# Patient Record
Sex: Female | Born: 1935 | Race: White | Hispanic: No | Smoking: Former smoker
Health system: Southern US, Community
[De-identification: ages and names within clinical notes are randomized; demographics above are authoritative.]

## PROBLEM LIST (undated history)

## (undated) DIAGNOSIS — K922 Gastrointestinal hemorrhage, unspecified: Secondary | ICD-10-CM

## (undated) DIAGNOSIS — E8881 Metabolic syndrome: Secondary | ICD-10-CM

## (undated) DIAGNOSIS — J302 Other seasonal allergic rhinitis: Secondary | ICD-10-CM

## (undated) DIAGNOSIS — Z9289 Personal history of other medical treatment: Secondary | ICD-10-CM

## (undated) DIAGNOSIS — E785 Hyperlipidemia, unspecified: Secondary | ICD-10-CM

## (undated) DIAGNOSIS — R51 Headache: Secondary | ICD-10-CM

## (undated) DIAGNOSIS — I447 Left bundle-branch block, unspecified: Secondary | ICD-10-CM

## (undated) DIAGNOSIS — R911 Solitary pulmonary nodule: Secondary | ICD-10-CM

## (undated) DIAGNOSIS — K219 Gastro-esophageal reflux disease without esophagitis: Secondary | ICD-10-CM

## (undated) DIAGNOSIS — I519 Heart disease, unspecified: Secondary | ICD-10-CM

## (undated) DIAGNOSIS — Z950 Presence of cardiac pacemaker: Secondary | ICD-10-CM

## (undated) DIAGNOSIS — I428 Other cardiomyopathies: Secondary | ICD-10-CM

## (undated) DIAGNOSIS — E88819 Insulin resistance, unspecified: Secondary | ICD-10-CM

## (undated) DIAGNOSIS — Z8679 Personal history of other diseases of the circulatory system: Secondary | ICD-10-CM

## (undated) DIAGNOSIS — I517 Cardiomegaly: Secondary | ICD-10-CM

## (undated) DIAGNOSIS — M1990 Unspecified osteoarthritis, unspecified site: Secondary | ICD-10-CM

## (undated) DIAGNOSIS — I10 Essential (primary) hypertension: Secondary | ICD-10-CM

## (undated) DIAGNOSIS — I639 Cerebral infarction, unspecified: Secondary | ICD-10-CM

## (undated) HISTORY — DX: Hyperlipidemia, unspecified: E78.5

## (undated) HISTORY — DX: Solitary pulmonary nodule: R91.1

## (undated) HISTORY — DX: Metabolic syndrome: E88.81

## (undated) HISTORY — DX: Other seasonal allergic rhinitis: J30.2

## (undated) HISTORY — PX: INSERT / REPLACE / REMOVE PACEMAKER: SUR710

## (undated) HISTORY — DX: Left bundle-branch block, unspecified: I44.7

## (undated) HISTORY — PX: CHOLECYSTECTOMY: SHX55

## (undated) HISTORY — DX: Heart disease, unspecified: I51.9

## (undated) HISTORY — PX: JOINT REPLACEMENT: SHX530

## (undated) HISTORY — DX: Essential (primary) hypertension: I10

## (undated) HISTORY — DX: Gastro-esophageal reflux disease without esophagitis: K21.9

## (undated) HISTORY — DX: Cerebral infarction, unspecified: I63.9

## (undated) HISTORY — DX: Other cardiomyopathies: I42.8

## (undated) HISTORY — DX: Cardiomegaly: I51.7

## (undated) HISTORY — DX: Headache: R51

## (undated) HISTORY — PX: OTHER SURGICAL HISTORY: SHX169

## (undated) HISTORY — DX: Insulin resistance, unspecified: E88.819

## (undated) HISTORY — PX: CARDIOVASCULAR STRESS TEST: SHX262

## (undated) HISTORY — DX: Personal history of other diseases of the circulatory system: Z86.79

## (undated) HISTORY — DX: Personal history of other medical treatment: Z92.89

## (undated) HISTORY — DX: Unspecified osteoarthritis, unspecified site: M19.90

## (undated) HISTORY — DX: Gastrointestinal hemorrhage, unspecified: K92.2

---
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
## 1961-03-17 HISTORY — PX: ABDOMINAL HYSTERECTOMY: SHX81

## 1998-07-08 ENCOUNTER — Emergency Department (HOSPITAL_COMMUNITY): Admission: EM | Admit: 1998-07-08 | Discharge: 1998-07-08 | Payer: Self-pay | Admitting: Emergency Medicine

## 1998-07-16 ENCOUNTER — Emergency Department (HOSPITAL_COMMUNITY): Admission: EM | Admit: 1998-07-16 | Discharge: 1998-07-16 | Payer: Self-pay | Admitting: Emergency Medicine

## 2000-03-17 ENCOUNTER — Encounter: Payer: Self-pay | Admitting: Emergency Medicine

## 2000-03-17 ENCOUNTER — Emergency Department (HOSPITAL_COMMUNITY): Admission: EM | Admit: 2000-03-17 | Discharge: 2000-03-17 | Payer: Self-pay | Admitting: Emergency Medicine

## 2000-04-03 ENCOUNTER — Ambulatory Visit (HOSPITAL_COMMUNITY): Admission: RE | Admit: 2000-04-03 | Discharge: 2000-04-03 | Payer: Self-pay | Admitting: *Deleted

## 2000-04-03 ENCOUNTER — Encounter: Payer: Self-pay | Admitting: *Deleted

## 2005-08-05 ENCOUNTER — Emergency Department (HOSPITAL_COMMUNITY): Admission: EM | Admit: 2005-08-05 | Discharge: 2005-08-05 | Payer: Self-pay | Admitting: Emergency Medicine

## 2009-08-14 ENCOUNTER — Emergency Department (HOSPITAL_BASED_OUTPATIENT_CLINIC_OR_DEPARTMENT_OTHER)
Admission: EM | Admit: 2009-08-14 | Discharge: 2009-08-14 | Payer: Self-pay | Source: Home / Self Care | Admitting: Emergency Medicine

## 2009-08-14 ENCOUNTER — Ambulatory Visit: Payer: Self-pay | Admitting: Diagnostic Radiology

## 2010-07-09 ENCOUNTER — Ambulatory Visit: Payer: Self-pay | Admitting: Internal Medicine

## 2011-05-28 ENCOUNTER — Encounter: Payer: Self-pay | Admitting: Family Medicine

## 2011-05-28 ENCOUNTER — Ambulatory Visit (INDEPENDENT_AMBULATORY_CARE_PROVIDER_SITE_OTHER): Payer: PRIVATE HEALTH INSURANCE | Admitting: Family Medicine

## 2011-05-28 VITALS — BP 175/83 | HR 87 | Temp 98.3°F | Ht 60.0 in | Wt 148.1 lb

## 2011-05-28 DIAGNOSIS — Z23 Encounter for immunization: Secondary | ICD-10-CM

## 2011-05-28 DIAGNOSIS — I10 Essential (primary) hypertension: Secondary | ICD-10-CM

## 2011-05-28 DIAGNOSIS — E119 Type 2 diabetes mellitus without complications: Secondary | ICD-10-CM

## 2011-05-28 MED ORDER — TETANUS-DIPHTH-ACELL PERTUSSIS 5-2.5-18.5 LF-MCG/0.5 IM SUSP: 0.5000 mL | Freq: Once | INTRAMUSCULAR | Status: DC

## 2011-05-29 ENCOUNTER — Other Ambulatory Visit (INDEPENDENT_AMBULATORY_CARE_PROVIDER_SITE_OTHER): Payer: PRIVATE HEALTH INSURANCE

## 2011-05-29 DIAGNOSIS — E119 Type 2 diabetes mellitus without complications: Secondary | ICD-10-CM

## 2011-05-29 DIAGNOSIS — I10 Essential (primary) hypertension: Secondary | ICD-10-CM

## 2011-05-29 LAB — COMPREHENSIVE METABOLIC PANEL
ALT: 13 U/L (ref 0–35)
Albumin: 4.3 g/dL (ref 3.5–5.2)
CO2: 28 mEq/L (ref 19–32)
Calcium: 9.3 mg/dL (ref 8.4–10.5)
Chloride: 104 mEq/L (ref 96–112)
Creatinine, Ser: 0.9 mg/dL (ref 0.4–1.2)
GFR: 61.58 mL/min (ref >60.00)
Potassium: 4.3 mEq/L (ref 3.5–5.1)

## 2011-05-29 LAB — LIPID PANEL
Cholesterol: 208 mg/dL — ABNORMAL HIGH (ref 0–200)
Total CHOL/HDL Ratio: 3

## 2011-05-29 LAB — TSH: TSH: 2.18 u[IU]/mL (ref 0.35–5.50)

## 2011-05-29 LAB — CBC WITH DIFFERENTIAL/PLATELET
Basophils Absolute: 0 10*3/uL (ref 0.0–0.1)
Eosinophils Absolute: 0.3 10*3/uL (ref 0.0–0.7)
Lymphocytes Relative: 29.1 % (ref 12.0–46.0)
MCHC: 33.1 g/dL (ref 30.0–36.0)
Monocytes Absolute: 0.5 10*3/uL (ref 0.1–1.0)
Neutro Abs: 4.6 10*3/uL (ref 1.4–7.7)
Neutrophils Relative %: 61 % (ref 43.0–77.0)
RDW: 14 % (ref 11.5–14.6)

## 2011-05-30 ENCOUNTER — Encounter: Payer: Self-pay | Admitting: Family Medicine

## 2011-05-31 ENCOUNTER — Encounter: Payer: Self-pay | Admitting: Family Medicine

## 2011-05-31 DIAGNOSIS — I10 Essential (primary) hypertension: Secondary | ICD-10-CM | POA: Insufficient documentation

## 2011-05-31 NOTE — Progress Notes (Signed)
Office Note 05/31/2011  CC:  Chief Complaint  Patient presents with  . Establish Care    new patient    HPI:  Madison White is a 79 y.o. White female who is here to establish care. Patient's most recent primary MD: Dr. Dorothe Pea at [facility]. Old records were not reviewed prior to or during today's visit, except for a couple of typed pages that the patient brought--she wrote down some facts about her allergies, some mention of meds, some glucose levels but no mention of any dates..  Pt here today with one acute complaint: some focal pain on bottom of right foot, hx of callus in this area that has been shaved down before.  No other feet pain or sensation abnormality.  Actually, she goes on to say that it has been hurting lately but the last 2d has stopped hurting completely.  Patient spent large majority of today's visit talking about her distrust of her daughter in law, says she is a "sociopath" and Pt wants to be sure we never let her get medical info from [place] about her, says she has impersonated others in the past just to do this.  She perseverates about this, tells multiple stories that outline some of the things the daughter in law has done, says it has really caused her a lot of stress but she thinks that overall she is dealing with it pretty well.    Past Medical History  Diagnosis Date  . Diabetes mellitus     unclear per pt: awaiting old records as of 05/28/11  . HTN (hypertension)   . Hyperlipidemia   . GERD (gastroesophageal reflux disease)   . History of rheumatic fever   . History of blood transfusion   . Seasonal allergies   . Osteoarthritis     Past Surgical History  Procedure Date  . Cesarean section X 5    One of her neonates died soon after birth  . Strabismus surgery 1939  . Tonsillectomy 1938  . Abdominal hysterectomy 1963    At the time of her last C/S; says she had partial bladder resection at that time as well    Family History  Problem Relation Age of  Onset  . Hypertension Mother   . Heart disease Mother   . Hyperlipidemia Mother   . Diabetes Mother   . Heart disease Father     History   Social History  . Marital Status: Widowed    Spouse Name: N/A    Number of Children: N/A  . Years of Education: N/A   Occupational History  . Not on file.   Social History Main Topics  . Smoking status: Former Smoker    Quit date: 05/31/1958  . Smokeless tobacco: Never Used  . Alcohol Use: Not on file  . Drug Use: Not on file  . Sexually Active: Not on file   Other Topics Concern  . Not on file   Social History Narrative   Widower, husband died around 59 (wrongful death per pt's report).Originally from [place], has been in [place] since about 1997.Lives alone.  Former Airline pilot and lay pastor-retired.  Enjoys Engineer, materials.Fairly active and independent.Distant hx of tobacco abuse.  No alcohol or drugs.    MEDS: Centrum silver MVI qd, Vit B12 po qd, Vit D 400 IU qd, Ocuvite, omega 3 tabs. Her records she brought mention toprol XL, avapro, and simvastatin but she does not list these as current meds when discussing things with me or  the nurse.    Allergies  Allergen Reactions  . Lisinopril Swelling    Swelling around eyes; also says it caused increased sugar and BP  . Penicillins Hives  . Sulfa Antibiotics Rash    ROS Review of Systems  Constitutional: Negative for fever and fatigue.  HENT: Negative for congestion and sore throat.   Eyes: Negative for visual disturbance.  Respiratory: Negative for cough.   Cardiovascular: Negative for chest pain.  Gastrointestinal: Negative for nausea and abdominal pain.  Genitourinary: Negative for dysuria.  Musculoskeletal: Positive for arthralgias (about 74mo of left knee pain that spontaneously resolved recently per pt). Negative for back pain and joint swelling.  Skin: Negative for rash.  Neurological: Negative for weakness and headaches.  Hematological: Negative for adenopathy.     PE; Blood pressure 175/83, pulse 87, temperature 98.3 F (36.8 C), temperature source Temporal, height 5' (1.524 m), weight 148 lb 1.9 oz (67.187 kg), SpO2 95.00%. Gen: Alert, well appearing.  Patient is oriented to person, place, time, and situation. ENT: Ears: EACs clear, normal epithelium.  TMs with good light reflex and landmarks bilaterally.  Eyes: no injection, icteris, swelling, or exudate.  EOMI, PERRLA. Nose: no drainage or turbinate edema/swelling.  No injection or focal lesion.  Mouth: lips without lesion/swelling.  Oral mucosa pink and moist.  Dentition intact and without obvious caries or gingival swelling.  Oropharynx without erythema, exudate, or swelling.  Neck - No masses or thyromegaly or limitation in range of motion CV: RRR, no m/r/g.   LUNGS: CTA bilat, nonlabored resps, good aeration in all lung fields. ABD: soft, NT, ND, BS normal.  No hepatospenomegaly or mass.  No bruits. EXT: no clubbing, cyanosis, or edema.  Right foot: callus noted at area of 4th metatarsal head, no obvious sign of plantar wart beneath this.  Nontender to touch.  No erythema, blanching, or fluctuance.  Pertinent labs:  None today  ASSESSMENT AND PLAN:   HTN (hypertension), benign History and treatment unclear, patient not able to clarify today. Will obtain old records, have her check home bps and will obtain fasting labs (lipid panel, CMET, CBC, TSH, and HbA1c) ASAP. She declined flu vaccine today. She was agreeable to Tdap, pneumovax, and zostavax but we never got to this b/c of her perseveration regarding her daughter in law.    Spent 45 min with pt today and >50% of this time was spent in counseling and addressing her concerns about her stress dealing with her daughter in law. Hopefully, upon her return we can further clarify med treatment for HTN and hyperlipidemia that is confusing in her "home" records.  Return for lab visit for fasting labs ASAP; o/v with me in 1 wk.

## 2011-05-31 NOTE — Assessment & Plan Note (Signed)
History and treatment unclear, patient not able to clarify today. Will obtain old records, have her check home bps and will obtain fasting labs (lipid panel, CMET, CBC, TSH, and HbA1c) ASAP. She declined flu vaccine today. She was agreeable to Tdap, pneumovax, and zostavax but we never got to this b/c of her perseveration regarding her daughter in law.

## 2011-06-06 ENCOUNTER — Encounter: Payer: Self-pay | Admitting: Family Medicine

## 2011-06-06 ENCOUNTER — Ambulatory Visit (INDEPENDENT_AMBULATORY_CARE_PROVIDER_SITE_OTHER): Payer: PRIVATE HEALTH INSURANCE | Admitting: Family Medicine

## 2011-06-06 VITALS — BP 157/82 | HR 87 | Temp 98.4°F | Ht 60.0 in | Wt 149.0 lb

## 2011-06-06 DIAGNOSIS — I10 Essential (primary) hypertension: Secondary | ICD-10-CM

## 2011-06-06 NOTE — Patient Instructions (Signed)
Your goal blood pressure is <140 on top and <90 on bottom. Check your bp at home 3 times per week over the next 58mo. Bring these back for review in my office If persistently > 170 on top or >110 on bottom, return or call before your 58mo f/u.

## 2011-06-06 NOTE — Progress Notes (Signed)
OFFICE VISIT  06/10/2011   CC:  Chief Complaint  Patient presents with  . Follow-up    DM, HTN     HPI:    Patient is a 78 y.o. Caucasian female who presents for f/u HTN. Labs done last week were all normal, including HbA1c 5.8%. Tried to discuss past dx/treatment of htn with pt but it was hard to get any details out of her b/c she kept repeating the same 2-3 stories she told last visit about a "sociopath" daughter in law, a "wrongful death" case involving her husband, and a distressing accusation by a psychiatrist that she was making everything up.  She was able to reiterate that she does think that stress of dealing with her daughter in law has made her bp go up, but she didn't have any explanation how she got put on meds for bp at some point in the past. Her reactions to these meds are unclear, but seem to be ? Hyperglycemia? And/or rash.  Also, she is convinced that many medications that are not sulfa drugs have sulfa in them and she is therefore very apprehensive about the thought of having to take them.   Past Medical History  Diagnosis Date  . Diabetes mellitus     unclear per pt: awaiting old records as of 05/28/11  . HTN (hypertension)   . Hyperlipidemia   . GERD (gastroesophageal reflux disease)   . History of rheumatic fever   . History of blood transfusion   . Seasonal allergies   . Osteoarthritis     Past Surgical History  Procedure Date  . Cesarean section X 5    One of her neonates died soon after birth  . Strabismus surgery 1939  . Tonsillectomy 1938  . Abdominal hysterectomy 1963    At the time of her last C/S; says she had partial bladder resection at that time as well    Outpatient Prescriptions Prior to Visit  Medication Sig Dispense Refill  . cholecalciferol (VITAMIN D) 400 UNITS TABS Take by mouth.      . Cyanocobalamin (VITAMIN B-12 CR PO) Take by mouth.      . fish oil-omega-3 fatty acids 1000 MG capsule Take 2 g by mouth daily.      . Multiple  Vitamins-Minerals (CENTRUM PO) Take by mouth.      . multivitamin-lutein (OCUVITE-LUTEIN) CAPS Take 1 capsule by mouth daily.        Allergies  Allergen Reactions  . Avapro (Irbesartan) Other (See Comments)    Unknown rxn  . Lisinopril Swelling    Swelling around eyes; also says it caused increased sugar and BP  . Penicillins Hives  . Sulfa Antibiotics Rash    ROS As per HPI  PE: Blood pressure 157/82, pulse 87, temperature 98.4 F (36.9 C), temperature source Temporal, height 5' (1.524 m), weight 149 lb (67.586 kg). Gen: Alert, well appearing.  Patient is oriented to person, place, time, and situation. Affect: pleasant. No further exam today.  LABS:  Lab Results  Component Value Date   TSH 2.18 05/29/2011   Lab Results  Component Value Date   WBC 7.5 05/29/2011   HGB 14.7 05/29/2011   HCT 44.5 05/29/2011   MCV 89.3 05/29/2011   PLT 302.0 05/29/2011   Lab Results  Component Value Date   CREATININE 0.9 05/29/2011   BUN 13 05/29/2011   NA 142 05/29/2011   K 4.3 05/29/2011   CL 104 05/29/2011   CO2 28 05/29/2011  Lab Results  Component Value Date   ALT 13 05/29/2011   AST 24 05/29/2011   ALKPHOS 43 05/29/2011   BILITOT 0.3 05/29/2011   Lab Results  Component Value Date   CHOL 208* 05/29/2011   Lab Results  Component Value Date   HDL 70.40 05/29/2011   No results found for this basename: [facility]   Lab Results  Component Value Date   TRIG 93.0 05/29/2011   Lab Results  Component Value Date   CHOLHDL 3 05/29/2011   No results found for this basename: PSA       IMPRESSION AND PLAN:  HTN (hypertension), benign Discussed HTN thoroughly today and the importance of correctly diagnosing it and correctly treating it. Decided to do home bp monitoring with logging of these numbers with HR and bring them in to next f/u visit in a couple of weeks. No meds started at this time. Still awaiting records from prior PCP.     FOLLOW UP: Return in about 3 months (around  09/06/2011).

## 2011-06-10 NOTE — Assessment & Plan Note (Addendum)
Discussed HTN thoroughly today and the importance of correctly diagnosing it and correctly treating it. Decided to do home bp monitoring with logging of these numbers with HR and bring them in to next f/u visit. BP goal written down for her and parameters for early call back or return were clearly delineated for her. No meds started at this time. Still awaiting records from prior PCP.

## 2011-07-07 ENCOUNTER — Telehealth: Payer: Self-pay | Admitting: Family Medicine

## 2011-07-07 NOTE — Telephone Encounter (Signed)
Left a message for patient to return my call. 

## 2011-07-07 NOTE — Telephone Encounter (Signed)
Patient advised and appt made for 07-14-11

## 2011-07-07 NOTE — Telephone Encounter (Signed)
Patient is old enough that some time in the next couple of weeks at her convenience we should probably check her bp and get an EKG, as long as she is asymptomatic, (not feeling palpitations, sob, passing out etc), she can choose to wait for Dr Milinda Cave or come in and see me

## 2011-07-14 ENCOUNTER — Ambulatory Visit (INDEPENDENT_AMBULATORY_CARE_PROVIDER_SITE_OTHER): Payer: Medicare Other | Admitting: Family Medicine

## 2011-07-14 ENCOUNTER — Encounter: Payer: Self-pay | Admitting: Family Medicine

## 2011-07-14 VITALS — BP 142/72 | HR 90 | Ht 60.0 in | Wt 149.0 lb

## 2011-07-14 DIAGNOSIS — I10 Essential (primary) hypertension: Secondary | ICD-10-CM

## 2011-07-14 NOTE — Progress Notes (Signed)
OFFICE NOTE  07/14/2011  CC:  Chief Complaint  Patient presents with  . Irregular Heart Beat    seen on home BP monitor     HPI:   Patient is a 78 y.o. Caucasian female who is here for c/o "irregular heart rate". She has been monitoring bp and HR at home several times per week since I last saw her about 5 wks ago. I reviewed these today and most systolics are mildly elevated and most diastolics are 70s-80s (one was 95). Still under a lot of stress with family situations, just started vigorous exercise regimen, also paying more attention to heart healthy diet. Denies CP, palpitations, SOB, dizziness, HA, or LE swelling.    Pertinent PMH:  Past Medical History  Diagnosis Date  . Diabetes mellitus     unclear per pt: awaiting old records as of 05/28/11  . HTN (hypertension)   . Hyperlipidemia   . GERD (gastroesophageal reflux disease)   . History of rheumatic fever   . History of blood transfusion   . Seasonal allergies   . Osteoarthritis     MEDS;   Outpatient Prescriptions Prior to Visit  Medication Sig Dispense Refill  . cholecalciferol (VITAMIN D) 400 UNITS TABS Take by mouth.      . Cyanocobalamin (VITAMIN B-12 CR PO) Take by mouth.      . fish oil-omega-3 fatty acids 1000 MG capsule Take 2 g by mouth daily.      . Multiple Vitamins-Minerals (CENTRUM PO) Take by mouth.      . multivitamin-lutein (OCUVITE-LUTEIN) CAPS Take 1 capsule by mouth daily.        PE: Blood pressure 142/72, pulse 90, height 5' (1.524 m), weight 149 lb (67.586 kg), SpO2 97.00%. Gen: Alert, well appearing.  Patient is oriented to person, place, time, and situation. CV: Heart sounds distant but no murmur, rub, or gallop appreciated.  S1 and S2 are fine.  Rhythm regular.  Pulses symmetric.  LUNGS: CTA bilat, nonlabored resps, good aeration in all lung fields.   IMPRESSION AND PLAN:  HTN (hypertension), benign She insists on trying TLC/wt loss for 54mo before med trial, even though I warned  her that her bp was likely too high for TLC to bring it back into normal range.  She's apprehensive about possible drug rxn b/c of past experiences. Monitor blood pressure every other day.  Your goal BP is <140/90. If you have several bp's in a row that are greater than 165 on top or >110 on bottom then call the office or return prior to next scheduled appt.      FOLLOW UP:  Return in about 3 months (around 10/13/2011) for f/u HTN.

## 2011-07-14 NOTE — Patient Instructions (Signed)
Monitor blood pressure every other day.  Your goal BP is <140/90. If you have several bp's in a row that are greater than 165 on top or >110 on bottom then call the office or return prior to next scheduled appt.

## 2011-07-14 NOTE — Assessment & Plan Note (Addendum)
She insists on trying TLC/wt loss for 69mo before med trial, even though I warned her that her bp was likely too high for TLC to bring it back into normal range.  She's apprehensive about possible drug rxn b/c of past experiences. Monitor blood pressure every other day.  Your goal BP is <140/90. If you have several bp's in a row that are greater than 165 on top or >110 on bottom then call the office or return prior to next scheduled appt.

## 2011-07-20 ENCOUNTER — Encounter: Payer: Self-pay | Admitting: Family Medicine

## 2011-08-14 HISTORY — PX: CATARACT EXTRACTION: SUR2

## 2011-09-05 ENCOUNTER — Ambulatory Visit: Payer: PRIVATE HEALTH INSURANCE | Admitting: Family Medicine

## 2011-09-08 ENCOUNTER — Other Ambulatory Visit: Payer: Self-pay | Admitting: Family Medicine

## 2011-09-08 ENCOUNTER — Ambulatory Visit (INDEPENDENT_AMBULATORY_CARE_PROVIDER_SITE_OTHER): Payer: Medicare Other | Admitting: Family Medicine

## 2011-09-08 ENCOUNTER — Encounter: Payer: Self-pay | Admitting: Family Medicine

## 2011-09-08 VITALS — BP 173/83 | HR 81 | Ht 60.0 in | Wt 152.0 lb

## 2011-09-08 DIAGNOSIS — I10 Essential (primary) hypertension: Secondary | ICD-10-CM

## 2011-09-08 DIAGNOSIS — Z23 Encounter for immunization: Secondary | ICD-10-CM

## 2011-09-08 NOTE — Assessment & Plan Note (Signed)
Her home readings support isolated systolic HTN, rare high diastolic. She is once again averse to med trial at this time and wants to try diet and exercise for 3 mo first. I told her this was fine as long as she understands the risk of stroke and other CV disease that she may have from untreated HTN. She expressed understanding of this.  We went over the DASH diet and I gave handout of it today.

## 2011-09-08 NOTE — Progress Notes (Signed)
OFFICE VISIT  09/08/2011   CC:  Chief Complaint  Patient presents with  . Follow-up     HPI:    Patient is a 78 y.o. Caucasian female who presents for  3 mo f/u HTN.  She is currently on no meds and was supposed to do lifestyle mod x 36mo b/c this is what she wanted to try--has had bad experience with a couple of meds in the past. BP "erratic" at home.  Reviewed log of bp's she brought in today and it shows systolic typically 140s to 170s and diastolics 60s-70s, rarely over 90.  Heart rate 60s-80s.   Callus problem on bottom of right foot has prevented her from getting started with regular physical exercise.  She says her podiatrist, Dr. Forest Becker, shaved this twice since I saw her last.  She joined [employer] and won a poetry contest there.  She describes more stress in her life lately b/c her daughter has moved back in with her.  She has a good relationship with her daughter but it is still more stress to live with someone.  ROS: no CP, no palpitations, no dizziness, no neck or back pain.  Neck "popped" when she was put on the table for her cataract surgery recently, but she denies pain in the neck, denies UE paresthesias or weakness.  Past Medical History  Diagnosis Date  . Insulin resistance     A1c's excellent per old records (6.1 in 2008 and 2009)  . HTN (hypertension)     hx of refusing treatment  . Hyperlipidemia     hx of refusing treatment  . GERD (gastroesophageal reflux disease)   . History of rheumatic fever   . History of blood transfusion   . Seasonal allergies   . Osteoarthritis     Past Surgical History  Procedure Date  . Cesarean section X 5    One of her neonates died soon after birth  . Strabismus surgery 1939  . Tonsillectomy 1938  . Abdominal hysterectomy 1963    At the time of her last C/S; says she had partial bladder resection at that time as well  . Cardiovascular stress test 08/2005    Low risk scan  . Cataract extraction 08/14/11    both      Outpatient Prescriptions Prior to Visit  Medication Sig Dispense Refill  . Cyanocobalamin (VITAMIN B-12 CR PO) Take by mouth.      . fish oil-omega-3 fatty acids 1000 MG capsule Take 2 g by mouth daily.      . Multiple Vitamins-Minerals (CENTRUM PO) Take by mouth.      . cholecalciferol (VITAMIN D) 400 UNITS TABS Take by mouth.      . multivitamin-lutein (OCUVITE-LUTEIN) CAPS Take 1 capsule by mouth daily.        Allergies  Allergen Reactions  . Avapro (Irbesartan) Other (See Comments)    Unknown rxn  . Lisinopril Swelling    Swelling around eyes; also says it caused increased sugar and BP  . Penicillins Hives  . Sulfa Antibiotics Rash    ROS As per HPI  PE: Blood pressure 173/83, pulse 81, height 5' (1.524 m), weight 152 lb (68.947 kg). Gen: Alert, well appearing.  Patient is oriented to person, place, time, and situation. Neck: supple/nontender.  No LAD, mass, or TM.  Carotid pulses 2+ bilaterally, without bruits. CV: RRR, no m/r/g.   LUNGS: CTA bilat, nonlabored resps, good aeration in all lung fields. ABD: soft, NT/ND, no bruit. EXT:  no clubbing, cyanosis, or edema.   LABS:  Lab Results  Component Value Date   WBC 7.5 05/29/2011   HGB 14.7 05/29/2011   HCT 44.5 05/29/2011   MCV 89.3 05/29/2011   PLT 302.0 05/29/2011     Chemistry      Component Value Date/Time   NA 142 05/29/2011 0810   K 4.3 05/29/2011 0810   CL 104 05/29/2011 0810   CO2 28 05/29/2011 0810   BUN 13 05/29/2011 0810   CREATININE 0.9 05/29/2011 0810      Component Value Date/Time   CALCIUM 9.3 05/29/2011 0810   ALKPHOS 43 05/29/2011 0810   AST 24 05/29/2011 0810   ALT 13 05/29/2011 0810   BILITOT 0.3 05/29/2011 0810     Lab Results  Component Value Date   CHOL 208* 05/29/2011   HDL 70.40 05/29/2011   LDLDIRECT 117.9 05/29/2011   TRIG 93.0 05/29/2011   CHOLHDL 3 05/29/2011   Lab Results  Component Value Date   HGBA1C 5.8 05/29/2011     IMPRESSION AND PLAN:  HTN (hypertension), benign Her  home readings support isolated systolic HTN, rare high diastolic. She is once again averse to med trial at this time and wants to try diet and exercise for 3 mo first. I told her this was fine as long as she understands the risk of stroke and other CV disease that she may have from untreated HTN. She expressed understanding of this.  We went over the DASH diet and I gave handout of it today.    FOLLOW UP: Return in about 3 months (around 12/09/2011) for morning appt so she can get fasting labs done after seeing me.

## 2011-09-08 NOTE — Addendum Note (Signed)
Addended by: Luisa Dago on: 09/08/2011 02:07 PM   Modules accepted: Orders

## 2011-12-08 ENCOUNTER — Ambulatory Visit: Payer: Medicare Other | Admitting: Family Medicine

## 2011-12-16 DIAGNOSIS — I447 Left bundle-branch block, unspecified: Secondary | ICD-10-CM

## 2011-12-16 HISTORY — DX: Left bundle-branch block, unspecified: I44.7

## 2011-12-18 ENCOUNTER — Ambulatory Visit (INDEPENDENT_AMBULATORY_CARE_PROVIDER_SITE_OTHER): Payer: Medicare Other | Admitting: Family Medicine

## 2011-12-18 ENCOUNTER — Encounter: Payer: Self-pay | Admitting: Family Medicine

## 2011-12-18 VITALS — BP 151/75 | HR 78 | Ht 60.0 in | Wt 149.0 lb

## 2011-12-18 DIAGNOSIS — R002 Palpitations: Secondary | ICD-10-CM

## 2011-12-18 DIAGNOSIS — I10 Essential (primary) hypertension: Secondary | ICD-10-CM

## 2011-12-18 DIAGNOSIS — I447 Left bundle-branch block, unspecified: Secondary | ICD-10-CM | POA: Insufficient documentation

## 2011-12-18 NOTE — Assessment & Plan Note (Signed)
New dx, asymptomatic. I have spoken to [facility] today and they will call the pt ASAP for an appt. I will fax my note from today and her EKG to their office.

## 2011-12-18 NOTE — Progress Notes (Signed)
OFFICE NOTE  12/18/2011  CC:  Chief Complaint  Patient presents with  . Follow-up    hypertension     HPI: Patient is a 78 y.o. Caucasian female who is here for 4 mo f/u HTN.  She has been resistant to med use for this condition.  Has put it off twice in order to try to do TLC and see if bp would come down. She has noted normal bps when consistently exercising.  She notes an abnormal rhythm when she lies down to go to sleep during the evening.  Just the past couple of nights.  No CP or SOB. Under more stress lately due to her daughter living with her.   Pertinent PMH:  Past Medical History  Diagnosis Date  . Insulin resistance     A1c's excellent per old records (6.1 in 2008 and 2009)  . HTN (hypertension)     hx of refusing treatment  . Hyperlipidemia     hx of refusing treatment  . GERD (gastroesophageal reflux disease)   . History of rheumatic fever   . History of blood transfusion   . Seasonal allergies   . Osteoarthritis     MEDS:  Outpatient Prescriptions Prior to Visit  Medication Sig Dispense Refill  . Calcium Carbonate-Vitamin D (CALCIUM 600 + D PO) Take 1 tablet by mouth daily.      . Cyanocobalamin (VITAMIN B-12 CR PO) Take by mouth.      . fish oil-omega-3 fatty acids 1000 MG capsule Take 2 g by mouth daily.      . Multiple Vitamins-Minerals (CENTRUM PO) Take by mouth.      . BESIVANCE 0.6 % SUSP Place 1 drop into the left eye Twice daily.      . Bromfenac Sodium (PROLENSA) 0.07 % SOLN Place 1 drop into the right eye daily.      . DUREZOL 0.05 % EMUL Place 1 drop into the left eye Twice daily.        PE: Blood pressure 151/75, pulse 78, height 5' (1.524 m), weight 149 lb (67.586 kg). Gen: Alert, well appearing.  Patient is oriented to person, place, time, and situation. CV: Regular with frequent pauses/ectopy vs irreg rhythm, no murmur/rub/gallop LUNGS: CTA bilat, nonlabored resps. EXT: no clubbing, cyanosis, or edema.   12 LEAD EKG: sinus rhythm, 1st  degree A-V block, LBBB and left axis.  No old EKG for comparison.  IMPRESSION AND PLAN:  Left bundle branch block New dx, asymptomatic. I have spoken to [facility] today and they will call the pt ASAP for an appt. I will fax my note from today and her EKG to their office.  HTN (hypertension), benign A bit up here today as usual but pt says normal at home with regular exercise. She has been adamantly opposed to meds in the past. Continue with TLC.     FOLLOW UP: 1 mo

## 2011-12-18 NOTE — Assessment & Plan Note (Signed)
A bit up here today as usual but pt says normal at home with regular exercise. She has been adamantly opposed to meds in the past. Continue with TLC.

## 2012-01-13 HISTORY — PX: TRANSTHORACIC ECHOCARDIOGRAM: SHX275

## 2012-01-19 ENCOUNTER — Encounter: Payer: Self-pay | Admitting: Family Medicine

## 2012-01-21 ENCOUNTER — Encounter: Payer: Self-pay | Admitting: Family Medicine

## 2012-06-06 ENCOUNTER — Encounter: Payer: Self-pay | Admitting: *Deleted

## 2012-06-17 ENCOUNTER — Encounter: Payer: Self-pay | Admitting: Family Medicine

## 2012-06-17 ENCOUNTER — Ambulatory Visit (INDEPENDENT_AMBULATORY_CARE_PROVIDER_SITE_OTHER): Payer: Medicare Other | Admitting: Family Medicine

## 2012-06-17 VITALS — BP 140/78 | HR 88 | Ht 60.0 in | Wt 156.0 lb

## 2012-06-17 DIAGNOSIS — R5381 Other malaise: Secondary | ICD-10-CM

## 2012-06-17 DIAGNOSIS — R5383 Other fatigue: Secondary | ICD-10-CM

## 2012-06-17 DIAGNOSIS — I10 Essential (primary) hypertension: Secondary | ICD-10-CM

## 2012-06-17 LAB — CBC WITH DIFFERENTIAL/PLATELET
Basophils Relative: 0.7 % (ref 0.0–3.0)
Eosinophils Relative: 3 % (ref 0.0–5.0)
Hemoglobin: 14.6 g/dL (ref 12.0–15.0)
Lymphocytes Relative: 27.5 % (ref 12.0–46.0)
MCHC: 33.8 g/dL (ref 30.0–36.0)
MCV: 87.7 fl (ref 78.0–100.0)
Monocytes Absolute: 0.7 10*3/uL (ref 0.1–1.0)
Neutro Abs: 5.4 10*3/uL (ref 1.4–7.7)
Neutrophils Relative %: 61.1 % (ref 43.0–77.0)
RBC: 4.94 Mil/uL (ref 3.87–5.11)
WBC: 8.9 10*3/uL (ref 4.5–10.5)

## 2012-06-17 LAB — BASIC METABOLIC PANEL
BUN: 11 mg/dL (ref 6–23)
Chloride: 103 mEq/L (ref 96–112)
Creatinine, Ser: 0.8 mg/dL (ref 0.4–1.2)
Glucose, Bld: 95 mg/dL (ref 70–99)
Potassium: 4.1 mEq/L (ref 3.5–5.1)

## 2012-06-17 MED ORDER — ASPIRIN 81 MG PO TABS: 81.0000 mg | ORAL_TABLET | Freq: Every day | ORAL | Status: DC

## 2012-06-17 NOTE — Patient Instructions (Signed)
Start 81mg  Aspirin once every day with food. Continue your amlodipine for blood pressure control. For itching, buy OTC allegra 60mg  and take 1 twice a day as needed.

## 2012-06-18 ENCOUNTER — Encounter: Payer: Self-pay | Admitting: Family Medicine

## 2012-06-18 NOTE — Progress Notes (Signed)
OFFICE NOTE  06/18/2012  CC:  Chief Complaint  Patient presents with  . Follow-up    HTN     HPI: Patient is a 79 y.o. Caucasian female who is here for routine HTN f/u. Amlodipine started by cardiologist per pt.   She thinks it is making her itch all over, but her daughters are with her today and they remind her she complained of itching a lot before starting the med.  She says systolics are mildly elevated at home sometimes, sometimes normal. Has not been exercising any lately. She has had a hx of being quite emotionally/mentally resistant to the thought of treating her HTN with meds.  ROS: chronic fatigue.  No CP, SOB, palpitations, or dizziness.  No swelling of lips, tongue, or throat.  No rash.  Pertinent PMH:  Past Medical History  Diagnosis Date  . Insulin resistance     A1c's excellent per old records (6.1 in 2008 and 2009)  . HTN (hypertension)     hx of refusing treatment  . Hyperlipidemia     hx of refusing treatment  . GERD (gastroesophageal reflux disease)   . History of rheumatic fever   . History of blood transfusion   . Seasonal allergies   . Osteoarthritis   . Left bundle branch block 12/2011    Dr. Royann Shivers at [facility] H&V; ECHO and myocardial perfusion scan showed  septal and apical wall motion abnormality but she had no significant valvular disease and no ischemia.  She did have mildly decreased EF (39% on lexiscan and 50% on echo) and abnl LV relaxation.  Mild amount of PVCs.  Pt at higher risk for other conduction abnormalities.  [name] LVH (left ventricular hypertrophy)   . Diastolic dysfunction, left ventricle     MEDS:  Outpatient Prescriptions Prior to Visit  Medication Sig Dispense Refill  . Calcium Carbonate-Vitamin D (CALCIUM 600 + D PO) Take 1 tablet by mouth daily.      . Cyanocobalamin (VITAMIN B-12 CR PO) Take by mouth.      . fish oil-omega-3 fatty acids 1000 MG capsule Take 2 g by mouth daily.      . Multiple Vitamins-Minerals (CENTRUM PO) Take by  mouth.       No facility-administered medications prior to visit.    PE: Blood pressure 140/78, pulse 88, height 5' (1.524 m), weight 156 lb (70.761 kg). Gen: Alert, well appearing.  Patient is oriented to person, place, time, and situation. CV: RRR, soft systolic murmur, no diastolic murmur.  S1 and S2 clear. LUNGS: CTA bilat, nonlabored resps. EXT: Trace bilat LE edema in ankles SKIN: lots of benign age-associated lesions like actinic keratoses, cherry angiomata, lentigo macules. No nevi that appear to be atypical.  IMPRESSION AND PLAN:  HTN: working on it.  She is slowly accepting the thought of med management.  Keep going with amlodipine 5mg  qd and keep f/u with cardiology.  Continue home bp monitoring. I recommended she restart ASA 81mg  qd. Encouraged pt to restart exercise. Check BMET and CBC today. An After Visit Summary was printed and given to the patient.  Spent 30 min with pt today, with >50% of this time spent in counseling and care coordination regarding the above problems.  FOLLOW UP: 1 mo

## 2012-07-05 ENCOUNTER — Encounter: Payer: Self-pay | Admitting: Cardiovascular Disease

## 2012-07-19 ENCOUNTER — Ambulatory Visit (INDEPENDENT_AMBULATORY_CARE_PROVIDER_SITE_OTHER): Payer: Medicare Other | Admitting: Family Medicine

## 2012-07-19 ENCOUNTER — Encounter: Payer: Self-pay | Admitting: Family Medicine

## 2012-07-19 VITALS — BP 138/76 | HR 83 | Temp 98.2°F | Resp 14 | Wt 155.5 lb

## 2012-07-19 DIAGNOSIS — I10 Essential (primary) hypertension: Secondary | ICD-10-CM

## 2012-07-19 NOTE — Patient Instructions (Addendum)
Your blood pressure goal is age top number <140 and avg bottom number <90.  Call for persistent top number > 160, or bottom number >100.

## 2012-07-19 NOTE — Progress Notes (Signed)
OFFICE NOTE  07/19/2012  CC:  Chief Complaint  Patient presents with  . Follow-up    1-mth [HTN]     HPI: Patient is a 79 y.o. Caucasian female who is here for 1 mo f/u HTN.   Says bp was up at her cardiology f/u visit recently but was 147/74 same day at home bp check.  Home checks show avg syst  140s, diast 70s. Denies any HA, fatigue, CP, palpitations, or edema.  Pertinent PMH:  Past Medical History  Diagnosis Date  . Insulin resistance     A1c's excellent per old records (6.1 in 2008 and 2009)  . HTN (hypertension)     hx of refusing treatment  . Hyperlipidemia     hx of refusing treatment  . GERD (gastroesophageal reflux disease)   . History of rheumatic fever   . History of blood transfusion   . Seasonal allergies   . Osteoarthritis   . Left bundle branch block 12/2011    Dr. Royann Shivers at [facility] H&V; ECHO and myocardial perfusion scan showed  septal and apical wall motion abnormality but she had no significant valvular disease and no ischemia.  She did have mildly decreased EF (39% on lexiscan and 50% on echo) and abnl LV relaxation.  Mild amount of PVCs.  Pt at higher risk for other conduction abnormalities.  [name] LVH (left ventricular hypertrophy)   . Diastolic dysfunction, left ventricle     MEDS:  Outpatient Prescriptions Prior to Visit  Medication Sig Dispense Refill  . amLODipine (NORVASC) 5 MG tablet Take 1 tablet by mouth daily.      [name] aspirin 81 MG tablet Take 1 tablet (81 mg total) by mouth daily.  30 tablet  1  . Cyanocobalamin (VITAMIN B-12 CR PO) Take by mouth.      . fish oil-omega-3 fatty acids 1000 MG capsule Take 2 g by mouth daily.      . Multiple Vitamins-Minerals (CENTRUM PO) Take by mouth.      . Calcium Carbonate-Vitamin D (CALCIUM 600 + D PO) Take 1 tablet by mouth daily.       No facility-administered medications prior to visit.    PE: Blood pressure 138/76, pulse 83, temperature 98.2 F (36.8 C), temperature source Oral, resp. rate 14, weight  155 lb 8 oz (70.534 kg), SpO2 95.00%. Gen: Alert, well appearing.  Patient is oriented to person, place, time, and situation. CV: RRR, no m/r/g.  Occ ectopic beat.  Distant S1 and S2. LUNGS: CTA bilat, nonlabored resps, good aeration in all lung fields.   IMPRESSION AND PLAN:  1) HTN, stable.  Continue amlodipine 5mg  qd.  Continue ASA 81mg  qd.  FOLLOW UP: 40mo

## 2012-08-26 ENCOUNTER — Ambulatory Visit: Payer: Medicare Other | Admitting: Cardiovascular Disease

## 2012-09-02 ENCOUNTER — Ambulatory Visit (INDEPENDENT_AMBULATORY_CARE_PROVIDER_SITE_OTHER): Payer: Medicare Other | Admitting: Cardiovascular Disease

## 2012-09-02 ENCOUNTER — Encounter: Payer: Self-pay | Admitting: Cardiovascular Disease

## 2012-09-02 VITALS — BP 162/90 | HR 66 | Resp 20 | Ht 60.0 in | Wt 157.1 lb

## 2012-09-02 DIAGNOSIS — I10 Essential (primary) hypertension: Secondary | ICD-10-CM

## 2012-09-02 DIAGNOSIS — I447 Left bundle-branch block, unspecified: Secondary | ICD-10-CM

## 2012-09-02 DIAGNOSIS — E785 Hyperlipidemia, unspecified: Secondary | ICD-10-CM

## 2012-09-02 NOTE — Assessment & Plan Note (Signed)
She has borderline elevated total cholesterol, slightly elevated LDL cholesterol, but has excellent HDL cholesterol. I do not think pharmacological therapy is needed. Her promise to pursue rigorous physical exercise and lose weight will probably improve these numbers. In a similar fashion her borderline hyperglycemia should also get better. I have asked her to come back in 6 months so that we can analyze the results of her labor.

## 2012-09-02 NOTE — Patient Instructions (Signed)
Your physician recommends that you schedule a follow-up appointment in: 6 months Your physician discussed the importance of regular exercise and recommended that you start or continue a regular exercise program for good health. Your physician encouraged you to lose weight for better health. Sodium-Controlled Diet Sodium is a mineral. It is found in many foods. Sodium may be found naturally or added during the making of a food. The most common form of sodium is salt, which is made up of sodium and chloride. Reducing your sodium intake involves changing your eating habits. The following guidelines will help you reduce the sodium in your diet:  Stop using the salt shaker.  Use salt sparingly in cooking and baking.  Substitute with sodium-free seasonings and spices.  Do not use a salt substitute (potassium chloride) without your caregiver's permission.  Include a variety of fresh, unprocessed foods in your diet.  Limit the use of processed and convenience foods that are high in sodium. USE THE FOLLOWING FOODS SPARINGLY: Breads/Starches  Commercial bread stuffing, commercial pancake or waffle mixes, coating mixes. Waffles. Croutons. Prepared (boxed or frozen) potato, rice, or noodle mixes that contain salt or sodium. Salted [place] fries or hash browns. Salted popcorn, breads, crackers, chips, or snack foods. Vegetables  Vegetables canned with salt or prepared in cream, butter, or cheese sauces. Sauerkraut. Tomato or vegetable juices canned with salt.  Fresh vegetables are allowed if rinsed thoroughly. Fruit  Fruit is okay to eat. Meat and Meat Substitutes  Salted or smoked meats, such as bacon or Canadian bacon, chipped or corned beef, hot dogs, salt pork, luncheon meats, pastrami, ham, or sausage. Canned or smoked fish, poultry, or meat. Processed cheese or cheese spreads, blue or Roquefort cheese. Battered or frozen fish products. Prepared spaghetti sauce. Baked beans. Reuben sandwiches.  Salted nuts. Caviar. Milk  Limit buttermilk to 1 cup per week. Soups and Combination Foods  Bouillon cubes, canned or dried soups, broth, consomm. Convenience (frozen or packaged) dinners with more than 600 mg sodium. Pot pies, pizza, Asian food, fast food cheeseburgers, and specialty sandwiches. Desserts and Sweets  Regular (salted) desserts, pie, commercial fruit snack pies, commercial snack cakes, canned puddings.  Eat desserts and sweets in moderation. Fats and Oils  Gravy mixes or canned gravy. No more than 1 to 2 tbs of salad dressing. Chip dips.  Eat fats and oils in moderation. Beverages  See those listed under the vegetables and milk groups. Condiments  Ketchup, mustard, meat sauces, salsa, regular (salted) and lite soy sauce or mustard. Dill pickles, olives, meat tenderizer. Prepared horseradish or pickle relish. Dutch-processed cocoa. Baking powder or baking soda used medicinally. Worcestershire sauce. "Light" salt. Salt substitute, unless approved by your caregiver. Document Released: 08/23/2001 Document Revised: 05/26/2011 Document Reviewed: 03/26/2009 [facility] Patient Information 2014 [place], [place].

## 2012-09-02 NOTE — Assessment & Plan Note (Signed)
Mrs. Madison White makes a strong argument for "white coat hypertension" is the major reason for her elevated blood pressure. Her blood pressure recordings at home (with a monitor that we have tested in the office) do show frequent systolic blood pressure in the 140-150 mm Hg range, but have never recorded severely elevated blood pressure as we do in the clinic. Mrs. Madison White agrees that when she was walking on a daily basis weight less than today her blood pressure was completely normal. She promises to return to a program of daily exercise, reduce her sodium intake and try to lose some weight. I think this will go a long way towards "curing" her elevated blood pressure. I do not see a compelling reason to keep her on antihypertensive medications especially since she believes these are causing a variety of side effects. I have asked her to continue recording her blood pressure with her home monitor and to send me periodic recordings.

## 2012-09-02 NOTE — Assessment & Plan Note (Signed)
No significant structural heart disease by previous workup with echo and nuclear stress testing.

## 2012-09-02 NOTE — Progress Notes (Signed)
Patient ID: Madison White, female   DOB: 1928/12/07, 79 y.o.   MRN: [id number]      Reason for office visit HTN, hyperlipidemia  Madison White returns today to discuss the need for treatment of high blood pressure and hyperlipidemia medications. She brought her home blood pressure monitor and be tested it against our office sphygmomanometer. It is an accurate device. She has several recordings of her blood pressure at home in the device memory and a typical systolic blood pressures between 135 and 147 mm Hg. The diastolic blood pressures consistently normal. This would indeed confirm her contention that she has "white coat hypertension" for the most part.  She is convinced that many of the medications that we have given her have caused allergic reactions and she is terrified by the complications that she had the 2 allergic reactions to sulfa and penicillin in her youth. While I am not convinced that she is truly allergic to either ACE inhibitors/angiotensin receptor blockers or amlodipine, and she clearly thinks otherwise.    Allergies  Allergen Reactions  . Avapro (Irbesartan) Other (See Comments)    Unknown rxn  . Lisinopril Swelling    Swelling around eyes; also says it caused increased sugar and BP  . Penicillins Hives  . Simvastatin Other (See Comments)    unknown  . Sulfa Antibiotics Rash    Current Outpatient Prescriptions  Medication Sig Dispense Refill  . amLODipine (NORVASC) 5 MG tablet Take 1 tablet by mouth daily.      [name] aspirin 81 MG tablet Take 1 tablet (81 mg total) by mouth daily.  30 tablet  1  . Calcium Carbonate-Vitamin D (CALCIUM 600 + D PO) Take 1 tablet by mouth daily.      . Cyanocobalamin (VITAMIN B-12 CR PO) Take by mouth.      . fexofenadine (ALLEGRA) 180 MG tablet Take 180 mg by mouth daily.      . fish oil-omega-3 fatty acids 1000 MG capsule Take 2 g by mouth daily.      [name] ibuprofen (ADVIL,MOTRIN) 200 MG tablet Take 200 mg by mouth 2 (two) times daily.        . Multiple Vitamins-Minerals (CENTRUM PO) Take by mouth.       No current facility-administered medications for this visit.    Past Medical History  Diagnosis Date  . Insulin resistance     A1c's excellent per old records (6.1 in 2008 and 2009)  . HTN (hypertension)     hx of refusing treatment  . Hyperlipidemia     hx of refusing treatment  . GERD (gastroesophageal reflux disease)   . History of rheumatic fever   . History of blood transfusion   . Seasonal allergies   . Osteoarthritis   . Left bundle branch block 12/2011    Dr. Royann Shivers at [facility] H&V; ECHO and myocardial perfusion scan showed  septal and apical wall motion abnormality but she had no significant valvular disease and no ischemia.  She did have mildly decreased EF (39% on lexiscan and 50% on echo) and abnl LV relaxation.  Mild amount of PVCs.  Pt at higher risk for other conduction abnormalities.  [name] LVH (left ventricular hypertrophy)   . Diastolic dysfunction, left ventricle     Past Surgical History  Procedure Laterality Date  . Cesarean section  X 5    One of her neonates died soon after birth  . Strabismus surgery  1939  . Tonsillectomy  1938  . Abdominal hysterectomy  1963    At the time of her last C/S; says she had partial bladder resection at that time as well  . Cardiovascular stress test  08/2005 & 12/2011    Low risk scans (on the 12/2011 scan she did have EF 39% with moderately severe LV dysfunction with septal dyssynergy probably contributed by LBBB  . Cataract extraction  08/14/11    both  . Transthoracic echocardiogram  01/13/12    Septal dyssynergy, EF 50%, LV relaxation impaired.  No significant valvular abnormalities.    Family History  Problem Relation Age of Onset  . Hypertension Mother   . Heart disease Mother   . Hyperlipidemia Mother   . Diabetes Mother   . Heart disease Father   . Cancer Father     History   Social History  . Marital Status: Widowed    Spouse Name: N/A    Number  of Children: N/A  . Years of Education: N/A   Occupational History  . Not on file.   Social History Main Topics  . Smoking status: Former Smoker    Quit date: 05/31/1958  . Smokeless tobacco: Never Used  . Alcohol Use: Not on file  . Drug Use: Not on file  . Sexually Active: Not on file   Other Topics Concern  . Not on file   Social History Narrative   Widower, husband died around 31 (wrongful death per pt's report).   Originally from [place], has been in [place] since about 1997.   Lives alone.  Former Airline pilot and lay pastor-retired.  Enjoys Engineer, materials.   Fairly active and independent.   Distant hx of tobacco abuse.  No alcohol or drugs.    Review of systems: The patient specifically denies any chest pain at rest or with exertion, dyspnea at rest or with exertion, orthopnea, paroxysmal nocturnal dyspnea, syncope, palpitations, focal neurological deficits, intermittent claudication, lower extremity edema, unexplained weight gain, cough, hemoptysis or wheezing.  The patient also denies abdominal pain, nausea, vomiting, dysphagia, diarrhea, constipation, polyuria, polydipsia, dysuria, hematuria, frequency, urgency, abnormal bleeding or bruising, fever, chills, unexpected weight changes, mood swings, change in skin or hair texture, change in voice quality, auditory or visual problems, allergic reactions or rashes, new musculoskeletal complaints other than usual "aches and pains".   PHYSICAL EXAM BP 162/90  Pulse 66  Resp 20  Ht 5' (1.524 m)  Wt 157 lb 1.6 oz (71.26 kg)  BMI 30.68 kg/m2  General: Alert, oriented x3, no distress Head: no evidence of trauma, PERRL, EOMI, no exophtalmos or lid lag, no myxedema, no xanthelasma; normal ears, nose and oropharynx Neck: normal jugular venous pulsations and no hepatojugular reflux; brisk carotid pulses without delay and no carotid bruits Chest: clear to auscultation, no signs of consolidation by percussion or palpation,  normal fremitus, symmetrical and full respiratory excursions Cardiovascular: normal position and quality of the apical impulse, regular rhythm, normal first and paradoxical split second heart sounds, no murmurs, rubs or gallops Abdomen: no tenderness or distention, no masses by palpation, no abnormal pulsatility or arterial bruits, normal bowel sounds, no hepatosplenomegaly Extremities: no clubbing, cyanosis or edema; 2+ radial, ulnar and brachial pulses bilaterally; 2+ right femoral, posterior tibial and dorsalis pedis pulses; 2+ left femoral, posterior tibial and dorsalis pedis pulses; no subclavian or femoral bruits Neurological: grossly nonfocal   EKG: Normal sinus rhythm, left bundle branch block  Lipid Panel     Component Value Date/Time   CHOL 208* 05/29/2011 0810   TRIG  93.0 05/29/2011 0810   HDL 70.40 05/29/2011 0810   CHOLHDL 3 05/29/2011 0810   VLDL 18.6 05/29/2011 0810    BMET    Component Value Date/Time   NA 138 06/17/2012 0838   K 4.1 06/17/2012 0838   CL 103 06/17/2012 0838   CO2 29 06/17/2012 0838   GLUCOSE 95 06/17/2012 0838   BUN 11 06/17/2012 0838   CREATININE 0.8 06/17/2012 0838   CALCIUM 9.3 06/17/2012 0838     ASSESSMENT AND PLAN HTN (hypertension), benign Madison White makes a strong argument for "white coat hypertension" is the major reason for her elevated blood pressure. Her blood pressure recordings at home (with a monitor that we have tested in the office) do show frequent systolic blood pressure in the 140-150 mm Hg range, but have never recorded severely elevated blood pressure as we do in the clinic. Madison White agrees that when she was walking on a daily basis weight less than today her blood pressure was completely normal. She promises to return to a program of daily exercise, reduce her sodium intake and try to lose some weight. I think this will go a long way towards "curing" her elevated blood pressure. I do not see a compelling reason to keep her on  antihypertensive medications especially since she believes these are causing a variety of side effects. I have asked her to continue recording her blood pressure with her home monitor and to send me periodic recordings.  Left bundle branch block No significant structural heart disease by previous workup with echo and nuclear stress testing.  Dyslipidemia She has borderline elevated total cholesterol, slightly elevated LDL cholesterol, but has excellent HDL cholesterol. I do not think pharmacological therapy is needed. Her promise to pursue rigorous physical exercise and lose weight will probably improve these numbers. In a similar fashion her borderline hyperglycemia should also get better. I have asked her to come back in 6 months so that we can analyze the results of her labor.  No orders of the defined types were placed in this encounter.   Meds ordered this encounter  Medications  . fexofenadine (ALLEGRA) 180 MG tablet    Sig: Take 180 mg by mouth daily.    Junious Silk, MD, [facility] [phone] office [phone] pager

## 2012-09-13 ENCOUNTER — Encounter: Payer: Self-pay | Admitting: Family Medicine

## 2012-11-19 ENCOUNTER — Encounter: Payer: Self-pay | Admitting: Family Medicine

## 2012-11-19 ENCOUNTER — Ambulatory Visit (INDEPENDENT_AMBULATORY_CARE_PROVIDER_SITE_OTHER): Payer: Medicare Other | Admitting: Family Medicine

## 2012-11-19 VITALS — BP 130/66 | HR 76 | Temp 97.6°F | Ht 60.0 in | Wt 155.8 lb

## 2012-11-19 DIAGNOSIS — Z23 Encounter for immunization: Secondary | ICD-10-CM

## 2012-11-19 DIAGNOSIS — K219 Gastro-esophageal reflux disease without esophagitis: Secondary | ICD-10-CM

## 2012-11-19 MED ORDER — ZOSTER VACCINE LIVE 19400 UNT/0.65ML ~~LOC~~ SOLR: 0.6500 mL | Freq: Once | SUBCUTANEOUS | Status: DC

## 2012-11-19 NOTE — Addendum Note (Signed)
Addended by: Eulah Pont on: 11/19/2012 08:56 AM   Modules accepted: Orders

## 2012-11-19 NOTE — Progress Notes (Signed)
OFFICE NOTE  11/19/2012  CC:  Chief Complaint  Patient presents with  . Follow-up     HPI: Patient is a 79 y.o. Caucasian female who is here for 4 mo f/u HTN. She stopped her bp med.  Feels better off of it and says bp has been normal. Having some GER lately, even having some hoarse voice recently from it.  No exertional CP, no SOB or nausea or diaphoresis.  Some URI sx's lately are lingering some but no fevers, no face pain, no HA or ST, no SOB.  Only occ cough that she thinks is more likely reflux induced than related to her cold.   Pertinent PMH:  Past Medical History  Diagnosis Date  . Insulin resistance     A1c's excellent per old records (6.1 in 2008 and 2009)  . HTN (hypertension)     hx of refusing treatment--White coat HTN (?)   . Hyperlipidemia     hx of refusing treatment  . GERD (gastroesophageal reflux disease)   . History of rheumatic fever   . History of blood transfusion   . Seasonal allergies   . Osteoarthritis   . Left bundle branch block 12/2011    Dr. Royann Shivers at [facility] H&V; ECHO and myocardial perfusion scan showed  septal and apical wall motion abnormality but she had no significant valvular disease and no ischemia.  She did have mildly decreased EF (39% on lexiscan and 50% on echo) and abnl LV relaxation.  Mild amount of PVCs.  Pt at higher risk for other conduction abnormalities.  [name] LVH (left ventricular hypertrophy)   . Diastolic dysfunction, left ventricle    Past Surgical History  Procedure Laterality Date  . Cesarean section  X 5    One of her neonates died soon after birth  . Strabismus surgery  1939  . Tonsillectomy  1938  . Abdominal hysterectomy  1963    At the time of her last C/S; says she had partial bladder resection at that time as well  . Cardiovascular stress test  08/2005 & 12/2011    Low risk scans (on the 12/2011 scan she did have EF 39% with moderately severe LV dysfunction with septal dyssynergy probably contributed by LBBB  .  Cataract extraction  08/14/11    both  . Transthoracic echocardiogram  01/13/12    Septal dyssynergy, EF 50%, LV relaxation impaired.  No significant valvular abnormalities.   Past family and social history reviewed and there are no changes since the patient's last office visit with me.  MEDS:  Outpatient Prescriptions Prior to Visit  Medication Sig Dispense Refill  . Calcium Carbonate-Vitamin D (CALCIUM 600 + D PO) Take 1 tablet by mouth daily.      . Cyanocobalamin (VITAMIN B-12 CR PO) Take by mouth.      . fexofenadine (ALLEGRA) 180 MG tablet Take 180 mg by mouth daily.      . fish oil-omega-3 fatty acids 1000 MG capsule Take 2 g by mouth daily.      [name] ibuprofen (ADVIL,MOTRIN) 200 MG tablet Take 200 mg by mouth 2 (two) times daily.      . Multiple Vitamins-Minerals (CENTRUM PO) Take by mouth.      [name] amLODipine (NORVASC) 5 MG tablet Take 1 tablet by mouth daily.      [name] aspirin 81 MG tablet Take 1 tablet (81 mg total) by mouth daily.  30 tablet  1   No facility-administered medications prior to visit.  PE: Blood pressure 130/66, pulse 76, temperature 97.6 F (36.4 C), temperature source Oral, height 5' (1.524 m), weight 155 lb 12 oz (70.648 kg), SpO2 94.00%. Gen: Alert, well appearing.  Patient is oriented to person, place, time, and situation. ENT: Ears: EACs clear, normal epithelium.  TMs with good light reflex and landmarks bilaterally.  Eyes: no injection, icteris, swelling, or exudate.  EOMI, PERRLA. Nose: no drainage or turbinate edema/swelling.  No injection or focal lesion.  Mouth: lips without lesion/swelling.  Oral mucosa pink and moist.  Dentition intact and without obvious caries or gingival swelling.  Oropharynx without erythema, exudate, or swelling.  CV: RRR Chest is clear, no wheezing or rales. Normal symmetric air entry throughout both lung fields. No chest wall deformities or tenderness. EXT: right LE with trace pitting edema, Left LE with 1+ pitting  edema  IMPRESSION AND PLAN:  1) HTN, seems to be well controlled with diet at this time.  She is going to try to increase her exercise and will continue to monitor bp, call if bp consistently up.  2) GERD.  Diet handout reviewed and given to pt.  She is resistant to the idea of medications for this (or anything else).  Flu vaccine IM today.  Zostavax rx given to pt.  FOLLOW UP: 6 mo

## 2012-12-03 ENCOUNTER — Other Ambulatory Visit: Payer: Self-pay | Admitting: Family Medicine

## 2013-03-29 ENCOUNTER — Encounter: Payer: Self-pay | Admitting: Family Medicine

## 2013-03-29 ENCOUNTER — Ambulatory Visit (INDEPENDENT_AMBULATORY_CARE_PROVIDER_SITE_OTHER): Payer: Medicare Other | Admitting: Family Medicine

## 2013-03-29 VITALS — BP 143/85 | HR 87 | Temp 98.2°F | Resp 18 | Ht 60.0 in | Wt 153.0 lb

## 2013-03-29 DIAGNOSIS — J180 Bronchopneumonia, unspecified organism: Secondary | ICD-10-CM

## 2013-03-29 DIAGNOSIS — J209 Acute bronchitis, unspecified: Secondary | ICD-10-CM

## 2013-03-29 DIAGNOSIS — J069 Acute upper respiratory infection, unspecified: Secondary | ICD-10-CM

## 2013-03-29 MED ORDER — AZITHROMYCIN 250 MG PO TABS: ORAL_TABLET | ORAL | Status: DC

## 2013-03-29 MED ORDER — PREDNISONE 20 MG PO TABS: ORAL_TABLET | ORAL | Status: DC

## 2013-03-29 NOTE — Progress Notes (Signed)
OFFICE NOTE  03/29/2013  CC:  Chief Complaint  Patient presents with  . Cough  . Nasal Congestion    chest congestion also     HPI: Patient is a 80 y.o. Caucasian female who is here for cough. Describes double-sickening:About 2 and 1/2 wks of illness.  Initially had cough, nasal congestion/mucous, ST, achy, no known fevers.  No face pain and no HA.  Felt almost back to normal for 1-2 days then sx's returned the same and have been present again for over a week. No wheezing, chest tightness, or SOB.  Has mild pain in left side of mid back.  No n/v/d or rash. Has tried nyquil and mucinex DM and advil. Daughter sick with same sx's in similar time period.  Pertinent PMH:  Past Medical History  Diagnosis Date  . Insulin resistance     A1c's excellent per old records (6.1 in 2008 and 2009)  . HTN (hypertension)     hx of refusing treatment--White coat HTN (?)   . Hyperlipidemia     hx of refusing treatment  . GERD (gastroesophageal reflux disease)   . History of rheumatic fever   . History of blood transfusion   . Seasonal allergies   . Osteoarthritis   . Left bundle branch block 12/2011    Dr. Sallyanne Kuster at [facility]; ECHO and myocardial perfusion scan showed  septal and apical wall motion abnormality but she had no significant valvular disease and no ischemia.  She did have mildly decreased EF (39% on lexiscan and 50% on echo) and abnl LV relaxation.  Mild amount of PVCs.  Pt at higher risk for other conduction abnormalities.  [name] LVH (left ventricular hypertrophy)   . Diastolic dysfunction, left ventricle     MEDS:  As per HPI   PE: Blood pressure 143/85, pulse 87, temperature 98.2 F (36.8 C), temperature source Temporal, resp. rate 18, height 5' (1.524 m), weight 153 lb (69.4 kg), SpO2 96.00%. VS: noted--normal. Gen: alert, NAD, NONTOXIC APPEARING. HEENT: eyes without injection, drainage, or swelling.  Ears: EACs clear, TMs with normal light reflex and landmarks.  Nose: Clear  rhinorrhea, with some dried, crusty exudate adherent to mildly injected mucosa.  No purulent d/c.  No paranasal sinus TTP.  No facial swelling.  Throat and mouth without focal lesion.  No pharyngial swelling, erythema, or exudate.   Neck: supple, no LAD.   LUNGS: CTA bilat, nonlabored resps.  Left mid lung field with diminshed BS and bronchial BS but no crackles.  No wheezing or prolongation of exp phase.  Nonlabored resps. CV: RRR, no m/r/g. EXT: no c/c/e SKIN: no rash  LAB: none today  IMPRESSION AND PLAN:  Prolonged URI, bronchitis. Suspect early bronchopneumonia. Plan: prednisone 40mg  qd x 5d. Azithromycin x 5d. Continue mucinex DM prn, add saline nasal spray. Signs/symptoms to call or return for were reviewed and pt expressed understanding.  An After Visit Summary was printed and given to the patient.  FOLLOW UP: prn

## 2013-03-29 NOTE — Progress Notes (Signed)
Pre visit review using our clinic review tool, if applicable. No additional management support is needed unless otherwise documented below in the visit note. 

## 2013-03-29 NOTE — Patient Instructions (Signed)
Continue mucinex DM OTC cough med.  Take OTC, generic saline nasal spray: 2-3 sprays each nostril several times per day to irrigate and moisturize your nasal passages.

## 2013-05-19 ENCOUNTER — Encounter: Payer: Self-pay | Admitting: Family Medicine

## 2013-05-19 ENCOUNTER — Ambulatory Visit (INDEPENDENT_AMBULATORY_CARE_PROVIDER_SITE_OTHER): Payer: Medicare Other | Admitting: Family Medicine

## 2013-05-19 VITALS — BP 185/95 | HR 87 | Temp 98.4°F | Resp 18 | Ht 60.0 in | Wt 157.0 lb

## 2013-05-19 DIAGNOSIS — L989 Disorder of the skin and subcutaneous tissue, unspecified: Secondary | ICD-10-CM

## 2013-05-19 DIAGNOSIS — IMO0001 Reserved for inherently not codable concepts without codable children: Secondary | ICD-10-CM

## 2013-05-19 DIAGNOSIS — I10 Essential (primary) hypertension: Secondary | ICD-10-CM

## 2013-05-19 DIAGNOSIS — J180 Bronchopneumonia, unspecified organism: Secondary | ICD-10-CM

## 2013-05-19 DIAGNOSIS — K219 Gastro-esophageal reflux disease without esophagitis: Secondary | ICD-10-CM

## 2013-05-19 MED ORDER — OMEPRAZOLE 20 MG PO CPDR: 20.0000 mg | DELAYED_RELEASE_CAPSULE | Freq: Every day | ORAL | Status: DC

## 2013-05-19 NOTE — Progress Notes (Signed)
OFFICE NOTE  05/19/2013  CC:  Chief Complaint  Patient presents with  . Follow-up  . Rash    facial     HPI: Patient is a 80 y.o. Caucasian female who is here for 6 mo f/u HTN and GERD. Hx of white coat HTN.  Home bps still normal the large majority of the time. She is finally feeling quite a bit better from her bout of bronchopneumonia a couple of months ago.  Heartburn still frequent, feels tickle in throat a lot. Has not taken any med for GERD despite [place] discussing this in the past.  Has crusty papule on face to the right of her nasal area for the last several weeks, bothers her when wearing glasses.  Has had lesion removed in remote past from her forehead.  Pertinent PMH:  Past medical, surgical, social, and family history reviewed and no changes are noted since last office visit.  MEDS:  Outpatient Prescriptions Prior to Visit  Medication Sig Dispense Refill  . Calcium Carbonate-Vitamin D (CALCIUM 600 + D PO) Take 1 tablet by mouth daily.      . Cyanocobalamin (VITAMIN B-12 CR PO) Take by mouth.      . fish oil-omega-3 fatty acids 1000 MG capsule Take 2 g by mouth daily.      [name] ibuprofen (ADVIL,MOTRIN) 200 MG tablet Take 200 mg by mouth 2 (two) times daily.      . Multiple Vitamins-Minerals (CENTRUM PO) Take by mouth.      [name] aspirin 81 MG tablet Take 1 tablet (81 mg total) by mouth daily.  30 tablet  1  . zoster vaccine live, PF, (ZOSTAVAX) 67893 UNT/0.65ML injection Inject 19,400 Units into the skin once.  1 vial  0  . azithromycin (ZITHROMAX) 250 MG tablet 2 tabs po qd x 1d, then 1 tab po qd x 4d  6 each  0  . predniSONE (DELTASONE) 20 MG tablet 2 tabs po qd x 5d  10 tablet  0   No facility-administered medications prior to visit.  *Not taking prednisone or azithromycin as listed above.  PE: Blood pressure 185/95, pulse 87, temperature 98.4 F (36.9 C), temperature source Temporal, resp. rate 18, height 5' (1.524 m), weight 157 lb (71.215 kg), SpO2 98.00%. Gen:  Alert, well appearing.  Patient is oriented to person, place, time, and situation. FACE: right nasolabial fold with pinkish papule with crusty tip.  No erythema or tenderness. Remainder of face is w/out lesion or erythema. CV: RRR, no m/r/g.   LUNGS: CTA bilat, nonlabored resps, good aeration in all lung fields.   IMPRESSION AND PLAN:  1) White coat HTN.  Continue to monitor home bp, call if persistently up >150/95.  2) Skin lesion of face: c/w actinic keratosis lesion.  Referral to derm ordered today.  3) GERD/Laryngopharygeal reflux (tickle + cough). Start daily omeprazole 20mg .  Therapeutic expectations and side effect profile of medication discussed today.  Patient's questions answered.  4) Hx of hyperlipidemia and insulin resistance: pt declined any blood recheck today. Continue with prudent diet and encouraged pt to get more active now that weather is warming up some.  5) Bronchopneumonia: resolving appropriately.  An After Visit Summary was printed and given to the patient.  FOLLOW UP: 3 mo f/u GERD.

## 2013-05-19 NOTE — Progress Notes (Signed)
Pre visit review using our clinic review tool, if applicable. No additional management support is needed unless otherwise documented below in the visit note. 

## 2013-05-20 ENCOUNTER — Telehealth: Payer: Self-pay | Admitting: Family Medicine

## 2013-05-20 NOTE — Telephone Encounter (Signed)
emmi report mailed to patient ° °

## 2013-08-18 ENCOUNTER — Encounter: Payer: Self-pay | Admitting: Family Medicine

## 2013-08-18 ENCOUNTER — Ambulatory Visit (INDEPENDENT_AMBULATORY_CARE_PROVIDER_SITE_OTHER): Payer: Medicare Other | Admitting: Family Medicine

## 2013-08-18 VITALS — BP 154/83 | HR 81 | Temp 98.3°F | Resp 16 | Ht 60.0 in | Wt 158.0 lb

## 2013-08-18 DIAGNOSIS — R059 Cough, unspecified: Secondary | ICD-10-CM

## 2013-08-18 DIAGNOSIS — R058 Other specified cough: Secondary | ICD-10-CM

## 2013-08-18 DIAGNOSIS — R05 Cough: Secondary | ICD-10-CM

## 2013-08-18 MED ORDER — OMEPRAZOLE 20 MG PO CPDR: 20.0000 mg | DELAYED_RELEASE_CAPSULE | Freq: Every day | ORAL | Status: DC

## 2013-08-18 MED ORDER — CLINDAMYCIN HCL 300 MG PO CAPS: 300.0000 mg | ORAL_CAPSULE | Freq: Three times a day (TID) | ORAL | Status: DC

## 2013-08-18 MED ORDER — PREDNISONE 20 MG PO TABS: ORAL_TABLET | ORAL | Status: DC

## 2013-08-18 NOTE — Progress Notes (Signed)
Pre visit review using our clinic review tool, if applicable. No additional management support is needed unless otherwise documented below in the visit note. 

## 2013-08-18 NOTE — Progress Notes (Signed)
OFFICE NOTE  08/18/2013  CC:  Chief Complaint  Patient presents with  . Follow-up     HPI: Patient is a 80 y.o. Caucasian female who is here for 3 mo f/u GERD/laryngopharygeal reflux sx's.   Pt took prilosec for a few weeks, then felt like everything was fine so she stopped it. It apparently had no effect on her tickle in throat/cough/gravely voice.  Denies wheezing, SOB, productive cough. Denies heartburn.  Rarely uses tums.  No use of cough drops. Voices concern about feeling nasal and sinus congestion, feels PND, has no face pain or HA except for some focal forehead pain above right eye.  No purulent mucous from nose.  No fevers or nausea.  ROS: no melena, no abd pain, no ST, no fever.  No focal or generalized weakness.  +mild fatigue/malaise last couple of weeks, no SOB, no chest pain.  No dizziness or generalized HA's.  Pertinent PMH:  Past medical, surgical, social, and family history reviewed and no changes are noted since last office visit.   MEDS: Not taking aspirin, omeprazole, or allegra listed below. Outpatient Prescriptions Prior to Visit  Medication Sig Dispense Refill  . Calcium Carbonate-Vitamin D (CALCIUM 600 + D PO) Take 1 tablet by mouth daily.      . Cyanocobalamin (VITAMIN B-12 CR PO) Take by mouth.      . fish oil-omega-3 fatty acids 1000 MG capsule Take 2 g by mouth daily.      [name] ibuprofen (ADVIL,MOTRIN) 200 MG tablet Take 200 mg by mouth 2 (two) times daily.      . Multiple Vitamins-Minerals (CENTRUM PO) Take by mouth.      [name] aspirin 81 MG tablet Take 1 tablet (81 mg total) by mouth daily.  30 tablet  1  . fexofenadine (ALLEGRA) 180 MG tablet Take 180 mg by mouth daily.      [name] omeprazole (PRILOSEC) 20 MG capsule Take 1 capsule (20 mg total) by mouth daily.  30 capsule  3  . zoster vaccine live, PF, (ZOSTAVAX) 40981 UNT/0.65ML injection Inject 19,400 Units into the skin once.  1 vial  0   No facility-administered medications prior to visit.    PE: Blood  pressure 154/83, pulse 81, temperature 98.3 F (36.8 C), temperature source Temporal, resp. rate 16, height 5' (1.524 m), weight 158 lb (71.668 kg), SpO2 97.00%. Gen: Alert, well appearing.  Patient is oriented to person, place, time, and situation. XBJ:YNWG: no injection, icteris, swelling, or exudate.  EOMI, PERRLA.  Nose w/out signif erythema or congestion.  No purulent discharge.  Face without tenderness to palpation except small area in right paranasal region at the site of relatively recent skin lesion removal. Mouth: lips without lesion/swelling.  Oral mucosa pink and moist. Oropharynx without erythema, exudate, or swelling.  Neck - No masses or thyromegaly or limitation in range of motion CV: RRR, no m/r/g.   LUNGS: CTA bilat, nonlabored resps, good aeration in all lung fields.   IMPRESSION AND PLAN:  Upper airway cough syndrome: discussed dx, potential treatments. Recommended prilosec 20mg  qd, clindamycin 300mg  tid x 14d, prednisone 40mg  x 5d, then 20 mg qd x 5d. Will see back in 4 wks, discuss prevnar 13 at that time.  An After Visit Summary was printed and given to the patient.

## 2013-11-09 ENCOUNTER — Encounter: Payer: Self-pay | Admitting: Cardiovascular Disease

## 2013-11-09 ENCOUNTER — Ambulatory Visit (INDEPENDENT_AMBULATORY_CARE_PROVIDER_SITE_OTHER): Payer: Medicare Other | Admitting: Cardiovascular Disease

## 2013-11-09 VITALS — BP 162/88 | HR 88 | Resp 16 | Ht 60.0 in | Wt 157.5 lb

## 2013-11-09 DIAGNOSIS — I447 Left bundle-branch block, unspecified: Secondary | ICD-10-CM

## 2013-11-09 DIAGNOSIS — E785 Hyperlipidemia, unspecified: Secondary | ICD-10-CM

## 2013-11-09 DIAGNOSIS — I10 Essential (primary) hypertension: Secondary | ICD-10-CM

## 2013-11-09 NOTE — Progress Notes (Signed)
Patient ID: Madison White, female   DOB: 25-Aug-1928, 80 y.o.   MRN: [id number]     Reason for office visit HTN, hyperlipidemia  Mrs. Wiginton has borderline HTN, with well documented situational ("white-coat") HTN, chronic left bundle branch block and mild hyperlipidemia. Echocardiography in 2013 showed left ventricular ejection fraction of 50%. Her nuclear stress test showed normal perfusion and reported an EF of 39% with LBBB related septal dyssynergy. She has never had manifestations of congestive heart failure.  She presents today for routine followup and has no complaints. Her blood pressures again a little elevated today but her typical systolic blood pressure at home is in the 120-150 range and her typical diastolic blood pressure is around 60-70 mm Hg. She had not had complaints of syncope dizziness lightheadedness or palpitations. She denies lower extremity edema, dyspnea or angina.  Allergies  Allergen Reactions  . Avapro [Irbesartan] Other (See Comments)    Unknown rxn  . Lisinopril Swelling    Swelling around eyes; also says it caused increased sugar and BP  . Penicillins Hives  . Simvastatin Other (See Comments)    unknown  . Sulfa Antibiotics Rash    Current Outpatient Prescriptions  Medication Sig Dispense Refill  . aspirin 81 MG tablet Take 1 tablet (81 mg total) by mouth daily.  30 tablet  1  . Calcium Carbonate-Vitamin D (CALCIUM 600 + D PO) Take 1 tablet by mouth daily.      . Cyanocobalamin (VITAMIN B-12 CR PO) Take by mouth.      . fexofenadine (ALLEGRA) 180 MG tablet Take 180 mg by mouth daily.      . fish oil-omega-3 fatty acids 1000 MG capsule Take 2 g by mouth daily.      [name] ibuprofen (ADVIL,MOTRIN) 200 MG tablet Take 200 mg by mouth 2 (two) times daily.      . Multiple Vitamins-Minerals (CENTRUM PO) Take by mouth.      [name] omeprazole (PRILOSEC) 20 MG capsule Take 20 mg by mouth as needed.       No current facility-administered medications for this visit.     Past Medical History  Diagnosis Date  . Insulin resistance     A1c's excellent per old records (6.1 in 2008 and 2009)  . HTN (hypertension)     hx of refusing treatment--White coat HTN (?)   . Hyperlipidemia     hx of refusing treatment  . GERD (gastroesophageal reflux disease)   . History of rheumatic fever   . History of blood transfusion   . Seasonal allergies   . Osteoarthritis   . Left bundle branch block 12/2011    Dr. Sallyanne Kuster at [facility]; ECHO and myocardial perfusion scan showed  septal and apical wall motion abnormality but she had no significant valvular disease and no ischemia.  She did have mildly decreased EF (39% on lexiscan and 50% on echo) and abnl LV relaxation.  Mild amount of PVCs.  Pt at higher risk for other conduction abnormalities.  [name] LVH (left ventricular hypertrophy)   . Diastolic dysfunction, left ventricle     Past Surgical History  Procedure Laterality Date  . Cesarean section  X 5    One of her neonates died soon after birth  . Strabismus surgery  1939  . Tonsillectomy  1938  . Abdominal hysterectomy  1963    At the time of her last C/S; says she had partial bladder resection at that time as well  . Cardiovascular stress test  08/2005 & 12/2011    Low risk scans (on the 12/2011 scan she did have EF 39% with moderately severe LV dysfunction with septal dyssynergy probably contributed by LBBB  . Cataract extraction  08/14/11    both  . Transthoracic echocardiogram  01/13/12    Septal dyssynergy, EF 50%, LV relaxation impaired.  No significant valvular abnormalities.    Family History  Problem Relation Age of Onset  . Hypertension Mother   . Heart disease Mother   . Hyperlipidemia Mother   . Diabetes Mother   . Heart disease Father   . Cancer Father     History   Social History  . Marital Status: Widowed    Spouse Name: N/A    Number of Children: N/A  . Years of Education: N/A   Occupational History  . Not on file.   Social History  Main Topics  . Smoking status: Former Smoker    Quit date: 05/31/1958  . Smokeless tobacco: Never Used  . Alcohol Use: Not on file  . Drug Use: Not on file  . Sexual Activity: Not on file   Other Topics Concern  . Not on file   Social History Narrative   Widower, husband died around 49 (wrongful death per pt's report).   Originally from [place], has been in [place] since about 1997.   Lives alone.  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   Fairly active and independent.   Distant hx of tobacco abuse.  No alcohol or drugs.    Review of systems: The patient specifically denies any chest pain at rest or with exertion, dyspnea at rest or with exertion, orthopnea, paroxysmal nocturnal dyspnea, syncope, palpitations, focal neurological deficits, intermittent claudication, lower extremity edema, unexplained weight gain, cough, hemoptysis or wheezing.  The patient also denies abdominal pain, nausea, vomiting, dysphagia, diarrhea, constipation, polyuria, polydipsia, dysuria, hematuria, frequency, urgency, abnormal bleeding or bruising, fever, chills, unexpected weight changes, mood swings, change in skin or hair texture, change in voice quality, auditory or visual problems, allergic reactions or rashes, new musculoskeletal complaints other than usual "aches and pains".   PHYSICAL EXAM BP 162/88  Pulse 88  Resp 16  Ht 5' (1.524 m)  Wt 157 lb 8 oz (71.442 kg)  BMI 30.76 kg/m2  General: Alert, oriented x3, no distress Head: no evidence of trauma, PERRL, EOMI, no exophtalmos or lid lag, no myxedema, no xanthelasma; normal ears, nose and oropharynx Neck: normal jugular venous pulsations and no hepatojugular reflux; brisk carotid pulses without delay and no carotid bruits Chest: clear to auscultation, no signs of consolidation by percussion or palpation, normal fremitus, symmetrical and full respiratory excursions Cardiovascular: normal position and quality of  the apical impulse, regular rhythm, normal first and paradoxically split second heart sounds, no murmurs, rubs or gallops Abdomen: no tenderness or distention, no masses by palpation, no abnormal pulsatility or arterial bruits, normal bowel sounds, no hepatosplenomegaly Extremities: no clubbing, cyanosis or edema; 2+ radial, ulnar and brachial pulses bilaterally; 2+ right femoral, posterior tibial and dorsalis pedis pulses; 2+ left femoral, posterior tibial and dorsalis pedis pulses; no subclavian or femoral bruits Neurological: grossly nonfocal   EKG: Normal sinus rhythm, left axis deviation, left bundle branch block, prolonged PR interval 230 ms  Lipid Panel     Component Value Date/Time   CHOL 208* 05/29/2011 0810   TRIG 93.0 05/29/2011 0810   HDL 70.40 05/29/2011 0810   CHOLHDL 3 05/29/2011 0810   VLDL 18.6 05/29/2011  0810    BMET    Component Value Date/Time   NA 138 06/17/2012 0838   K 4.1 06/17/2012 0838   CL 103 06/17/2012 0838   CO2 29 06/17/2012 0838   GLUCOSE 95 06/17/2012 0838   BUN 11 06/17/2012 0838   CREATININE 0.8 06/17/2012 0838   CALCIUM 9.3 06/17/2012 0838     ASSESSMENT AND PLAN  Mrs. Echeverria has borderline systemic hypertension that does not require treatment. The current guidelines for the generic ends. She has borderline left ventricular systolic function, related to a chronic left bundle branch block, but has never had signs or symptoms of congestive heart failure. She prefers not to take medications for blood pressure and indeed has had side effects whenever we have tried to institute such treatment. She has borderline elevated total cholesterol but excellent HDL cholesterol and statin therapy is not indicated.  She has substantial evidence of A-V node and intraventricular conduction disease and may need pacemaker in the future, should she develop symptoms of bradycardia/AV block. At this point she does not have such complaints.  Orders Placed This Encounter  Procedures  .  EKG 12-Lead   Meds ordered this encounter  Medications  . omeprazole (PRILOSEC) 20 MG capsule    Sig: Take 20 mg by mouth as needed.    Holli Humbles, MD, [place] [phone] office [phone] pager

## 2013-11-09 NOTE — Patient Instructions (Signed)
Dr. Croitoru recommends that you schedule a follow-up appointment in: One year.   

## 2014-02-20 ENCOUNTER — Encounter: Payer: Self-pay | Admitting: Family Medicine

## 2014-02-20 ENCOUNTER — Ambulatory Visit (INDEPENDENT_AMBULATORY_CARE_PROVIDER_SITE_OTHER): Payer: Medicare Other | Admitting: Family Medicine

## 2014-02-20 VITALS — BP 172/76 | HR 83 | Temp 98.1°F | Resp 18 | Ht 60.0 in | Wt 155.0 lb

## 2014-02-20 DIAGNOSIS — R058 Other specified cough: Secondary | ICD-10-CM

## 2014-02-20 DIAGNOSIS — R05 Cough: Secondary | ICD-10-CM

## 2014-02-20 DIAGNOSIS — Z0000 Encounter for general adult medical examination without abnormal findings: Secondary | ICD-10-CM

## 2014-02-20 DIAGNOSIS — Z23 Encounter for immunization: Secondary | ICD-10-CM

## 2014-02-20 DIAGNOSIS — K5909 Other constipation: Secondary | ICD-10-CM

## 2014-02-20 DIAGNOSIS — K5900 Constipation, unspecified: Secondary | ICD-10-CM

## 2014-02-20 MED ORDER — PREDNISONE 20 MG PO TABS: ORAL_TABLET | ORAL | Status: DC

## 2014-02-20 MED ORDER — PANTOPRAZOLE SODIUM 40 MG PO TBEC: 40.0000 mg | DELAYED_RELEASE_TABLET | Freq: Every day | ORAL | Status: DC

## 2014-02-20 NOTE — Progress Notes (Signed)
OFFICE VISIT  02/26/2014   CC:  Chief Complaint  Patient presents with  . Annual Exam   HPI:    Patient is a 80 y.o. Caucasian female who presents for CPE and also has cough and chronic constipation.  Has had some cough lately--same as cough she has had all along--hx of upper airway cough syndrome, maybe a bit worse since being around some burning brush from yard waste lately.   She got off the prilosec I had her on for her chronic upper airway cough syndrome, no clear reason except she just doesn't like to take meds and has long hx of noncompliance.  No wheezing or SOB.  + Some hoarse voice.  No fevers.  Constipation last couple months, now having "little pebbles" come out frequently when she tries to just urinate.  No blood in BMs.  No rectal pain.  No OTC meds have been tried.  No dietary changes have been attempted.  Occ bp check at home: normal per pt report today.     Past Medical History  Diagnosis Date  . Insulin resistance     A1c's excellent per old records (6.1 in 2008 and 2009)  . HTN (hypertension)     hx of refusing treatment--White coat HTN (?)   . Hyperlipidemia     hx of refusing treatment  . GERD (gastroesophageal reflux disease)   . History of rheumatic fever   . History of blood transfusion   . Seasonal allergies   . Osteoarthritis   . Left bundle branch block 12/2011    Dr. Sallyanne Kuster at [facility]; ECHO and myocardial perfusion scan showed  septal and apical wall motion abnormality but she had no significant valvular disease and no ischemia.  She did have mildly decreased EF (39% on lexiscan and 50% on echo) and abnl LV relaxation.  Mild amount of PVCs.  Pt at higher risk for other conduction abnormalities, good chance of eventually requiring a pacemaker.  [name] LVH (left ventricular hypertrophy)   . Diastolic dysfunction, left ventricle     Past Surgical History  Procedure Laterality Date  . Cesarean section  X 5    One of her neonates died soon after birth  .  Strabismus surgery  1939  . Tonsillectomy  1938  . Abdominal hysterectomy  1963    At the time of her last C/S; says she had partial bladder resection at that time as well  . Cardiovascular stress test  08/2005 & 12/2011    Low risk scans (on the 12/2011 scan she did have EF 39% with moderately severe LV dysfunction with septal dyssynergy probably contributed by LBBB  . Cataract extraction  08/14/11    both  . Transthoracic echocardiogram  01/13/12    Septal dyssynergy, EF 50%, LV relaxation impaired.  No significant valvular abnormalities.    Outpatient Prescriptions Prior to Visit  Medication Sig Dispense Refill  . aspirin 81 MG tablet Take 1 tablet (81 mg total) by mouth daily. 30 tablet 1  . Calcium Carbonate-Vitamin D (CALCIUM 600 + D PO) Take 1 tablet by mouth daily.    . Cyanocobalamin (VITAMIN B-12 CR PO) Take by mouth.    . fexofenadine (ALLEGRA) 180 MG tablet Take 180 mg by mouth daily.    [name] ibuprofen (ADVIL,MOTRIN) 200 MG tablet Take 200 mg by mouth 2 (two) times daily.    . Multiple Vitamins-Minerals (CENTRUM PO) Take by mouth.    [name] omeprazole (PRILOSEC) 20 MG capsule Take 20 mg by  mouth as needed.    . fish oil-omega-3 fatty acids 1000 MG capsule Take 2 g by mouth daily.     No facility-administered medications prior to visit.    Allergies  Allergen Reactions  . Avapro [Irbesartan] Other (See Comments)    Unknown rxn  . Lisinopril Swelling    Swelling around eyes; also says it caused increased sugar and BP  . Penicillins Hives  . Simvastatin Other (See Comments)    unknown  . Sulfa Antibiotics Rash    ROS As per HPI  PE: Blood pressure 172/76, pulse 83, temperature 98.1 F (36.7 C), temperature source Oral, resp. rate 18, height 5' (1.524 m), weight 155 lb (70.308 kg), SpO2 96 %. Gen: Alert, well appearing.  Patient is oriented to person, place, time, and situation. AFFECT: pleasant, lucid thought and speech. ENT: Ears: EACs clear, normal epithelium.  TMs with  good light reflex and landmarks bilaterally.  Eyes: no injection, icteris, swelling, or exudate.  EOMI, PERRLA. Nose: no drainage or turbinate edema/swelling.  No injection or focal lesion.  Mouth: lips without lesion/swelling.  Oral mucosa pink and moist.  Dentition intact and without obvious caries or gingival swelling.  Oropharynx without erythema, exudate, or swelling.  Neck: supple/nontender.  No LAD, mass, or TM.  Carotid pulses 2+ bilaterally, without bruits. CV: RRR, S1 and S2 are distant, no m/r/g.   LUNGS: CTA bilat, nonlabored resps, good aeration in all lung fields. ABD: soft, NT, ND, BS normal.  No hepatospenomegaly or mass.  No bruits. EXT: no clubbing, cyanosis, or edema.  Musculoskeletal: no joint swelling, erythema, warmth, or tenderness.  ROM of all joints intact. Skin - no sores or suspicious lesions or rashes or color changes   LABS:  None today Recent: Lab Results  Component Value Date   HGBA1C 5.8 05/29/2011   Lab Results  Component Value Date   WBC 8.9 06/17/2012   HGB 14.6 06/17/2012   HCT 43.3 06/17/2012   MCV 87.7 06/17/2012   PLT 329.0 06/17/2012     Chemistry      Component Value Date/Time   NA 138 06/17/2012 0838   K 4.1 06/17/2012 0838   CL 103 06/17/2012 0838   CO2 29 06/17/2012 0838   BUN 11 06/17/2012 0838   CREATININE 0.8 06/17/2012 0838      Component Value Date/Time   CALCIUM 9.3 06/17/2012 0838   ALKPHOS 43 05/29/2011 0810   AST 24 05/29/2011 0810   ALT 13 05/29/2011 0810   BILITOT 0.3 05/29/2011 0810     Lab Results  Component Value Date   CHOL 208* 05/29/2011   HDL 70.40 05/29/2011   LDLDIRECT 117.9 05/29/2011   TRIG 93.0 05/29/2011   CHOLHDL 3 05/29/2011   Lab Results  Component Value Date   TSH 2.18 05/29/2011     IMPRESSION AND PLAN:  Health maintenance examination Reviewed age and gender appropriate health maintenance issues (prudent diet, regular exercise, health risks of tobacco and excessive alcohol, use of  seatbelts, fire alarms in home, use of sunscreen).  Also reviewed age and gender appropriate health screening as well as vaccine recommendations. Flu vaccine IM today. She declined any lab work today.   Upper airway cough syndrome Exacerbated by recent exposure to burning yard waste. Pantoprazole 40mg  qd.  Prednisone 20mg  qd x 3d, then 10mg  qd x 4d.  Constipation, chronic Start senakot-S generic, 2 tabs po qhs. Start miralax 1 capful qd-bid prn.   An After Visit Summary was printed and given  to the patient.  FOLLOW UP: Return if symptoms worsen or fail to improve.

## 2014-02-20 NOTE — Patient Instructions (Signed)
Buy OTC generic senakot -S and take 2-3 tabs every night.  Buy miralax powder and take 1 capful daily as needed to keep bowel movements regular.

## 2014-02-20 NOTE — Progress Notes (Signed)
Pre visit review using our clinic review tool, if applicable. No additional management support is needed unless otherwise documented below in the visit note. 

## 2014-02-26 DIAGNOSIS — R058 Other specified cough: Secondary | ICD-10-CM | POA: Insufficient documentation

## 2014-02-26 DIAGNOSIS — K5909 Other constipation: Secondary | ICD-10-CM | POA: Insufficient documentation

## 2014-02-26 DIAGNOSIS — Z0000 Encounter for general adult medical examination without abnormal findings: Secondary | ICD-10-CM | POA: Insufficient documentation

## 2014-02-26 DIAGNOSIS — R05 Cough: Secondary | ICD-10-CM | POA: Insufficient documentation

## 2014-02-26 NOTE — Assessment & Plan Note (Signed)
Exacerbated by recent exposure to burning yard waste. Pantoprazole 40mg  qd.  Prednisone 20mg  qd x 3d, then 10mg  qd x 4d.

## 2014-02-26 NOTE — Assessment & Plan Note (Signed)
Reviewed age and gender appropriate health maintenance issues (prudent diet, regular exercise, health risks of tobacco and excessive alcohol, use of seatbelts, fire alarms in home, use of sunscreen).  Also reviewed age and gender appropriate health screening as well as vaccine recommendations. Flu vaccine IM today. She declined any lab work today.

## 2014-02-26 NOTE — Assessment & Plan Note (Signed)
Start senakot-S generic, 2 tabs po qhs. Start miralax 1 capful qd-bid prn.

## 2014-03-17 DIAGNOSIS — I428 Other cardiomyopathies: Secondary | ICD-10-CM

## 2014-03-17 DIAGNOSIS — R519 Headache, unspecified: Secondary | ICD-10-CM

## 2014-03-17 HISTORY — DX: Other cardiomyopathies: I42.8

## 2014-03-17 HISTORY — DX: Headache, unspecified: R51.9

## 2014-08-03 ENCOUNTER — Telehealth: Payer: Self-pay | Admitting: Cardiovascular Disease

## 2014-08-04 NOTE — Telephone Encounter (Signed)
Closed encounter °

## 2014-11-13 ENCOUNTER — Ambulatory Visit (INDEPENDENT_AMBULATORY_CARE_PROVIDER_SITE_OTHER): Payer: Medicare Other | Admitting: Family Medicine

## 2014-11-13 ENCOUNTER — Encounter: Payer: Self-pay | Admitting: Family Medicine

## 2014-11-13 VITALS — BP 149/79 | HR 79 | Temp 98.1°F | Resp 16 | Ht 60.0 in | Wt 151.0 lb

## 2014-11-13 DIAGNOSIS — R42 Dizziness and giddiness: Secondary | ICD-10-CM

## 2014-11-13 DIAGNOSIS — G44039 Episodic paroxysmal hemicrania, not intractable: Secondary | ICD-10-CM

## 2014-11-13 DIAGNOSIS — E878 Other disorders of electrolyte and fluid balance, not elsewhere classified: Secondary | ICD-10-CM

## 2014-11-13 NOTE — Progress Notes (Signed)
OFFICE VISIT  11/13/2014   CC:  Chief Complaint  Patient presents with  . Headache    x 1 month   HPI:    Patient is a 81 y.o. Caucasian female who presents for dizzy spells.  Has them only when she looks up or stands up fasts. Stabbing pain in R side of head.  Dizzy spells x 2 mo: these occur multiple times per day, last < 10 seconds, has never passed out.   These make her a little anxious, but no other physical sx's occurring such as diaphoresis, CP, SOB, or nausea. She is not very phys active but walks dog regularly and feels no palpitations, dizziness, SOB, or CP.   Chronic knee arthritic pain limits her.  Takes a total of 5 OTC ibup pills per day for knee pain.  Pain in R side of head x 1 week:  Comes and goes, present x minutes only, no clear trigger, the pain does not radiate anywhere.   Past Medical History  Diagnosis Date  . Insulin resistance     A1c's excellent per old records (6.1 in 2008 and 2009)  . HTN (hypertension)     hx of refusing treatment--White coat HTN (?)   . Hyperlipidemia     hx of refusing treatment  . GERD (gastroesophageal reflux disease)   . History of rheumatic fever   . History of blood transfusion   . Seasonal allergies   . Osteoarthritis   . Left bundle branch block 12/2011    Dr. Sallyanne Kuster at [facility] H&[facility]; ECHO and myocardial perfusion scan showed  septal and apical wall motion abnormality but she had no significant valvular disease and no ischemia.  She did have mildly decreased EF (39% on lexiscan and 50% on echo) and abnl LV relaxation.  Mild amount of PVCs.  Pt at higher risk for other conduction abnormalities, good chance of eventually requiring a pacemaker.  [name] LVH (left ventricular hypertrophy)   . Diastolic dysfunction, left ventricle     Past Surgical History  Procedure Laterality Date  . Cesarean section  X 5    One of her neonates died soon after birth  . Strabismus surgery  1939  . Tonsillectomy  1938  . Abdominal hysterectomy   1963    At the time of her last C/S; says she had partial bladder resection at that time as well  . Cardiovascular stress test  08/2005 & 12/2011    Low risk scans (on the 12/2011 scan she did have EF 39% with moderately severe LV dysfunction with septal dyssynergy probably contributed by LBBB  . Cataract extraction  08/14/11    both  . Transthoracic echocardiogram  01/13/12    Septal dyssynergy, EF 50%, LV relaxation impaired.  No significant valvular abnormalities.    Outpatient Prescriptions Prior to Visit  Medication Sig Dispense Refill  . aspirin 81 MG tablet Take 1 tablet (81 mg total) by mouth daily. 30 tablet 1  . Calcium Carbonate-Vitamin D (CALCIUM 600 + D PO) Take 1 tablet by mouth daily.    . Cyanocobalamin (VITAMIN B-12 CR PO) Take by mouth.    . fexofenadine (ALLEGRA) 180 MG tablet Take 180 mg by mouth daily.    . fish oil-omega-3 fatty acids 1000 MG capsule Take 2 g by mouth daily.    [name] ibuprofen (ADVIL,MOTRIN) 200 MG tablet Take 200 mg by mouth 2 (two) times daily.    . Multiple Vitamins-Minerals (CENTRUM PO) Take by mouth.    [name]  pantoprazole (PROTONIX) 40 MG tablet Take 1 tablet (40 mg total) by mouth daily. 30 tablet 11  . predniSONE (DELTASONE) 20 MG tablet 1 tab po qd x 3d, then 1/2 tab po qd x 4d (Patient not taking: Reported on 11/13/2014) 5 tablet 0   No facility-administered medications prior to visit.    Allergies  Allergen Reactions  . Avapro [Irbesartan] Other (See Comments)    Unknown rxn  . Lisinopril Swelling    Swelling around eyes; also says it caused increased sugar and BP  . Penicillins Hives  . Simvastatin Other (See Comments)    unknown  . Sulfa Antibiotics Rash    ROS As per HPI  PE: Blood pressure 149/79, pulse 79, temperature 98.1 F (36.7 C), temperature source Oral, resp. rate 16, height 5' (1.524 m), weight 151 lb (68.493 kg), SpO2 93 %. Gen: Alert, well appearing.  Patient is oriented to person, place, time, and situation. Scalp: a  few lightly pigmented, coarse-feeling seb keratoses present on R side of parieto-frontal scalp, without tenderness or erythema or fluctuance or ulceration.   YEM:VVKP: no injection, icteris, swelling, or exudate.  EOMI, PERRLA. Mouth: lips without lesion/swelling.  Oral mucosa pink and moist. Oropharynx without erythema, exudate, or swelling.  Neck - No masses or thyromegaly or limitation in range of motion.  Carotids 2+ bilat, no bruits. No supraclavicular or subclavicular bruit. CV: RRR, no m/r/g.   LUNGS: CTA bilat, nonlabored resps, good aeration in all lung fields. Neuro: CN 2-12 intact bilaterally, strength 5/5 in proximal and distal upper extremities and lower extremities bilaterally.    No tremor.  No disdiadochokinesis.  No ataxia.  Upper extremity and lower extremity DTRs symmetric.  No pronator drift.  LABS:  none  IMPRESSION AND PLAN:  1) Disequilibrium syndrome.  Some of this sounds like orthostatic dizziness but some does not. Will check u/s of carotids/vertebrals. She has f/u with her cardiologist, Dr. Orene Desanctis, on 11/16/14.  2) Right scalp pain/paroxysmal hemicrania: unknown etiology.  Doubt this is related to the skin lesions in the area. Reassured, watchful waiting approach recommended.  An After Visit Summary was printed and given to the patient.  Spent 25 min with pt today, with >50% of this time spent in counseling and care coordination regarding the above problems.  FOLLOW UP: Return if symptoms worsen or fail to improve.

## 2014-11-13 NOTE — Addendum Note (Signed)
Addended by: Lanae Crumbly on: 11/13/2014 02:49 PM   Modules accepted: Orders

## 2014-11-13 NOTE — Progress Notes (Signed)
Pre visit review using our clinic review tool, if applicable. No additional management support is needed unless otherwise documented below in the visit note. 

## 2014-11-14 ENCOUNTER — Other Ambulatory Visit: Payer: Self-pay | Admitting: *Deleted

## 2014-11-14 MED ORDER — OMEPRAZOLE 20 MG PO CPDR: 20.0000 mg | DELAYED_RELEASE_CAPSULE | Freq: Every day | ORAL | Status: DC | PRN

## 2014-11-15 ENCOUNTER — Other Ambulatory Visit: Payer: Self-pay | Admitting: Family Medicine

## 2014-11-15 DIAGNOSIS — E878 Other disorders of electrolyte and fluid balance, not elsewhere classified: Secondary | ICD-10-CM

## 2014-11-15 DIAGNOSIS — R42 Dizziness and giddiness: Secondary | ICD-10-CM

## 2014-11-16 ENCOUNTER — Ambulatory Visit (INDEPENDENT_AMBULATORY_CARE_PROVIDER_SITE_OTHER): Payer: Medicare Other | Admitting: Cardiovascular Disease

## 2014-11-16 ENCOUNTER — Encounter: Payer: Self-pay | Admitting: Cardiovascular Disease

## 2014-11-16 VITALS — BP 140/80 | HR 73 | Resp 16 | Ht 60.0 in | Wt 150.0 lb

## 2014-11-16 DIAGNOSIS — I428 Other cardiomyopathies: Secondary | ICD-10-CM

## 2014-11-16 DIAGNOSIS — I447 Left bundle-branch block, unspecified: Secondary | ICD-10-CM | POA: Diagnosis not present

## 2014-11-16 DIAGNOSIS — I10 Essential (primary) hypertension: Secondary | ICD-10-CM

## 2014-11-16 DIAGNOSIS — I429 Cardiomyopathy, unspecified: Secondary | ICD-10-CM | POA: Diagnosis not present

## 2014-11-16 DIAGNOSIS — E785 Hyperlipidemia, unspecified: Secondary | ICD-10-CM | POA: Diagnosis not present

## 2014-11-16 NOTE — Patient Instructions (Signed)
Dr. Sallyanne Kuster recommends that you schedule a follow-up appointment in: ONE YEAR  Dr. Sallyanne Kuster suggest you see an ENT or Neurologist

## 2014-11-16 NOTE — Progress Notes (Signed)
Patient ID: Madison White, female   DOB: [birth year], 81 y.o.   MRN: [id number]     Cardiology Office Note   Date:  11/17/2014   ID:  Madison White, DOB 1928/09/28, MRN [id number]  PCP:  Tammi Sou, MD  Cardiologist:   Sanda Klein, MD   Chief Complaint  Patient presents with  . Annual Exam    Patient has felt light headed, dizzy, has had pain in her head, and has numbness in her left arm.      History of Present Illness: Madison White is a 81 y.o. female who presents for follow-up for mild nonischemic cardiomyopathy, chronic left bundle branch block, hyperlipidemia who presents for routine follow-up. She does not have any signs or symptoms of congestive heart failure even though she is not particularly attentive to sodium restriction. Left ventricular ejection fraction has been estimated to be around 50% by echocardiography (nuclear scintigraphy showed an EF of 39%). Both studies showed left bundle branch block related septal dyssynchrony. She did not have any perfusion abnormalities on the nuclear stress test and has never described angina pectoris.  Her blood pressures borderline high today but at home it is consistently in the 130s/70s. She has no cardiac complaints.  She does describe recurrent episodes of sharp shooting pain in her right scalp in the parietal area as well as problems with equilibrium. She immediately becomes dizzy and unsteady if she looks up or if she turns over in bed. It seems that she is describing true vertigo. She does not have nausea or vomiting. She is not taking any antihypertensives medications or really any drugs that would be expected to cause blood pressures shifts.    Past Medical History  Diagnosis Date  . Insulin resistance     A1c's excellent per old records (6.1 in 2008 and 2009)  . HTN (hypertension)     hx of refusing treatment--White coat HTN (?)   . Hyperlipidemia     hx of refusing treatment  . GERD (gastroesophageal reflux  disease)   . History of rheumatic fever   . History of blood transfusion   . Seasonal allergies   . Osteoarthritis   . Left bundle branch block 12/2011    Dr. Sallyanne Kuster at [facility]; ECHO and myocardial perfusion scan showed  septal and apical wall motion abnormality but she had no significant valvular disease and no ischemia.  She did have mildly decreased EF (39% on lexiscan and 50% on echo) and abnl LV relaxation.  Mild amount of PVCs.  Pt at higher risk for other conduction abnormalities, good chance of eventually requiring a pacemaker.  [name] LVH (left ventricular hypertrophy)   . Diastolic dysfunction, left ventricle     Past Surgical History  Procedure Laterality Date  . Cesarean section  X 5    One of her neonates died soon after birth  . Strabismus surgery  1939  . Tonsillectomy  1938  . Abdominal hysterectomy  1963    At the time of her last C/S; says she had partial bladder resection at that time as well  . Cardiovascular stress test  08/2005 & 12/2011    Low risk scans (on the 12/2011 scan she did have EF 39% with moderately severe LV dysfunction with septal dyssynergy probably contributed by LBBB  . Cataract extraction  08/14/11    both  . Transthoracic echocardiogram  01/13/12    Septal dyssynergy, EF 50%, LV relaxation impaired.  No significant valvular abnormalities.  Current Outpatient Prescriptions  Medication Sig Dispense Refill  . aspirin 81 MG tablet Take 1 tablet (81 mg total) by mouth daily. 30 tablet 1  . Calcium Carbonate-Vitamin D (CALCIUM 600 + D PO) Take 1 tablet by mouth daily.    . Cyanocobalamin (VITAMIN B-12 CR PO) Take by mouth.    . fexofenadine (ALLEGRA) 180 MG tablet Take 180 mg by mouth daily.    . fish oil-omega-3 fatty acids 1000 MG capsule Take 2 g by mouth daily.    [name] ibuprofen (ADVIL,MOTRIN) 200 MG tablet Take 200 mg by mouth 2 (two) times daily.    . Multiple Vitamins-Minerals (CENTRUM PO) Take by mouth.    [name] omeprazole (PRILOSEC) 20 MG capsule  Take 1 capsule (20 mg total) by mouth daily as needed. 30 capsule 3  . vitamin E 1000 UNIT capsule Take 1,000 Units by mouth daily.     No current facility-administered medications for this visit.    Allergies:   Avapro; Lisinopril; Penicillins; Simvastatin; and Sulfa antibiotics    Social History:  The patient  reports that she quit smoking about 56 years ago. She has never used smokeless tobacco.   Family History:  The patient's family history includes Cancer in her father; Diabetes in her mother; Heart disease in her father and mother; Hyperlipidemia in her mother; Hypertension in her mother.    ROS:  Please see the history of present illness.    Otherwise, review of systems positive for none.   All other systems are reviewed and negative.    PHYSICAL EXAM: VS:  BP 140/80 mmHg  Pulse 73  Resp 16  Ht 5' (1.524 m)  Wt 150 lb (68.04 kg)  BMI 29.30 kg/m2 , BMI Body mass index is 29.3 kg/(m^2).  General: Alert, oriented x3, no distress Head: no evidence of trauma, PERRL, EOMI, no exophtalmos or lid lag, no myxedema, no xanthelasma; normal ears, nose and oropharynx Neck: normal jugular venous pulsations and no hepatojugular reflux; brisk carotid pulses without delay and no carotid bruits Chest: clear to auscultation, no signs of consolidation by percussion or palpation, normal fremitus, symmetrical and full respiratory excursions Cardiovascular: normal position and quality of the apical impulse, regular rhythm, normal first and paradoxically split second heart sounds, no murmurs, rubs or gallops Abdomen: no tenderness or distention, no masses by palpation, no abnormal pulsatility or arterial bruits, normal bowel sounds, no hepatosplenomegaly Extremities: no clubbing, cyanosis or edema; 2+ radial, ulnar and brachial pulses bilaterally; 2+ right femoral, posterior tibial and dorsalis pedis pulses; 2+ left femoral, posterior tibial and dorsalis pedis pulses; no subclavian or femoral  bruits Neurological: grossly nonfocal Psych: euthymic mood, full affect   EKG:  EKG is ordered today. The ekg ordered today demonstrates sinus rhythm, first-degree AV block (PR 258 ms), left axis deviation, left bundle branch block, QTC 480 ms   Recent Labs: No results found for requested labs within last 365 days.    Lipid Panel    Component Value Date/Time   CHOL 208* 05/29/2011 0810   TRIG 93.0 05/29/2011 0810   HDL 70.40 05/29/2011 0810   CHOLHDL 3 05/29/2011 0810   VLDL 18.6 05/29/2011 0810   LDLDIRECT 117.9 05/29/2011 0810      Wt Readings from Last 3 Encounters:  11/16/14 150 lb (68.04 kg)  11/13/14 151 lb (68.493 kg)  02/20/14 155 lb (70.308 kg)      ASSESSMENT AND PLAN:  1. Mild nonischemic cardiomyopathy - the reduction in left ventricular systolic function is  probably mostly if not entirely due to left bundle branch block related dyssynchrony. She has no signs or symptoms of congestive heart failure. She does not have syncope or documented bradycardia or high-grade AV block. Pacemaker therapy and treatment for congestive heart failure do not appear to be necessary at this time. Her blood pressure is borderline today but consistently low or at home. She has repeatedly expressed a desire not to take medications for blood pressure or dyslipidemia.  2. 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 There are no abnormalities on bedside examination of the cranial nerves and she denies auditory difficulties. I'm not sure if imaging studies are necessary and have recommended that she see an otorhinolaryngologist or neurologist.   Current medicines are reviewed at length with the patient today.  The patient does not have concerns regarding medicines.  The following changes have been made:  no change  Labs/ tests ordered today include:  Orders Placed This Encounter  Procedures   . EKG 12-Lead     Patient Instructions  Dr. Sallyanne Kuster recommends that you schedule a follow-up appointment in: ONE YEAR  Dr. Sallyanne Kuster suggest you see an ENT or Neurologist       Signed, Sanda Klein, MD  11/17/2014 11:33 AM    Sanda Klein, MD, [facility] [phone] office [phone] pager

## 2014-11-17 ENCOUNTER — Telehealth: Payer: Self-pay | Admitting: Family Medicine

## 2014-11-17 ENCOUNTER — Encounter: Payer: Self-pay | Admitting: Cardiovascular Disease

## 2014-11-17 DIAGNOSIS — G44031 Episodic paroxysmal hemicrania, intractable: Secondary | ICD-10-CM

## 2014-11-17 DIAGNOSIS — R030 Elevated blood-pressure reading, without diagnosis of hypertension: Secondary | ICD-10-CM

## 2014-11-17 DIAGNOSIS — I428 Other cardiomyopathies: Secondary | ICD-10-CM | POA: Insufficient documentation

## 2014-11-17 NOTE — Telephone Encounter (Signed)
Pt.daughter is requesting an MRI for Resa as well as the Sonogram she is scheduled for on Tuesday Sept 6. Her daughter states that the pain in Teonna's head is happening every hour. Please call daughter Juliann Pulse at [phone]

## 2014-11-17 NOTE — Telephone Encounter (Signed)
Spoke with daughter, Juliann Pulse. Given suspected possible dx of paroxysmal hemicrania, I think MRI brain is a reasonable test to do, but pt will need renal function check before she can get gadolinium. She'll make lab appt to get BMET and if/when Cr is found to be ok then will order MRI brain with and without contrast. If MRI normal, need to consider indomethacin trial for possible paroxysmal hemicrania VS tegretol trial for possible trigeminal neuralgia (ophthalmic branch).  Ultimately, may have to have neurology see her to help with this problem.

## 2014-11-17 NOTE — Telephone Encounter (Signed)
Please advise. Thanks.  

## 2014-11-21 ENCOUNTER — Encounter: Payer: Self-pay | Admitting: Family Medicine

## 2014-11-21 ENCOUNTER — Ambulatory Visit (HOSPITAL_COMMUNITY)
Admission: RE | Admit: 2014-11-21 | Discharge: 2014-11-21 | Disposition: A | Discharge status: Home / Self Care | Payer: Medicare Other | Source: Ambulatory Visit | Attending: Family Medicine | Admitting: Family Medicine

## 2014-11-21 ENCOUNTER — Other Ambulatory Visit: Payer: Self-pay | Admitting: Family Medicine

## 2014-11-21 ENCOUNTER — Other Ambulatory Visit (INDEPENDENT_AMBULATORY_CARE_PROVIDER_SITE_OTHER): Payer: Medicare Other

## 2014-11-21 DIAGNOSIS — F17200 Nicotine dependence, unspecified, uncomplicated: Secondary | ICD-10-CM | POA: Diagnosis not present

## 2014-11-21 DIAGNOSIS — R030 Elevated blood-pressure reading, without diagnosis of hypertension: Secondary | ICD-10-CM | POA: Diagnosis not present

## 2014-11-21 DIAGNOSIS — I10 Essential (primary) hypertension: Secondary | ICD-10-CM | POA: Insufficient documentation

## 2014-11-21 DIAGNOSIS — I6523 Occlusion and stenosis of bilateral carotid arteries: Secondary | ICD-10-CM | POA: Insufficient documentation

## 2014-11-21 DIAGNOSIS — R42 Dizziness and giddiness: Secondary | ICD-10-CM | POA: Diagnosis not present

## 2014-11-21 DIAGNOSIS — E785 Hyperlipidemia, unspecified: Secondary | ICD-10-CM | POA: Insufficient documentation

## 2014-11-21 DIAGNOSIS — G44031 Episodic paroxysmal hemicrania, intractable: Secondary | ICD-10-CM

## 2014-11-21 DIAGNOSIS — E878 Other disorders of electrolyte and fluid balance, not elsewhere classified: Secondary | ICD-10-CM

## 2014-11-21 LAB — BASIC METABOLIC PANEL
BUN: 13 mg/dL (ref 6–23)
CALCIUM: 9.7 mg/dL (ref 8.4–10.5)
CO2: 30 mEq/L (ref 19–32)
CREATININE: 0.86 mg/dL (ref 0.40–1.20)
Chloride: 104 mEq/L (ref 96–112)
GFR: 66.82 mL/min (ref >60.00)
Glucose, Bld: 108 mg/dL — ABNORMAL HIGH (ref 70–99)
Potassium: 4.9 mEq/L (ref 3.5–5.1)
SODIUM: 143 meq/L (ref 135–145)

## 2014-11-22 ENCOUNTER — Encounter (HOSPITAL_COMMUNITY): Payer: Self-pay

## 2014-11-27 ENCOUNTER — Encounter (HOSPITAL_COMMUNITY): Payer: Self-pay

## 2014-12-02 ENCOUNTER — Ambulatory Visit (HOSPITAL_BASED_OUTPATIENT_CLINIC_OR_DEPARTMENT_OTHER)
Admission: RE | Admit: 2014-12-02 | Discharge: 2014-12-02 | Disposition: A | Discharge status: Home / Self Care | Payer: Medicare Other | Source: Ambulatory Visit | Attending: Family Medicine | Admitting: Family Medicine

## 2014-12-02 DIAGNOSIS — R200 Anesthesia of skin: Secondary | ICD-10-CM | POA: Diagnosis not present

## 2014-12-02 DIAGNOSIS — H538 Other visual disturbances: Secondary | ICD-10-CM | POA: Insufficient documentation

## 2014-12-02 DIAGNOSIS — G319 Degenerative disease of nervous system, unspecified: Secondary | ICD-10-CM | POA: Insufficient documentation

## 2014-12-02 DIAGNOSIS — R42 Dizziness and giddiness: Secondary | ICD-10-CM | POA: Insufficient documentation

## 2014-12-02 DIAGNOSIS — G44031 Episodic paroxysmal hemicrania, intractable: Secondary | ICD-10-CM

## 2014-12-02 DIAGNOSIS — R51 Headache: Secondary | ICD-10-CM | POA: Insufficient documentation

## 2014-12-02 DIAGNOSIS — R413 Other amnesia: Secondary | ICD-10-CM | POA: Diagnosis not present

## 2014-12-03 ENCOUNTER — Encounter: Payer: Self-pay | Admitting: Family Medicine

## 2014-12-04 ENCOUNTER — Telehealth: Payer: Self-pay | Admitting: Family Medicine

## 2014-12-04 DIAGNOSIS — G44039 Episodic paroxysmal hemicrania, not intractable: Secondary | ICD-10-CM

## 2014-12-04 DIAGNOSIS — R519 Headache, unspecified: Secondary | ICD-10-CM

## 2014-12-04 DIAGNOSIS — R51 Headache: Principal | ICD-10-CM

## 2014-12-04 NOTE — Telephone Encounter (Signed)
Patient's daughter is checking on referral to neurologist.

## 2014-12-04 NOTE — Telephone Encounter (Signed)
Pts daughter would like for pt to go ahead with the referral to neurologist. Please place order and let Beverlee Nims know that pts daughter would like pt to be seen as soon as possible. Thanks.

## 2014-12-04 NOTE — Telephone Encounter (Signed)
Left message for Madison White to call back

## 2014-12-04 NOTE — Telephone Encounter (Signed)
OK, neurology referral ordered as per pt request.

## 2014-12-04 NOTE — Telephone Encounter (Signed)
Please call daughter when this has been done. Thanks.  

## 2014-12-13 ENCOUNTER — Encounter: Payer: Self-pay | Admitting: Family Medicine

## 2014-12-19 ENCOUNTER — Ambulatory Visit (INDEPENDENT_AMBULATORY_CARE_PROVIDER_SITE_OTHER): Payer: Medicare Other | Admitting: Neurology

## 2014-12-19 ENCOUNTER — Encounter: Payer: Self-pay | Admitting: Neurology

## 2014-12-19 ENCOUNTER — Other Ambulatory Visit: Payer: Self-pay | Admitting: Neurology

## 2014-12-19 VITALS — BP 148/90 | HR 103 | Ht 60.0 in | Wt 152.5 lb

## 2014-12-19 DIAGNOSIS — I679 Cerebrovascular disease, unspecified: Secondary | ICD-10-CM | POA: Diagnosis not present

## 2014-12-19 DIAGNOSIS — H8110 Benign paroxysmal vertigo, unspecified ear: Secondary | ICD-10-CM | POA: Diagnosis not present

## 2014-12-19 DIAGNOSIS — I10 Essential (primary) hypertension: Secondary | ICD-10-CM | POA: Diagnosis not present

## 2014-12-19 DIAGNOSIS — G4485 Primary stabbing headache: Secondary | ICD-10-CM

## 2014-12-19 MED ORDER — GABAPENTIN 100 MG PO CAPS: ORAL_CAPSULE | ORAL | Status: DC

## 2014-12-19 NOTE — Patient Instructions (Addendum)
1.  To treat the headache, we will start gabapentin 100mg  capsules.  Take 1 capsule twice daily for 7 days,   Then 2 capsules twice daily for 7 days,  Then 3 capsules twice daily for 7 days,  Then 4 capsules twice daily 2.  We will check a sed rate 3.  Follow up in 4 weeks 4.  See your ophthalmologist. 5.  Have blood pressure rechecked with PCP (it is elevated today)

## 2014-12-19 NOTE — Progress Notes (Signed)
NEUROLOGY CONSULTATION NOTE  Madison White MRN: [id number] DOB: 08-Jan-1929  Referring provider: Dr. Anitra Lauth Primary care provider: Dr. Anitra Lauth  Reason for consult:  headache  HISTORY OF PRESENT ILLNESS: Madison White is an 81 year old right-handed female with hypertension, hyperlipidemia, left bundle branch block, nonischemic cardiomyopathy, and history of rheumatic fever who presents for headache.  History obtained by patient and her granddaughter.  Labs and images of brain MRI personally reviewed.  She began experiencing headaches in August.  She describes a severe stabbing pain in the right frontal and parietal region.  It is paroxysmal and occurs throughout the day.  Initially, it was once every two hours.  Now, it is every couple of minutes.  She also has a dull aching pain in the temple and back of the head on the right.  She also reports blurred vision only in the right eye, which comes and goes.  She also notices it if she had been reading.  There is no associated autonomic symptoms such as ptosis, pupil asymmetry or lacrimation.  There is no associated nausea or numbness and tingling.  She had not had any rash on the head.  She denies neck pain.  She has no prior history of headache.  She also has been experiencing dizzy spells.  She describes a spinning sensation lasting seconds and triggered with movement or change in position.  MRI of the brain with and without contrast was performed on 12/02/14, which showed generalized atrophy and old lacunes in the cerebral white matter.  Labs from 11/21/14 show Na 143, K 4.9, Cl 104, CO2 30, glucose 108, BUN 13, Cr 0.86 and Ca 9.7  PAST MEDICAL HISTORY: Past Medical History  Diagnosis Date  . Insulin resistance     A1c's excellent per old records (6.1 in 2008 and 2009)  . HTN (hypertension)     hx of refusing treatment--White coat HTN (?)   . Hyperlipidemia     hx of refusing treatment  . GERD (gastroesophageal reflux disease)   .  History of rheumatic fever   . History of blood transfusion   . Seasonal allergies   . Osteoarthritis   . Left bundle branch block 12/2011    Dr. Sallyanne Kuster at [facility]; ECHO and myocardial perfusion scan showed  septal and apical wall motion abnormality but she had no significant valvular disease and no ischemia.  She did have mildly decreased EF (39% on lexiscan and 50% on echo) and abnl LV relaxation.  Mild amount of PVCs.  Pt at higher risk for other conduction abnormalities, good chance of eventually requiring a pacemaker.   [name] LVH (left ventricular hypertrophy)   . Diastolic dysfunction, left ventricle   . Nonischemic cardiomyopathy ([place]) 2016    LV dysfunction due to LBBB/septal dyssynchrony  . Headache disorder 2016    R frontoparietal pain, episodic (paroxysmal hemicrania vs trig neuralgia of ophth br of CN V)--MRI brain 11/2014 showed age related changes but no explanation for her HA's.    PAST SURGICAL HISTORY: Past Surgical History  Procedure Laterality Date  . Cesarean section  X 5    One of her neonates died soon after birth  . Strabismus surgery  1939  . Tonsillectomy  1938  . Abdominal hysterectomy  1963    At the time of her last C/S; says she had partial bladder resection at that time as well  . Cardiovascular stress test  08/2005 & 12/2011    Low risk scans (on the 12/2011  scan she did have EF 39% with moderately severe LV dysfunction with septal dyssynergy probably contributed by LBBB  . Cataract extraction  08/14/11    both  . Transthoracic echocardiogram  01/13/12    Septal dyssynergy, EF 50%, LV relaxation impaired.  No significant valvular abnormalities.  . Carotid dopplers  11/22/14    NORMAL    MEDICATIONS: Current Outpatient Prescriptions on File Prior to Visit  Medication Sig Dispense Refill  . aspirin 81 MG tablet Take 1 tablet (81 mg total) by mouth daily. 30 tablet 1  . Calcium Carbonate-Vitamin D (CALCIUM 600 + D PO) Take 1 tablet by mouth daily.    .  Cyanocobalamin (VITAMIN B-12 CR PO) Take by mouth.    . fexofenadine (ALLEGRA) 180 MG tablet Take 180 mg by mouth daily.    . fish oil-omega-3 fatty acids 1000 MG capsule Take 2 g by mouth daily.    [name] ibuprofen (ADVIL,MOTRIN) 200 MG tablet Take 200 mg by mouth 2 (two) times daily.    . Multiple Vitamins-Minerals (CENTRUM PO) Take by mouth.    [name] omeprazole (PRILOSEC) 20 MG capsule Take 1 capsule (20 mg total) by mouth daily as needed. 30 capsule 3  . vitamin E 1000 UNIT capsule Take 1,000 Units by mouth daily.     No current facility-administered medications on file prior to visit.    ALLERGIES: Allergies  Allergen Reactions  . Avapro [Irbesartan] Other (See Comments)    Unknown rxn  . Lisinopril Swelling    Swelling around eyes; also says it caused increased sugar and BP  . Penicillins Hives  . Simvastatin Other (See Comments)    unknown  . Sulfa Antibiotics Rash    FAMILY HISTORY: Family History  Problem Relation Age of Onset  . Hypertension Mother   . Heart disease Mother   . Hyperlipidemia Mother   . Diabetes Mother   . Heart disease Father   . Cancer Father     SOCIAL HISTORY: Social History   Social History  . Marital Status: Widowed    Spouse Name: N/A  . Number of Children: N/A  . Years of Education: N/A   Occupational History  . Not on file.   Social History Main Topics  . Smoking status: Former Smoker    Quit date: 05/31/1958  . Smokeless tobacco: Never Used  . Alcohol Use: 0.0 oz/week    0 Standard drinks or equivalent per week  . Drug Use: No  . Sexual Activity: Not on file   Other Topics Concern  . Not on file   Social History Narrative   Widower, husband died around 51 (wrongful death per pt's report).   Originally from [place], has been in [place] since about 1997.   Lives alone.  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   Fairly active and independent.   Distant hx of tobacco abuse.  No alcohol or drugs.     REVIEW OF SYSTEMS: Constitutional: No fevers, chills, or sweats, no generalized fatigue, change in appetite Eyes: as above Ear, nose and throat: No hearing loss, ear pain, nasal congestion, sore throat Cardiovascular: No chest pain, palpitations Respiratory:  No shortness of breath at rest or with exertion, wheezes GastrointestinaI: No nausea, vomiting, diarrhea, abdominal pain, fecal incontinence Genitourinary:  No dysuria, urinary retention or frequency Musculoskeletal:  No neck pain, back pain Integumentary: No rash, pruritus, skin lesions Neurological: as above Psychiatric: No depression, insomnia, anxiety Endocrine: No palpitations, fatigue, diaphoresis, mood swings,  change in appetite, change in weight, increased thirst Hematologic/Lymphatic:  No anemia, purpura, petechiae. Allergic/Immunologic: no itchy/runny eyes, nasal congestion, recent allergic reactions, rashes  PHYSICAL EXAM: Filed Vitals:   12/19/14 1220  BP: 148/90  Pulse: 103   General: No acute distress.  Patient appears well-groomed. Head:  Normocephalic/atraumatic Eyes:  fundi unremarkable, without vessel changes, exudates, hemorrhages or papilledema. Neck: supple, no paraspinal tenderness, full range of motion Back: No paraspinal tenderness Heart: regular rate and rhythm Lungs: Clear to auscultation bilaterally. Vascular: No carotid bruits. Neurological Exam: Mental status: alert and oriented to person, place, and time, recent and remote memory intact, fund of knowledge intact, attention and concentration intact, speech fluent and not dysarthric, language intact. Cranial nerves: CN I: not tested CN II: pupils equal, round and reactive to light, visual fields intact, fundi unremarkable, without vessel changes, exudates, hemorrhages or papilledema. CN III, IV, VI:  full range of motion, no nystagmus, no ptosis CN V: facial sensation intact CN VII: upper and lower face symmetric CN VIII: hearing intact CN  IX, X: gag intact, uvula midline CN XI: sternocleidomastoid and trapezius muscles intact CN XII: tongue midline Bulk & Tone: normal, no fasciculations. Motor:  5/5 throughout Sensation:  Pinprick and vibration sensation intact. Deep Tendon Reflexes:  2+ throughout, toes downgoing.  Finger to nose testing:  Without dysmetria.  Heel to shin:  Without dysmetria.  Gait:  Wide-based gait.  Able to turn.  Unable to tandem walk. Romberg positive.  IMPRESSION: New-onset unilateral headache.  Differential includes primary stabbing headache versus paroxysmal hemicrania (although she lacks autonomic symptoms).  With altered vision, temporal arteritis must be ruled out. Cerebrovascular disease Benign positional paroxysmal vertigo Hypertension  PLAN: 1.  Will start gabapentin, titrating from 100mg  twice daily up to 400mg  twice daily 2.  Will check sed rate today 3.  Advised to start ASA 81mg  daily 4.  Blood pressure elevated today.  Recommend recheck with PCP 5.  If vertigo persists, consider vestibular rehab 6.  Advised that she have another formal eye exam. 7.  Will see her back in 4 weeks.  Thank you for allowing me to take part in the care of this patient.  Metta Clines, DO  CC:  Shawnie Dapper, MD

## 2014-12-20 LAB — SEDIMENTATION RATE: Sed Rate: 4 mm/hr (ref 0–20)

## 2014-12-25 ENCOUNTER — Telehealth: Payer: Self-pay | Admitting: Neurology

## 2014-12-25 NOTE — Telephone Encounter (Signed)
Pt/ grandaughter/[name]/ called for last lab results//call back @ [phone]

## 2014-12-25 NOTE — Telephone Encounter (Signed)
Madison White, I tried to call this patient and her person [name] that is on her Release of Information form. No Answer.   I do not see anywhere in the chart that I can speak with a [name] , Maybe you can help me , maybe Im overlooking something  Thanks

## 2014-12-25 NOTE — Telephone Encounter (Signed)
Madison White, Patient was suppose to have had a SED RATE drawn with you was with Dr Tomi Likens on 12/19/2014.. The patient said you gave her a order to take across the street to the [place] lab. I do not see any results in the computer for her Sed Rate Can you help ? If you find results will you contact Juliann Pulse the daughter

## 2014-12-26 ENCOUNTER — Telehealth: Payer: Self-pay | Admitting: Family Medicine

## 2014-12-26 DIAGNOSIS — Z961 Presence of intraocular lens: Secondary | ICD-10-CM | POA: Diagnosis not present

## 2014-12-26 DIAGNOSIS — H16223 Keratoconjunctivitis sicca, not specified as Sjogren's, bilateral: Secondary | ICD-10-CM | POA: Diagnosis not present

## 2014-12-26 NOTE — Telephone Encounter (Signed)
Called patient and left message giving her the lab result.

## 2014-12-26 NOTE — Telephone Encounter (Signed)
Please advise. Thanks.  

## 2014-12-26 NOTE — Telephone Encounter (Signed)
I attempted to call daughter.  No answer and no vm.

## 2014-12-26 NOTE — Telephone Encounter (Signed)
Patient's granddaughter Erasmo Downer called & made an appointment for the patient. Patient's chiropractor Dr. Purcell Nails [facility] [phone] would like to review copies of an xray of her neck per patient. She has been seeing him for her headaches.

## 2014-12-28 ENCOUNTER — Ambulatory Visit (INDEPENDENT_AMBULATORY_CARE_PROVIDER_SITE_OTHER): Payer: Medicare Other | Admitting: Family Medicine

## 2014-12-28 ENCOUNTER — Encounter: Payer: Self-pay | Admitting: Family Medicine

## 2014-12-28 ENCOUNTER — Telehealth: Payer: Self-pay | Admitting: Neurology

## 2014-12-28 VITALS — BP 160/90 | HR 74 | Temp 98.3°F | Resp 16 | Ht 60.0 in | Wt 153.0 lb

## 2014-12-28 DIAGNOSIS — Z23 Encounter for immunization: Secondary | ICD-10-CM | POA: Diagnosis not present

## 2014-12-28 DIAGNOSIS — R51 Headache: Secondary | ICD-10-CM | POA: Diagnosis not present

## 2014-12-28 DIAGNOSIS — R519 Headache, unspecified: Secondary | ICD-10-CM

## 2014-12-28 NOTE — Progress Notes (Signed)
Pre visit review using our clinic review tool, if applicable. No additional management support is needed unless otherwise documented below in the visit note. 

## 2014-12-28 NOTE — Telephone Encounter (Signed)
Please advise 

## 2014-12-28 NOTE — Progress Notes (Signed)
OFFICE VISIT  12/28/2014   CC:  Chief Complaint  Patient presents with  . Follow-up    Headaches, referral to Dr. Purcell Nails in [place], cervical xrays needed   HPI:    Patient is a 81 y.o. Caucasian female who presents with a female family member for f/u of headaches.   She did see neurologist, Dr. Tomi Likens, for these HA's on 12/19/14 and I reviewed his note.   She is working on titrating up on the neurontin that he rx'd her.  She requests (per granddaughters directions) a referral to Dr. Owens Shark with [facility] to get an opinion on these HAs, thinks maybe a possible neck problem may be the etiology.  No neck pain.  Occasional occipital component to the HA on R side.  Often says she rolls over in bed at night and wakes up and it hurts on the R forehead "like a brain freeze" and extends back to R parietal region in a sharp/jabbing pain that is brief.  No sensation of temples or entire head throbbing.  No nausea or photo/phonophobia.   Home bp's: she assures me it is normal when she checks it at home.  Past Medical History  Diagnosis Date  . Insulin resistance     A1c's excellent per old records (6.1 in 2008 and 2009)  . HTN (hypertension)     hx of refusing treatment--White coat HTN (?)   . Hyperlipidemia     hx of refusing treatment  . GERD (gastroesophageal reflux disease)   . History of rheumatic fever   . History of blood transfusion   . Seasonal allergies   . Osteoarthritis   . Left bundle branch block 12/2011    Dr. Sallyanne Kuster at [facility]; ECHO and myocardial perfusion scan showed  septal and apical wall motion abnormality but she had no significant valvular disease and no ischemia.  She did have mildly decreased EF (39% on lexiscan and 50% on echo) and abnl LV relaxation.  Mild amount of PVCs.  Pt at higher risk for other conduction abnormalities, good chance of eventually requiring a pacemaker.   [name] LVH (left ventricular hypertrophy)   . Diastolic dysfunction,  left ventricle   . Nonischemic cardiomyopathy ([place]) 2016    LV dysfunction due to LBBB/septal dyssynchrony  . Headache disorder 2016    R frontoparietal pain, episodic (paroxysmal hemicrania vs trig neuralgia of ophth br of CN V)--MRI brain 11/2014 showed age related changes but no explanation for her HA's.    Past Surgical History  Procedure Laterality Date  . Cesarean section  X 5    One of her neonates died soon after birth  . Strabismus surgery  1939  . Tonsillectomy  1938  . Abdominal hysterectomy  1963    At the time of her last C/S; says she had partial bladder resection at that time as well  . Cardiovascular stress test  08/2005 & 12/2011    Low risk scans (on the 12/2011 scan she did have EF 39% with moderately severe LV dysfunction with septal dyssynergy probably contributed by LBBB  . Cataract extraction  08/14/11    both  . Transthoracic echocardiogram  01/13/12    Septal dyssynergy, EF 50%, LV relaxation impaired.  No significant valvular abnormalities.  . Carotid dopplers  11/22/14    NORMAL    Outpatient Prescriptions Prior to Visit  Medication Sig Dispense Refill  . aspirin 81 MG tablet Take 1 tablet (81 mg total) by mouth daily. 30 tablet 1  .  Calcium Carbonate-Vitamin D (CALCIUM 600 + D PO) Take 1 tablet by mouth daily.    . Cyanocobalamin (VITAMIN B-12 CR PO) Take by mouth.    . fexofenadine (ALLEGRA) 180 MG tablet Take 180 mg by mouth daily.    . fish oil-omega-3 fatty acids 1000 MG capsule Take 2 g by mouth daily.    [name] gabapentin (NEURONTIN) 100 MG capsule Take 1cap BID x7d, then 2caps BID x7d, then 3caps BID x7d, then 4caps BID 240 capsule 0  . ibuprofen (ADVIL,MOTRIN) 200 MG tablet Take 200 mg by mouth 2 (two) times daily.    . Multiple Vitamins-Minerals (CENTRUM PO) Take by mouth.    [name] omeprazole (PRILOSEC) 20 MG capsule Take 1 capsule (20 mg total) by mouth daily as needed. 30 capsule 3  . vitamin E 1000 UNIT capsule Take 1,000 Units by mouth daily.     No  facility-administered medications prior to visit.    Allergies  Allergen Reactions  . Avapro [Irbesartan] Other (See Comments)    Unknown rxn  . Lisinopril Swelling    Swelling around eyes; also says it caused increased sugar and BP  . Penicillins Hives  . Simvastatin Other (See Comments)    unknown  . Sulfa Antibiotics Rash    ROS As per HPI  PE: Blood pressure 160/90, pulse 74, temperature 98.3 F (36.8 C), temperature source Oral, resp. rate 16, height 5' (1.524 m), weight 153 lb (69.4 kg), SpO2 92 %. Gen: Alert, well appearing.  Patient is oriented to person, place, time, and situation. AFFECT: pleasant, lucid thought and speech. No tenderness to palpation anywhere on head/forehead/temples.   Neck with full ROM with no pain except mild pulling sensation in R neck region when bending laterally to the left, without tenderness anywhere in neck or occiput.      LABS:    Chemistry      Component Value Date/Time   NA 143 11/21/2014 0853   K 4.9 11/21/2014 0853   CL 104 11/21/2014 0853   CO2 30 11/21/2014 0853   BUN 13 11/21/2014 0853   CREATININE 0.86 11/21/2014 0853      Component Value Date/Time   CALCIUM 9.7 11/21/2014 0853   ALKPHOS 43 05/29/2011 0810   AST 24 05/29/2011 0810   ALT 13 05/29/2011 0810   BILITOT 0.3 05/29/2011 0810       IMPRESSION AND PLAN:  1) Atypical HAs: stabbing type HA vs paroxysmal hemicrania. Neuro has started her on neurontin and she is titrating this up as directed and has f/u set with Dr. Tomi Likens. In the meantime, I see no harm in her seeing the chiropracter to search for other possible clues to her HA's (although no neck pain and rare occipital pain present).  Referral made to Dr. Owens Shark as per pt's request, as well as c-spine plain films ordered as per pt's request. No new meds rx'd today.  2) White coat hypertension: pt has normal bp at home--she insists she has white coat syndrome and has long been declining any trial of  antihypertensive medication from both me and her cardiologist.  3) Prev health care: prevnar 13 and flu vaccine given today.  An After Visit Summary was printed and given to the patient.  FOLLOW UP: Return if symptoms worsen or fail to improve.

## 2014-12-28 NOTE — Telephone Encounter (Signed)
Called patient back and left message for her to call me.   

## 2014-12-28 NOTE — Telephone Encounter (Signed)
Pt/called for lab results/ call back @ [phone]

## 2014-12-28 NOTE — Telephone Encounter (Signed)
Sed Rate is normal.

## 2014-12-28 NOTE — Telephone Encounter (Signed)
Pt daughter/Kathleen/called for MRI Results/ call back @ [phone]

## 2014-12-29 NOTE — Telephone Encounter (Signed)
[name] notified that sed rate was normal but no MRI ordered by Dr. Tomi Likens.

## 2015-01-01 ENCOUNTER — Ambulatory Visit (HOSPITAL_BASED_OUTPATIENT_CLINIC_OR_DEPARTMENT_OTHER)
Admission: RE | Admit: 2015-01-01 | Discharge: 2015-01-01 | Disposition: A | Discharge status: Home / Self Care | Payer: Medicare Other | Source: Ambulatory Visit | Attending: Family Medicine | Admitting: Family Medicine

## 2015-01-01 DIAGNOSIS — R51 Headache: Secondary | ICD-10-CM

## 2015-01-01 DIAGNOSIS — M47812 Spondylosis without myelopathy or radiculopathy, cervical region: Secondary | ICD-10-CM | POA: Diagnosis not present

## 2015-01-01 DIAGNOSIS — M5011 Cervical disc disorder with radiculopathy,  high cervical region: Secondary | ICD-10-CM | POA: Diagnosis not present

## 2015-01-01 DIAGNOSIS — M47892 Other spondylosis, cervical region: Secondary | ICD-10-CM | POA: Insufficient documentation

## 2015-01-01 DIAGNOSIS — I6529 Occlusion and stenosis of unspecified carotid artery: Secondary | ICD-10-CM | POA: Diagnosis not present

## 2015-01-01 DIAGNOSIS — R519 Headache, unspecified: Secondary | ICD-10-CM

## 2015-01-01 DIAGNOSIS — M542 Cervicalgia: Secondary | ICD-10-CM | POA: Diagnosis not present

## 2015-01-01 DIAGNOSIS — M9901 Segmental and somatic dysfunction of cervical region: Secondary | ICD-10-CM | POA: Diagnosis not present

## 2015-01-02 DIAGNOSIS — M5011 Cervical disc disorder with radiculopathy,  high cervical region: Secondary | ICD-10-CM | POA: Diagnosis not present

## 2015-01-02 DIAGNOSIS — M9901 Segmental and somatic dysfunction of cervical region: Secondary | ICD-10-CM | POA: Diagnosis not present

## 2015-01-03 DIAGNOSIS — M9901 Segmental and somatic dysfunction of cervical region: Secondary | ICD-10-CM | POA: Diagnosis not present

## 2015-01-03 DIAGNOSIS — M5011 Cervical disc disorder with radiculopathy,  high cervical region: Secondary | ICD-10-CM | POA: Diagnosis not present

## 2015-01-16 ENCOUNTER — Encounter: Payer: Self-pay | Admitting: Neurology

## 2015-01-16 ENCOUNTER — Ambulatory Visit (INDEPENDENT_AMBULATORY_CARE_PROVIDER_SITE_OTHER): Payer: Medicare Other | Admitting: Neurology

## 2015-01-16 ENCOUNTER — Ambulatory Visit: Payer: Self-pay | Admitting: Neurology

## 2015-01-16 VITALS — BP 124/68 | HR 48 | Ht 61.0 in | Wt 157.0 lb

## 2015-01-16 DIAGNOSIS — G4485 Primary stabbing headache: Secondary | ICD-10-CM | POA: Diagnosis not present

## 2015-01-16 NOTE — Progress Notes (Signed)
NEUROLOGY FOLLOW UP OFFICE NOTE  Madison White [id number]  HISTORY OF PRESENT ILLNESS: Madison White is an 81 year old right-handed female with hypertension, hyperlipidemia, cerebrovascular disease, left bundle branch block, nonischemic cardiomyopathy, and history of rheumatic fever who follows up for unilateral headache.  Labs reviewed.  She is accompanied by her daughter who provides some history.  UPDATE: Sed Rate was 4.  She was titrated on gabapentin to 400mg  twice daily.  Headaches have remarkably improved.  They only occur once in a while, not daily.  However, she also got new glasses and had an adjustment by her chiropractor.  Therefore, it is really uncertain which treatment was most effective.  HISTORY: She began experiencing headaches in August.  She describes a severe stabbing pain in the right frontal and parietal region.  It is paroxysmal and occurs throughout the day.  Initially, it was once every two hours.  Now, it is every couple of minutes.  She also has a dull aching pain in the temple and back of the head on the right.  She also reports blurred vision only in the right eye, which comes and goes.  She also notices it if she had been reading.  There is no associated autonomic symptoms such as ptosis, pupil asymmetry or lacrimation.  There is no associated nausea or numbness and tingling.  She had not had any rash on the head.  She denies neck pain.  She has no prior history of headache.  She also has been experiencing dizzy spells.  She describes a spinning sensation lasting seconds and triggered with movement or change in position.  MRI of the brain with and without contrast was performed on 12/02/14, which showed generalized atrophy and old lacunes in the cerebral white matter.  Carotid doppler showed no hemodynamically significant ICA stenosis.  PAST MEDICAL HISTORY: Past Medical History  Diagnosis Date  . Insulin resistance     A1c's excellent per old records (6.1  in 2008 and 2009)  . HTN (hypertension)     hx of refusing treatment--White coat HTN (?)   . Hyperlipidemia     hx of refusing treatment  . GERD (gastroesophageal reflux disease)   . History of rheumatic fever   . History of blood transfusion   . Seasonal allergies   . Osteoarthritis   . Left bundle branch block 12/2011    Dr. Sallyanne Kuster at [facility]; ECHO and myocardial perfusion scan showed  septal and apical wall motion abnormality but she had no significant valvular disease and no ischemia.  She did have mildly decreased EF (39% on lexiscan and 50% on echo) and abnl LV relaxation.  Mild amount of PVCs.  Pt at higher risk for other conduction abnormalities, good chance of eventually requiring a pacemaker.   [name] LVH (left ventricular hypertrophy)   . Diastolic dysfunction, left ventricle   . Nonischemic cardiomyopathy ([place]) 2016    LV dysfunction due to LBBB/septal dyssynchrony  . Headache disorder 2016    R frontoparietal pain, episodic (paroxysmal hemicrania vs trig neuralgia of ophth br of CN V)--MRI brain 11/2014 showed age related changes but no explanation for her HA's.    MEDICATIONS: Current Outpatient Prescriptions on File Prior to Visit  Medication Sig Dispense Refill  . aspirin 81 MG tablet Take 1 tablet (81 mg total) by mouth daily. 30 tablet 1  . Calcium Carbonate-Vitamin D (CALCIUM 600 + D PO) Take 1 tablet by mouth daily.    . Cyanocobalamin (VITAMIN B-12 CR  PO) Take by mouth.    . fexofenadine (ALLEGRA) 180 MG tablet Take 180 mg by mouth daily.    . fish oil-omega-3 fatty acids 1000 MG capsule Take 2 g by mouth daily.    [name] gabapentin (NEURONTIN) 100 MG capsule Take 1cap BID x7d, then 2caps BID x7d, then 3caps BID x7d, then 4caps BID 240 capsule 0  . ibuprofen (ADVIL,MOTRIN) 200 MG tablet Take 200 mg by mouth 2 (two) times daily.    . Multiple Vitamins-Minerals (CENTRUM PO) Take by mouth.    [name] omeprazole (PRILOSEC) 20 MG capsule Take 1 capsule (20 mg total) by mouth daily as  needed. 30 capsule 3  . vitamin E 1000 UNIT capsule Take 1,000 Units by mouth daily.     No current facility-administered medications on file prior to visit.    ALLERGIES: Allergies  Allergen Reactions  . Avapro [Irbesartan] Other (See Comments)    Unknown rxn  . Lisinopril Swelling    Swelling around eyes; also says it caused increased sugar and BP  . Penicillins Hives  . Simvastatin Other (See Comments)    unknown  . Sulfa Antibiotics Rash    FAMILY HISTORY: Family History  Problem Relation Age of Onset  . Hypertension Mother   . Heart disease Mother   . Hyperlipidemia Mother   . Diabetes Mother   . Heart disease Father   . Cancer Father     SOCIAL HISTORY: Social History   Social History  . Marital Status: Widowed    Spouse Name: N/A  . Number of Children: N/A  . Years of Education: N/A   Occupational History  . Not on file.   Social History Main Topics  . Smoking status: Former Smoker    Quit date: 05/31/1958  . Smokeless tobacco: Never Used  . Alcohol Use: 0.0 oz/week    0 Standard drinks or equivalent per week  . Drug Use: No  . Sexual Activity: Not on file   Other Topics Concern  . Not on file   Social History Narrative   Widower, husband died around 29 (wrongful death per pt's report).   Originally from [place], has been in [place] since about 1997.   Lives alone.  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   Fairly active and independent.   Distant hx of tobacco abuse.  No alcohol or drugs.    REVIEW OF SYSTEMS: Constitutional: No fevers, chills, or sweats, no generalized fatigue, change in appetite Eyes: No visual changes, double vision, eye pain Ear, nose and throat: No hearing loss, ear pain, nasal congestion, sore throat Cardiovascular: No chest pain, palpitations Respiratory:  No shortness of breath at rest or with exertion, wheezes GastrointestinaI: No nausea, vomiting, diarrhea, abdominal pain, fecal  incontinence Genitourinary:  No dysuria, urinary retention or frequency Musculoskeletal:  No neck pain, back pain Integumentary: No rash, pruritus, skin lesions Neurological: as above Psychiatric: No depression, insomnia, anxiety Endocrine: No palpitations, fatigue, diaphoresis, mood swings, change in appetite, change in weight, increased thirst Hematologic/Lymphatic:  No anemia, purpura, petechiae. Allergic/Immunologic: no itchy/runny eyes, nasal congestion, recent allergic reactions, rashes  PHYSICAL EXAM: Filed Vitals:   01/16/15 0955  BP: 124/68  Pulse: 48   General: No acute distress.  Patient appears well-groomed.  normal body habitus. Head:  Normocephalic/atraumatic Eyes:  Fundoscopic exam unremarkable without vessel changes, exudates, hemorrhages or papilledema. Neck: supple, no paraspinal tenderness, full range of motion Heart:  Regular rate and rhythm Lungs:  Clear to auscultation  bilaterally Back: No paraspinal tenderness Neurological Exam: alert and oriented to person, place, and time. Attention span and concentration intact, recent and remote memory intact, fund of knowledge intact.  Speech fluent and not dysarthric, language intact.  CN II-XII intact. Fundoscopic exam unremarkable without vessel changes, exudates, hemorrhages or papilledema.  Bulk and tone normal, muscle strength 5/5 throughout.  Sensation to light touch intact.  Deep tendon reflexes 2+ throughout.  Finger to nose and heel to shin testing intact.  Gait normal  IMPRESSION: Primary stabbing headache  PLAN: 1.  She may no longer need gabapentin, so we will taper off of it.  If headaches should recur, she will call us and we can restart it. 2.  Follow up in 3 months.  Metta Clines, DO  CC:  Shawnie Dapper, MD

## 2015-01-16 NOTE — Patient Instructions (Signed)
1.  We will taper off of the gabapentin to see if it is not needed.  Take 3 pills twice daily for 7 days,  Then 2 pills twice daily for 7 days,  Then 1 pill twice daily for 7 days,  Then STOP  2.  Call if headaches recur 3.  Follow up in 3 months.

## 2015-01-20 ENCOUNTER — Encounter: Payer: Self-pay | Admitting: Family Medicine

## 2015-04-19 ENCOUNTER — Ambulatory Visit: Payer: Self-pay | Admitting: Neurology

## 2015-05-17 DIAGNOSIS — M9901 Segmental and somatic dysfunction of cervical region: Secondary | ICD-10-CM | POA: Diagnosis not present

## 2015-05-17 DIAGNOSIS — M5011 Cervical disc disorder with radiculopathy,  high cervical region: Secondary | ICD-10-CM | POA: Diagnosis not present

## 2015-06-21 DIAGNOSIS — M9901 Segmental and somatic dysfunction of cervical region: Secondary | ICD-10-CM | POA: Diagnosis not present

## 2015-06-21 DIAGNOSIS — M5011 Cervical disc disorder with radiculopathy,  high cervical region: Secondary | ICD-10-CM | POA: Diagnosis not present

## 2015-07-18 DIAGNOSIS — M5011 Cervical disc disorder with radiculopathy,  high cervical region: Secondary | ICD-10-CM | POA: Diagnosis not present

## 2015-07-18 DIAGNOSIS — M9901 Segmental and somatic dysfunction of cervical region: Secondary | ICD-10-CM | POA: Diagnosis not present

## 2015-08-27 DIAGNOSIS — M9901 Segmental and somatic dysfunction of cervical region: Secondary | ICD-10-CM | POA: Diagnosis not present

## 2015-08-27 DIAGNOSIS — M5011 Cervical disc disorder with radiculopathy,  high cervical region: Secondary | ICD-10-CM | POA: Diagnosis not present

## 2015-09-25 DIAGNOSIS — M9901 Segmental and somatic dysfunction of cervical region: Secondary | ICD-10-CM | POA: Diagnosis not present

## 2015-09-25 DIAGNOSIS — M5011 Cervical disc disorder with radiculopathy,  high cervical region: Secondary | ICD-10-CM | POA: Diagnosis not present

## 2015-10-04 DIAGNOSIS — M9903 Segmental and somatic dysfunction of lumbar region: Secondary | ICD-10-CM | POA: Diagnosis not present

## 2015-10-04 DIAGNOSIS — M170 Bilateral primary osteoarthritis of knee: Secondary | ICD-10-CM | POA: Diagnosis not present

## 2015-10-04 DIAGNOSIS — M5136 Other intervertebral disc degeneration, lumbar region: Secondary | ICD-10-CM | POA: Diagnosis not present

## 2015-10-04 DIAGNOSIS — M25562 Pain in left knee: Secondary | ICD-10-CM | POA: Diagnosis not present

## 2015-10-04 DIAGNOSIS — M9906 Segmental and somatic dysfunction of lower extremity: Secondary | ICD-10-CM | POA: Diagnosis not present

## 2015-10-09 DIAGNOSIS — M9906 Segmental and somatic dysfunction of lower extremity: Secondary | ICD-10-CM | POA: Diagnosis not present

## 2015-10-09 DIAGNOSIS — M170 Bilateral primary osteoarthritis of knee: Secondary | ICD-10-CM | POA: Diagnosis not present

## 2015-10-09 DIAGNOSIS — M9903 Segmental and somatic dysfunction of lumbar region: Secondary | ICD-10-CM | POA: Diagnosis not present

## 2015-10-09 DIAGNOSIS — M25562 Pain in left knee: Secondary | ICD-10-CM | POA: Diagnosis not present

## 2015-10-09 DIAGNOSIS — M5136 Other intervertebral disc degeneration, lumbar region: Secondary | ICD-10-CM | POA: Diagnosis not present

## 2015-10-11 DIAGNOSIS — M9903 Segmental and somatic dysfunction of lumbar region: Secondary | ICD-10-CM | POA: Diagnosis not present

## 2015-10-11 DIAGNOSIS — M170 Bilateral primary osteoarthritis of knee: Secondary | ICD-10-CM | POA: Diagnosis not present

## 2015-10-11 DIAGNOSIS — M25562 Pain in left knee: Secondary | ICD-10-CM | POA: Diagnosis not present

## 2015-10-11 DIAGNOSIS — M9906 Segmental and somatic dysfunction of lower extremity: Secondary | ICD-10-CM | POA: Diagnosis not present

## 2015-10-11 DIAGNOSIS — M5136 Other intervertebral disc degeneration, lumbar region: Secondary | ICD-10-CM | POA: Diagnosis not present

## 2015-10-16 DIAGNOSIS — M170 Bilateral primary osteoarthritis of knee: Secondary | ICD-10-CM | POA: Diagnosis not present

## 2015-10-16 DIAGNOSIS — M5136 Other intervertebral disc degeneration, lumbar region: Secondary | ICD-10-CM | POA: Diagnosis not present

## 2015-10-16 DIAGNOSIS — M25562 Pain in left knee: Secondary | ICD-10-CM | POA: Diagnosis not present

## 2015-10-16 DIAGNOSIS — M9906 Segmental and somatic dysfunction of lower extremity: Secondary | ICD-10-CM | POA: Diagnosis not present

## 2015-10-16 DIAGNOSIS — M9903 Segmental and somatic dysfunction of lumbar region: Secondary | ICD-10-CM | POA: Diagnosis not present

## 2015-10-18 DIAGNOSIS — M170 Bilateral primary osteoarthritis of knee: Secondary | ICD-10-CM | POA: Diagnosis not present

## 2015-10-18 DIAGNOSIS — M9906 Segmental and somatic dysfunction of lower extremity: Secondary | ICD-10-CM | POA: Diagnosis not present

## 2015-10-18 DIAGNOSIS — M9903 Segmental and somatic dysfunction of lumbar region: Secondary | ICD-10-CM | POA: Diagnosis not present

## 2015-10-18 DIAGNOSIS — M5136 Other intervertebral disc degeneration, lumbar region: Secondary | ICD-10-CM | POA: Diagnosis not present

## 2015-10-18 DIAGNOSIS — M25562 Pain in left knee: Secondary | ICD-10-CM | POA: Diagnosis not present

## 2015-10-19 DIAGNOSIS — M9906 Segmental and somatic dysfunction of lower extremity: Secondary | ICD-10-CM | POA: Diagnosis not present

## 2015-10-19 DIAGNOSIS — M5136 Other intervertebral disc degeneration, lumbar region: Secondary | ICD-10-CM | POA: Diagnosis not present

## 2015-10-19 DIAGNOSIS — M9903 Segmental and somatic dysfunction of lumbar region: Secondary | ICD-10-CM | POA: Diagnosis not present

## 2015-10-19 DIAGNOSIS — M170 Bilateral primary osteoarthritis of knee: Secondary | ICD-10-CM | POA: Diagnosis not present

## 2015-10-19 DIAGNOSIS — M25562 Pain in left knee: Secondary | ICD-10-CM | POA: Diagnosis not present

## 2015-10-22 DIAGNOSIS — M5136 Other intervertebral disc degeneration, lumbar region: Secondary | ICD-10-CM | POA: Diagnosis not present

## 2015-10-22 DIAGNOSIS — M9906 Segmental and somatic dysfunction of lower extremity: Secondary | ICD-10-CM | POA: Diagnosis not present

## 2015-10-22 DIAGNOSIS — M9903 Segmental and somatic dysfunction of lumbar region: Secondary | ICD-10-CM | POA: Diagnosis not present

## 2015-10-22 DIAGNOSIS — M25562 Pain in left knee: Secondary | ICD-10-CM | POA: Diagnosis not present

## 2015-10-22 DIAGNOSIS — M170 Bilateral primary osteoarthritis of knee: Secondary | ICD-10-CM | POA: Diagnosis not present

## 2015-10-23 DIAGNOSIS — M25561 Pain in right knee: Secondary | ICD-10-CM | POA: Diagnosis not present

## 2015-10-23 DIAGNOSIS — M25562 Pain in left knee: Secondary | ICD-10-CM | POA: Diagnosis not present

## 2015-10-24 DIAGNOSIS — M5011 Cervical disc disorder with radiculopathy,  high cervical region: Secondary | ICD-10-CM | POA: Diagnosis not present

## 2015-10-24 DIAGNOSIS — M9901 Segmental and somatic dysfunction of cervical region: Secondary | ICD-10-CM | POA: Diagnosis not present

## 2015-10-25 ENCOUNTER — Encounter: Payer: Self-pay | Admitting: Family Medicine

## 2015-11-12 ENCOUNTER — Encounter: Payer: Self-pay | Admitting: Cardiovascular Disease

## 2015-11-12 ENCOUNTER — Ambulatory Visit (INDEPENDENT_AMBULATORY_CARE_PROVIDER_SITE_OTHER): Payer: Medicare Other | Admitting: Cardiovascular Disease

## 2015-11-12 VITALS — BP 140/70 | HR 79 | Resp 93 | Ht 61.0 in | Wt 153.0 lb

## 2015-11-12 DIAGNOSIS — I429 Cardiomyopathy, unspecified: Secondary | ICD-10-CM | POA: Diagnosis not present

## 2015-11-12 DIAGNOSIS — E785 Hyperlipidemia, unspecified: Secondary | ICD-10-CM

## 2015-11-12 DIAGNOSIS — I10 Essential (primary) hypertension: Secondary | ICD-10-CM | POA: Diagnosis not present

## 2015-11-12 DIAGNOSIS — I447 Left bundle-branch block, unspecified: Secondary | ICD-10-CM | POA: Diagnosis not present

## 2015-11-12 DIAGNOSIS — I428 Other cardiomyopathies: Secondary | ICD-10-CM

## 2015-11-12 NOTE — Progress Notes (Signed)
Cardiology Office Note    Date:  11/12/2015   ID:  Madison White, DOB 08-14-1928, MRN [id number]  PCP:  Madison Sou, MD  Cardiologist:   Sanda Klein, MD   Chief Complaint  Patient presents with  . Follow-up    pt c/o cramping in legs    History of Present Illness:  Madison White is a 82 y.o. female with mild nonischemic cardiomyopathy, left bundle branch block, hypertension (possibly "white-coat") and mild hyperlipidemia returning for routine follow-up. Previously evaluated left ventricular ejection fraction is approximately 50% (by echo 2013, nuclear scintigraphy showed EF of 39%). She did not have evidence of perfusion abnormalities on nuclear imaging and does not have angina pectoris. Carotid duplex performed September 2016 did not show significant plaque.   Had problems with severe headaches (paroxysmal hemicrania versus trigeminal neuralgia) improved on gabapentin, now virtually resolved. Denies exertional angina or dyspnea, palpitations, syncope, leg edema, claudication, new focal neurological complaints, expected weight changes, fever/chills, cough/hemoptysis, change in bowel pattern, bleeding problems.  Most prominent complaint is bilateral knee arthralgia, improved on the right side after her shot, still an issue on the left side. She is considering total knee replacement surgery and plans to see Dr. Wynelle Link if this is necessary.  Past Medical History:  Diagnosis Date  . Diastolic dysfunction, left ventricle   . GERD (gastroesophageal reflux disease)   . 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  [name] History of blood transfusion   . History of rheumatic fever   . HTN (hypertension)    hx of refusing treatment--White coat HTN (?)   .  Hyperlipidemia    hx of refusing treatment  . Insulin resistance    A1c's excellent per old records (6.1 in 2008 and 2009)  . Left bundle branch block 12/2011   Dr. Sallyanne Kuster at [facility]; ECHO and myocardial perfusion scan showed  septal and apical wall motion abnormality but she had no significant valvular disease and no ischemia.  She did have mildly decreased EF (39% on lexiscan and 50% on echo) and abnl LV relaxation.  Mild amount of PVCs.  Pt at higher risk for other conduction abnormalities, good chance of eventually requiring a pacemaker.   [name] LVH (left ventricular hypertrophy)   . Nonischemic cardiomyopathy ([place]) 2016   LV dysfunction due to LBBB/septal dyssynchrony  . Osteoarthritis    bilat knees--needs bilat TKA.  Ortho is trying steroid injections as of 10/23/15.  . Seasonal allergies     Past Surgical History:  Procedure Laterality Date  . ABDOMINAL HYSTERECTOMY  1963   At the time of her last C/S; says she had partial bladder resection at that time as well  . CARDIOVASCULAR STRESS TEST  08/2005 & 12/2011   Low risk scans (on the 12/2011 scan she did have EF 39% with moderately severe LV dysfunction with septal dyssynergy probably contributed by LBBB  . Carotid dopplers  11/22/14   NORMAL  . CATARACT EXTRACTION  08/14/11   both  . CESAREAN SECTION  X 5   One of her neonates died soon after birth  . STRABISMUS SURGERY  1939  . TONSILLECTOMY  1938  . TRANSTHORACIC ECHOCARDIOGRAM  01/13/12   Septal dyssynergy, EF 50%, LV relaxation impaired.  No significant valvular abnormalities.  Current Medications: Outpatient Medications Prior to Visit  Medication Sig Dispense Refill  . aspirin 81 MG tablet Take 1 tablet (81 mg total) by mouth daily. 30 tablet 1  . Calcium Carbonate-Vitamin D (CALCIUM 600 + D PO) Take 1 tablet by mouth daily.    . Cyanocobalamin (VITAMIN B-12 CR PO) Take by mouth.    . fexofenadine (ALLEGRA) 180 MG tablet Take 180 mg by mouth daily.    . fish oil-omega-3  fatty acids 1000 MG capsule Take 2 g by mouth daily.    [name] ibuprofen (ADVIL,MOTRIN) 200 MG tablet Take 200 mg by mouth 2 (two) times daily.    . Multiple Vitamins-Minerals (CENTRUM PO) Take by mouth.    [name] omeprazole (PRILOSEC) 20 MG capsule Take 1 capsule (20 mg total) by mouth daily as needed. 30 capsule 3  . vitamin E 1000 UNIT capsule Take 1,000 Units by mouth daily.    [name] gabapentin (NEURONTIN) 100 MG capsule Take 1cap BID x7d, then 2caps BID x7d, then 3caps BID x7d, then 4caps BID (Patient not taking: Reported on 11/12/2015) 240 capsule 0   No facility-administered medications prior to visit.      Allergies:   Avapro [irbesartan]; Lisinopril; Penicillins; Simvastatin; and Sulfa antibiotics   Social History   Social History  . Marital status: Widowed    Spouse name: N/A  . Number of children: N/A  . Years of education: N/A   Social History Main Topics  . Smoking status: Former Smoker    Quit date: 05/31/1958  . Smokeless tobacco: Never Used  . Alcohol use 0.0 oz/week  . Drug use: No  . Sexual activity: Not on file   Other Topics Concern  . Not on file   Social History Narrative   Widower, husband died around 67 (wrongful death per pt's report).   Originally from [place], has been in [place] since about 1997.   Lives alone.  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   Fairly active and independent.   Distant hx of tobacco abuse.  No alcohol or drugs.     Family History:  The patient's family history includes Cancer in her father; Diabetes in her mother; Heart disease in her father and mother; Hyperlipidemia in her mother; Hypertension in her mother.   ROS:   Please see the history of present illness.    ROS All other systems reviewed and are negative.   PHYSICAL EXAM:   VS:  BP 140/70 (BP Location: Right Arm, Patient Position: Sitting, Cuff Size: Normal)   Pulse 79   Resp (!) 93   Ht 5\' 1"  (1.549 m)   Wt 153 lb (69.4 kg)   BMI 28.91  kg/m    GEN: Well nourished, well developed, in no acute distress  HEENT: normal  Neck: no JVD, carotid bruits, or masses Cardiac: Paradoxically split second heart sound, RRR; no murmurs, rubs, or gallops,no edema  Respiratory:  clear to auscultation bilaterally, normal work of breathing GI: soft, nontender, nondistended, + BS MS: no deformity or atrophy  Skin: warm and dry, no rash Neuro:  Alert and Oriented x 3, Strength and sensation are intact Psych: euthymic mood, full affect  Wt Readings from Last 3 Encounters:  11/12/15 153 lb (69.4 kg)  01/16/15 157 lb (71.2 kg)  12/28/14 153 lb (69.4 kg)      Studies/Labs Reviewed:   EKG:  EKG is ordered today.  The ekg ordered today demonstrates Normal sinus rhythm with first-degree AV  block (PR 290 ms), left bundle branch block (QRS 150 ms), left axis deviation, QTC 509 ms  Recent Labs: 11/21/2014: BUN 13; Creatinine, Ser 0.86; Potassium 4.9; Sodium 143   Lipid Panel    Component Value Date/Time   CHOL 208 (H) 05/29/2011 0810   TRIG 93.0 05/29/2011 0810   HDL 70.40 05/29/2011 0810   CHOLHDL 3 05/29/2011 0810   VLDL 18.6 05/29/2011 0810   LDLDIRECT 117.9 05/29/2011 0810    Additional studies/ records that were reviewed today include:  Notes from family medicine and neurology    ASSESSMENT:    1. Nonischemic cardiomyopathy ([place])   2. Left bundle branch block   3. HTN (hypertension), benign   4. Dyslipidemia      PLAN:  In order of problems listed above:  1. CMP: No clinical evidence of active heart failure. Not requiring diuretics to maintain euvolemic. Reduced left ventricular ejection fraction is at least in part related to left bundle branch block-induced dyssynchrony. 2. LBBB: No symptoms of high-grade AV block 3. HTN: Questionable diagnosis, probably has situational hypertension. No need for additional therapy. 4. Has not had a recent lipid profile, but also does not have known vascular disease. Concentrate on  diet and exercise, no plan for additional pharmacological therapy.  If she is to require knee replacement surgery, I think she is at low risk for major cardiovascular complications and I don't think that further evaluation from a cardiac point of view would be necessary.  Medication Adjustments/Labs and Tests Ordered: Current medicines are reviewed at length with the patient today.  Concerns regarding medicines are outlined above.  Medication changes, Labs and Tests ordered today are listed in the Patient Instructions below. Patient Instructions  Dr Sallyanne Kuster recommends that you schedule a follow-up appointment in 1 year. You will receive a reminder letter in the mail two months in advance. If you don't receive a letter, please call our office to schedule the follow-up appointment.  If you need a refill on your cardiac medications before your next appointment, please call your pharmacy.    Signed, Sanda Klein, MD  11/12/2015 2:09 PM    [facility] [address], [place], [place]  [address] Phone: [phone]; Fax: [phone]

## 2015-11-12 NOTE — Patient Instructions (Signed)
Dr Croitoru recommends that you schedule a follow-up appointment in 1 year. You will receive a reminder letter in the mail two months in advance. If you don't receive a letter, please call our office to schedule the follow-up appointment.  If you need a refill on your cardiac medications before your next appointment, please call your pharmacy. 

## 2015-11-13 ENCOUNTER — Encounter: Payer: Self-pay | Admitting: Family Medicine

## 2015-11-16 ENCOUNTER — Ambulatory Visit: Payer: Self-pay | Admitting: Cardiovascular Disease

## 2015-11-26 DIAGNOSIS — M9901 Segmental and somatic dysfunction of cervical region: Secondary | ICD-10-CM | POA: Diagnosis not present

## 2015-11-26 DIAGNOSIS — M5011 Cervical disc disorder with radiculopathy,  high cervical region: Secondary | ICD-10-CM | POA: Diagnosis not present

## 2015-11-29 ENCOUNTER — Telehealth: Payer: Self-pay

## 2015-11-29 NOTE — Telephone Encounter (Signed)
LM requesting return call to schedule AWV.

## 2015-12-26 DIAGNOSIS — L918 Other hypertrophic disorders of the skin: Secondary | ICD-10-CM | POA: Diagnosis not present

## 2015-12-26 DIAGNOSIS — L821 Other seborrheic keratosis: Secondary | ICD-10-CM | POA: Diagnosis not present

## 2015-12-26 DIAGNOSIS — M5011 Cervical disc disorder with radiculopathy,  high cervical region: Secondary | ICD-10-CM | POA: Diagnosis not present

## 2015-12-26 DIAGNOSIS — B079 Viral wart, unspecified: Secondary | ICD-10-CM | POA: Diagnosis not present

## 2015-12-26 DIAGNOSIS — M9901 Segmental and somatic dysfunction of cervical region: Secondary | ICD-10-CM | POA: Diagnosis not present

## 2015-12-26 DIAGNOSIS — Z23 Encounter for immunization: Secondary | ICD-10-CM | POA: Diagnosis not present

## 2015-12-26 DIAGNOSIS — I781 Nevus, non-neoplastic: Secondary | ICD-10-CM | POA: Diagnosis not present

## 2016-01-04 NOTE — Progress Notes (Signed)
Subjective:   Madison White is a 82 y.o. female who presents for an Initial Medicare Annual Wellness Visit accompanied by her grand-daughter, Cyril Mourning.   The Patient was informed that the wellness visit is to identify future health risk and educate and initiate measures that can reduce risk for increased disease through the lifespan.   Describes health as fair, good or great? "good"  Review of Systems    No ROS.  Medicare Wellness Visit.   Cardiac Risk Factors include: advanced age (>76men, >47 women);dyslipidemia;family history of premature cardiovascular disease   Sleep patterns: Patient has no difficulty with sleeping.   Home Safety/Smoke Alarms: Smoke detectors in place.  Living environment; residence and Firearm Safety: Daughter lives with patient, moving in January to [place] where pt will be on ground floor only. Currently has 8 steps to get in house. Has 1 dog, walks to mailbox. No firearms.  Seat Belt Safety/Bike Helmet: Wears seatbelt.   Counseling:   Eye Exam-Last exam last year. Will make eye appt.-Dr. Jacinta Shoe exam over 2 years, will make appt.-Dr. Nolon Lennert  Female:   Pap-N/A       Mammo-Never. Order placed.  Dexa scan-Last 2016. Recall as needed per daughter. Will obtain records.  CCS-would like cologuard test, will order.       Objective:    Today's Vitals   01/07/16 1007  BP: 138/78  Pulse: 72  SpO2: 98%  Weight: 151 lb 12.8 oz (68.9 kg)  Height: 5\' 1"  (1.549 m)   Body mass index is 28.68 kg/m.   Current Medications (verified) Outpatient Encounter Prescriptions as of 01/07/2016  Medication Sig  . aspirin 81 MG tablet Take 1 tablet (81 mg total) by mouth daily.  . Calcium Carbonate-Vitamin D (CALCIUM 600 + D PO) Take 1 tablet by mouth daily.  . Cyanocobalamin (VITAMIN B-12 CR PO) Take by mouth.  . fexofenadine (ALLEGRA) 180 MG tablet Take 180 mg by mouth daily.  . Multiple Vitamins-Minerals (CENTRUM PO) Take by mouth.  [name] omeprazole  (PRILOSEC) 20 MG capsule Take 1 capsule (20 mg total) by mouth daily as needed.  . vitamin E 1000 UNIT capsule Take 1,000 Units by mouth daily.  . fish oil-omega-3 fatty acids 1000 MG capsule Take 2 g by mouth daily.  [name] ibuprofen (ADVIL,MOTRIN) 200 MG tablet Take 200 mg by mouth 2 (two) times daily.   No facility-administered encounter medications on file as of 01/07/2016.     Allergies (verified) Avapro [irbesartan]; Lisinopril; Penicillins; Simvastatin; and Sulfa antibiotics   History: Past Medical History:  Diagnosis Date  . Diastolic dysfunction, left ventricle   . GERD (gastroesophageal reflux disease)   . 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  [name] History of blood transfusion   . History of rheumatic fever   . HTN (hypertension)    hx of refusing treatment--White coat HTN and/or situational HTN (?)   . Hyperlipidemia    hx of refusing treatment  . Insulin resistance    A1c's excellent per old records (6.1 in 2008 and 2009)  . Left bundle branch block 12/2011   Dr. Sallyanne Kuster at [facility]; ECHO and myocardial perfusion scan showed  septal and apical wall motion abnormality but she had no significant valvular disease and no ischemia.  She did  have mildly decreased EF (39% on lexiscan and 50% on echo) and abnl LV relaxation.  Mild amount of PVCs.  Pt at higher risk for other conduction abnormalities, good chance of eventually requiring a pacemaker.   [name] LVH (left ventricular hypertrophy)   . Nonischemic cardiomyopathy ([place]) 2016   LV dysfunction due to LBBB/septal dyssynchrony  . Osteoarthritis    bilat knees--needs bilat TKA.  Ortho is trying steroid injections as of 10/23/15.  . Seasonal allergies    Past Surgical History:  Procedure Laterality Date  .  ABDOMINAL HYSTERECTOMY  1963   At the time of her last C/S; says she had partial bladder resection at that time as well  . CARDIOVASCULAR STRESS TEST  08/2005 & 12/2011   Low risk scans (on the 12/2011 scan she did have EF 39% with moderately severe LV dysfunction with septal dyssynergy probably contributed by LBBB  . Carotid dopplers  11/22/14   NORMAL  . CATARACT EXTRACTION  08/14/11   both  . CESAREAN SECTION  X 5   One of her neonates died soon after birth  . CHOLECYSTECTOMY     1980's  . STRABISMUS SURGERY  1939  . TONSILLECTOMY  1938  . TRANSTHORACIC ECHOCARDIOGRAM  01/13/12   Septal dyssynergy, EF 50%, LV relaxation impaired.  No significant valvular abnormalities.   Family History  Problem Relation Age of Onset  . Hypertension Mother   . Heart disease Mother   . Hyperlipidemia Mother   . Diabetes Mother   . Heart disease Father   . Cancer Father    Social History   Occupational History  . Not on file.   Social History Main Topics  . Smoking status: Former Smoker    Quit date: 05/31/1958  . Smokeless tobacco: Never Used  . Alcohol use 0.6 oz/week    1 Glasses of wine per week  . Drug use: No  . Sexual activity: Not on file    Tobacco Counseling Counseling given: Not Answered   Activities of Daily Living In your present state of health, do you have any difficulty performing the following activities: 01/07/2016  Hearing? N  Vision? N  Difficulty concentrating or making decisions? Y  Walking or climbing stairs? Y  Dressing or bathing? N  Doing errands, shopping? N  Preparing Food and eating ? Y  Using the Toilet? N  In the past six months, have you accidently leaked urine? N  Do you have problems with loss of bowel control? N  Managing your Medications? N  Managing your Finances? Y  Housekeeping or managing your Housekeeping? N  Some recent data might be hidden    Immunizations and Health Maintenance Immunization History  Administered Date(s)  Administered  . Influenza, High Dose Seasonal PF 12/28/2014  . Influenza,inj,Quad PF,36+ Mos 02/20/2014  . Influenza,inj,Quad PF,6-35 Mos 11/19/2012  . Influenza-Unspecified 12/26/2015  . Pneumococcal Conjugate-13 12/28/2014  . Tdap 09/08/2011   Health Maintenance Due  Topic Date Due  . ZOSTAVAX  12/11/1990  . DEXA SCAN  12/11/1995  . INFLUENZA VACCINE  10/16/2015  . PNA vac Low Risk Adult (2 of 2 - PPSV23) 12/28/2015   Patient states she received Flu Vaccine on 12/26/15 at dermatology appointment.  Patient declined PPSV23 today.  Patient will consider Zoster Vaccine.    Patient Care Team: Tammi Sou, MD as PCP - General (Family Medicine) Sanda Klein, MD as Consulting Physician (Cardiology) Pieter Partridge, DO as Consulting Physician (Neurology) Ninetta Lights, MD as Consulting Physician (  Orthopedic Surgery) Gaynelle Arabian, MD as Consulting Physician (Orthopedic Surgery) Crista Luria, MD as Consulting Physician (Dermatology) [facility]) Nolon Lennert (Dentistry)  Indicate any recent Medical Services you may have received from other than [facility] providers in the past year (date may be approximate).     Assessment:     This is a routine wellness examination for Kwana.  Physical assessment deferred to PCP.   Hearing/Vision screen Hearing Screening Comments: No difficulty with conversational tones.  Vision Screening Comments: Last exam last year, making appointment for follow up. Wears reading glasses.   Dietary issues and exercise activities discussed: Current Exercise Habits: The patient does not participate in regular exercise at present (occasional stretching. Walks to [employer] daily. ), Exercise limited by: cardiac condition(s);orthopedic condition(s)   Diet (meal preparation, eat out, water intake, caffeinated beverages, dairy products, fruits and vegetables): Daughter prepares meals. Drinks juices, 3 cups of coffee (caffeinated) daily. Drinks water with pills.  Encouraged to increase water intake.   Breakfast: Cereal, coffee, juice Lunch: fruit, crackers Dinner: protein, starch and vegetable.   Goals      Patient Stated   . <enter goal here> (pt-stated)          "get back in to motion after knee surgery" (in January). Anticipates getting back [employer] for water exercise.       Depression Screen PHQ 2/9 Scores 01/07/2016  PHQ - 2 Score 0    Fall Risk Fall Risk  01/07/2016 01/16/2015  Falls in the past year? No No    Cognitive Function: MMSE - Mini Mental State Exam 01/07/2016  Orientation to time 4  Orientation to Place 5  Registration 3  Attention/ Calculation 5  Recall 3  Language- name 2 objects 2  Language- repeat 1  Language- follow 3 step command 3  Language- read & follow direction 1  Write a sentence 1  Copy design 1  Total score 29        Screening Tests Health Maintenance  Topic Date Due  . ZOSTAVAX  12/11/1990  . DEXA SCAN  12/11/1995  . INFLUENZA VACCINE  10/16/2015  . PNA vac Low Risk Adult (2 of 2 - PPSV23) 12/28/2015  . TETANUS/TDAP  09/07/2021      Plan:     Eat heart healthy diet (full of fruits, vegetables, whole grains, lean protein, water--limit salt, fat, and sugar intake) and increase physical activity as tolerated.  Continue doing brain stimulating activities (puzzles, reading, adult coloring books, staying active) to keep memory sharp.   Bring a copy of your advanced directives to your next office visit.   Concerns: -protein in urine at home nurse visit 2 weeks ago -decreased appetite -diarrhea x 1 month Patient would like result of cologuard test prior to any other treatment.   Family member states pt has been diagnosed with Paranoid disorder, which pt will not acknowledge. This was discussed in the absence of the patient.  Labs drawn: Fasting lipid, microalbumin urine.    During the course of the visit, Christyana was educated and counseled about the following appropriate screening and  preventive services:   Vaccines to include Pneumoccal, Influenza, Hepatitis B, Td, Zostavax, HCV  Cardiovascular disease screening  Colorectal cancer screening  Bone density screening  Diabetes screening  Glaucoma screening  Mammography/PAP  Nutrition counseling   Patient Instructions (the written plan) were given to the patient.    Gerilyn Nestle, RN   01/07/2016

## 2016-01-04 NOTE — Progress Notes (Signed)
Pre visit review using our clinic review tool, if applicable. No additional management support is needed unless otherwise documented below in the visit note. 

## 2016-01-07 ENCOUNTER — Other Ambulatory Visit: Payer: Self-pay

## 2016-01-07 ENCOUNTER — Ambulatory Visit (INDEPENDENT_AMBULATORY_CARE_PROVIDER_SITE_OTHER): Payer: Medicare Other

## 2016-01-07 VITALS — BP 138/78 | HR 72 | Ht 61.0 in | Wt 151.8 lb

## 2016-01-07 DIAGNOSIS — Z1239 Encounter for other screening for malignant neoplasm of breast: Secondary | ICD-10-CM

## 2016-01-07 DIAGNOSIS — Z1231 Encounter for screening mammogram for malignant neoplasm of breast: Secondary | ICD-10-CM

## 2016-01-07 DIAGNOSIS — E785 Hyperlipidemia, unspecified: Secondary | ICD-10-CM

## 2016-01-07 DIAGNOSIS — E8881 Metabolic syndrome: Secondary | ICD-10-CM

## 2016-01-07 DIAGNOSIS — Z0000 Encounter for general adult medical examination without abnormal findings: Secondary | ICD-10-CM

## 2016-01-07 LAB — LIPID PANEL
CHOL/HDL RATIO: 3
Cholesterol: 194 mg/dL (ref 0–200)
HDL: 58.4 mg/dL (ref >39.00)
LDL Cholesterol: 111 mg/dL — ABNORMAL HIGH (ref 0–99)
NonHDL: 135.3
TRIGLYCERIDES: 121 mg/dL (ref 0.0–149.0)
VLDL: 24.2 mg/dL (ref 0.0–40.0)

## 2016-01-07 LAB — MICROALBUMIN / CREATININE URINE RATIO
Creatinine,U: 302.6 mg/dL
MICROALB/CREAT RATIO: 1.7 mg/g (ref 0.0–30.0)
Microalb, Ur: 5.2 mg/dL — ABNORMAL HIGH (ref 0.0–1.9)

## 2016-01-07 NOTE — Progress Notes (Signed)
Reviewed the Medicare Wellness encounter done today by Roderic Ovens, RN, and I agree with her findings and plans.  Signed:  Crissie Sickles, MD           01/07/2016

## 2016-01-07 NOTE — Patient Instructions (Addendum)
Eat heart healthy diet (full of fruits, vegetables, whole grains, lean protein, water--limit salt, fat, and sugar intake) and increase physical activity as tolerated.  Continue doing brain stimulating activities (puzzles, reading, adult coloring books, staying active) to keep memory sharp.   Bring a copy of your advanced directives to your next office visit.  Fall Prevention in the Home  Falls can cause injuries. They can happen to people of all ages. There are many things you can do to make your home safe and to help prevent falls.  WHAT CAN I DO ON THE OUTSIDE OF MY HOME?  Regularly fix the edges of walkways and driveways and fix any cracks.  Remove anything that might make you trip as you walk through a door, such as a raised step or threshold.  Trim any bushes or trees on the path to your home.  Use bright outdoor lighting.  Clear any walking paths of anything that might make someone trip, such as rocks or tools.  Regularly check to see if handrails are loose or broken. Make sure that both sides of any steps have handrails.  Any raised decks and porches should have guardrails on the edges.  Have any leaves, snow, or ice cleared regularly.  Use sand or salt on walking paths during winter.  Clean up any spills in your garage right away. This includes oil or grease spills. WHAT CAN I DO IN THE BATHROOM?   Use night lights.  Install grab bars by the toilet and in the tub and shower. Do not use towel bars as grab bars.  Use non-skid mats or decals in the tub or shower.  If you need to sit down in the shower, use a plastic, non-slip stool.  Keep the floor dry. Clean up any water that spills on the floor as soon as it happens.  Remove soap buildup in the tub or shower regularly.  Attach bath mats securely with double-sided non-slip rug tape.  Do not have throw rugs and other things on the floor that can make you trip. WHAT CAN I DO IN THE BEDROOM?  Use night  lights.  Make sure that you have a light by your bed that is easy to reach.  Do not use any sheets or blankets that are too big for your bed. They should not hang down onto the floor.  Have a firm chair that has side arms. You can use this for support while you get dressed.  Do not have throw rugs and other things on the floor that can make you trip. WHAT CAN I DO IN THE KITCHEN?  Clean up any spills right away.  Avoid walking on wet floors.  Keep items that you use a lot in easy-to-reach places.  If you need to reach something above you, use a strong step stool that has a grab bar.  Keep electrical cords out of the way.  Do not use floor polish or wax that makes floors slippery. If you must use wax, use non-skid floor wax.  Do not have throw rugs and other things on the floor that can make you trip. WHAT CAN I DO WITH MY STAIRS?  Do not leave any items on the stairs.  Make sure that there are handrails on both sides of the stairs and use them. Fix handrails that are broken or loose. Make sure that handrails are as long as the stairways.  Check any carpeting to make sure that it is firmly attached to the stairs.  Fix any carpet that is loose or worn.  Avoid having throw rugs at the top or bottom of the stairs. If you do have throw rugs, attach them to the floor with carpet tape.  Make sure that you have a light switch at the top of the stairs and the bottom of the stairs. If you do not have them, ask someone to add them for you. WHAT ELSE CAN I DO TO HELP PREVENT FALLS?  Wear shoes that:  Do not have high heels.  Have rubber bottoms.  Are comfortable and fit you well.  Are closed at the toe. Do not wear sandals.  If you use a stepladder:  Make sure that it is fully opened. Do not climb a closed stepladder.  Make sure that both sides of the stepladder are locked into place.  Ask someone to hold it for you, if possible.  Clearly mark and make sure that you can  see:  Any grab bars or handrails.  First and last steps.  Where the edge of each step is.  Use tools that help you move around (mobility aids) if they are needed. These include:  Canes.  Walkers.  Scooters.  Crutches.  Turn on the lights when you go into a dark area. Replace any light bulbs as soon as they burn out.  Set up your furniture so you have a clear path. Avoid moving your furniture around.  If any of your floors are uneven, fix them.  If there are any pets around you, be aware of where they are.  Review your medicines with your doctor. Some medicines can make you feel dizzy. This can increase your chance of falling. Ask your doctor what other things that you can do to help prevent falls.   This information is not intended to replace advice given to you by your health care provider. Make sure you discuss any questions you have with your health care provider.   Document Released: 12/28/2008 Document Revised: 07/18/2014 Document Reviewed: 04/07/2014 Elsevier Interactive Patient Education 2016 [place] Maintenance, Female Adopting a healthy lifestyle and getting preventive care can go a long way to promote health and wellness. Talk with your health care provider about what schedule of regular examinations is right for you. This is a good chance for you to check in with your provider about disease prevention and staying healthy. In between checkups, there are plenty of things you can do on your own. Experts have done a lot of research about which lifestyle changes and preventive measures are most likely to keep you healthy. Ask your health care provider for more information. WEIGHT AND DIET  Eat a healthy diet  Be sure to include plenty of vegetables, fruits, low-fat dairy products, and lean protein.  Do not eat a lot of foods high in solid fats, added sugars, or salt.  Get regular exercise. This is one of the most important things you can do for your  health.  Most adults should exercise for at least 150 minutes each week. The exercise should increase your heart rate and make you sweat (moderate-intensity exercise).  Most adults should also do strengthening exercises at least twice a week. This is in addition to the moderate-intensity exercise.  Maintain a healthy weight  Body mass index (BMI) is a measurement that can be used to identify possible weight problems. It estimates body fat based on height and weight. Your health care provider can help determine your BMI and help you achieve  or maintain a healthy weight.  For females 68 years of age and older:   A BMI below 18.5 is considered underweight.  A BMI of 18.5 to 24.9 is normal.  A BMI of 25 to 29.9 is considered overweight.  A BMI of 30 and above is considered obese.  Watch levels of cholesterol and blood lipids  You should start having your blood tested for lipids and cholesterol at 80 years of age, then have this test every 5 years.  You may need to have your cholesterol levels checked more often if:  Your lipid or cholesterol levels are high.  You are older than 80 years of age.  You are at high risk for heart disease.  CANCER SCREENING   Lung Cancer  Lung cancer screening is recommended for adults 55-66 years old who are at high risk for lung cancer because of a history of smoking.  A yearly low-dose CT scan of the lungs is recommended for people who:  Currently smoke.  Have quit within the past 15 years.  Have at least a 30-pack-year history of smoking. A pack year is smoking an average of one pack of cigarettes a day for 1 year.  Yearly screening should continue until it has been 15 years since you quit.  Yearly screening should stop if you develop a health problem that would prevent you from having lung cancer treatment.  Breast Cancer  Practice breast self-awareness. This means understanding how your breasts normally appear and feel.  It also  means doing regular breast self-exams. Let your health care provider know about any changes, no matter how small.  If you are in your 20s or 30s, you should have a clinical breast exam (CBE) by a health care provider every 1-3 years as part of a regular health exam.  If you are 50 or older, have a CBE every year. Also consider having a breast X-ray (mammogram) every year.  If you have a family history of breast cancer, talk to your health care provider about genetic screening.  If you are at high risk for breast cancer, talk to your health care provider about having an MRI and a mammogram every year.  Breast cancer gene (BRCA) assessment is recommended for women who have family members with BRCA-related cancers. BRCA-related cancers include:  Breast.  Ovarian.  Tubal.  Peritoneal cancers.  Results of the assessment will determine the need for genetic counseling and BRCA1 and BRCA2 testing. Cervical Cancer Your health care provider may recommend that you be screened regularly for cancer of the pelvic organs (ovaries, uterus, and vagina). This screening involves a pelvic examination, including checking for microscopic changes to the surface of your cervix (Pap test). You may be encouraged to have this screening done every 3 years, beginning at age 47.  For women ages 19-65, health care providers may recommend pelvic exams and Pap testing every 3 years, or they may recommend the Pap and pelvic exam, combined with testing for human papilloma virus (HPV), every 5 years. Some types of HPV increase your risk of cervical cancer. Testing for HPV may also be done on women of any age with unclear Pap test results.  Other health care providers may not recommend any screening for nonpregnant women who are considered low risk for pelvic cancer and who do not have symptoms. Ask your health care provider if a screening pelvic exam is right for you.  If you have had past treatment for cervical cancer or a  condition  that could lead to cancer, you need Pap tests and screening for cancer for at least 20 years after your treatment. If Pap tests have been discontinued, your risk factors (such as having a new sexual partner) need to be reassessed to determine if screening should resume. Some women have medical problems that increase the chance of getting cervical cancer. In these cases, your health care provider may recommend more frequent screening and Pap tests. Colorectal Cancer  This type of cancer can be detected and often prevented.  Routine colorectal cancer screening usually begins at 80 years of age and continues through 80 years of age.  Your health care provider may recommend screening at an earlier age if you have risk factors for colon cancer.  Your health care provider may also recommend using home test kits to check for hidden blood in the stool.  A small camera at the end of a tube can be used to examine your colon directly (sigmoidoscopy or colonoscopy). This is done to check for the earliest forms of colorectal cancer.  Routine screening usually begins at age 58.  Direct examination of the colon should be repeated every 5-10 years through 80 years of age. However, you may need to be screened more often if early forms of precancerous polyps or small growths are found. Skin Cancer  Check your skin from head to toe regularly.  Tell your health care provider about any new moles or changes in moles, especially if there is a change in a mole's shape or color.  Also tell your health care provider if you have a mole that is larger than the size of a pencil eraser.  Always use sunscreen. Apply sunscreen liberally and repeatedly throughout the day.  Protect yourself by wearing long sleeves, pants, a wide-brimmed hat, and sunglasses whenever you are outside. HEART DISEASE, DIABETES, AND HIGH BLOOD PRESSURE   High blood pressure causes heart disease and increases the risk of stroke. High  blood pressure is more likely to develop in:  People who have blood pressure in the high end of the normal range (130-139/85-89 mm Hg).  People who are overweight or obese.  People who are African American.  If you are 56-71 years of age, have your blood pressure checked every 3-5 years. If you are 27 years of age or older, have your blood pressure checked every year. You should have your blood pressure measured twice--once when you are at a hospital or clinic, and once when you are not at a hospital or clinic. Record the average of the two measurements. To check your blood pressure when you are not at a hospital or clinic, you can use:  An automated blood pressure machine at a pharmacy.  A home blood pressure monitor.  If you are between 8 years and 50 years old, ask your health care provider if you should take aspirin to prevent strokes.  Have regular diabetes screenings. This involves taking a blood sample to check your fasting blood sugar level.  If you are at a normal weight and have a low risk for diabetes, have this test once every three years after 80 years of age.  If you are overweight and have a high risk for diabetes, consider being tested at a younger age or more often. PREVENTING INFECTION  Hepatitis B  If you have a higher risk for hepatitis B, you should be screened for this virus. You are considered at high risk for hepatitis B if:  You were born in  a country where hepatitis B is common. Ask your health care provider which countries are considered high risk.  Your parents were born in a high-risk country, and you have not been immunized against hepatitis B (hepatitis B vaccine).  You have HIV or AIDS.  You use needles to inject street drugs.  You live with someone who has hepatitis B.  You have had sex with someone who has hepatitis B.  You get hemodialysis treatment.  You take certain medicines for conditions, including cancer, organ transplantation, and  autoimmune conditions. Hepatitis C  Blood testing is recommended for:  Everyone born from [birth year] through 1963.  Anyone with known risk factors for hepatitis C. Sexually transmitted infections (STIs)  You should be screened for sexually transmitted infections (STIs) including gonorrhea and chlamydia if:  You are sexually active and are younger than 80 years of age.  You are older than 80 years of age and your health care provider tells you that you are at risk for this type of infection.  Your sexual activity has changed since you were last screened and you are at an increased risk for chlamydia or gonorrhea. Ask your health care provider if you are at risk.  If you do not have HIV, but are at risk, it may be recommended that you take a prescription medicine daily to prevent HIV infection. This is called pre-exposure prophylaxis (PrEP). You are considered at risk if:  You are sexually active and do not regularly use condoms or know the HIV status of your partner(s).  You take drugs by injection.  You are sexually active with a partner who has HIV. Talk with your health care provider about whether you are at high risk of being infected with HIV. If you choose to begin PrEP, you should first be tested for HIV. You should then be tested every 3 months for as long as you are taking PrEP.  PREGNANCY   If you are premenopausal and you may become pregnant, ask your health care provider about preconception counseling.  If you may become pregnant, take 400 to 800 micrograms (mcg) of folic acid every day.  If you want to prevent pregnancy, talk to your health care provider about birth control (contraception). OSTEOPOROSIS AND MENOPAUSE   Osteoporosis is a disease in which the bones lose minerals and strength with aging. This can result in serious bone fractures. Your risk for osteoporosis can be identified using a bone density scan.  If you are 71 years of age or older, or if you are at risk  for osteoporosis and fractures, ask your health care provider if you should be screened.  Ask your health care provider whether you should take a calcium or vitamin D supplement to lower your risk for osteoporosis.  Menopause may have certain physical symptoms and risks.  Hormone replacement therapy may reduce some of these symptoms and risks. Talk to your health care provider about whether hormone replacement therapy is right for you.  HOME CARE INSTRUCTIONS   Schedule regular health, dental, and eye exams.  Stay current with your immunizations.   Do not use any tobacco products including cigarettes, chewing tobacco, or electronic cigarettes.  If you are pregnant, do not drink alcohol.  If you are breastfeeding, limit how much and how often you drink alcohol.  Limit alcohol intake to no more than 1 drink per day for nonpregnant women. One drink equals 12 ounces of beer, 5 ounces of wine, or 1 ounces of hard liquor.  Do not use street drugs.  Do not share needles.  Ask your health care provider for help if you need support or information about quitting drugs.  Tell your health care provider if you often feel depressed.  Tell your health care provider if you have ever been abused or do not feel safe at home.   This information is not intended to replace advice given to you by your health care provider. Make sure you discuss any questions you have with your health care provider.   Document Released: 09/16/2010 Document Revised: 03/24/2014 Document Reviewed: 02/02/2013 Elsevier Interactive Patient Education [employer].

## 2016-02-01 ENCOUNTER — Ambulatory Visit
Admission: RE | Admit: 2016-02-01 | Discharge: 2016-02-01 | Disposition: A | Discharge status: Home / Self Care | Payer: Medicare Other | Source: Ambulatory Visit | Attending: Family Medicine | Admitting: Family Medicine

## 2016-02-01 DIAGNOSIS — Z1239 Encounter for other screening for malignant neoplasm of breast: Secondary | ICD-10-CM

## 2016-02-01 DIAGNOSIS — Z1231 Encounter for screening mammogram for malignant neoplasm of breast: Secondary | ICD-10-CM | POA: Diagnosis not present

## 2016-02-06 ENCOUNTER — Other Ambulatory Visit: Payer: Self-pay | Admitting: Family Medicine

## 2016-02-06 DIAGNOSIS — R928 Other abnormal and inconclusive findings on diagnostic imaging of breast: Secondary | ICD-10-CM

## 2016-02-14 DIAGNOSIS — M9901 Segmental and somatic dysfunction of cervical region: Secondary | ICD-10-CM | POA: Diagnosis not present

## 2016-02-14 DIAGNOSIS — M5011 Cervical disc disorder with radiculopathy,  high cervical region: Secondary | ICD-10-CM | POA: Diagnosis not present

## 2016-02-14 DIAGNOSIS — M170 Bilateral primary osteoarthritis of knee: Secondary | ICD-10-CM | POA: Diagnosis not present

## 2016-02-14 DIAGNOSIS — H16143 Punctate keratitis, bilateral: Secondary | ICD-10-CM | POA: Diagnosis not present

## 2016-02-15 ENCOUNTER — Ambulatory Visit
Admission: RE | Admit: 2016-02-15 | Discharge: 2016-02-15 | Disposition: A | Discharge status: Home / Self Care | Payer: Medicare Other | Source: Ambulatory Visit | Attending: Family Medicine | Admitting: Family Medicine

## 2016-02-15 DIAGNOSIS — R928 Other abnormal and inconclusive findings on diagnostic imaging of breast: Secondary | ICD-10-CM | POA: Diagnosis not present

## 2016-02-18 ENCOUNTER — Telehealth: Payer: Self-pay

## 2016-02-18 NOTE — Telephone Encounter (Signed)
Request for surgical clearance:   1. What type surgery is being performed? 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 When is this surgery scheduled? 05/19/2016  3. Are there any medications that need to be held prior to surgery and how long? N/A; patient is taking ASA 81 mg QD  4. Name of the physician performing surgery: Dr Pilar Plate Aluisio  5. What is the office phone and fax number?   Phone [phone]  Fax [phone]

## 2016-02-20 ENCOUNTER — Encounter: Payer: Self-pay | Admitting: Cardiovascular Disease

## 2016-02-20 NOTE — Telephone Encounter (Signed)
Sent via epic 

## 2016-03-03 ENCOUNTER — Telehealth: Payer: Self-pay | Admitting: Family Medicine

## 2016-03-03 NOTE — Telephone Encounter (Signed)
Madison White with [facility] calling to check status of fax sent to office for refill of fish oil-omega-3 fatty acids 1000 MG capsule & Hydrocortisone ointment.  Requesting call back, [phone].

## 2016-03-03 NOTE — Telephone Encounter (Signed)
Tried calling on hold for over 5 mins, call ended due to needing to assist provider. Pt needs office visit for these medications to be filled. Pt has apt 04/17/16 and this will be addressed then.

## 2016-03-06 NOTE — Telephone Encounter (Signed)
Faxed clearance letter to Velvet Teal at [facility]. 

## 2016-03-20 DIAGNOSIS — M5011 Cervical disc disorder with radiculopathy,  high cervical region: Secondary | ICD-10-CM | POA: Diagnosis not present

## 2016-03-20 DIAGNOSIS — M9901 Segmental and somatic dysfunction of cervical region: Secondary | ICD-10-CM | POA: Diagnosis not present

## 2016-04-17 ENCOUNTER — Ambulatory Visit: Payer: Self-pay | Admitting: Family Medicine

## 2016-04-30 DIAGNOSIS — M25561 Pain in right knee: Secondary | ICD-10-CM | POA: Diagnosis not present

## 2016-04-30 DIAGNOSIS — Z01818 Encounter for other preprocedural examination: Secondary | ICD-10-CM | POA: Diagnosis not present

## 2016-04-30 DIAGNOSIS — M25562 Pain in left knee: Secondary | ICD-10-CM | POA: Diagnosis not present

## 2016-04-30 DIAGNOSIS — M1711 Unilateral primary osteoarthritis, right knee: Secondary | ICD-10-CM | POA: Diagnosis not present

## 2016-04-30 DIAGNOSIS — M25462 Effusion, left knee: Secondary | ICD-10-CM | POA: Diagnosis not present

## 2016-04-30 DIAGNOSIS — M170 Bilateral primary osteoarthritis of knee: Secondary | ICD-10-CM | POA: Diagnosis not present

## 2016-05-01 DIAGNOSIS — M1711 Unilateral primary osteoarthritis, right knee: Secondary | ICD-10-CM | POA: Diagnosis not present

## 2016-05-15 DIAGNOSIS — R911 Solitary pulmonary nodule: Secondary | ICD-10-CM

## 2016-05-15 HISTORY — PX: OTHER SURGICAL HISTORY: SHX169

## 2016-05-15 HISTORY — DX: Solitary pulmonary nodule: R91.1

## 2016-05-19 ENCOUNTER — Encounter (HOSPITAL_COMMUNITY): Payer: Self-pay

## 2016-05-19 ENCOUNTER — Inpatient Hospital Stay (HOSPITAL_COMMUNITY): Admit: 2016-05-19 | Payer: Medicare Other | Admitting: Orthopedic Surgery

## 2016-05-19 SURGERY — ARTHROPLASTY, KNEE, TOTAL
Anesthesia: Choice | Site: Knee | Laterality: Right

## 2016-05-23 ENCOUNTER — Ambulatory Visit (INDEPENDENT_AMBULATORY_CARE_PROVIDER_SITE_OTHER): Payer: Medicare Other | Admitting: Family Medicine

## 2016-05-23 ENCOUNTER — Encounter: Payer: Self-pay | Admitting: Family Medicine

## 2016-05-23 VITALS — BP 155/78 | HR 79 | Temp 97.6°F | Resp 16 | Wt 146.8 lb

## 2016-05-23 DIAGNOSIS — J0100 Acute maxillary sinusitis, unspecified: Secondary | ICD-10-CM

## 2016-05-23 MED ORDER — AZITHROMYCIN 250 MG PO TABS: ORAL_TABLET | ORAL | 0 refills | Status: DC

## 2016-05-23 NOTE — Progress Notes (Signed)
Kathlene November , 01/04/1929, 83 y.o., female MRN: [id number] Patient Care Team    Relationship Specialty Notifications Start End  Tammi Sou, MD PCP - General Family Medicine  05/28/11    Comment: Joycelyn Rua, MD Consulting Physician Cardiology  01/21/12   Pieter Partridge, [place] Physician Neurology  12/28/14   Ninetta Lights, MD Consulting Physician Orthopedic Surgery  10/25/15   Gaynelle Arabian, MD Consulting Physician Orthopedic Surgery  01/07/16   Crista Luria, MD Consulting Physician Dermatology  01/07/16   Felton Clinton  Optometry  01/07/16   [place]  Dentistry  01/07/16     CC: Head congestion Subjective: Pt presents for an acute OV with complaints of head congestion of a few weeks duration.  Associated symptoms include dry cough, increased phlegm production, nasal congestion. Patient states she had an acute illness approximately one month ago in which she thought she had a "cold ". She states it took her quite a long time to get over a cold. She then noticed over the last week or so she started having a cough and increased phlegm production again. She denies fevers, chills, nausea, vomit, dizziness or diarrhea. She admits to mild ear pressure and sinus pressure. She has restarted her Allegra yesterday and Mucinex. She is worried she will be sick, and she has surgery on Tuesday.  Depression screen PHQ 2/9 01/07/2016  Decreased Interest 0  Down, Depressed, Hopeless 0  PHQ - 2 Score 0    Allergies  Allergen Reactions  . Avapro [Irbesartan] Other (See Comments)    Unknown rxn  . Lisinopril Swelling    Swelling around eyes; also says it caused increased sugar and BP  . Penicillins Hives  . Simvastatin Other (See Comments)    unknown  . Latex Rash  . Nickel Rash and Swelling  . Sulfa Antibiotics Rash  . Sulfamethoxazole Rash   Social History  Substance Use Topics  . Smoking status: Former Smoker    Quit date: 05/31/1958  . Smokeless tobacco: Never Used  .  Alcohol use 0.6 oz/week    1 Glasses of wine per week   Past Medical History:  Diagnosis Date  . Diastolic dysfunction, left ventricle   . GERD (gastroesophageal reflux disease)   . 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  [name] History of blood transfusion   . History of rheumatic fever   . HTN (hypertension)    hx of refusing treatment--White coat HTN and/or situational HTN (?)   . Hyperlipidemia    hx of refusing treatment  . Insulin resistance    A1c's excellent per old records (6.1 in 2008 and 2009)  . Left bundle branch block 12/2011   Dr. Sallyanne Kuster at [facility]; ECHO and myocardial perfusion scan showed  septal and apical wall motion abnormality but she had no significant valvular disease and no ischemia.  She did have mildly decreased EF (39% on lexiscan and 50% on echo) and abnl LV relaxation.  Mild amount of PVCs.  Pt at higher risk for other conduction abnormalities, good chance of eventually requiring a pacemaker.   [name] LVH (left ventricular hypertrophy)   . Nonischemic cardiomyopathy ([place]) 2016   LV dysfunction due to LBBB/septal dyssynchrony  . Osteoarthritis    bilat  knees--needs bilat TKA.  Ortho is trying steroid injections as of 10/23/15.  . Seasonal allergies    Past Surgical History:  Procedure Laterality Date  . ABDOMINAL HYSTERECTOMY  1963   At the time of her last C/S; says she had partial bladder resection at that time as well  . CARDIOVASCULAR STRESS TEST  08/2005 & 12/2011   Low risk scans (on the 12/2011 scan she did have EF 39% with moderately severe LV dysfunction with septal dyssynergy probably contributed by LBBB  . Carotid dopplers  11/22/14   NORMAL  . CATARACT EXTRACTION  08/14/11   both  . CESAREAN SECTION  X 5   One of her neonates  died soon after birth  . CHOLECYSTECTOMY     1980's  . STRABISMUS SURGERY  1939  . TONSILLECTOMY  1938  . TRANSTHORACIC ECHOCARDIOGRAM  01/13/12   Septal dyssynergy, EF 50%, LV relaxation impaired.  No significant valvular abnormalities.   Family History  Problem Relation Age of Onset  . Hypertension Mother   . Heart disease Mother   . Hyperlipidemia Mother   . Diabetes Mother   . Heart disease Father   . Cancer Father    Allergies as of 05/23/2016      Reactions   Avapro [irbesartan] Other (See Comments)   Unknown rxn   Lisinopril Swelling   Swelling around eyes; also says it caused increased sugar and BP   Penicillins Hives   Simvastatin Other (See Comments)   unknown   Latex Rash   Nickel Rash, Swelling   Sulfa Antibiotics Rash   Sulfamethoxazole Rash      Medication List       Accurate as of 05/23/16  2:38 PM. Always use your most recent med list.          aspirin 81 MG tablet Take 1 tablet (81 mg total) by mouth daily.   CALCIUM 600 + D PO Take 1 tablet by mouth daily.   CENTRUM PO Take by mouth.   fexofenadine 180 MG tablet Commonly known as:  ALLEGRA Take 180 mg by mouth daily.   fish oil-omega-3 fatty acids 1000 MG capsule Take 2 g by mouth daily.   ibuprofen 200 MG tablet Commonly known as:  ADVIL,MOTRIN Take 200 mg by mouth 2 (two) times daily.   omeprazole 20 MG capsule Commonly known as:  PRILOSEC Take 1 capsule (20 mg total) by mouth daily as needed.   VITAMIN B-12 CR PO Take by mouth.   vitamin C 500 MG tablet Commonly known as:  ASCORBIC ACID Take 500 mg by mouth.   vitamin E 1000 UNIT capsule Take 1,000 Units by mouth daily.       No results found for this or any previous visit (from the past 24 hour(s)). No results found.   ROS: Negative, with the exception of above mentioned in HPI   Objective:  BP (!) 155/78 (BP Location: Left Arm, Patient Position: Sitting, Cuff Size: Normal)   Pulse 79   Temp 97.6 F (36.4 C)  (Oral)   Resp 16   Wt 146 lb 12 oz (66.6 kg)   SpO2 97%   BMI 27.73 kg/m  Body mass index is 27.73 kg/m. Gen: Afebrile. No acute distress. Nontoxic in appearance, well developed, well nourished. Very pleasant Caucasian female. HENT: AT. [place]. Bilateral TM visualized bilateral fullness, no erythema. MMM, no oral lesions. Bilateral nares with erythema and drainage. Throat without erythema or exudates. Postnasal drip present, mild tenderness to palpation  sinus cavity. No cough appreciated. No hoarseness appreciated. Eyes:Pupils Equal Round Reactive to light, Extraocular movements intact,  Conjunctiva without redness, discharge or icterus. Neck/lymp/endocrine: Supple, no lymphadenopathy CV: RRR, no edema Chest: CTAB, no wheeze or crackles. Good air movement, normal resp effort.  Abd: Soft. NTND. BS present  Assessment/Plan: CHARLIENE INOUE is a 83 y.o. female present for OV for  1. Acute maxillary sinusitis, recurrence not specified - Discussed with patient and her daughter today this appears to be a mild sinus infection. Given that she had surgery on Tuesday, do not want to wait to treat. Therefore patient encouraged to rest, hydrate, use Mucinex, use Claritin daily and prescribed a Z-Pak. - Follow-up as needed  Reviewed expectations re: course of current medical issues.  Discussed self-management of symptoms.  Outlined signs and symptoms indicating need for more acute intervention.  Patient verbalized understanding and all questions were answered.  Patient received an After-Visit Summary.   electronically signed by:  Howard Pouch, DO  [place]

## 2016-05-23 NOTE — Patient Instructions (Signed)
Allegra daily. Mucinex for phlegm production.  Rest, hydrate.  Z-pack start  Good luck on your surgery Tuesday!

## 2016-05-23 NOTE — Progress Notes (Signed)
Pre visit review using our clinic review tool, if applicable. No additional management support is needed unless otherwise documented below in the visit note. 

## 2016-05-27 DIAGNOSIS — I447 Left bundle-branch block, unspecified: Secondary | ICD-10-CM | POA: Diagnosis not present

## 2016-05-27 DIAGNOSIS — Z471 Aftercare following joint replacement surgery: Secondary | ICD-10-CM | POA: Diagnosis not present

## 2016-05-27 DIAGNOSIS — K219 Gastro-esophageal reflux disease without esophagitis: Secondary | ICD-10-CM | POA: Diagnosis not present

## 2016-05-27 DIAGNOSIS — Z4733 Aftercare following explantation of knee joint prosthesis: Secondary | ICD-10-CM | POA: Diagnosis not present

## 2016-05-27 DIAGNOSIS — Z01818 Encounter for other preprocedural examination: Secondary | ICD-10-CM | POA: Diagnosis not present

## 2016-05-27 DIAGNOSIS — M1711 Unilateral primary osteoarthritis, right knee: Secondary | ICD-10-CM | POA: Diagnosis not present

## 2016-05-27 DIAGNOSIS — K5900 Constipation, unspecified: Secondary | ICD-10-CM | POA: Diagnosis not present

## 2016-05-27 DIAGNOSIS — I429 Cardiomyopathy, unspecified: Secondary | ICD-10-CM | POA: Diagnosis not present

## 2016-05-27 DIAGNOSIS — M1990 Unspecified osteoarthritis, unspecified site: Secondary | ICD-10-CM | POA: Diagnosis not present

## 2016-05-27 DIAGNOSIS — G8918 Other acute postprocedural pain: Secondary | ICD-10-CM | POA: Diagnosis not present

## 2016-05-27 DIAGNOSIS — I10 Essential (primary) hypertension: Secondary | ICD-10-CM | POA: Diagnosis not present

## 2016-05-27 DIAGNOSIS — Z96651 Presence of right artificial knee joint: Secondary | ICD-10-CM | POA: Diagnosis not present

## 2016-05-27 DIAGNOSIS — I428 Other cardiomyopathies: Secondary | ICD-10-CM | POA: Diagnosis not present

## 2016-05-27 DIAGNOSIS — Z87891 Personal history of nicotine dependence: Secondary | ICD-10-CM | POA: Diagnosis not present

## 2016-05-27 DIAGNOSIS — T8484XD Pain due to internal orthopedic prosthetic devices, implants and grafts, subsequent encounter: Secondary | ICD-10-CM | POA: Diagnosis not present

## 2016-05-27 DIAGNOSIS — M6281 Muscle weakness (generalized): Secondary | ICD-10-CM | POA: Diagnosis not present

## 2016-05-29 DIAGNOSIS — K219 Gastro-esophageal reflux disease without esophagitis: Secondary | ICD-10-CM | POA: Diagnosis not present

## 2016-05-29 DIAGNOSIS — Z01818 Encounter for other preprocedural examination: Secondary | ICD-10-CM | POA: Diagnosis not present

## 2016-05-29 DIAGNOSIS — M6281 Muscle weakness (generalized): Secondary | ICD-10-CM | POA: Diagnosis not present

## 2016-05-29 DIAGNOSIS — Z4733 Aftercare following explantation of knee joint prosthesis: Secondary | ICD-10-CM | POA: Diagnosis not present

## 2016-05-29 DIAGNOSIS — I429 Cardiomyopathy, unspecified: Secondary | ICD-10-CM | POA: Diagnosis not present

## 2016-05-29 DIAGNOSIS — I447 Left bundle-branch block, unspecified: Secondary | ICD-10-CM | POA: Diagnosis not present

## 2016-05-29 DIAGNOSIS — M1990 Unspecified osteoarthritis, unspecified site: Secondary | ICD-10-CM | POA: Diagnosis not present

## 2016-05-29 DIAGNOSIS — K5900 Constipation, unspecified: Secondary | ICD-10-CM | POA: Diagnosis not present

## 2016-05-29 DIAGNOSIS — T8484XD Pain due to internal orthopedic prosthetic devices, implants and grafts, subsequent encounter: Secondary | ICD-10-CM | POA: Diagnosis not present

## 2016-05-29 DIAGNOSIS — J209 Acute bronchitis, unspecified: Secondary | ICD-10-CM | POA: Diagnosis not present

## 2016-05-29 DIAGNOSIS — I428 Other cardiomyopathies: Secondary | ICD-10-CM | POA: Diagnosis not present

## 2016-05-29 DIAGNOSIS — M1711 Unilateral primary osteoarthritis, right knee: Secondary | ICD-10-CM | POA: Diagnosis not present

## 2016-05-29 DIAGNOSIS — Z471 Aftercare following joint replacement surgery: Secondary | ICD-10-CM | POA: Diagnosis not present

## 2016-05-29 DIAGNOSIS — I10 Essential (primary) hypertension: Secondary | ICD-10-CM | POA: Diagnosis not present

## 2016-05-29 DIAGNOSIS — Z96651 Presence of right artificial knee joint: Secondary | ICD-10-CM | POA: Diagnosis not present

## 2016-06-06 DIAGNOSIS — J209 Acute bronchitis, unspecified: Secondary | ICD-10-CM | POA: Diagnosis not present

## 2016-06-07 DIAGNOSIS — I428 Other cardiomyopathies: Secondary | ICD-10-CM | POA: Diagnosis not present

## 2016-06-07 DIAGNOSIS — K5900 Constipation, unspecified: Secondary | ICD-10-CM | POA: Diagnosis not present

## 2016-06-07 DIAGNOSIS — Z96651 Presence of right artificial knee joint: Secondary | ICD-10-CM | POA: Diagnosis not present

## 2016-06-07 DIAGNOSIS — I447 Left bundle-branch block, unspecified: Secondary | ICD-10-CM | POA: Diagnosis not present

## 2016-06-07 DIAGNOSIS — Z7982 Long term (current) use of aspirin: Secondary | ICD-10-CM | POA: Diagnosis not present

## 2016-06-07 DIAGNOSIS — Z471 Aftercare following joint replacement surgery: Secondary | ICD-10-CM | POA: Diagnosis not present

## 2016-06-07 DIAGNOSIS — K219 Gastro-esophageal reflux disease without esophagitis: Secondary | ICD-10-CM | POA: Diagnosis not present

## 2016-06-07 DIAGNOSIS — I10 Essential (primary) hypertension: Secondary | ICD-10-CM | POA: Diagnosis not present

## 2016-06-09 DIAGNOSIS — I447 Left bundle-branch block, unspecified: Secondary | ICD-10-CM | POA: Diagnosis not present

## 2016-06-09 DIAGNOSIS — Z7982 Long term (current) use of aspirin: Secondary | ICD-10-CM | POA: Diagnosis not present

## 2016-06-09 DIAGNOSIS — I428 Other cardiomyopathies: Secondary | ICD-10-CM | POA: Diagnosis not present

## 2016-06-09 DIAGNOSIS — I10 Essential (primary) hypertension: Secondary | ICD-10-CM | POA: Diagnosis not present

## 2016-06-09 DIAGNOSIS — K219 Gastro-esophageal reflux disease without esophagitis: Secondary | ICD-10-CM | POA: Diagnosis not present

## 2016-06-09 DIAGNOSIS — K5900 Constipation, unspecified: Secondary | ICD-10-CM | POA: Diagnosis not present

## 2016-06-09 DIAGNOSIS — Z96651 Presence of right artificial knee joint: Secondary | ICD-10-CM | POA: Diagnosis not present

## 2016-06-09 DIAGNOSIS — Z471 Aftercare following joint replacement surgery: Secondary | ICD-10-CM | POA: Diagnosis not present

## 2016-06-11 ENCOUNTER — Ambulatory Visit (INDEPENDENT_AMBULATORY_CARE_PROVIDER_SITE_OTHER): Payer: Medicare Other | Admitting: Physician Assistant

## 2016-06-11 ENCOUNTER — Ambulatory Visit (INDEPENDENT_AMBULATORY_CARE_PROVIDER_SITE_OTHER): Payer: Medicare Other

## 2016-06-11 ENCOUNTER — Encounter: Payer: Self-pay | Admitting: Physician Assistant

## 2016-06-11 VITALS — BP 160/96 | HR 81 | Temp 98.0°F | Ht 61.0 in | Wt 145.2 lb

## 2016-06-11 DIAGNOSIS — R05 Cough: Secondary | ICD-10-CM

## 2016-06-11 DIAGNOSIS — Z96651 Presence of right artificial knee joint: Secondary | ICD-10-CM | POA: Diagnosis not present

## 2016-06-11 DIAGNOSIS — J40 Bronchitis, not specified as acute or chronic: Secondary | ICD-10-CM | POA: Diagnosis not present

## 2016-06-11 DIAGNOSIS — I428 Other cardiomyopathies: Secondary | ICD-10-CM | POA: Diagnosis not present

## 2016-06-11 DIAGNOSIS — R059 Cough, unspecified: Secondary | ICD-10-CM

## 2016-06-11 DIAGNOSIS — Z7982 Long term (current) use of aspirin: Secondary | ICD-10-CM | POA: Diagnosis not present

## 2016-06-11 DIAGNOSIS — Z471 Aftercare following joint replacement surgery: Secondary | ICD-10-CM | POA: Diagnosis not present

## 2016-06-11 DIAGNOSIS — K219 Gastro-esophageal reflux disease without esophagitis: Secondary | ICD-10-CM | POA: Diagnosis not present

## 2016-06-11 DIAGNOSIS — I10 Essential (primary) hypertension: Secondary | ICD-10-CM | POA: Diagnosis not present

## 2016-06-11 DIAGNOSIS — K5900 Constipation, unspecified: Secondary | ICD-10-CM | POA: Diagnosis not present

## 2016-06-11 DIAGNOSIS — I447 Left bundle-branch block, unspecified: Secondary | ICD-10-CM | POA: Diagnosis not present

## 2016-06-11 LAB — COMPREHENSIVE METABOLIC PANEL
ALK PHOS: 53 U/L (ref 39–117)
ALT: 14 U/L (ref 0–35)
AST: 19 U/L (ref 0–37)
Albumin: 4 g/dL (ref 3.5–5.2)
BILIRUBIN TOTAL: 0.4 mg/dL (ref 0.2–1.2)
BUN: 11 mg/dL (ref 6–23)
CO2: 29 meq/L (ref 19–32)
CREATININE: 0.75 mg/dL (ref 0.40–1.20)
Calcium: 9.9 mg/dL (ref 8.4–10.5)
Chloride: 102 mEq/L (ref 96–112)
GFR: 77.97 mL/min (ref >60.00)
GLUCOSE: 138 mg/dL — AB (ref 70–99)
Potassium: 4.2 mEq/L (ref 3.5–5.1)
Sodium: 140 mEq/L (ref 135–145)
TOTAL PROTEIN: 7.7 g/dL (ref 6.0–8.3)

## 2016-06-11 LAB — CBC WITH DIFFERENTIAL/PLATELET
BASOS ABS: 0.1 10*3/uL (ref 0.0–0.1)
Basophils Relative: 0.6 % (ref 0.0–3.0)
EOS ABS: 0.2 10*3/uL (ref 0.0–0.7)
Eosinophils Relative: 1.3 % (ref 0.0–5.0)
HCT: 41.6 % (ref 36.0–46.0)
Hemoglobin: 13.8 g/dL (ref 12.0–15.0)
Lymphocytes Relative: 16 % (ref 12.0–46.0)
Lymphs Abs: 2 10*3/uL (ref 0.7–4.0)
MCHC: 33.1 g/dL (ref 30.0–36.0)
MCV: 87 fl (ref 78.0–100.0)
MONOS PCT: 5.3 % (ref 3.0–12.0)
Monocytes Absolute: 0.7 10*3/uL (ref 0.1–1.0)
NEUTROS ABS: 9.4 10*3/uL — AB (ref 1.4–7.7)
NEUTROS PCT: 76.8 % (ref 43.0–77.0)
PLATELETS: 522 10*3/uL — AB (ref 150.0–400.0)
RBC: 4.78 Mil/uL (ref 3.87–5.11)
RDW: 14.5 % (ref 11.5–15.5)
WBC: 12.3 10*3/uL — ABNORMAL HIGH (ref 4.0–10.5)

## 2016-06-11 MED ORDER — ALBUTEROL SULFATE HFA 108 (90 BASE) MCG/ACT IN AERS: 2.0000 | INHALATION_SPRAY | Freq: Four times a day (QID) | RESPIRATORY_TRACT | 0 refills | Status: DC | PRN

## 2016-06-11 NOTE — Progress Notes (Addendum)
Madison White is a 83 y.o. female here for cough x 1.5 weeks.  History of Present Illness:   Chief Complaint  Patient presents with  . Cough    x 1.5 weeks, expectorating white sputum, rattle in chest at night  . Chest congestion    HPI   Cough started a few weeks ago. She was seen on March 9 and diagnosed with sinusitis and given a Z-Pak. She then had surgery on her knee and went to a rehabilitation facility afterwards. While she was there she was diagnosed with bronchitis and started on a second Z-Pak on March 23. Did receive a flu vaccine this year.  Denies fevers, SOB, lower leg pain. Appetite is a little diminished since she has returned home from rehab. Diarrhea before surgery. Drinking more water, staying hydrated as much as possible. Working with physical therapy. Works out for 10 min x 3 times a day. No history of blood clots. Off of her ASA. She is seeing her surgeon this afternoon for her first postop visit. She is wondering why she still has this cough and she has been expectorating white sputum and occassionally when lying fat has a rattle in her chest at night. Daughter reports that family is going out of town next week so she wants to make sure that patient does not have pneumonia. She denies any urinary symptoms. She is taking Allegra daily. She is also using Tessalon as needed. She was taking Mucinex but is no longer taking this.  Further chart review reveals patient was instructed at discharge to take 2 x 325 mg tablets of aspirin daily for 6 weeks patient has not been taking this since Friday. I confirmed this with the granddaughter, who states that she has been refusing some of her medications and has not been taking this. Granddaughter also admits that she was confused, and was giving the patient Tylenol when necessary.  Per chart review, white count was 17.2 on March 14. And 14.1 on March 15.  PMHx, SurgHx, SocialHx, Medications, and Allergies were reviewed in the Visit  Navigator and updated as appropriate.  Current Medications:   Current Outpatient Prescriptions:  .  acetaminophen (TYLENOL) 500 MG tablet, Take 1,000 mg by mouth., Disp: , Rfl:  .  benzonatate (TESSALON) 100 MG capsule, TAKE ONE CAPSULE BY MOUTH 3 TIMES A DAY AS NEEDED, Disp: , Rfl: 0 .  fexofenadine (ALLEGRA) 180 MG tablet, Take 180 mg by mouth daily., Disp: , Rfl:  .  ibuprofen (ADVIL,MOTRIN) 200 MG tablet, Take 200 mg by mouth 2 (two) times daily., Disp: , Rfl:  .  albuterol (PROVENTIL HFA;VENTOLIN HFA) 108 (90 Base) MCG/ACT inhaler, Inhale 2 puffs into the lungs every 6 (six) hours as needed for wheezing or shortness of breath., Disp: 1 Inhaler, Rfl: 0 .  aspirin 81 MG tablet, Take 1 tablet (81 mg total) by mouth daily. (Patient not taking: Reported on 06/11/2016), Disp: 30 tablet, Rfl: 1 .  Calcium Carbonate-Vitamin D (CALCIUM 600 + D PO), Take 1 tablet by mouth daily., Disp: , Rfl:  .  Cyanocobalamin (VITAMIN B-12 CR PO), Take by mouth., Disp: , Rfl:  .  fish oil-omega-3 fatty acids 1000 MG capsule, Take 2 g by mouth daily., Disp: , Rfl:  .  Multiple Vitamins-Minerals (CENTRUM PO), Take by mouth., Disp: , Rfl:  .  vitamin C (ASCORBIC ACID) 500 MG tablet, Take 500 mg by mouth., Disp: , Rfl:  .  vitamin E 1000 UNIT capsule, Take 1,000 Units by  mouth daily., Disp: , Rfl:    Review of Systems:   Review of Systems  Constitutional: Positive for chills and malaise/fatigue. Negative for fever and weight loss.  HENT: Positive for congestion.   Respiratory: Positive for cough and sputum production.        Expectorating white sputum, rattle in chest at night.  Cardiovascular: Negative for chest pain, palpitations and claudication.  Genitourinary: Negative for dysuria, frequency and urgency.  Musculoskeletal: Positive for joint pain.  Neurological: Negative for dizziness, sensory change and focal weakness.    Vitals:   Vitals:   06/11/16 0949 06/11/16 1014  BP: (!) 180/100 (!) 160/96   Pulse: 81   Temp: 98 F (36.7 C)   TempSrc: Oral   SpO2: 97%   Weight: 145 lb 4 oz (65.9 kg)   Height: 5\' 1"  (1.549 m)      Body mass index is 27.44 kg/m.  Physical Exam:   Physical Exam  Constitutional: She appears well-developed. She is cooperative.  Non-toxic appearance. She does not have a sickly appearance. She does not appear ill. No distress.  HENT:  Head: Normocephalic and atraumatic.  Right Ear: Tympanic membrane, external ear and ear canal normal. Tympanic membrane is not erythematous, not retracted and not bulging.  Left Ear: Tympanic membrane, external ear and ear canal normal. Tympanic membrane is not erythematous, not retracted and not bulging.  Nose: Nose normal. Right sinus exhibits no maxillary sinus tenderness and no frontal sinus tenderness. Left sinus exhibits no maxillary sinus tenderness and no frontal sinus tenderness.  Mouth/Throat: Uvula is midline. No posterior oropharyngeal edema or posterior oropharyngeal erythema.  Eyes: Conjunctivae and lids are normal.  Neck: Trachea normal.  Cardiovascular: Normal rate, regular rhythm, S1 normal, S2 normal and normal heart sounds.   Pulmonary/Chest: Effort normal. No accessory muscle usage. No respiratory distress. She has decreased breath sounds. She has no wheezes. She has no rhonchi. She has no rales.  Productive cough throughout encounter  Musculoskeletal:  Swelling to right knee, no pain or swelling in either calves, negative Homan sign bilaterally  Lymphadenopathy:    She has no cervical adenopathy.  Neurological: She is alert.  Skin: Skin is warm, dry and intact.  Psychiatric: She has a normal mood and affect. Her speech is normal and behavior is normal.  Nursing note and vitals reviewed.   CXR PA and lateral: IMPRESSION: No active cardiopulmonary disease. Aortic atherosclerosis.   Assessment and Plan:    Madison White was seen today for cough and chest congestion.  Diagnoses and all orders for this  visit:  Cough -     DG Chest 2 View; Future -     CBC with Differential/Platelet -     Comprehensive metabolic panel  Other orders -     albuterol (PROVENTIL HFA;VENTOLIN HFA) 108 (90 Base) MCG/ACT inhaler; Inhale 2 puffs into the lungs every 6 (six) hours as needed for wheezing or shortness of breath.   Chest x-ray unrevealing. Patient is going to see her surgeon this afternoon to follow-up on her postop knee replacement, I encouraged patient and daughter to let surgeon evaluate swelling of knee and discuss any issues that patient is having with residual pain. I suspect that she has some lingering bronchitis. I have given her albuterol to help with for her cough and wheezing at night. She is not wheezing in my office today. I encouraged her to continue the Mucinex and allergy pill. I advised patient that if her symptoms change in any way I  would like for her to go seek medical attention.  Addendum: White count 12.3 and platelets elevated at 522. I discussed these results with Dr. [name] and Dr. Teresa Coombs. I spoke with patient's granddaughter over the phone. We discussed the patient's lab results and her platelets being elevated. I also discussed that the recommendations on the discharge summary from Dr. Jefferson Fuel was for the patient to be on 2325 mg aspirin daily or 6 weeks.I cannot rule out PE in this patient. Given her recent surgery and immobility with rehabilitation, I believe that a CT angiogram is warranted. I discussed this with granddaughter and she said that she understands and will go to the appointment at 3 PM with Dr. Jefferson Fuel and talk to the surgeons about it while there. I have called Dr. Jefferson Fuel office, and I informed them that we faxed over her lab results, and I expressed my concerns of patient not being on anticoagulation and having persistent cough. I discussed with granddaughter that if patient develops any rapid onset shortness of breath, coughing up blood, lower leg pain,  or severe chest pain she needs to immediately go to the ER. Granddaughter verbalized understanding.  . Reviewed expectations re: course of current medical issues. . Discussed self-management of symptoms. . Outlined signs and symptoms indicating need for more acute intervention. . Patient verbalized understanding and all questions were answered. . See orders for this visit as documented in the electronic medical record. . Patient received an After-Visit Summary.   Inda Coke, PA-C

## 2016-06-11 NOTE — Patient Instructions (Addendum)
It was great meeting you today!  Please go to the lab to have a chest xray completed, we will call you with the results.  You may take Tessalon Perles for your cough during the day. I have sent in albuterol inhaler, you may use every 4- 6 hours. You may also continue Mucinex.   Cough, Adult Coughing is a reflex that clears your throat and your airways. Coughing helps to heal and protect your lungs. It is normal to cough occasionally, but a cough that happens with other symptoms or lasts a long time may be a sign of a condition that needs treatment. A cough may last only 2-3 weeks (acute), or it may last longer than 8 weeks (chronic). What are the causes? Coughing is commonly caused by:  Breathing in substances that irritate your lungs.  A viral or bacterial respiratory infection.  Allergies.  Asthma.  Postnasal drip.  Smoking.  Acid backing up from the stomach into the esophagus (gastroesophageal reflux).  Certain medicines.  Chronic lung problems, including COPD (or rarely, lung cancer).  Other medical conditions such as heart failure. Follow these instructions at home: Pay attention to any changes in your symptoms. Take these actions to help with your discomfort:  Take medicines only as told by your health care provider.  If you were prescribed an antibiotic medicine, take it as told by your health care provider. Do not stop taking the antibiotic even if you start to feel better.  Talk with your health care provider before you take a cough suppressant medicine.  Drink enough fluid to keep your urine clear or pale yellow.  If the air is dry, use a cold steam vaporizer or humidifier in your bedroom or your home to help loosen secretions.  Avoid anything that causes you to cough at work or at home.  If your cough is worse at night, try sleeping in a semi-upright position.  Avoid cigarette smoke. If you smoke, quit smoking. If you need help quitting, ask your health care  provider.  Avoid caffeine.  Avoid alcohol.  Rest as needed. Contact a health care provider if:  You have new symptoms.  You cough up pus.  Your cough does not get better after 2-3 weeks, or your cough gets worse.  You cannot control your cough with suppressant medicines and you are losing sleep.  You develop pain that is getting worse or pain that is not controlled with pain medicines.  You have a fever.  You have unexplained weight loss.  You have night sweats. Get help right away if:  You cough up blood.  You have difficulty breathing.  Your heartbeat is very fast. This information is not intended to replace advice given to you by your health care provider. Make sure you discuss any questions you have with your health care provider. Document Released: 08/30/2010 Document Revised: 08/09/2015 Document Reviewed: 05/10/2014 Elsevier Interactive Patient Education  2017 [employer].

## 2016-06-11 NOTE — Progress Notes (Signed)
Pre visit review using our clinic review tool, if applicable. No additional management support is needed unless otherwise documented below in the visit note. 

## 2016-06-12 ENCOUNTER — Telehealth: Payer: Self-pay | Admitting: Physician Assistant

## 2016-06-12 ENCOUNTER — Ambulatory Visit (HOSPITAL_COMMUNITY)
Admission: RE | Admit: 2016-06-12 | Discharge: 2016-06-12 | Disposition: A | Discharge status: Home / Self Care | Payer: Medicare Other | Source: Ambulatory Visit | Attending: Physician Assistant | Admitting: Physician Assistant

## 2016-06-12 DIAGNOSIS — I428 Other cardiomyopathies: Secondary | ICD-10-CM | POA: Diagnosis not present

## 2016-06-12 DIAGNOSIS — R05 Cough: Secondary | ICD-10-CM | POA: Insufficient documentation

## 2016-06-12 DIAGNOSIS — Z471 Aftercare following joint replacement surgery: Secondary | ICD-10-CM | POA: Diagnosis not present

## 2016-06-12 DIAGNOSIS — I700 Atherosclerosis of aorta: Secondary | ICD-10-CM | POA: Insufficient documentation

## 2016-06-12 DIAGNOSIS — M47894 Other spondylosis, thoracic region: Secondary | ICD-10-CM | POA: Insufficient documentation

## 2016-06-12 DIAGNOSIS — Z96651 Presence of right artificial knee joint: Secondary | ICD-10-CM | POA: Diagnosis not present

## 2016-06-12 DIAGNOSIS — K5900 Constipation, unspecified: Secondary | ICD-10-CM | POA: Diagnosis not present

## 2016-06-12 DIAGNOSIS — K219 Gastro-esophageal reflux disease without esophagitis: Secondary | ICD-10-CM | POA: Diagnosis not present

## 2016-06-12 DIAGNOSIS — R911 Solitary pulmonary nodule: Secondary | ICD-10-CM | POA: Insufficient documentation

## 2016-06-12 DIAGNOSIS — I447 Left bundle-branch block, unspecified: Secondary | ICD-10-CM | POA: Diagnosis not present

## 2016-06-12 DIAGNOSIS — Z7982 Long term (current) use of aspirin: Secondary | ICD-10-CM | POA: Diagnosis not present

## 2016-06-12 DIAGNOSIS — I10 Essential (primary) hypertension: Secondary | ICD-10-CM | POA: Diagnosis not present

## 2016-06-12 DIAGNOSIS — R059 Cough, unspecified: Secondary | ICD-10-CM

## 2016-06-12 MED ORDER — IOPAMIDOL (ISOVUE-370) INJECTION 76%
INTRAVENOUS | Status: AC
  Administered 2016-06-12: 100 mL
  Filled 2016-06-12: qty 100

## 2016-06-12 NOTE — Telephone Encounter (Signed)
Patient's daughter called to review CT results with me. I reviewed the results and read them aloud to her. Her main question was whether or not this was an urgent issue to evaluate, in regards to the pulmonary nodule that was found. I explained to her that the recommendation based on the radiologist's report was to re-evaluate in 6-12 months. I directed patient to Dr. Anitra Lauth if she had any further questions. Patient's current my chart status is pending. I will mail results of radiology report to patient, so she can happen. Daughter denied any further questions.  Inda Coke PA-C

## 2016-06-12 NOTE — Addendum Note (Signed)
Addended by: Erlene Quan on: 06/12/2016 12:09 PM   Modules accepted: Orders

## 2016-06-12 NOTE — Telephone Encounter (Signed)
Please call patient and see how her appointment went yesterday. Is she back on her ASA 2 x 325mg  daily? Did they talk about any concerns for blood clots?  I still recommend CT angio. Is this something that we could order today for the patient, this would rule out a PE. Unfortunately, a PE could be fatal, which is why I want to be thorough and exclude this from her diagnosis.   Inda Coke PA-C 06/12/16

## 2016-06-12 NOTE — Telephone Encounter (Signed)
Pt's granddaughter Kathycalled back, told her was following up on visit with Surgeon and wanted to know if back on Aspirin 325 mg 2 daily? Juliann Pulse said yes, discussed with surgeon and put back on Aspirin and he felt there was not a concern of PE since only off 4 days. Juliann Pulse said if Aldona Bar still feels need to be done she is okay. Told her Aldona Bar still recommends CT angio. Is this something that we could order today for the patient, this would rule out a PE, which is why I want to be thorough and exclude this from her diagnosis. Juliann Pulse said yes, that is fine need to have done today or tomororow due to going out of town on Sunday. Told her okay, someone will contact her with appt time. Juliann Pulse verbalized understanding.

## 2016-06-12 NOTE — Telephone Encounter (Signed)
Left message on voicemail to call office following up on appt yesterday with surgeon.

## 2016-06-16 ENCOUNTER — Encounter: Payer: Self-pay | Admitting: Family Medicine

## 2016-06-16 DIAGNOSIS — I447 Left bundle-branch block, unspecified: Secondary | ICD-10-CM | POA: Diagnosis not present

## 2016-06-16 DIAGNOSIS — I10 Essential (primary) hypertension: Secondary | ICD-10-CM | POA: Diagnosis not present

## 2016-06-16 DIAGNOSIS — K5900 Constipation, unspecified: Secondary | ICD-10-CM | POA: Diagnosis not present

## 2016-06-16 DIAGNOSIS — K219 Gastro-esophageal reflux disease without esophagitis: Secondary | ICD-10-CM | POA: Diagnosis not present

## 2016-06-16 DIAGNOSIS — Z96651 Presence of right artificial knee joint: Secondary | ICD-10-CM | POA: Diagnosis not present

## 2016-06-16 DIAGNOSIS — I428 Other cardiomyopathies: Secondary | ICD-10-CM | POA: Diagnosis not present

## 2016-06-16 DIAGNOSIS — Z471 Aftercare following joint replacement surgery: Secondary | ICD-10-CM | POA: Diagnosis not present

## 2016-06-16 DIAGNOSIS — Z7982 Long term (current) use of aspirin: Secondary | ICD-10-CM | POA: Diagnosis not present

## 2016-06-17 DIAGNOSIS — I428 Other cardiomyopathies: Secondary | ICD-10-CM | POA: Diagnosis not present

## 2016-06-17 DIAGNOSIS — I10 Essential (primary) hypertension: Secondary | ICD-10-CM | POA: Diagnosis not present

## 2016-06-17 DIAGNOSIS — Z7982 Long term (current) use of aspirin: Secondary | ICD-10-CM | POA: Diagnosis not present

## 2016-06-17 DIAGNOSIS — I447 Left bundle-branch block, unspecified: Secondary | ICD-10-CM | POA: Diagnosis not present

## 2016-06-17 DIAGNOSIS — Z471 Aftercare following joint replacement surgery: Secondary | ICD-10-CM | POA: Diagnosis not present

## 2016-06-17 DIAGNOSIS — K219 Gastro-esophageal reflux disease without esophagitis: Secondary | ICD-10-CM | POA: Diagnosis not present

## 2016-06-17 DIAGNOSIS — Z96651 Presence of right artificial knee joint: Secondary | ICD-10-CM | POA: Diagnosis not present

## 2016-06-17 DIAGNOSIS — K5900 Constipation, unspecified: Secondary | ICD-10-CM | POA: Diagnosis not present

## 2016-06-18 DIAGNOSIS — K5900 Constipation, unspecified: Secondary | ICD-10-CM | POA: Diagnosis not present

## 2016-06-18 DIAGNOSIS — I10 Essential (primary) hypertension: Secondary | ICD-10-CM | POA: Diagnosis not present

## 2016-06-18 DIAGNOSIS — Z96651 Presence of right artificial knee joint: Secondary | ICD-10-CM | POA: Diagnosis not present

## 2016-06-18 DIAGNOSIS — I428 Other cardiomyopathies: Secondary | ICD-10-CM | POA: Diagnosis not present

## 2016-06-18 DIAGNOSIS — Z471 Aftercare following joint replacement surgery: Secondary | ICD-10-CM | POA: Diagnosis not present

## 2016-06-18 DIAGNOSIS — K219 Gastro-esophageal reflux disease without esophagitis: Secondary | ICD-10-CM | POA: Diagnosis not present

## 2016-06-18 DIAGNOSIS — I447 Left bundle-branch block, unspecified: Secondary | ICD-10-CM | POA: Diagnosis not present

## 2016-06-18 DIAGNOSIS — Z7982 Long term (current) use of aspirin: Secondary | ICD-10-CM | POA: Diagnosis not present

## 2016-06-19 DIAGNOSIS — I447 Left bundle-branch block, unspecified: Secondary | ICD-10-CM | POA: Diagnosis not present

## 2016-06-19 DIAGNOSIS — K219 Gastro-esophageal reflux disease without esophagitis: Secondary | ICD-10-CM | POA: Diagnosis not present

## 2016-06-19 DIAGNOSIS — K5900 Constipation, unspecified: Secondary | ICD-10-CM | POA: Diagnosis not present

## 2016-06-19 DIAGNOSIS — I428 Other cardiomyopathies: Secondary | ICD-10-CM | POA: Diagnosis not present

## 2016-06-19 DIAGNOSIS — Z471 Aftercare following joint replacement surgery: Secondary | ICD-10-CM | POA: Diagnosis not present

## 2016-06-19 DIAGNOSIS — Z96651 Presence of right artificial knee joint: Secondary | ICD-10-CM | POA: Diagnosis not present

## 2016-06-19 DIAGNOSIS — Z7982 Long term (current) use of aspirin: Secondary | ICD-10-CM | POA: Diagnosis not present

## 2016-06-19 DIAGNOSIS — I10 Essential (primary) hypertension: Secondary | ICD-10-CM | POA: Diagnosis not present

## 2016-06-23 DIAGNOSIS — I10 Essential (primary) hypertension: Secondary | ICD-10-CM | POA: Diagnosis not present

## 2016-06-23 DIAGNOSIS — Z471 Aftercare following joint replacement surgery: Secondary | ICD-10-CM | POA: Diagnosis not present

## 2016-06-23 DIAGNOSIS — I447 Left bundle-branch block, unspecified: Secondary | ICD-10-CM | POA: Diagnosis not present

## 2016-06-23 DIAGNOSIS — Z7982 Long term (current) use of aspirin: Secondary | ICD-10-CM | POA: Diagnosis not present

## 2016-06-23 DIAGNOSIS — K5900 Constipation, unspecified: Secondary | ICD-10-CM | POA: Diagnosis not present

## 2016-06-23 DIAGNOSIS — I428 Other cardiomyopathies: Secondary | ICD-10-CM | POA: Diagnosis not present

## 2016-06-23 DIAGNOSIS — Z96651 Presence of right artificial knee joint: Secondary | ICD-10-CM | POA: Diagnosis not present

## 2016-06-23 DIAGNOSIS — K219 Gastro-esophageal reflux disease without esophagitis: Secondary | ICD-10-CM | POA: Diagnosis not present

## 2016-06-24 DIAGNOSIS — M25561 Pain in right knee: Secondary | ICD-10-CM | POA: Diagnosis not present

## 2016-06-25 DIAGNOSIS — Z96651 Presence of right artificial knee joint: Secondary | ICD-10-CM | POA: Diagnosis not present

## 2016-06-25 DIAGNOSIS — K5900 Constipation, unspecified: Secondary | ICD-10-CM | POA: Diagnosis not present

## 2016-06-25 DIAGNOSIS — Z7982 Long term (current) use of aspirin: Secondary | ICD-10-CM | POA: Diagnosis not present

## 2016-06-25 DIAGNOSIS — K219 Gastro-esophageal reflux disease without esophagitis: Secondary | ICD-10-CM | POA: Diagnosis not present

## 2016-06-25 DIAGNOSIS — Z471 Aftercare following joint replacement surgery: Secondary | ICD-10-CM | POA: Diagnosis not present

## 2016-06-25 DIAGNOSIS — I447 Left bundle-branch block, unspecified: Secondary | ICD-10-CM | POA: Diagnosis not present

## 2016-06-25 DIAGNOSIS — I10 Essential (primary) hypertension: Secondary | ICD-10-CM | POA: Diagnosis not present

## 2016-06-25 DIAGNOSIS — I428 Other cardiomyopathies: Secondary | ICD-10-CM | POA: Diagnosis not present

## 2016-06-26 ENCOUNTER — Telehealth: Payer: Self-pay | Admitting: *Deleted

## 2016-06-26 DIAGNOSIS — I10 Essential (primary) hypertension: Secondary | ICD-10-CM | POA: Diagnosis not present

## 2016-06-26 DIAGNOSIS — K5900 Constipation, unspecified: Secondary | ICD-10-CM | POA: Diagnosis not present

## 2016-06-26 DIAGNOSIS — Z96651 Presence of right artificial knee joint: Secondary | ICD-10-CM | POA: Diagnosis not present

## 2016-06-26 DIAGNOSIS — Z471 Aftercare following joint replacement surgery: Secondary | ICD-10-CM | POA: Diagnosis not present

## 2016-06-26 DIAGNOSIS — I447 Left bundle-branch block, unspecified: Secondary | ICD-10-CM | POA: Diagnosis not present

## 2016-06-26 DIAGNOSIS — Z7982 Long term (current) use of aspirin: Secondary | ICD-10-CM | POA: Diagnosis not present

## 2016-06-26 DIAGNOSIS — K219 Gastro-esophageal reflux disease without esophagitis: Secondary | ICD-10-CM | POA: Diagnosis not present

## 2016-06-26 DIAGNOSIS — I428 Other cardiomyopathies: Secondary | ICD-10-CM | POA: Diagnosis not present

## 2016-06-26 NOTE — Telephone Encounter (Signed)
Pls inform pt/daughter that I reviewed her CT scan. The nodule is 8 mm (a little bit bigger than a green pea). It had no features suggestive of cancer.  However, as per radiology guidelines for pulmonary nodules like this, a repeat CT chest in 6 months is recommended in order to see if there has been any significant change in the nodule.  Nodules like hers are a very common finding on chest CT scans and very rarely turn out to be anything clinically relevant or cancerous. Reassure them that this has nothing to do with her cough.--thx

## 2016-06-26 NOTE — Telephone Encounter (Signed)
Pts granddaughter Maurice March on 06/26/16 at 9:56am stating that pt had a CT scan done which showed a nodule on pts lower lung. She stated that she is concerned about the because pt has had a cough for several months. She is wanting to what they need to do? Please advise. Thanks.

## 2016-06-26 NOTE — Telephone Encounter (Signed)
Pts daughter advised and voiced understanding.  

## 2016-06-26 NOTE — Telephone Encounter (Signed)
No DPR to release PHI to granddaughter Erasmo Downer. We do have a DPR for pts daughter Juliann Pulse. I left message on Kathy's cell to call back.

## 2016-06-27 DIAGNOSIS — Z96651 Presence of right artificial knee joint: Secondary | ICD-10-CM | POA: Diagnosis not present

## 2016-07-01 DIAGNOSIS — Z96651 Presence of right artificial knee joint: Secondary | ICD-10-CM | POA: Diagnosis not present

## 2016-07-03 DIAGNOSIS — Z96651 Presence of right artificial knee joint: Secondary | ICD-10-CM | POA: Diagnosis not present

## 2016-07-07 DIAGNOSIS — Z96651 Presence of right artificial knee joint: Secondary | ICD-10-CM | POA: Diagnosis not present

## 2016-07-09 DIAGNOSIS — Z96651 Presence of right artificial knee joint: Secondary | ICD-10-CM | POA: Diagnosis not present

## 2016-07-10 DIAGNOSIS — Z96651 Presence of right artificial knee joint: Secondary | ICD-10-CM | POA: Diagnosis not present

## 2016-07-10 DIAGNOSIS — M1712 Unilateral primary osteoarthritis, left knee: Secondary | ICD-10-CM | POA: Diagnosis not present

## 2016-07-10 DIAGNOSIS — Z471 Aftercare following joint replacement surgery: Secondary | ICD-10-CM | POA: Diagnosis not present

## 2016-07-14 DIAGNOSIS — Z96651 Presence of right artificial knee joint: Secondary | ICD-10-CM | POA: Diagnosis not present

## 2016-07-17 DIAGNOSIS — Z96651 Presence of right artificial knee joint: Secondary | ICD-10-CM | POA: Diagnosis not present

## 2016-07-21 DIAGNOSIS — Z96651 Presence of right artificial knee joint: Secondary | ICD-10-CM | POA: Diagnosis not present

## 2016-07-24 DIAGNOSIS — Z96651 Presence of right artificial knee joint: Secondary | ICD-10-CM | POA: Diagnosis not present

## 2016-07-28 DIAGNOSIS — Z96651 Presence of right artificial knee joint: Secondary | ICD-10-CM | POA: Diagnosis not present

## 2016-07-31 DIAGNOSIS — Z96651 Presence of right artificial knee joint: Secondary | ICD-10-CM | POA: Diagnosis not present

## 2016-08-04 DIAGNOSIS — Z96651 Presence of right artificial knee joint: Secondary | ICD-10-CM | POA: Diagnosis not present

## 2016-08-07 DIAGNOSIS — Z96651 Presence of right artificial knee joint: Secondary | ICD-10-CM | POA: Diagnosis not present

## 2016-08-12 DIAGNOSIS — Z96651 Presence of right artificial knee joint: Secondary | ICD-10-CM | POA: Diagnosis not present

## 2016-08-14 DIAGNOSIS — Z96651 Presence of right artificial knee joint: Secondary | ICD-10-CM | POA: Diagnosis not present

## 2016-08-26 ENCOUNTER — Telehealth: Payer: Self-pay | Admitting: Physician Assistant

## 2016-08-26 ENCOUNTER — Telehealth: Payer: Self-pay | Admitting: Family Medicine

## 2016-08-26 NOTE — Telephone Encounter (Signed)
Patient would like to transfer care to [facility] due to location. Out of approval from each provider I will await approval from Dr. Anitra Lauth and one of the [facility] providers to see who would be able to take on the patient before officially transferring. Patient has seen Inda Coke, PA in the past and is scheduled to see her this Thursday for lumps on head.

## 2016-08-26 NOTE — Telephone Encounter (Signed)
Patient is scheduled to see Inda Coke, PA at 8am Thursday 08-28-16 for lumps on skull.

## 2016-08-27 ENCOUNTER — Telehealth: Payer: Self-pay | Admitting: Surgical

## 2016-08-27 NOTE — Telephone Encounter (Signed)
Okay for transfer 

## 2016-08-27 NOTE — Telephone Encounter (Signed)
Transfer is fine with me

## 2016-08-27 NOTE — Telephone Encounter (Signed)
Called and spoke with patients daughter. She stated that patient has not fallen so does not think that lumps are because of that. She stated that it is not bumps either.  She did state that the patient has been having dizzy spells for the last week. She said that it is mainly when the patient stands up. Per daughter she has had no chest pain or shortness of breath. I have advised for them to take BP tonight and in the morning to bring the readings to [facility] at her appointment. I have discussed with Sam.

## 2016-08-28 ENCOUNTER — Ambulatory Visit (INDEPENDENT_AMBULATORY_CARE_PROVIDER_SITE_OTHER): Payer: Medicare Other | Admitting: Physician Assistant

## 2016-08-28 ENCOUNTER — Other Ambulatory Visit: Payer: Medicare Other

## 2016-08-28 ENCOUNTER — Ambulatory Visit: Payer: Self-pay | Admitting: Physician Assistant

## 2016-08-28 ENCOUNTER — Encounter: Payer: Self-pay | Admitting: Physician Assistant

## 2016-08-28 VITALS — BP 130/80 | HR 60 | Temp 98.1°F | Ht 61.0 in | Wt 139.0 lb

## 2016-08-28 DIAGNOSIS — L821 Other seborrheic keratosis: Secondary | ICD-10-CM | POA: Diagnosis not present

## 2016-08-28 DIAGNOSIS — K5900 Constipation, unspecified: Secondary | ICD-10-CM | POA: Diagnosis not present

## 2016-08-28 DIAGNOSIS — M9901 Segmental and somatic dysfunction of cervical region: Secondary | ICD-10-CM | POA: Diagnosis not present

## 2016-08-28 DIAGNOSIS — R42 Dizziness and giddiness: Secondary | ICD-10-CM

## 2016-08-28 DIAGNOSIS — M5011 Cervical disc disorder with radiculopathy,  high cervical region: Secondary | ICD-10-CM | POA: Diagnosis not present

## 2016-08-28 LAB — POCT URINALYSIS DIPSTICK
Blood, UA: NEGATIVE
GLUCOSE UA: NEGATIVE
Ketones, UA: 5
NITRITE UA: NEGATIVE
Spec Grav, UA: >=1.03 — AB (ref 1.010–1.025)
Urobilinogen, UA: 1 E.U./dL
pH, UA: 6 (ref 5.0–8.0)

## 2016-08-28 LAB — CBC WITH DIFFERENTIAL/PLATELET
BASOS ABS: 0 10*3/uL (ref 0.0–0.1)
Basophils Relative: 0.5 % (ref 0.0–3.0)
Eosinophils Absolute: 0.2 10*3/uL (ref 0.0–0.7)
Eosinophils Relative: 2.1 % (ref 0.0–5.0)
HCT: 43.2 % (ref 36.0–46.0)
Hemoglobin: 14.5 g/dL (ref 12.0–15.0)
LYMPHS ABS: 1.5 10*3/uL (ref 0.7–4.0)
Lymphocytes Relative: 18.1 % (ref 12.0–46.0)
MCHC: 33.5 g/dL (ref 30.0–36.0)
MCV: 88.3 fl (ref 78.0–100.0)
MONO ABS: 0.5 10*3/uL (ref 0.1–1.0)
MONOS PCT: 6.1 % (ref 3.0–12.0)
NEUTROS ABS: 6.1 10*3/uL (ref 1.4–7.7)
NEUTROS PCT: 73.2 % (ref 43.0–77.0)
PLATELETS: 308 10*3/uL (ref 150.0–400.0)
RBC: 4.89 Mil/uL (ref 3.87–5.11)
RDW: 13.8 % (ref 11.5–15.5)
WBC: 8.4 10*3/uL (ref 4.0–10.5)

## 2016-08-28 LAB — COMPREHENSIVE METABOLIC PANEL
ALT: 9 U/L (ref 0–35)
AST: 15 U/L (ref 0–37)
Albumin: 4.1 g/dL (ref 3.5–5.2)
Alkaline Phosphatase: 45 U/L (ref 39–117)
BUN: 8 mg/dL (ref 6–23)
CO2: 29 meq/L (ref 19–32)
Calcium: 9.9 mg/dL (ref 8.4–10.5)
Chloride: 105 mEq/L (ref 96–112)
Creatinine, Ser: 0.92 mg/dL (ref 0.40–1.20)
GFR: 61.56 mL/min (ref >60.00)
GLUCOSE: 121 mg/dL — AB (ref 70–99)
POTASSIUM: 3.9 meq/L (ref 3.5–5.1)
Sodium: 142 mEq/L (ref 135–145)
Total Bilirubin: 0.5 mg/dL (ref 0.2–1.2)
Total Protein: 7.2 g/dL (ref 6.0–8.3)

## 2016-08-28 NOTE — Patient Instructions (Addendum)
It was great seeing you!  Please see your chiropractor as we discussed, if this does not help with your symptoms, please let us know.  Start Miralax as we discussed, and try to consume more high fiber foods -- fruits, vegetables, beans, whole grain foods.  Stay hydrated! Before drinking anything other than water, push yourself to have an entire glass of water first!  Please make an appointment for your annual wellness visit with Cassie here in our office and then your physical with Dr. [name].  If your dizziness changes in any way, please seek medical attention immediately.

## 2016-08-28 NOTE — Telephone Encounter (Signed)
Noted. Discussed with Shaune Pascal as documented.  Inda Coke PA-C

## 2016-08-28 NOTE — Addendum Note (Signed)
Addended by: Frutoso Chase A on: 08/28/2016 11:34 AM   Modules accepted: Orders

## 2016-08-28 NOTE — Progress Notes (Signed)
Madison White is a 83 y.o. female here dizziness and knot on right side of skull.  I acted as a Education administrator for [employer], PA-C Anselmo Pickler, LPN  History of Present Illness:   Chief Complaint  Patient presents with  . Dizziness  . Cyst    right side of skull   Granddaughter Madison White present and supplementing history.   Dizziness  This is a new problem. The current episode started in the past 7 days. The problem occurs daily. The problem has been gradually worsening. Associated symptoms include a change in bowel habit, congestion and headaches. Pertinent negatives include no abdominal pain, nausea, neck pain, numbness, rash, sore throat, urinary symptoms or weakness. Associated symptoms comments: Right side of skull, sharp stabbing pain off and on.. The symptoms are aggravated by bending, twisting and walking. She has tried acetaminophen for the symptoms. The treatment provided mild (uses her walker at night to go to the bathroom to make sure she doesn't fall) relief.  Constipation  This is a chronic problem. The problem has been waxing and waning since onset. Her stool frequency is 2 to 3 times per week. Stool description: extremely hard. The patient is not on a high fiber diet. She does not exercise regularly. There has not been adequate water intake. Associated symptoms include bloating and flatus. Pertinent negatives include no abdominal pain, hemorrhoids, nausea or rectal pain. She has tried stool softeners for the symptoms. The treatment provided mild relief.  Mole Lumps in skull, saw a dermatologist in November and the mole was determined to not be cancerous. Patient wants reassurance, feels like it has moved, which granddaughter denies. Denies fevers, chills, excessive sun exposure, or discharge from area.  PMHx, SurgHx, SocialHx, Medications, and Allergies were reviewed in the Visit Navigator and updated as appropriate.  Current Medications:   Current Outpatient Prescriptions:   .  acetaminophen (TYLENOL) 500 MG tablet, Take 1,000 mg by mouth., Disp: , Rfl:  .  aspirin 81 MG tablet, Take 1 tablet (81 mg total) by mouth daily., Disp: 30 tablet, Rfl: 1 .  Calcium Carbonate-Vitamin D (CALCIUM 600 + D PO), Take 1 tablet by mouth daily., Disp: , Rfl:  .  Cyanocobalamin (VITAMIN B-12 CR PO), Take by mouth., Disp: , Rfl:  .  fish oil-omega-3 fatty acids 1000 MG capsule, Take 2 g by mouth daily., Disp: , Rfl:  .  ibuprofen (ADVIL,MOTRIN) 200 MG tablet, Take 200 mg by mouth 2 (two) times daily., Disp: , Rfl:  .  Multiple Vitamins-Minerals (CENTRUM PO), Take by mouth., Disp: , Rfl:  .  vitamin C (ASCORBIC ACID) 500 MG tablet, Take 500 mg by mouth., Disp: , Rfl:  .  vitamin E 1000 UNIT capsule, Take 1,000 Units by mouth daily., Disp: , Rfl:  .  fexofenadine (ALLEGRA) 180 MG tablet, Take 180 mg by mouth daily., Disp: , Rfl:    Review of Systems:   Review of Systems  HENT: Positive for congestion. Negative for sore throat.   Gastrointestinal: Positive for bloating, change in bowel habit, constipation and flatus. Negative for abdominal pain, hemorrhoids, nausea and rectal pain.  Musculoskeletal: Negative for neck pain.  Skin: Negative for rash.  Neurological: Positive for dizziness and headaches. Negative for weakness and numbness.    Vitals:   Vitals:   08/28/16 0814  BP: 130/80  Pulse: 60  Temp: 98.1 F (36.7 C)  TempSrc: Oral  SpO2: 98%  Weight: 139 lb (63 kg)  Height: 5\' 1"  (1.549 m)  Body mass index is 26.26 kg/m.  Physical Exam:   Physical Exam  Constitutional: She appears well-developed. She is cooperative.  Non-toxic appearance. She does not have a sickly appearance. She does not appear ill. No distress.  Cardiovascular: Normal rate, regular rhythm, S1 normal, S2 normal, normal heart sounds and normal pulses.   No LE edema  Pulmonary/Chest: Effort normal and breath sounds normal.  Neurological: She is alert. She has normal strength. No cranial  nerve deficit or sensory deficit. Coordination and gait normal. GCS eye subscore is 4. GCS verbal subscore is 5. GCS motor subscore is 6.  Skin:  1 cm round raised lesion with dull, verrucous surface located at R temporal scalp  Nursing note and vitals reviewed.   Results for orders placed or performed in visit on 08/28/16  POCT urinalysis dipstick  Result Value Ref Range   Color, UA Yellow    Clarity, UA Hazy    Glucose, UA Negative    Bilirubin, UA 1+    Ketones, UA 5.0    Spec Grav, UA >=1.030 (A) 1.010 - 1.025   Blood, UA Negative    pH, UA 6.0 5.0 - 8.0   Protein, UA 2+    Urobilinogen, UA 1.0 0.2 or 1.0 E.U./dL   Nitrite, UA Negative    Leukocytes, UA Moderate (2+) (A) Negative    Assessment and Plan:    Staphanie was seen today for dizziness and cyst.  Diagnoses and all orders for this visit:  Dizziness Exam benign. Orthostatics negative, however patient is symptomatic when changing positions. Patient and granddaughter report that she drinks very little liquids throughout the day, and of her liquids, she rarely drinks water. Denies any exertional symptoms such as chest pain, SOB, LOC. Will order routine labs. Urine to be sent for culture. UA shows dehydration. Follow-up if worsening symptoms or symptoms persist despite treatment. -     CBC with Differential/Platelet -     Comprehensive metabolic panel -     POCT urinalysis dipstick -     Urine Culture; Future  Seborrheic keratosis Provided reassurance for patient, however I recommended that if she wanted further reassurance to return to her dermatologist for another evaluation.   Constipation, unspecified constipation type Discussed importance of fluid intake, high fiber foods and continued exercise (walking) as able. We also discussed using Miralax. Patient verbalized understanding. Consider fiber supplements as well.  . Reviewed expectations re: course of current medical issues. . Discussed self-management of  symptoms. . Outlined signs and symptoms indicating need for more acute intervention. . Patient verbalized understanding and all questions were answered. . See orders for this visit as documented in the electronic medical record. . Patient received an After-Visit Summary.  CMA or LPN served as scribe during this visit. History, Physical, and Plan performed by medical provider. Documentation and orders reviewed and attested to.  Inda Coke, PA-C

## 2016-08-29 LAB — URINE CULTURE

## 2016-09-04 DIAGNOSIS — M1712 Unilateral primary osteoarthritis, left knee: Secondary | ICD-10-CM | POA: Diagnosis not present

## 2016-09-04 DIAGNOSIS — Z471 Aftercare following joint replacement surgery: Secondary | ICD-10-CM | POA: Diagnosis not present

## 2016-09-04 DIAGNOSIS — Z01818 Encounter for other preprocedural examination: Secondary | ICD-10-CM | POA: Diagnosis not present

## 2016-09-04 DIAGNOSIS — Z96651 Presence of right artificial knee joint: Secondary | ICD-10-CM | POA: Diagnosis not present

## 2016-09-04 DIAGNOSIS — M170 Bilateral primary osteoarthritis of knee: Secondary | ICD-10-CM | POA: Diagnosis not present

## 2016-09-18 DIAGNOSIS — Z96651 Presence of right artificial knee joint: Secondary | ICD-10-CM | POA: Diagnosis not present

## 2016-09-18 DIAGNOSIS — R29898 Other symptoms and signs involving the musculoskeletal system: Secondary | ICD-10-CM | POA: Diagnosis not present

## 2016-09-18 DIAGNOSIS — M25561 Pain in right knee: Secondary | ICD-10-CM | POA: Diagnosis not present

## 2016-09-18 DIAGNOSIS — M1712 Unilateral primary osteoarthritis, left knee: Secondary | ICD-10-CM | POA: Diagnosis not present

## 2016-09-18 DIAGNOSIS — M25669 Stiffness of unspecified knee, not elsewhere classified: Secondary | ICD-10-CM | POA: Diagnosis not present

## 2016-09-23 DIAGNOSIS — Z96652 Presence of left artificial knee joint: Secondary | ICD-10-CM | POA: Diagnosis not present

## 2016-09-23 DIAGNOSIS — Z91048 Other nonmedicinal substance allergy status: Secondary | ICD-10-CM | POA: Diagnosis not present

## 2016-09-23 DIAGNOSIS — R2689 Other abnormalities of gait and mobility: Secondary | ICD-10-CM | POA: Diagnosis not present

## 2016-09-23 DIAGNOSIS — Z882 Allergy status to sulfonamides status: Secondary | ICD-10-CM | POA: Diagnosis not present

## 2016-09-23 DIAGNOSIS — M1712 Unilateral primary osteoarthritis, left knee: Secondary | ICD-10-CM | POA: Diagnosis not present

## 2016-09-23 DIAGNOSIS — Z9842 Cataract extraction status, left eye: Secondary | ICD-10-CM | POA: Diagnosis not present

## 2016-09-23 DIAGNOSIS — Z79899 Other long term (current) drug therapy: Secondary | ICD-10-CM | POA: Diagnosis not present

## 2016-09-23 DIAGNOSIS — M170 Bilateral primary osteoarthritis of knee: Secondary | ICD-10-CM | POA: Diagnosis not present

## 2016-09-23 DIAGNOSIS — M6281 Muscle weakness (generalized): Secondary | ICD-10-CM | POA: Diagnosis not present

## 2016-09-23 DIAGNOSIS — Z471 Aftercare following joint replacement surgery: Secondary | ICD-10-CM | POA: Diagnosis not present

## 2016-09-23 DIAGNOSIS — K219 Gastro-esophageal reflux disease without esophagitis: Secondary | ICD-10-CM | POA: Diagnosis not present

## 2016-09-23 DIAGNOSIS — Z87891 Personal history of nicotine dependence: Secondary | ICD-10-CM | POA: Diagnosis not present

## 2016-09-23 DIAGNOSIS — Z9049 Acquired absence of other specified parts of digestive tract: Secondary | ICD-10-CM | POA: Diagnosis not present

## 2016-09-23 DIAGNOSIS — Z8249 Family history of ischemic heart disease and other diseases of the circulatory system: Secondary | ICD-10-CM | POA: Diagnosis not present

## 2016-09-23 DIAGNOSIS — I119 Hypertensive heart disease without heart failure: Secondary | ICD-10-CM | POA: Diagnosis not present

## 2016-09-23 DIAGNOSIS — Z9841 Cataract extraction status, right eye: Secondary | ICD-10-CM | POA: Diagnosis not present

## 2016-09-23 DIAGNOSIS — Z91040 Latex allergy status: Secondary | ICD-10-CM | POA: Diagnosis not present

## 2016-09-23 DIAGNOSIS — M25562 Pain in left knee: Secondary | ICD-10-CM | POA: Diagnosis not present

## 2016-09-23 DIAGNOSIS — I10 Essential (primary) hypertension: Secondary | ICD-10-CM | POA: Diagnosis not present

## 2016-09-23 DIAGNOSIS — Z7982 Long term (current) use of aspirin: Secondary | ICD-10-CM | POA: Diagnosis not present

## 2016-09-23 DIAGNOSIS — Z880 Allergy status to penicillin: Secondary | ICD-10-CM | POA: Diagnosis not present

## 2016-09-23 DIAGNOSIS — I499 Cardiac arrhythmia, unspecified: Secondary | ICD-10-CM | POA: Diagnosis not present

## 2016-09-23 DIAGNOSIS — I429 Cardiomyopathy, unspecified: Secondary | ICD-10-CM | POA: Diagnosis not present

## 2016-09-23 DIAGNOSIS — Z809 Family history of malignant neoplasm, unspecified: Secondary | ICD-10-CM | POA: Diagnosis not present

## 2016-09-23 DIAGNOSIS — G8918 Other acute postprocedural pain: Secondary | ICD-10-CM | POA: Diagnosis not present

## 2016-09-23 DIAGNOSIS — Z833 Family history of diabetes mellitus: Secondary | ICD-10-CM | POA: Diagnosis not present

## 2016-09-23 DIAGNOSIS — I447 Left bundle-branch block, unspecified: Secondary | ICD-10-CM | POA: Diagnosis not present

## 2016-09-23 DIAGNOSIS — Z96651 Presence of right artificial knee joint: Secondary | ICD-10-CM | POA: Diagnosis not present

## 2016-09-25 DIAGNOSIS — M170 Bilateral primary osteoarthritis of knee: Secondary | ICD-10-CM | POA: Diagnosis not present

## 2016-09-25 DIAGNOSIS — M1990 Unspecified osteoarthritis, unspecified site: Secondary | ICD-10-CM | POA: Diagnosis not present

## 2016-09-25 DIAGNOSIS — I10 Essential (primary) hypertension: Secondary | ICD-10-CM | POA: Diagnosis not present

## 2016-09-25 DIAGNOSIS — D72829 Elevated white blood cell count, unspecified: Secondary | ICD-10-CM | POA: Diagnosis not present

## 2016-09-25 DIAGNOSIS — Z96652 Presence of left artificial knee joint: Secondary | ICD-10-CM | POA: Diagnosis not present

## 2016-09-25 DIAGNOSIS — R2689 Other abnormalities of gait and mobility: Secondary | ICD-10-CM | POA: Diagnosis not present

## 2016-09-25 DIAGNOSIS — R269 Unspecified abnormalities of gait and mobility: Secondary | ICD-10-CM | POA: Diagnosis not present

## 2016-09-25 DIAGNOSIS — R600 Localized edema: Secondary | ICD-10-CM | POA: Diagnosis not present

## 2016-09-25 DIAGNOSIS — G8918 Other acute postprocedural pain: Secondary | ICD-10-CM | POA: Diagnosis not present

## 2016-09-25 DIAGNOSIS — Z471 Aftercare following joint replacement surgery: Secondary | ICD-10-CM | POA: Diagnosis not present

## 2016-09-25 DIAGNOSIS — M6281 Muscle weakness (generalized): Secondary | ICD-10-CM | POA: Diagnosis not present

## 2016-09-25 DIAGNOSIS — M1712 Unilateral primary osteoarthritis, left knee: Secondary | ICD-10-CM | POA: Diagnosis not present

## 2016-09-25 DIAGNOSIS — G8929 Other chronic pain: Secondary | ICD-10-CM | POA: Diagnosis not present

## 2016-09-25 DIAGNOSIS — R531 Weakness: Secondary | ICD-10-CM | POA: Diagnosis not present

## 2016-09-25 DIAGNOSIS — K219 Gastro-esophageal reflux disease without esophagitis: Secondary | ICD-10-CM | POA: Diagnosis not present

## 2016-09-25 DIAGNOSIS — Z741 Need for assistance with personal care: Secondary | ICD-10-CM | POA: Diagnosis not present

## 2016-09-25 DIAGNOSIS — R7989 Other specified abnormal findings of blood chemistry: Secondary | ICD-10-CM | POA: Diagnosis not present

## 2016-09-25 DIAGNOSIS — I429 Cardiomyopathy, unspecified: Secondary | ICD-10-CM | POA: Diagnosis not present

## 2016-09-25 DIAGNOSIS — I447 Left bundle-branch block, unspecified: Secondary | ICD-10-CM | POA: Diagnosis not present

## 2016-09-25 DIAGNOSIS — M25562 Pain in left knee: Secondary | ICD-10-CM | POA: Diagnosis not present

## 2016-09-25 DIAGNOSIS — Z7982 Long term (current) use of aspirin: Secondary | ICD-10-CM | POA: Diagnosis not present

## 2016-09-26 DIAGNOSIS — R600 Localized edema: Secondary | ICD-10-CM | POA: Diagnosis not present

## 2016-09-26 DIAGNOSIS — Z471 Aftercare following joint replacement surgery: Secondary | ICD-10-CM | POA: Diagnosis not present

## 2016-09-29 DIAGNOSIS — Z471 Aftercare following joint replacement surgery: Secondary | ICD-10-CM | POA: Diagnosis not present

## 2016-09-29 DIAGNOSIS — D72829 Elevated white blood cell count, unspecified: Secondary | ICD-10-CM | POA: Diagnosis not present

## 2016-09-30 DIAGNOSIS — R269 Unspecified abnormalities of gait and mobility: Secondary | ICD-10-CM | POA: Diagnosis not present

## 2016-09-30 DIAGNOSIS — Z741 Need for assistance with personal care: Secondary | ICD-10-CM | POA: Diagnosis not present

## 2016-09-30 LAB — HEPATIC FUNCTION PANEL
ALK PHOS: 51 (ref 25–125)
ALT: 12 (ref 7–35)
AST: 15 (ref 13–35)
BILIRUBIN, TOTAL: 0.7

## 2016-09-30 LAB — BASIC METABOLIC PANEL
BUN: 13 (ref 4–21)
Creatinine: 0.6 (ref 0.5–1.1)
Glucose: 102
Potassium: 4.1 (ref 3.4–5.3)
SODIUM: 141 (ref 137–147)

## 2016-09-30 LAB — CBC AND DIFFERENTIAL
HEMATOCRIT: 40 (ref 36–46)
HEMOGLOBIN: 13.1 (ref 12.0–16.0)
Platelets: 288 (ref 150–399)
WBC: 10.1

## 2016-10-01 DIAGNOSIS — I10 Essential (primary) hypertension: Secondary | ICD-10-CM | POA: Diagnosis not present

## 2016-10-01 DIAGNOSIS — M1990 Unspecified osteoarthritis, unspecified site: Secondary | ICD-10-CM | POA: Diagnosis not present

## 2016-10-01 DIAGNOSIS — R7989 Other specified abnormal findings of blood chemistry: Secondary | ICD-10-CM | POA: Diagnosis not present

## 2016-10-03 DIAGNOSIS — M1990 Unspecified osteoarthritis, unspecified site: Secondary | ICD-10-CM | POA: Diagnosis not present

## 2016-10-03 DIAGNOSIS — R531 Weakness: Secondary | ICD-10-CM | POA: Diagnosis not present

## 2016-10-06 DIAGNOSIS — M1712 Unilateral primary osteoarthritis, left knee: Secondary | ICD-10-CM | POA: Diagnosis not present

## 2016-10-06 DIAGNOSIS — G8929 Other chronic pain: Secondary | ICD-10-CM | POA: Diagnosis not present

## 2016-10-07 DIAGNOSIS — Z96652 Presence of left artificial knee joint: Secondary | ICD-10-CM | POA: Diagnosis not present

## 2016-10-07 DIAGNOSIS — Z471 Aftercare following joint replacement surgery: Secondary | ICD-10-CM | POA: Diagnosis not present

## 2016-10-13 DIAGNOSIS — M1712 Unilateral primary osteoarthritis, left knee: Secondary | ICD-10-CM | POA: Diagnosis not present

## 2016-10-13 DIAGNOSIS — G8929 Other chronic pain: Secondary | ICD-10-CM | POA: Diagnosis not present

## 2016-10-14 DIAGNOSIS — R531 Weakness: Secondary | ICD-10-CM | POA: Diagnosis not present

## 2016-10-14 DIAGNOSIS — M1990 Unspecified osteoarthritis, unspecified site: Secondary | ICD-10-CM | POA: Diagnosis not present

## 2016-10-20 DIAGNOSIS — M1712 Unilateral primary osteoarthritis, left knee: Secondary | ICD-10-CM | POA: Diagnosis not present

## 2016-10-23 DIAGNOSIS — M1712 Unilateral primary osteoarthritis, left knee: Secondary | ICD-10-CM | POA: Diagnosis not present

## 2016-10-27 DIAGNOSIS — M1712 Unilateral primary osteoarthritis, left knee: Secondary | ICD-10-CM | POA: Diagnosis not present

## 2016-10-30 DIAGNOSIS — M1712 Unilateral primary osteoarthritis, left knee: Secondary | ICD-10-CM | POA: Diagnosis not present

## 2016-11-03 DIAGNOSIS — M1712 Unilateral primary osteoarthritis, left knee: Secondary | ICD-10-CM | POA: Diagnosis not present

## 2016-11-06 DIAGNOSIS — Z96652 Presence of left artificial knee joint: Secondary | ICD-10-CM | POA: Diagnosis not present

## 2016-11-06 DIAGNOSIS — Z96653 Presence of artificial knee joint, bilateral: Secondary | ICD-10-CM | POA: Insufficient documentation

## 2016-11-06 DIAGNOSIS — M1712 Unilateral primary osteoarthritis, left knee: Secondary | ICD-10-CM | POA: Diagnosis not present

## 2016-11-06 DIAGNOSIS — Z471 Aftercare following joint replacement surgery: Secondary | ICD-10-CM | POA: Diagnosis not present

## 2016-11-10 DIAGNOSIS — M1712 Unilateral primary osteoarthritis, left knee: Secondary | ICD-10-CM | POA: Diagnosis not present

## 2016-11-18 ENCOUNTER — Encounter: Payer: Self-pay | Admitting: Family Medicine

## 2016-11-18 DIAGNOSIS — M1712 Unilateral primary osteoarthritis, left knee: Secondary | ICD-10-CM | POA: Diagnosis not present

## 2016-11-18 LAB — ESTIMATED GFR: GFR CALC NON AF AMER: 83.37

## 2016-11-24 ENCOUNTER — Other Ambulatory Visit: Payer: Self-pay | Admitting: Family Medicine

## 2016-11-24 DIAGNOSIS — I6523 Occlusion and stenosis of bilateral carotid arteries: Secondary | ICD-10-CM

## 2016-11-24 DIAGNOSIS — M1712 Unilateral primary osteoarthritis, left knee: Secondary | ICD-10-CM | POA: Diagnosis not present

## 2016-11-26 DIAGNOSIS — M1712 Unilateral primary osteoarthritis, left knee: Secondary | ICD-10-CM | POA: Diagnosis not present

## 2016-12-10 ENCOUNTER — Ambulatory Visit (HOSPITAL_COMMUNITY)
Admission: RE | Admit: 2016-12-10 | Discharge: 2016-12-10 | Disposition: A | Discharge status: Home / Self Care | Payer: Medicare Other | Source: Ambulatory Visit | Attending: Cardiology | Admitting: Cardiology

## 2016-12-10 ENCOUNTER — Encounter: Payer: Self-pay | Admitting: Family Medicine

## 2016-12-10 DIAGNOSIS — I6523 Occlusion and stenosis of bilateral carotid arteries: Secondary | ICD-10-CM | POA: Diagnosis not present

## 2016-12-18 ENCOUNTER — Ambulatory Visit (INDEPENDENT_AMBULATORY_CARE_PROVIDER_SITE_OTHER): Payer: Medicare Other | Admitting: Family Medicine

## 2016-12-18 ENCOUNTER — Encounter: Payer: Self-pay | Admitting: Family Medicine

## 2016-12-18 VITALS — BP 130/86 | HR 85 | Temp 99.2°F | Ht 61.0 in | Wt 135.2 lb

## 2016-12-18 DIAGNOSIS — R059 Cough, unspecified: Secondary | ICD-10-CM

## 2016-12-18 DIAGNOSIS — R05 Cough: Secondary | ICD-10-CM | POA: Diagnosis not present

## 2016-12-18 MED ORDER — DOXYCYCLINE HYCLATE 100 MG PO TABS: 100.0000 mg | ORAL_TABLET | Freq: Two times a day (BID) | ORAL | 0 refills | Status: DC

## 2016-12-18 MED ORDER — BENZONATATE 100 MG PO CAPS: 100.0000 mg | ORAL_CAPSULE | Freq: Three times a day (TID) | ORAL | 1 refills | Status: DC | PRN

## 2016-12-18 NOTE — Progress Notes (Signed)
Madison White is a 83 y.o. female here for an acute visit.  History of Present Illness:   Shaune Pascal CMA acting as scribe for Dr. Juleen China.  [id number] comes in today for acute visit. She has had a runny nose for couple weeks. She developed a cough a couple days ago. The cough is productive. Temperature is elevated. No CP, SOB, or wheeze. Some right sinus pressure. Nonsmoker.   PMHx, SurgHx, SocialHx, Medications, and Allergies were reviewed in the Visit Navigator and updated as appropriate.  Current Medications:   .  acetaminophen (TYLENOL) 500 MG tablet, Take 1,000 mg by mouth., Disp: , Rfl:  .  aspirin 81 MG tablet, Take 1 tablet (81 mg total) by mouth daily., Disp: 30 tablet, Rfl: 1 .  Calcium Carbonate-Vitamin D (CALCIUM 600 + D PO), Take 1 tablet by mouth daily., Disp: , Rfl:  .  Cyanocobalamin (VITAMIN B-12 CR PO), Take by mouth., Disp: , Rfl:  .  fexofenadine (ALLEGRA) 180 MG tablet, Take 180 mg by mouth daily., Disp: , Rfl:  .  fish oil-omega-3 fatty acids 1000 MG capsule, Take 2 g by mouth daily., Disp: , Rfl:  .  ibuprofen (ADVIL,MOTRIN) 200 MG tablet, Take 200 mg by mouth 2 (two) times daily., Disp: , Rfl:  .  Multiple Vitamins-Minerals (CENTRUM PO), Take by mouth., Disp: , Rfl:  .  vitamin C (ASCORBIC ACID) 500 MG tablet, Take 500 mg by mouth., Disp: , Rfl:  .  vitamin E 1000 UNIT capsule, Take 1,000 Units by mouth daily., Disp: , Rfl:    Allergies  Allergen Reactions  . Avapro [Irbesartan] Other (See Comments)    Unknown rxn  . Lisinopril Swelling    Swelling around eyes; also says it caused increased sugar and BP  . Penicillins Hives  . Simvastatin Other (See Comments)    unknown  . Latex Rash  . Nickel Rash and Swelling  . Sulfa Antibiotics Rash  . Sulfamethoxazole Rash   Review of Systems:   Pertinent items are noted in the HPI. Otherwise, ROS is negative.  Vitals:   Vitals:   12/18/16 1046  BP: 130/86  Pulse: 85  Temp: 99.2 F (37.3 C)    TempSrc: Oral  SpO2: 95%  Weight: 135 lb 3.2 oz (61.3 kg)  Height: 5\' 1"  (1.549 m)     Body mass index is 25.55 kg/m.   Physical Exam:   Physical Exam  Constitutional: She appears well-nourished.  HENT:  Head: Atraumatic.  Nose: Rhinorrhea present.  Eyes: Pupils are equal, round, and reactive to light. EOM are normal.  Neck: Normal range of motion. Neck supple.  Cardiovascular: Normal rate, regular rhythm, normal heart sounds and intact distal pulses.   Pulmonary/Chest: Effort normal. She has no wheezes.  Abdominal: Soft.  Skin: Skin is warm.  Psychiatric: She has a normal mood and affect. Her behavior is normal.  Nursing note and vitals reviewed.  Assessment and Plan:   Barbarajean was seen today for cough.  Diagnoses and all orders for this visit:  Cough -     doxycycline (VIBRA-TABS) 100 MG tablet; Take 1 tablet (100 mg total) by mouth 2 (two) times daily. -     benzonatate (TESSALON PERLES) 100 MG capsule; Take 1 capsule (100 mg total) by mouth 3 (three) times daily as needed for cough.   . Reviewed expectations re: course of current medical issues. . Discussed self-management of symptoms. . Outlined signs and symptoms indicating need for more acute intervention. [name]  Patient verbalized understanding and all questions were answered. [name] Health Maintenance issues including appropriate healthy diet, exercise, and smoking avoidance were discussed with patient. . See orders for this visit as documented in the electronic medical record. . Patient received an After Visit Summary.  CMA served as Education administrator during this visit. History, Physical, and Plan performed by medical provider. The above documentation has been reviewed and is accurate and complete. [name], D.O.  [name], DO [place] [place] 12/18/2016  Future Appointments Date Time Provider [place]  01/07/2017 8:00 AM Stephanie Acre, RN [facility] None  01/07/2017 9:15 AM [name], DO  [facility] None

## 2017-01-07 ENCOUNTER — Ambulatory Visit: Payer: Medicare Other | Admitting: *Deleted

## 2017-01-07 ENCOUNTER — Encounter: Payer: Medicare Other | Admitting: Family Medicine

## 2017-01-07 DIAGNOSIS — Z471 Aftercare following joint replacement surgery: Secondary | ICD-10-CM | POA: Diagnosis not present

## 2017-01-07 DIAGNOSIS — Z96652 Presence of left artificial knee joint: Secondary | ICD-10-CM | POA: Diagnosis not present

## 2017-01-09 ENCOUNTER — Ambulatory Visit: Payer: Self-pay

## 2017-01-12 NOTE — Progress Notes (Signed)
Pre visit review using our clinic review tool, if applicable. No additional management support is needed unless otherwise documented below in the visit note. 

## 2017-01-12 NOTE — Progress Notes (Signed)
PCP notes:   Health maintenance: DEXA: Never received.  Flu: Will receive today with approval from Dr Juleen China.  Abnormal screenings: can only recall year.    Patient concerns: None.   Nurse concerns: Cannot recall year. Granddaughter states pt left the oven on so they cook food and have meals prepared for her.  Next PCP appt: 01/14/17 10:40

## 2017-01-12 NOTE — Progress Notes (Signed)
Subjective:   Madison White is a 83 y.o. female who presents for Medicare Annual (Subsequent) preventive examination.  Lives with daughter in single family home. Lives on first floor.  Review of Systems:  No ROS.  Medicare Wellness Visit. Additional risk factors are reflected in the social history.  Cardiac Risk Factors include: advanced age (>62men, >35 women);dyslipidemia;family history of premature cardiovascular disease     Objective:     Vitals: BP (!) 142/82 (BP Location: Right Arm, Patient Position: Sitting, Cuff Size: Normal)   Pulse 70   Temp 98.1 F (36.7 C) (Oral)   Resp 16   Ht 5\' 1"  (1.549 m)   Wt 139 lb 12.8 oz (63.4 kg)   SpO2 98%   BMI 26.41 kg/m   Body mass index is 26.41 kg/m.   Tobacco History  Smoking Status  . Former Smoker  . Quit date: 05/31/1958  Smokeless Tobacco  . Never Used     Counseling given: Not Answered   Past Medical History:  Diagnosis Date  . Diastolic dysfunction, left ventricle   . GERD (gastroesophageal reflux disease)   . 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  [name] History of blood transfusion   . History of rheumatic fever   . HTN (hypertension)    hx of refusing treatment--White coat HTN and/or situational HTN (?)   . Hyperlipidemia    hx of refusing treatment  . Insulin resistance    A1c's excellent per old records (6.1 in 2008 and 2009)  . Left bundle branch block 12/2011   Dr. Sallyanne Kuster at [facility]; ECHO and myocardial perfusion scan showed  septal and apical wall motion abnormality but she had no significant valvular disease and no ischemia.  She did have mildly decreased EF (39% on lexiscan and 50% on echo) and abnl LV relaxation.  Mild amount of PVCs.  Pt at higher risk for other conduction  abnormalities, good chance of eventually requiring a pacemaker.   [name] LVH (left ventricular hypertrophy)   . Nonischemic cardiomyopathy ([place]) 2016   LV dysfunction due to LBBB/septal dyssynchrony  . Osteoarthritis    bilat knees--needs bilat TKA.  Ortho is trying steroid injections as of 10/23/15.  . Seasonal allergies   . Solitary pulmonary nodule 05/2016   8 mm pleural based nodule in RLL.  Radiology recommended f/u noncontrast CT in 6-12 mo.   Past Surgical History:  Procedure Laterality Date  . ABDOMINAL HYSTERECTOMY  1963   At the time of her last C/S; says she had partial bladder resection at that time as well  . CARDIOVASCULAR STRESS TEST  08/2005 & 12/2011   Low risk scans (on the 12/2011 scan she did have EF 39% with moderately severe LV dysfunction with septal dyssynergy probably contributed by LBBB  . Carotid dopplers  11/22/14; 11/30/16   NORMAL 2016 and 2018  . CATARACT EXTRACTION  08/14/11   both  . CESAREAN SECTION  X 5   One of her neonates died soon after birth  . CHOLECYSTECTOMY     1980's  . JOINT REPLACEMENT Bilateral    knee  . right knee surgery  05/2016   ? R TKA: no records.  [name] STRABISMUS SURGERY  1939  . TONSILLECTOMY  1938  . TRANSTHORACIC ECHOCARDIOGRAM  01/13/12   Septal dyssynergy, EF 50%, LV relaxation impaired.  No significant valvular abnormalities.   Family History  Problem Relation Age of Onset  . Hypertension Mother   . Heart disease Mother   . Hyperlipidemia Mother   . Diabetes Mother   . Heart disease Father   . Cancer Father    History  Sexual Activity  . Sexual activity: Not on file    Outpatient Encounter Prescriptions as of 01/14/2017  Medication Sig  . acetaminophen (TYLENOL) 500 MG tablet Take 1,000 mg by mouth.  [name] aspirin 81 MG tablet Take 1 tablet (81 mg total) by mouth daily. (Patient not taking: Reported on 01/14/2017)  . benzonatate (TESSALON PERLES) 100 MG capsule Take 1 capsule (100 mg total) by mouth 3 (three) times daily as  needed for cough.  . Calcium Carbonate-Vitamin D (CALCIUM 600 + D PO) Take 1 tablet by mouth daily.  . Cyanocobalamin (VITAMIN B-12 CR PO) Take by mouth.  . doxycycline (VIBRA-TABS) 100 MG tablet Take 1 tablet (100 mg total) by mouth 2 (two) times daily.  . fexofenadine (ALLEGRA) 180 MG tablet Take 180 mg by mouth daily.  . fish oil-omega-3 fatty acids 1000 MG capsule Take 2 g by mouth daily.  [name] ibuprofen (ADVIL,MOTRIN) 200 MG tablet Take 200 mg by mouth 2 (two) times daily.  . Multiple Vitamins-Minerals (CENTRUM PO) Take by mouth.  . vitamin C (ASCORBIC ACID) 500 MG tablet Take 500 mg by mouth.  . vitamin E 1000 UNIT capsule Take 1,000 Units by mouth daily.   No facility-administered encounter medications on file as of 01/14/2017.     Activities of Daily Living In your present state of health, do you have any difficulty performing the following activities: 01/14/2017  Hearing? N  Vision? N  Difficulty concentrating or making decisions? Y  Walking or climbing stairs? N  Dressing or bathing? N  Doing errands, shopping? Y  Comment Does not drive  Preparing Food and eating ? Y  Using the Toilet? N  In the past six months, have you accidently leaked urine? N  Do you have problems with loss of bowel control? N  Managing your Medications? Y  Managing your Finances? Y  Housekeeping or managing your Housekeeping? Y  Some recent data might be hidden    Patient Care Team: [name], DO as PCP - General (Family Medicine) Croitoru, Dani Gobble, MD as Consulting Physician (Cardiology) Pieter Partridge, DO as Consulting Physician (Neurology) Ninetta Lights, MD as Consulting Physician (Orthopedic Surgery) Gaynelle Arabian, MD as Consulting Physician (Orthopedic Surgery) Crista Luria, MD as Consulting Physician (Dermatology) Felton [facility]) Nolon Lennert (Dentistry)    Assessment:    Physical assessment deferred to PCP.  Exercise Activities and Dietary recommendations Current Exercise Habits:  The patient does not participate in regular exercise at present, Exercise limited by: None identified  Goals    . <enter goal here> (pt-stated)          "get back in to motion after knee surgery" (in January). Anticipates getting back [employer] for water exercise.       Fall Risk Fall Risk  01/14/2017 01/07/2016 01/16/2015  Falls in the past year? No No No   Depression Screen PHQ 2/9 Scores 01/14/2017 01/07/2016  PHQ - 2 Score 0 0     Cognitive Function MMSE - Mini Mental State Exam 01/14/2017 01/07/2016  Orientation to time 4 4  Orientation to Place 5 5  Registration 3 3  Attention/ Calculation 5 5  Recall  2 3  Language- name 2 objects 2 2  Language- repeat 1 1  Language- follow 3 step command 3 3  Language- read & follow direction 1 1  Write a sentence 1 1  Copy design 1 1  Total score 28 29        Immunization History  Administered Date(s) Administered  . Influenza, High Dose Seasonal PF 12/28/2014  . Influenza,inj,Quad PF,6+ Mos 02/20/2014  . Influenza,inj,Quad PF,6-35 Mos 11/19/2012  . Influenza-Unspecified 12/26/2015  . Pneumococcal Conjugate-13 12/28/2014  . Pneumococcal Polysaccharide-23 05/20/2006  . Tdap 09/08/2011   Screening Tests Health Maintenance  Topic Date Due  . DEXA SCAN  12/11/1995  . INFLUENZA VACCINE  10/15/2016  . TETANUS/TDAP  09/07/2021  . PNA vac Low Risk Adult  Completed      Plan:   Follow up with PCP as directed.  I have personally reviewed and noted the following in the patient's chart:   . Medical and social history . Use of alcohol, tobacco or illicit drugs  . Current medications and supplements . Functional ability and status . Nutritional status . Physical activity . Advanced directives . List of other physicians . Vitals . Screenings to include cognitive, depression, and falls . Referrals and appointments  In addition, I have reviewed and discussed with patient certain preventive protocols, quality metrics, and best  practice recommendations. A written personalized care plan for preventive services as well as general preventive health recommendations were provided to patient.     Williemae Area, RN  01/14/2017

## 2017-01-14 ENCOUNTER — Ambulatory Visit (INDEPENDENT_AMBULATORY_CARE_PROVIDER_SITE_OTHER): Payer: Medicare Other | Admitting: *Deleted

## 2017-01-14 ENCOUNTER — Encounter: Payer: Self-pay | Admitting: *Deleted

## 2017-01-14 ENCOUNTER — Encounter: Payer: Self-pay | Admitting: Family Medicine

## 2017-01-14 ENCOUNTER — Ambulatory Visit (INDEPENDENT_AMBULATORY_CARE_PROVIDER_SITE_OTHER): Payer: Medicare Other | Admitting: Family Medicine

## 2017-01-14 VITALS — BP 142/82 | HR 70 | Temp 98.1°F | Resp 16 | Ht 61.0 in | Wt 139.8 lb

## 2017-01-14 VITALS — BP 142/82 | HR 70 | Temp 98.1°F | Ht 61.0 in | Wt 139.8 lb

## 2017-01-14 DIAGNOSIS — M25561 Pain in right knee: Secondary | ICD-10-CM

## 2017-01-14 DIAGNOSIS — Z23 Encounter for immunization: Secondary | ICD-10-CM | POA: Diagnosis not present

## 2017-01-14 DIAGNOSIS — Z0000 Encounter for general adult medical examination without abnormal findings: Secondary | ICD-10-CM

## 2017-01-14 DIAGNOSIS — M25562 Pain in left knee: Secondary | ICD-10-CM

## 2017-01-14 NOTE — Progress Notes (Signed)
I have personally reviewed the Medicare Annual Wellness questionnaire and have noted 1. The patient's medical and social history 2. Their use of alcohol, tobacco or illicit drugs 3. Their current medications and supplements 4. The patient's functional ability including ADL's, fall risks, home safety risks and hearing or visual impairment. 5. Diet and physical activities 6. Evidence for depression or mood disorders 7. Reviewed Updated provider list, see scanned forms and [facility] Snapshot.   The patients weight, height, BMI and visual acuity have been recorded in the chart I have made referrals, counseling and provided education to the patient based review of the above and I have provided the pt with a written personalized care plan for preventive services.  I have provided the patient with a copy of your personalized plan for preventive services. Instructed to take the time to review along with their updated medication list.  Norman Piacentini  

## 2017-01-17 NOTE — Progress Notes (Signed)
Subjective:    Madison White is a 83 y.o. female and is here for a comprehensive physical exam.  Pertinent Gynecological History: No LMP recorded. Patient has had a hysterectomy.  Health Maintenance Due  Topic Date Due  . DEXA SCAN  12/11/1995   PMHx, SurgHx, SocialHx, Medications, and Allergies were reviewed in the Visit Navigator and updated as appropriate.   Past Medical History:  Diagnosis Date  . Diastolic dysfunction, left ventricle   . GERD (gastroesophageal reflux disease)   . 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  [name] History of blood transfusion   . History of rheumatic fever   . HTN (hypertension)    hx of refusing treatment--White coat HTN and/or situational HTN (?)   . Hyperlipidemia    hx of refusing treatment  . Insulin resistance    A1c's excellent per old records (6.1 in 2008 and 2009)  . Left bundle branch block 12/2011   Dr. Sallyanne Kuster at [facility]; ECHO and myocardial perfusion scan showed  septal and apical wall motion abnormality but she had no significant valvular disease and no ischemia.  She did have mildly decreased EF (39% on lexiscan and 50% on echo) and abnl LV relaxation.  Mild amount of PVCs.  Pt at higher risk for other conduction abnormalities, good chance of eventually requiring a pacemaker.   [name] LVH (left ventricular hypertrophy)   . Nonischemic cardiomyopathy ([place]) 2016   LV dysfunction due to LBBB/septal dyssynchrony  . Osteoarthritis    bilat knees--needs bilat TKA.  Ortho is trying steroid injections as of 10/23/15.  . Seasonal allergies   . Solitary pulmonary nodule 05/2016   8 mm pleural based nodule in RLL.  Radiology recommended f/u noncontrast CT in 6-12 mo.    Past Surgical History:  Procedure Laterality Date   . ABDOMINAL HYSTERECTOMY  1963   At the time of her last C/S; says she had partial bladder resection at that time as well  . CARDIOVASCULAR STRESS TEST  08/2005 & 12/2011   Low risk scans (on the 12/2011 scan she did have EF 39% with moderately severe LV dysfunction with septal dyssynergy probably contributed by LBBB  . Carotid dopplers  11/22/14; 11/30/16   NORMAL 2016 and 2018  . CATARACT EXTRACTION  08/14/11   both  . CESAREAN SECTION  X 5   One of her neonates died soon after birth  . CHOLECYSTECTOMY     1980's  . JOINT REPLACEMENT Bilateral    knee  . right knee surgery  05/2016   ? R TKA: no records.  [name] STRABISMUS SURGERY  1939  . TONSILLECTOMY  1938  . TRANSTHORACIC ECHOCARDIOGRAM  01/13/12   Septal dyssynergy, EF 50%, LV relaxation impaired.  No significant valvular abnormalities.    Family History  Problem Relation Age of Onset  . Hypertension Mother   . Heart disease Mother   . Hyperlipidemia Mother   . Diabetes Mother   . Heart disease Father   . Cancer Father    Social History  Substance Use Topics  . Smoking status: Former Smoker    Quit date: 05/31/1958  . Smokeless tobacco: Never Used  . Alcohol use 0.6 oz/week    1 Glasses of wine per week   Review of  Systems:   Pertinent items are noted in the HPI. Otherwise, ROS is negative.  Objective:   BP (!) 142/82   Pulse 70   Temp 98.1 F (36.7 C) (Oral)   Ht 5\' 1"  (1.549 m)   Wt 139 lb 12.8 oz (63.4 kg)   SpO2 96%   BMI 26.41 kg/m    Wt Readings from Last 3 Encounters:  01/14/17 139 lb 12.8 oz (63.4 kg)  01/14/17 139 lb 12.8 oz (63.4 kg)  12/18/16 135 lb 3.2 oz (61.3 kg)     Ht Readings from Last 3 Encounters:  01/14/17 5\' 1"  (1.549 m)  01/14/17 5\' 1"  (1.549 m)  12/18/16 5\' 1"  (1.549 m)   General appearance: alert, cooperative and appears stated age. Head: normocephalic, without obvious abnormality, atraumatic. Neck: no adenopathy, supple, symmetrical, trachea midline; thyroid not enlarged,  symmetric, no tenderness/mass/nodules. Lungs: clear to auscultation bilaterally. Heart: regular rate and rhythm Abdomen: soft, non-tender; no masses,  no organomegaly. Extremities: extremities normal, atraumatic, no cyanosis or edema. Skin: skin color, texture, turgor normal, no rashes or lesions. Lymph: cervical, supraclavicular, and axillary nodes normal; no abnormal inguinal nodes palpated. Neurologic: grossly normal.  Assessment/Plan:   Diagnoses and all orders for this visit:  Routine physical examination  Pain in both knees, unspecified chronicity -     Ambulatory referral to Physical Therapy  Encounter for immunization -     Flu vaccine HIGH DOSE PF   Patient Counseling: [x]    Nutrition: Stressed importance of moderation in sodium/caffeine intake, saturated fat and cholesterol, caloric balance, sufficient intake of fresh fruits, vegetables, fiber, calcium, iron, and 1 mg of folate supplement per day (for females capable of pregnancy).  [x]    Stressed the importance of regular exercise.   [x]    Substance Abuse: Discussed cessation/primary prevention of tobacco, alcohol, or other drug use; driving or other dangerous activities under the influence; availability of treatment for abuse.   [x]    Injury prevention: Discussed safety belts, safety helmets, smoke detector, smoking near bedding or upholstery.   [x]    Sexuality: Discussed sexually transmitted diseases, partner selection, use of condoms, avoidance of unintended pregnancy  and contraceptive alternatives.  [x]    Dental health: Discussed importance of regular tooth brushing, flossing, and dental visits.  [x]    Health maintenance and immunizations reviewed. Please refer to Health maintenance section.   [name], DO [place]

## 2017-02-04 ENCOUNTER — Ambulatory Visit: Payer: Medicare Other | Admitting: Family Medicine

## 2017-02-04 ENCOUNTER — Encounter: Payer: Self-pay | Admitting: Family Medicine

## 2017-02-04 VITALS — BP 130/80 | HR 77 | Ht 61.0 in | Wt 140.0 lb

## 2017-02-04 DIAGNOSIS — M9901 Segmental and somatic dysfunction of cervical region: Secondary | ICD-10-CM | POA: Diagnosis not present

## 2017-02-04 DIAGNOSIS — R319 Hematuria, unspecified: Secondary | ICD-10-CM | POA: Diagnosis not present

## 2017-02-04 DIAGNOSIS — M5011 Cervical disc disorder with radiculopathy,  high cervical region: Secondary | ICD-10-CM | POA: Diagnosis not present

## 2017-02-04 DIAGNOSIS — I10 Essential (primary) hypertension: Secondary | ICD-10-CM | POA: Diagnosis not present

## 2017-02-04 LAB — POC URINALSYSI DIPSTICK (AUTOMATED)
BILIRUBIN UA: NEGATIVE
Blood, UA: 200
Glucose, UA: NEGATIVE
KETONES UA: NEGATIVE
Nitrite, UA: NEGATIVE
SPEC GRAV UA: 1.025 (ref 1.010–1.025)
Urobilinogen, UA: 0.2 E.U./dL
pH, UA: 6 (ref 5.0–8.0)

## 2017-02-04 NOTE — Progress Notes (Signed)
    Subjective:  Madison White is a 83 y.o. female who presents today with a chief complaint of hematuria.   HPI:  Hematuria, acute issue Patient had one episode of frank hematuria yesterday morning no symptoms since then.  No dysuria.  No increased frequency.  No fevers or chills.  No abdominal pain.  The day prior she did have one episode of sharp right flank pain.  Nothing like this before.  No history of kidney stones, however she does have significant family history of this.  No medications tried.  No obvious alleviating or aggravating factors.  ROS: Per HPI  PMH: Smoking history reviewed. Former smoker.    Objective:  Physical Exam: BP 130/80 (BP Location: Left Arm, Cuff Size: Normal)   Pulse 77   Ht 5\' 1"  (1.549 m)   Wt 140 lb (63.5 kg)   BMI 26.45 kg/m   Gen: NAD, resting comfortably CV: RRR with no murmurs appreciated Pulm: NWOB, CTAB with no crackles, wheezes, or rhonchi GI: Normal bowel sounds present. Soft, Nontender, Nondistended. MSK: No CVA tenderness. Skin: Warm, dry Neuro: Grossly normal, moves all extremities Psych: Normal affect and thought content  Results for orders placed or performed in visit on 02/04/17 (from the past 72 hour(s))  POCT Urinalysis Dipstick (Automated)     Status: Abnormal   Collection Time: 02/04/17 10:41 AM  Result Value Ref Range   Color, UA Yellow    Clarity, UA Cloudy    Glucose, UA Negative    Bilirubin, UA Negative    Ketones, UA Negative    Spec Grav, UA 1.025 1.010 - 1.025   Blood, UA 200 Ery/uL    pH, UA 6.0 5.0 - 8.0   Protein, UA 15 mg/dL    Urobilinogen, UA 0.2 0.2 or 1.0 E.U./dL   Nitrite, UA Negative    Leukocytes, UA Large (3+) (A) Negative     Assessment/Plan:  Hematuria, acute issue UA positive for blood and leukocytes.  She does not have clinical symptoms of UTI-we will not start empiric antibiotics today.  She appears well clinically today.  Will check urine culture rule out UTI.[name]  Her one episode of  hematuria likely secondary to a small renal stone that she had passed.  Advised patient follow-up in 4-6 weeks to monitor for resolution of her hematuria.  Advised patient to return sooner if she continues to have episode of gross hematuria.  If develops severe abdominal pain or more gross hematuria, would consider abdominal CT to rule out stone and/or urology referral.  Elevated blood pressure reading, Chronic Problem Mild exacerbation of chronic problem. Typically at goal per patient.  She reports a history of whitecoat hypertension.  She is not on any medications.  Advised patient continue checking blood pressure at home and return if persistently elevated above 150/90.  No medication start medications today.  Algis Greenhouse. Jerline Pain, MD 02/04/2017 11:30 AM

## 2017-02-04 NOTE — Addendum Note (Signed)
Addended by: Kayren Eaves T on: 02/04/2017 02:56 PM   Modules accepted: Orders

## 2017-02-05 LAB — URINE CULTURE
MICRO NUMBER:: 81314895
SPECIMEN QUALITY:: ADEQUATE

## 2017-02-09 NOTE — Progress Notes (Signed)
Urine culture negative. No need for antibiotics. She should be seen again in 4-6 weeks to make sure the blood in her urine resolves, or sooner if her symptoms return.  Algis Greenhouse. Jerline Pain, MD 02/09/2017 10:36 AM

## 2017-02-25 ENCOUNTER — Emergency Department (HOSPITAL_BASED_OUTPATIENT_CLINIC_OR_DEPARTMENT_OTHER)
Admission: EM | Admit: 2017-02-25 | Discharge: 2017-02-25 | Disposition: A | Discharge status: Home / Self Care | Payer: Medicare Other | Attending: Emergency Medicine | Admitting: Emergency Medicine

## 2017-02-25 ENCOUNTER — Emergency Department (HOSPITAL_BASED_OUTPATIENT_CLINIC_OR_DEPARTMENT_OTHER): Payer: Medicare Other

## 2017-02-25 ENCOUNTER — Other Ambulatory Visit: Payer: Self-pay

## 2017-02-25 ENCOUNTER — Encounter (HOSPITAL_BASED_OUTPATIENT_CLINIC_OR_DEPARTMENT_OTHER): Payer: Self-pay | Admitting: *Deleted

## 2017-02-25 DIAGNOSIS — Z79899 Other long term (current) drug therapy: Secondary | ICD-10-CM | POA: Insufficient documentation

## 2017-02-25 DIAGNOSIS — Z91040 Latex allergy status: Secondary | ICD-10-CM | POA: Insufficient documentation

## 2017-02-25 DIAGNOSIS — Z7982 Long term (current) use of aspirin: Secondary | ICD-10-CM | POA: Diagnosis not present

## 2017-02-25 DIAGNOSIS — R109 Unspecified abdominal pain: Secondary | ICD-10-CM | POA: Diagnosis not present

## 2017-02-25 DIAGNOSIS — N200 Calculus of kidney: Secondary | ICD-10-CM | POA: Insufficient documentation

## 2017-02-25 DIAGNOSIS — I10 Essential (primary) hypertension: Secondary | ICD-10-CM | POA: Insufficient documentation

## 2017-02-25 DIAGNOSIS — Z87891 Personal history of nicotine dependence: Secondary | ICD-10-CM | POA: Diagnosis not present

## 2017-02-25 DIAGNOSIS — R1111 Vomiting without nausea: Secondary | ICD-10-CM | POA: Diagnosis not present

## 2017-02-25 DIAGNOSIS — Z96653 Presence of artificial knee joint, bilateral: Secondary | ICD-10-CM | POA: Insufficient documentation

## 2017-02-25 LAB — COMPREHENSIVE METABOLIC PANEL
ALBUMIN: 4.2 g/dL (ref 3.5–5.0)
ALK PHOS: 54 U/L (ref 38–126)
ALT: 14 U/L (ref 14–54)
AST: 25 U/L (ref 15–41)
Anion gap: 11 (ref 5–15)
BILIRUBIN TOTAL: 0.6 mg/dL (ref 0.3–1.2)
BUN: 10 mg/dL (ref 6–20)
CO2: 24 mmol/L (ref 22–32)
Calcium: 9.5 mg/dL (ref 8.9–10.3)
Chloride: 104 mmol/L (ref 101–111)
Creatinine, Ser: 0.96 mg/dL (ref 0.44–1.00)
GFR, EST NON AFRICAN AMERICAN: 52 mL/min — AB (ref >60)
Glucose, Bld: 158 mg/dL — ABNORMAL HIGH (ref 65–99)
POTASSIUM: 3.1 mmol/L — AB (ref 3.5–5.1)
Sodium: 139 mmol/L (ref 135–145)
TOTAL PROTEIN: 7.3 g/dL (ref 6.5–8.1)

## 2017-02-25 LAB — URINALYSIS, MICROSCOPIC (REFLEX)

## 2017-02-25 LAB — URINALYSIS, ROUTINE W REFLEX MICROSCOPIC
Bilirubin Urine: NEGATIVE
Glucose, UA: NEGATIVE mg/dL
Ketones, ur: NEGATIVE mg/dL
NITRITE: NEGATIVE
PROTEIN: NEGATIVE mg/dL
Specific Gravity, Urine: >1.03 — ABNORMAL HIGH (ref 1.005–1.030)
pH: 5.5 (ref 5.0–8.0)

## 2017-02-25 LAB — CBC
HEMATOCRIT: 44.2 % (ref 36.0–46.0)
Hemoglobin: 14.9 g/dL (ref 12.0–15.0)
MCH: 28.7 pg (ref 26.0–34.0)
MCHC: 33.7 g/dL (ref 30.0–36.0)
MCV: 85.2 fL (ref 78.0–100.0)
PLATELETS: 338 10*3/uL (ref 150–400)
RBC: 5.19 MIL/uL — AB (ref 3.87–5.11)
RDW: 14.1 % (ref 11.5–15.5)
WBC: 13.8 10*3/uL — AB (ref 4.0–10.5)

## 2017-02-25 MED ORDER — KETOROLAC TROMETHAMINE 15 MG/ML IJ SOLN
15.0000 mg | Freq: Once | INTRAMUSCULAR | Status: AC
  Administered 2017-02-25: 15 mg via INTRAVENOUS
  Filled 2017-02-25: qty 1

## 2017-02-25 MED ORDER — FENTANYL CITRATE (PF) 100 MCG/2ML IJ SOLN
50.0000 ug | INTRAMUSCULAR | Status: DC | PRN
  Administered 2017-02-25: 50 ug via INTRAVENOUS
  Filled 2017-02-25: qty 2

## 2017-02-25 MED ORDER — SODIUM CHLORIDE 0.9 % IV BOLUS (SEPSIS)
500.0000 mL | Freq: Once | INTRAVENOUS | Status: AC
  Administered 2017-02-25: 500 mL via INTRAVENOUS

## 2017-02-25 MED ORDER — NITROFURANTOIN MONOHYD MACRO 100 MG PO CAPS: 100.0000 mg | ORAL_CAPSULE | Freq: Two times a day (BID) | ORAL | 0 refills | Status: DC

## 2017-02-25 MED ORDER — OXYCODONE-ACETAMINOPHEN 5-325 MG PO TABS: 1.0000 | ORAL_TABLET | Freq: Four times a day (QID) | ORAL | 0 refills | Status: DC | PRN

## 2017-02-25 MED ORDER — ONDANSETRON HCL 4 MG/2ML IJ SOLN
4.0000 mg | Freq: Once | INTRAMUSCULAR | Status: AC | PRN
  Administered 2017-02-25: 4 mg via INTRAVENOUS

## 2017-02-25 MED ORDER — MORPHINE SULFATE (PF) 2 MG/ML IV SOLN
2.0000 mg | Freq: Once | INTRAVENOUS | Status: AC
  Administered 2017-02-25: 2 mg via INTRAVENOUS
  Filled 2017-02-25: qty 1

## 2017-02-25 MED ORDER — SODIUM CHLORIDE 0.9 % IV BOLUS (SEPSIS)
1000.0000 mL | Freq: Once | INTRAVENOUS | Status: AC
  Administered 2017-02-25: 1000 mL via INTRAVENOUS

## 2017-02-25 MED ORDER — ONDANSETRON HCL 4 MG/2ML IJ SOLN
INTRAMUSCULAR | Status: AC
  Administered 2017-02-25: 4 mg via INTRAVENOUS
  Filled 2017-02-25: qty 2

## 2017-02-25 MED ORDER — NITROFURANTOIN MONOHYD MACRO 100 MG PO CAPS
100.0000 mg | ORAL_CAPSULE | Freq: Once | ORAL | Status: AC
  Administered 2017-02-25: 100 mg via ORAL
  Filled 2017-02-25: qty 1

## 2017-02-25 NOTE — ED Notes (Signed)
ED Provider at bedside. 

## 2017-02-25 NOTE — ED Provider Notes (Signed)
[facility] EMERGENCY DEPARTMENT Provider Note   CSN: [id number] Arrival date & time: 02/25/17  1419     History   Chief Complaint Chief Complaint  Patient presents with  . Flank Pain    HPI Madison White is a 83 y.o. female.  HPI  83 y.o. female with a hx of HTN, HLD, LVH, presents to the Emergency Department today due to sudden onset of left flank pain x 1 hour ago. Notes isolated pain that is intermittent with sharp sensation. Rates 8/10. Denies pain currently after fentanyl in lobby. Notes x 1 episode of emesis. Mild nausea. No CP/SOB. Notes radiation of pain into left groin. No dysuria. Noted hematuria with PCP who thought kidney stone may be present last week. No fevers. No cough/congestion. No other symptoms noted   Past Medical History:  Diagnosis Date  . Diastolic dysfunction, left ventricle   . GERD (gastroesophageal reflux disease)   . 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  [name] History of blood transfusion   . History of rheumatic fever   . HTN (hypertension)    hx of refusing treatment--White coat HTN and/or situational HTN (?)   . Hyperlipidemia    hx of refusing treatment  . Insulin resistance    A1c's excellent per old records (6.1 in 2008 and 2009)  . Left bundle branch block 12/2011   Dr. Sallyanne Kuster at [facility]; ECHO and myocardial perfusion scan showed  septal and apical wall motion abnormality but she had no significant valvular disease and no ischemia.  She did have mildly decreased EF (39% on lexiscan and 50% on echo) and abnl LV relaxation.  Mild amount of PVCs.  Pt at higher risk for other conduction abnormalities, good chance of eventually requiring a pacemaker.   [name] LVH (left ventricular hypertrophy)   . Nonischemic  cardiomyopathy ([place]) 2016   LV dysfunction due to LBBB/septal dyssynchrony  . Osteoarthritis    bilat knees--needs bilat TKA.  Ortho is trying steroid injections as of 10/23/15.  . Seasonal allergies   . Solitary pulmonary nodule 05/2016   8 mm pleural based nodule in RLL.  Radiology recommended f/u noncontrast CT in 6-12 mo.    Patient Active Problem List   Diagnosis Date Noted  . Nonischemic cardiomyopathy ([place]) 11/17/2014  . Health maintenance examination 02/26/2014  . Upper airway cough syndrome 02/26/2014  . Constipation, chronic 02/26/2014  . GERD (gastroesophageal reflux disease) 11/19/2012  . Dyslipidemia 09/02/2012  . Left bundle branch block 12/18/2011  . HTN (hypertension), benign 05/31/2011    Past Surgical History:  Procedure Laterality Date  . ABDOMINAL HYSTERECTOMY  1963   At the time of her last C/S; says she had partial bladder resection at that time as well  . CARDIOVASCULAR STRESS TEST  08/2005 & 12/2011   Low risk scans (on the 12/2011 scan she did have EF 39% with moderately severe LV dysfunction with septal dyssynergy probably contributed by LBBB  . Carotid dopplers  11/22/14; 11/30/16   NORMAL 2016 and 2018  . CATARACT EXTRACTION  08/14/11   both  . CESAREAN SECTION  X 5   One of her neonates died soon after birth  . CHOLECYSTECTOMY     1980's  . JOINT REPLACEMENT Bilateral    knee  .  right knee surgery  05/2016   ? R TKA: no records.  [name] STRABISMUS SURGERY  1939  . TONSILLECTOMY  1938  . TRANSTHORACIC ECHOCARDIOGRAM  01/13/12   Septal dyssynergy, EF 50%, LV relaxation impaired.  No significant valvular abnormalities.    OB History    No data available       Home Medications    Prior to Admission medications   Medication Sig Start Date End Date Taking? Authorizing Provider  acetaminophen (TYLENOL) 500 MG tablet Take 1,000 mg by mouth. 05/28/16   [provider]  aspirin 81 MG tablet Take 1 tablet (81 mg total) by mouth daily. 06/17/12    McGowen, Adrian Blackwater, MD  benzonatate (TESSALON PERLES) 100 MG capsule Take 1 capsule (100 mg total) by mouth 3 (three) times daily as needed for cough. 12/18/16   [name], DO  Calcium Carbonate-Vitamin D (CALCIUM 600 + D PO) Take 1 tablet by mouth daily.    [provider]  Cyanocobalamin (VITAMIN B-12 CR PO) Take by mouth.    [provider]  fexofenadine (ALLEGRA) 180 MG tablet Take 180 mg by mouth daily.    [provider]  fish oil-omega-3 fatty acids 1000 MG capsule Take 2 g by mouth daily.    [provider]  ibuprofen (ADVIL,MOTRIN) 200 MG tablet Take 200 mg by mouth 2 (two) times daily.    [provider]  Multiple Vitamins-Minerals (CENTRUM PO) Take by mouth.    [provider]  vitamin C (ASCORBIC ACID) 500 MG tablet Take 500 mg by mouth.    [provider]  vitamin E 1000 UNIT capsule Take 1,000 Units by mouth daily.    [provider]    Family History Family History  Problem Relation Age of Onset  . Hypertension Mother   . Heart disease Mother   . Hyperlipidemia Mother   . Diabetes Mother   . Heart disease Father   . Cancer Father     Social History Social History   Tobacco Use  . Smoking status: Former Smoker    Last attempt to quit: 05/31/1958    Years since quitting: 58.7  . Smokeless tobacco: Never Used  Substance Use Topics  . Alcohol use: Yes    Alcohol/week: 0.6 oz    Types: 1 Glasses of wine per week  . Drug use: No     Allergies   Penicillins; Nickel; Avapro [irbesartan]; Lisinopril; Simvastatin; Latex; Sulfa antibiotics; and Sulfamethoxazole   Review of Systems Review of Systems ROS reviewed and all are negative for acute change except as noted in the HPI.  Physical Exam Updated Vital Signs BP (!) 161/85 (BP Location: Right Arm)   Pulse 81   Temp 97.7 F (36.5 C)   Resp 20   Ht 5' (1.524 m)   Wt 62.1 kg (137 lb)   SpO2 97%   BMI 26.76 kg/m   Physical Exam    Constitutional: She is oriented to person, place, and time. She appears well-developed and well-nourished. No distress.  HENT:  Head: Normocephalic and atraumatic.  Right Ear: Tympanic membrane, external ear and ear canal normal.  Left Ear: Tympanic membrane, external ear and ear canal normal.  Nose: Nose normal.  Mouth/Throat: Uvula is midline, oropharynx is clear and moist and mucous membranes are normal. No trismus in the jaw. No oropharyngeal exudate, posterior oropharyngeal erythema or tonsillar abscesses.  Eyes: EOM are normal. Pupils are equal, round, and reactive to light.  Neck: Normal range  of motion. Neck supple. No tracheal deviation present.  Cardiovascular: Normal rate, regular rhythm, S1 normal, S2 normal, normal heart sounds, intact distal pulses and normal pulses.  Pulmonary/Chest: Effort normal and breath sounds normal. No respiratory distress. She has no decreased breath sounds. She has no wheezes. She has no rhonchi. She has no rales.  Abdominal: Normal appearance and bowel sounds are normal. There is no tenderness. There is CVA tenderness (left). There is no rigidity, no rebound, no guarding, no tenderness at McBurney's point and negative Murphy's sign.  Musculoskeletal: Normal range of motion.  Neurological: She is alert and oriented to person, place, and time.  Skin: Skin is warm and dry.  Psychiatric: She has a normal mood and affect. Her speech is normal and behavior is normal. Thought content normal.  Nursing note and vitals reviewed.  ED Treatments / Results  Labs (all labs ordered are listed, but only abnormal results are displayed) Labs Reviewed  URINALYSIS, ROUTINE W REFLEX MICROSCOPIC - Abnormal; Notable for the following components:      Result Value   APPearance HAZY (*)    Specific Gravity, Urine >1.030 (*)    Hgb urine dipstick MODERATE (*)    Leukocytes, UA SMALL (*)    All other components within normal limits  URINALYSIS, MICROSCOPIC (REFLEX) -  Abnormal; Notable for the following components:   Bacteria, UA FEW (*)    Squamous Epithelial / LPF 0-5 (*)    All other components within normal limits  COMPREHENSIVE METABOLIC PANEL - Abnormal; Notable for the following components:   Potassium 3.1 (*)    Glucose, Bld 158 (*)    GFR calc non Af Amer 52 (*)    All other components within normal limits  CBC - Abnormal; Notable for the following components:   WBC 13.8 (*)    RBC 5.19 (*)    All other components within normal limits  URINE CULTURE    EKG  EKG Interpretation None       Radiology Ct Renal Stone Study  Result Date: 02/25/2017 CLINICAL DATA:  83 year old with left flank pain since 2 p.m. today. Vomiting. EXAM: CT ABDOMEN AND PELVIS WITHOUT CONTRAST TECHNIQUE: Multidetector CT imaging of the abdomen and pelvis was performed following the standard protocol without IV contrast. COMPARISON:  None. FINDINGS: Lower chest: 4 mm pleural-based nodule in the right middle lobe on sequence 4, image 1 is probably stable. There is also a stable peripheral 7 mm nodule in the medial right lower lobe on sequence 4, image 13. Scarring along the medial right lower lobe adjacent to degenerative spine changes. No large pleural effusions. Heart size is mildly enlarged. No significant pericardial fluid. Hepatobiliary: Gallbladder has been removed. Mild dilatation of the extrahepatic biliary system probably secondary to the cholecystectomy. Pancreas: Normal appearance of the pancreas without inflammation or duct dilatation. Spleen: Normal appearance of spleen without enlargement. Adrenals/Urinary Tract: Adrenal glands are within normal limits. Multiple cysts or multilocular cysts throughout the posterior right kidney. Few densities or calcifications associated this low-density structure. This low-density or cystic conglomeration in the right kidney measures greater than 7.4 cm. Negative for right hydronephrosis. Small high-density structures in the  distal right ureter region and cannot exclude nonobstructive right ureter stones. 3 mm stone in the distal left ureter near the left ureterovesical junction. Dilatation of the left ureter with moderate to severe left hydronephrosis. Left perinephric edema. Stomach/Bowel: Small hiatal hernia. There is a small duodenal diverticulum near the ampulla. No small bowel dilatation. Appendix is  normal. Multiple large diverticula involving the sigmoid colon and descending colon. No evidence for bowel inflammation or obstruction. Vascular/Lymphatic: Atherosclerotic calcifications involving the aorta and visceral arteries without an aortic aneurysm. Small lymph nodes along the iliac chains but no significant abdominal or pelvic lymphadenopathy. Reproductive: Status post hysterectomy. No adnexal masses. Other: No free fluid.  Negative for free air. Musculoskeletal: Severe disc space loss at L51-S1. Multilevel degenerative facet disease in lumbar spine. IMPRESSION: Moderate to severe left hydroureteronephrosis due to a 3 mm stone at the distal left ureter near the left ureterovesical junction. Left perinephric edema. **An incidental finding of potential clinical significance has been found. Multiloculated cystic collection/lesion involving the posterior right kidney. Structure measures up to 7.4 cm in the craniocaudal dimension. Cystic neoplasm cannot be excluded and consider further characterization with MRI (with and without contrast) or at least a renal ultrasound. ** Probable nonobstructive stone or stones in the distal right ureter. Stable 7 mm nodule in the right lower lobe. Consider a 12 month chest CT follow-up to ensure stability. Electronically Signed   By: Markus Daft M.D.   On: 02/25/2017 17:26    Procedures Procedures (including critical care time)  Medications Ordered in ED Medications  fentaNYL (SUBLIMAZE) injection 50 mcg (50 mcg Intravenous Given 02/25/17 1615)  ondansetron (ZOFRAN) injection 4 mg (4 mg  Intravenous Given 02/25/17 1539)  sodium chloride 0.9 % bolus 1,000 mL (0 mLs Intravenous Stopped 02/25/17 1732)  ketorolac (TORADOL) 15 MG/ML injection 15 mg (15 mg Intravenous Given 02/25/17 1710)  sodium chloride 0.9 % bolus 500 mL (0 mLs Intravenous Stopped 02/25/17 1822)  morphine 2 MG/ML injection 2 mg (2 mg Intravenous Given 02/25/17 1823)  nitrofurantoin (macrocrystal-monohydrate) (MACROBID) capsule 100 mg (100 mg Oral Given 02/25/17 1929)     Initial Impression / Assessment and Plan / ED Course  I have reviewed the triage vital signs and the nursing notes.  Pertinent labs & imaging results that were available during my care of the patient were reviewed by me and considered in my medical decision making (see chart for details).  Final Clinical Impressions(s) / ED Diagnoses  {I have reviewed and evaluated the relevant laboratory values. {I have reviewed and evaluated the relevant imaging studies.  {I have reviewed the relevant previous healthcare records.  {I obtained HPI from historian.   ED Course:  Assessment: Pt is a 82 y.o. female with a hx of HTN, HLD, LVH, presents to the Emergency Department today due to sudden onset of left flank pain x 1 hour ago. Notes isolated pain that is intermittent with sharp sensation. Rates 8/10. Denies pain currently after fentanyl in lobby. Notes x 1 episode of emesis. Mild nausea. No CP/SOB. Notes radiation of pain into left groin. No dysuria. Noted hematuria with PCP who thought kidney stone may be present last week. No fevers. No cough/congestion.. On exam, pt in NAD. Nontoxic/nonseptic appearing. VSS. Afebrile. Lungs CTA. Heart RRR. Abdomen nontender soft. CVA tenderness noted on left flank. UA with evidence of hematuria. No infection. CBC/BMP unremarkable. CT Renal shows 39mm stone at left distal ureter near UVJ with moderate to severe Hydronephrosis. There is an incidental right cystic lesion with potential correlation with malignancy. Given  analgesia and fluids in ED. Seen by attending physician. Discussed with Urology. Plan is to DC patient home with ABX as questionable UTI. Culture sent prior to ABX therapy. Strict return precautions given. Given Rx Percocet. I have reviewed the [place] Controlled Substance Reporting System. Plan is to DC home  with follow up to Urology. At time of discharge, Patient is in no acute distress. Vital Signs are stable. Patient is able to ambulate. Patient able to tolerate PO.   Disposition/Plan:  DC Home Additional Verbal discharge instructions given and discussed with patient.  Pt Instructed to f/u with Urology in the next week for evaluation and treatment of symptoms. Return precautions given Pt acknowledges and agrees with plan  Supervising Physician Charlesetta Shanks, MD  Final diagnoses:  Nephrolithiasis    ED Discharge Orders    None       Shary Decamp, PA-C 02/25/17 2020    Charlesetta Shanks, MD 02/26/17 [phone]

## 2017-02-25 NOTE — Discharge Instructions (Signed)
Please read and follow all provided instructions.  Your diagnoses today include:  1. Nephrolithiasis     Tests performed today include: Urine test that showed blood in your urine and no infection CT scan which showed a 3 millimeter kidney on the left side Blood test that showed normal kidney function Vital signs. See below for your results today.   Medications prescribed:   Take any prescribed medications only as directed.  Home care instructions:  Follow any educational materials contained in this packet.  Please double your fluid intake for the next several days. Strain your urine and save any stones that may pass.   BE VERY CAREFUL not to take multiple medicines containing Tylenol (also called acetaminophen). Doing so can lead to an overdose which can damage your liver and cause liver failure and possibly death.   Follow-up instructions: Please follow-up with your urologist or the urologist referral (provided on front page) in the next 1 week for further evaluation of your symptoms.  If you need to return to the Emergency Department, go to [facility] and not [facility]. The urologists are located at [facility] and can better care for you at this location.  Return instructions:  If you need to return to the Emergency Department, go to [facility] and not [facility]. The urologists are located at [facility] and can better care for you at this location.  Please return to the Emergency Department if you experience worsening symptoms.  Please return if you develop fever or uncontrolled pain or vomiting. Please return if you have any other emergent concerns.  Additional Information:  Your vital signs today were: BP (!) 150/78    Pulse 92    Temp 97.7 F (36.5 C)    Resp 14    Ht 5' (1.524 m)    Wt 62.1 kg (137 lb)    SpO2 96%    BMI 26.76 kg/m  If your blood pressure (BP) was elevated above 135/85 this visit, please have this repeated by  your doctor within one month. --------------

## 2017-02-25 NOTE — ED Notes (Signed)
Pt vomited x1.  

## 2017-02-25 NOTE — ED Triage Notes (Signed)
Pt c/o sudden onset of left flank pain x 1 hr

## 2017-02-26 DIAGNOSIS — N132 Hydronephrosis with renal and ureteral calculous obstruction: Secondary | ICD-10-CM | POA: Diagnosis not present

## 2017-02-26 DIAGNOSIS — R109 Unspecified abdominal pain: Secondary | ICD-10-CM | POA: Diagnosis not present

## 2017-02-26 DIAGNOSIS — R319 Hematuria, unspecified: Secondary | ICD-10-CM | POA: Diagnosis not present

## 2017-02-26 DIAGNOSIS — N2889 Other specified disorders of kidney and ureter: Secondary | ICD-10-CM | POA: Diagnosis not present

## 2017-02-26 DIAGNOSIS — R300 Dysuria: Secondary | ICD-10-CM | POA: Diagnosis not present

## 2017-02-26 DIAGNOSIS — N1330 Unspecified hydronephrosis: Secondary | ICD-10-CM | POA: Diagnosis not present

## 2017-02-27 LAB — URINE CULTURE

## 2017-03-01 DIAGNOSIS — N281 Cyst of kidney, acquired: Secondary | ICD-10-CM | POA: Insufficient documentation

## 2017-03-01 DIAGNOSIS — N132 Hydronephrosis with renal and ureteral calculous obstruction: Secondary | ICD-10-CM | POA: Insufficient documentation

## 2017-03-05 DIAGNOSIS — N201 Calculus of ureter: Secondary | ICD-10-CM | POA: Diagnosis not present

## 2017-03-05 DIAGNOSIS — N281 Cyst of kidney, acquired: Secondary | ICD-10-CM | POA: Diagnosis not present

## 2017-03-05 DIAGNOSIS — N132 Hydronephrosis with renal and ureteral calculous obstruction: Secondary | ICD-10-CM | POA: Diagnosis not present

## 2017-03-21 DIAGNOSIS — N281 Cyst of kidney, acquired: Secondary | ICD-10-CM | POA: Diagnosis not present

## 2017-05-05 ENCOUNTER — Observation Stay (HOSPITAL_COMMUNITY)
Admission: EM | Admit: 2017-05-05 | Discharge: 2017-05-06 | Disposition: A | Discharge status: Home / Self Care | Payer: Medicare HMO | Attending: Internal Medicine | Admitting: Internal Medicine

## 2017-05-05 ENCOUNTER — Encounter (HOSPITAL_COMMUNITY): Payer: Self-pay | Admitting: Emergency Medicine

## 2017-05-05 ENCOUNTER — Emergency Department (HOSPITAL_COMMUNITY): Payer: Medicare HMO

## 2017-05-05 ENCOUNTER — Ambulatory Visit (HOSPITAL_COMMUNITY)
Admission: EM | Disposition: A | Discharge status: Home / Self Care | Payer: Self-pay | Source: Home / Self Care | Attending: Physician Assistant

## 2017-05-05 ENCOUNTER — Other Ambulatory Visit: Payer: Self-pay

## 2017-05-05 DIAGNOSIS — I442 Atrioventricular block, complete: Secondary | ICD-10-CM | POA: Diagnosis not present

## 2017-05-05 DIAGNOSIS — I5022 Chronic systolic (congestive) heart failure: Secondary | ICD-10-CM | POA: Diagnosis not present

## 2017-05-05 DIAGNOSIS — I428 Other cardiomyopathies: Secondary | ICD-10-CM | POA: Insufficient documentation

## 2017-05-05 DIAGNOSIS — Z880 Allergy status to penicillin: Secondary | ICD-10-CM | POA: Insufficient documentation

## 2017-05-05 DIAGNOSIS — Z8679 Personal history of other diseases of the circulatory system: Secondary | ICD-10-CM | POA: Diagnosis present

## 2017-05-05 DIAGNOSIS — Z8249 Family history of ischemic heart disease and other diseases of the circulatory system: Secondary | ICD-10-CM | POA: Insufficient documentation

## 2017-05-05 DIAGNOSIS — Z950 Presence of cardiac pacemaker: Secondary | ICD-10-CM

## 2017-05-05 DIAGNOSIS — K219 Gastro-esophageal reflux disease without esophagitis: Secondary | ICD-10-CM | POA: Insufficient documentation

## 2017-05-05 DIAGNOSIS — Z87891 Personal history of nicotine dependence: Secondary | ICD-10-CM | POA: Diagnosis not present

## 2017-05-05 DIAGNOSIS — R911 Solitary pulmonary nodule: Secondary | ICD-10-CM | POA: Insufficient documentation

## 2017-05-05 DIAGNOSIS — M170 Bilateral primary osteoarthritis of knee: Secondary | ICD-10-CM | POA: Insufficient documentation

## 2017-05-05 DIAGNOSIS — Z882 Allergy status to sulfonamides status: Secondary | ICD-10-CM | POA: Insufficient documentation

## 2017-05-05 DIAGNOSIS — I110 Hypertensive heart disease with heart failure: Secondary | ICD-10-CM | POA: Diagnosis not present

## 2017-05-05 DIAGNOSIS — R0602 Shortness of breath: Secondary | ICD-10-CM | POA: Diagnosis not present

## 2017-05-05 DIAGNOSIS — R001 Bradycardia, unspecified: Secondary | ICD-10-CM | POA: Diagnosis not present

## 2017-05-05 DIAGNOSIS — Z91040 Latex allergy status: Secondary | ICD-10-CM | POA: Insufficient documentation

## 2017-05-05 DIAGNOSIS — R5383 Other fatigue: Secondary | ICD-10-CM | POA: Diagnosis not present

## 2017-05-05 DIAGNOSIS — Z7982 Long term (current) use of aspirin: Secondary | ICD-10-CM | POA: Diagnosis not present

## 2017-05-05 DIAGNOSIS — I509 Heart failure, unspecified: Secondary | ICD-10-CM | POA: Diagnosis not present

## 2017-05-05 DIAGNOSIS — E785 Hyperlipidemia, unspecified: Secondary | ICD-10-CM | POA: Diagnosis not present

## 2017-05-05 DIAGNOSIS — I447 Left bundle-branch block, unspecified: Secondary | ICD-10-CM | POA: Insufficient documentation

## 2017-05-05 DIAGNOSIS — R069 Unspecified abnormalities of breathing: Secondary | ICD-10-CM | POA: Diagnosis not present

## 2017-05-05 HISTORY — DX: Presence of cardiac pacemaker: Z95.0

## 2017-05-05 HISTORY — PX: BIV PACEMAKER INSERTION CRT-P: EP1199

## 2017-05-05 HISTORY — PX: BI-VENTRICULAR PACEMAKER INSERTION (CRT-P): SHX5750

## 2017-05-05 LAB — CBC WITH DIFFERENTIAL/PLATELET
BASOS ABS: 0 10*3/uL (ref 0.0–0.1)
BASOS PCT: 1 %
EOS ABS: 0.2 10*3/uL (ref 0.0–0.7)
EOS PCT: 3 %
HCT: 39 % (ref 36.0–46.0)
Hemoglobin: 13 g/dL (ref 12.0–15.0)
Lymphocytes Relative: 22 %
Lymphs Abs: 1.6 10*3/uL (ref 0.7–4.0)
MCH: 29.1 pg (ref 26.0–34.0)
MCHC: 33.3 g/dL (ref 30.0–36.0)
MCV: 87.2 fL (ref 78.0–100.0)
MONO ABS: 0.4 10*3/uL (ref 0.1–1.0)
Monocytes Relative: 6 %
NEUTROS ABS: 5.2 10*3/uL (ref 1.7–7.7)
Neutrophils Relative %: 70 %
PLATELETS: 223 10*3/uL (ref 150–400)
RBC: 4.47 MIL/uL (ref 3.87–5.11)
RDW: 13.7 % (ref 11.5–15.5)
WBC: 7.6 10*3/uL (ref 4.0–10.5)

## 2017-05-05 LAB — COMPREHENSIVE METABOLIC PANEL
ALBUMIN: 3.3 g/dL — AB (ref 3.5–5.0)
ALT: 13 U/L — ABNORMAL LOW (ref 14–54)
ANION GAP: 10 (ref 5–15)
AST: 19 U/L (ref 15–41)
Alkaline Phosphatase: 40 U/L (ref 38–126)
BUN: 14 mg/dL (ref 6–20)
CHLORIDE: 108 mmol/L (ref 101–111)
CO2: 24 mmol/L (ref 22–32)
Calcium: 8.9 mg/dL (ref 8.9–10.3)
Creatinine, Ser: 0.87 mg/dL (ref 0.44–1.00)
GFR calc Af Amer: >60 mL/min (ref >60)
GFR calc non Af Amer: 59 mL/min — ABNORMAL LOW (ref >60)
Glucose, Bld: 102 mg/dL — ABNORMAL HIGH (ref 65–99)
POTASSIUM: 4.1 mmol/L (ref 3.5–5.1)
SODIUM: 142 mmol/L (ref 135–145)
Total Bilirubin: 0.6 mg/dL (ref 0.3–1.2)
Total Protein: 6 g/dL — ABNORMAL LOW (ref 6.5–8.1)

## 2017-05-05 LAB — I-STAT TROPONIN, ED: Troponin i, poc: 0.06 ng/mL (ref 0.00–0.08)

## 2017-05-05 LAB — BRAIN NATRIURETIC PEPTIDE: B NATRIURETIC PEPTIDE 5: 815.4 pg/mL — AB (ref 0.0–100.0)

## 2017-05-05 SURGERY — BIV PACEMAKER INSERTION CRT-P

## 2017-05-05 MED ORDER — VANCOMYCIN HCL IN DEXTROSE 1-5 GM/200ML-% IV SOLN
1000.0000 mg | Freq: Two times a day (BID) | INTRAVENOUS | Status: AC
  Administered 2017-05-06: 1000 mg via INTRAVENOUS
  Filled 2017-05-05: qty 200

## 2017-05-05 MED ORDER — ONDANSETRON HCL 4 MG/2ML IJ SOLN: 4.0000 mg | Freq: Four times a day (QID) | INTRAMUSCULAR | Status: DC | PRN

## 2017-05-05 MED ORDER — METOPROLOL TARTRATE 25 MG PO TABS
25.0000 mg | ORAL_TABLET | Freq: Two times a day (BID) | ORAL | Status: DC
  Administered 2017-05-05 – 2017-05-06 (×2): 25 mg via ORAL
  Filled 2017-05-05 (×2): qty 1

## 2017-05-05 MED ORDER — MIDAZOLAM HCL 5 MG/5ML IJ SOLN
INTRAMUSCULAR | Status: AC
  Filled 2017-05-05: qty 5

## 2017-05-05 MED ORDER — MIDAZOLAM HCL 5 MG/5ML IJ SOLN
INTRAMUSCULAR | Status: DC | PRN
  Administered 2017-05-05 (×2): 1 mg via INTRAVENOUS

## 2017-05-05 MED ORDER — FENTANYL CITRATE (PF) 100 MCG/2ML IJ SOLN
INTRAMUSCULAR | Status: DC | PRN
  Administered 2017-05-05 (×2): 12.5 ug via INTRAVENOUS

## 2017-05-05 MED ORDER — SODIUM CHLORIDE 0.9 % IV SOLN: INTRAVENOUS | Status: DC

## 2017-05-05 MED ORDER — HEPARIN (PORCINE) IN NACL 2-0.9 UNIT/ML-% IJ SOLN
INTRAMUSCULAR | Status: AC | PRN
  Administered 2017-05-05: 500 mL

## 2017-05-05 MED ORDER — HEPARIN (PORCINE) IN NACL 2-0.9 UNIT/ML-% IJ SOLN
INTRAMUSCULAR | Status: AC
  Filled 2017-05-05: qty 500

## 2017-05-05 MED ORDER — ACETAMINOPHEN 325 MG PO TABS: 650.0000 mg | ORAL_TABLET | ORAL | Status: DC | PRN

## 2017-05-05 MED ORDER — SODIUM CHLORIDE 0.9 % IV SOLN: 250.0000 mL | INTRAVENOUS | Status: DC

## 2017-05-05 MED ORDER — IOPAMIDOL (ISOVUE-370) INJECTION 76%
INTRAVENOUS | Status: DC | PRN
  Administered 2017-05-05: 20 mL via INTRAVENOUS

## 2017-05-05 MED ORDER — ACETAMINOPHEN 325 MG PO TABS
325.0000 mg | ORAL_TABLET | ORAL | Status: DC | PRN
  Administered 2017-05-05 – 2017-05-06 (×2): 650 mg via ORAL
  Filled 2017-05-05 (×2): qty 2

## 2017-05-05 MED ORDER — IOPAMIDOL (ISOVUE-370) INJECTION 76%
INTRAVENOUS | Status: AC
  Filled 2017-05-05: qty 50

## 2017-05-05 MED ORDER — SODIUM CHLORIDE 0.9% FLUSH
3.0000 mL | Freq: Two times a day (BID) | INTRAVENOUS | Status: DC
  Administered 2017-05-05: 3 mL via INTRAVENOUS

## 2017-05-05 MED ORDER — NITROGLYCERIN 0.4 MG SL SUBL
0.4000 mg | SUBLINGUAL_TABLET | SUBLINGUAL | Status: DC | PRN
Start: 2017-05-05 — End: 2017-05-06

## 2017-05-05 MED ORDER — SODIUM CHLORIDE 0.9 % IR SOLN
Status: AC
  Filled 2017-05-05: qty 2

## 2017-05-05 MED ORDER — VANCOMYCIN HCL IN DEXTROSE 1-5 GM/200ML-% IV SOLN
INTRAVENOUS | Status: AC
  Filled 2017-05-05: qty 200

## 2017-05-05 MED ORDER — LIDOCAINE HCL 1 % IJ SOLN
INTRAMUSCULAR | Status: AC
  Filled 2017-05-05: qty 60

## 2017-05-05 MED ORDER — LIDOCAINE HCL (PF) 1 % IJ SOLN
INTRAMUSCULAR | Status: DC | PRN
  Administered 2017-05-05: 50 mL

## 2017-05-05 MED ORDER — HYDRALAZINE HCL 20 MG/ML IJ SOLN
10.0000 mg | Freq: Once | INTRAMUSCULAR | Status: AC
  Administered 2017-05-05: 10 mg via INTRAVENOUS
  Filled 2017-05-05: qty 1

## 2017-05-05 MED ORDER — SODIUM CHLORIDE 0.9 % IR SOLN: 80.0000 mg | Status: DC

## 2017-05-05 MED ORDER — VANCOMYCIN HCL IN DEXTROSE 1-5 GM/200ML-% IV SOLN
1000.0000 mg | INTRAVENOUS | Status: AC
  Administered 2017-05-05: 1000 mg via INTRAVENOUS

## 2017-05-05 MED ORDER — CHLORHEXIDINE GLUCONATE 4 % EX LIQD: 60.0000 mL | Freq: Once | CUTANEOUS | Status: AC

## 2017-05-05 MED ORDER — FENTANYL CITRATE (PF) 100 MCG/2ML IJ SOLN
INTRAMUSCULAR | Status: AC
  Filled 2017-05-05: qty 2

## 2017-05-05 MED ORDER — SODIUM CHLORIDE 0.9% FLUSH: 3.0000 mL | INTRAVENOUS | Status: DC | PRN

## 2017-05-05 MED ORDER — CHLORHEXIDINE GLUCONATE 4 % EX LIQD: 60.0000 mL | Freq: Once | CUTANEOUS | Status: DC

## 2017-05-05 SURGICAL SUPPLY — 15 items
ALLURE CRT PM3262 (Pacemaker) ×2 IMPLANT
CABLE SURGICAL S-101-97-12 (CABLE) ×2 IMPLANT
CATH CPS DIRECT 135 DS2C020 (CATHETERS) ×2 IMPLANT
CATH HEX JOSEPH 2-5-2 65CM 6F (CATHETERS) ×4 IMPLANT
CPS IMPLANT KIT 410190 (MISCELLANEOUS) ×2 IMPLANT
LEAD QUARTET 1458QL-86 (Lead) ×1 IMPLANT
LEAD TENDRIL MRI 52CM LPA1200M (Lead) ×2 IMPLANT
LEAD TENDRIL MRI 58CM LPA1200M (Lead) ×2 IMPLANT
PACEMAKER ALLURE CRT (Pacemaker) ×1 IMPLANT
PAD DEFIB LIFELINK (PAD) ×2 IMPLANT
QUARTET 1458QL-86 (Lead) ×2 IMPLANT
SHEATH CLASSIC 8F (SHEATH) ×4 IMPLANT
TRAY PACEMAKER INSERTION (PACKS) ×2 IMPLANT
WIRE ACUITY WHISPER EDS 4648 (WIRE) ×2 IMPLANT
WIRE LUGE 182CM (WIRE) ×2 IMPLANT

## 2017-05-05 NOTE — H&P (Signed)
Cardiology Admission History and Physical:   Patient ID: Madison White; MRN: [id number]; DOB: 04-20-1928   Admission date: 05/05/2017  Primary Care Provider: [name], DO Primary Cardiologist: Dr. Sallyanne Kuster Primary Electrophysiologist:  New today to Dr. Lovena Le  Chief Complaint:  Weakness, CP  Patient Profile:   Madison White is a 84 y.o. female with a history of NICM, LBBB, HTN, no known CAD  History of Present Illness:   Madison White was feeling very well until this morniong when she developed an unusual fatigue feeling of weakness, SOB and some degree of discomfort in her chest today, no syncope, no near syncope.  She locates her CP to the LUQ low left rib boarder and is described as discomfort, not positional or exertional.  She was found by EMS to be in CHB with rates 30's.  In the ER she remains in advanced heart block, 2;1 Is appreciated, V rates remain 30's.  She is hypertensive, 150-200 SBP  LABS BMET is pending WBC 7.6 H/H 13/39 Plts 223  Med list is reviewed, no nodal blocking/rate limiting agents   Past Medical History:  Diagnosis Date  . Diastolic dysfunction, left ventricle   . GERD (gastroesophageal reflux disease)   . 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  [name] History of blood transfusion   . History of rheumatic fever   . HTN (hypertension)    hx of refusing treatment--White coat HTN and/or situational HTN (?)   . Hyperlipidemia    hx of refusing treatment  . Insulin resistance    A1c's excellent per old records (6.1 in 2008 and 2009)  . Left bundle branch block 12/2011   Dr. Sallyanne Kuster at [facility]; ECHO and myocardial perfusion scan showed  septal and apical wall motion abnormality but she had no significant valvular disease  and no ischemia.  She did have mildly decreased EF (39% on lexiscan and 50% on echo) and abnl LV relaxation.  Mild amount of PVCs.  Pt at higher risk for other conduction abnormalities, good chance of eventually requiring a pacemaker.   [name] LVH (left ventricular hypertrophy)   . Nonischemic cardiomyopathy ([place]) 2016   LV dysfunction due to LBBB/septal dyssynchrony  . Osteoarthritis    bilat knees--needs bilat TKA.  Ortho is trying steroid injections as of 10/23/15.  . Seasonal allergies   . Solitary pulmonary nodule 05/2016   8 mm pleural based nodule in RLL.  Radiology recommended f/u noncontrast CT in 6-12 mo.    Past Surgical History:  Procedure Laterality Date  . ABDOMINAL HYSTERECTOMY  1963   At the time of her last C/S; says she had partial bladder resection at that time as well  . CARDIOVASCULAR STRESS TEST  08/2005 & 12/2011   Low risk scans (on the 12/2011 scan she did have EF 39% with moderately severe LV dysfunction with septal dyssynergy probably contributed by LBBB  . Carotid dopplers  11/22/14; 11/30/16   NORMAL 2016 and 2018  . CATARACT EXTRACTION  08/14/11   both  . CESAREAN SECTION  X 5   One of her neonates died soon after birth  . CHOLECYSTECTOMY     1980's  . JOINT REPLACEMENT Bilateral    knee  . right knee surgery  05/2016   ? R  TKA: no records.  [name] STRABISMUS SURGERY  1939  . TONSILLECTOMY  1938  . TRANSTHORACIC ECHOCARDIOGRAM  01/13/12   Septal dyssynergy, EF 50%, LV relaxation impaired.  No significant valvular abnormalities.     Medications Prior to Admission: Prior to Admission medications   Medication Sig Start Date End Date Taking? Authorizing Provider  acetaminophen (TYLENOL) 500 MG tablet Take 1,000 mg by mouth. 05/28/16   [provider]  aspirin 81 MG tablet Take 1 tablet (81 mg total) by mouth daily. 06/17/12   McGowen, Adrian Blackwater, MD  benzonatate (TESSALON PERLES) 100 MG capsule Take 1 capsule (100 mg total) by mouth 3 (three) times daily as needed  for cough. 12/18/16   [name], DO  Calcium Carbonate-Vitamin D (CALCIUM 600 + D PO) Take 1 tablet by mouth daily.    [provider]  Cyanocobalamin (VITAMIN B-12 CR PO) Take by mouth.    [provider]  fexofenadine (ALLEGRA) 180 MG tablet Take 180 mg by mouth daily.    [provider]  fish oil-omega-3 fatty acids 1000 MG capsule Take 2 g by mouth daily.    [provider]  ibuprofen (ADVIL,MOTRIN) 200 MG tablet Take 200 mg by mouth 2 (two) times daily.    [provider]  Multiple Vitamins-Minerals (CENTRUM PO) Take by mouth.    [provider]  nitrofurantoin, macrocrystal-monohydrate, (MACROBID) 100 MG capsule Take 1 capsule (100 mg total) by mouth 2 (two) times daily. 02/25/17   Shary Decamp, PA-C  oxyCODONE-acetaminophen (PERCOCET/ROXICET) 5-325 MG tablet Take 1 tablet by mouth every 6 (six) hours as needed for severe pain. 02/25/17   Shary Decamp, PA-C  vitamin C (ASCORBIC ACID) 500 MG tablet Take 500 mg by mouth.    [provider]  vitamin E 1000 UNIT capsule Take 1,000 Units by mouth daily.    [provider]     Allergies:    Allergies  Allergen Reactions  . Penicillins Hives and Swelling  . Nickel Swelling and Rash  . Avapro [Irbesartan] Other (See Comments)    Unknown rxn  . Lisinopril Swelling    Swelling around eyes; also says it caused increased sugar and BP  . Simvastatin Other (See Comments)    unknown  . Latex Rash  . Sulfa Antibiotics Rash  . Sulfamethoxazole Rash    Social History:   Social History   Socioeconomic History  . Marital status: Widowed    Spouse name: Not on file  . Number of children: Not on file  . Years of education: Not on file  . Highest education level: Not on file  Social Needs  . Financial resource strain: Not on file  . Food insecurity - worry: Not on file  . Food insecurity - inability: Not on file  . Transportation needs - medical: Not on file  .  Transportation needs - non-medical: Not on file  Occupational History  . Not on file  Tobacco Use  . Smoking status: Former Smoker    Last attempt to quit: 05/31/1958    Years since quitting: 58.9  . Smokeless tobacco: Never Used  Substance and Sexual Activity  . Alcohol use: Yes    Alcohol/week: 0.6 oz    Types: 1 Glasses of wine per week  . Drug use: No  . Sexual activity: Not on file  Other Topics Concern  . Not on file  Social History Narrative   Widower, husband died around 60 (wrongful death per pt's report).  Originally from [place], has been in [place] since about 1997.   Lives alone.  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   Fairly active and independent.   Distant hx of tobacco abuse.  No alcohol or drugs.    Family History:   The patient's family history includes Cancer in her father; Diabetes in her mother; Heart disease in her father and mother; Hyperlipidemia in her mother; Hypertension in her mother.    ROS:  Please see the history of present illness.  All other ROS reviewed and negative.     Physical Exam/Data:   Vitals:   05/05/17 1135  BP: (!) 152/88  Pulse: (!) 40  Resp: 11  Temp: 98 F (36.7 C)  TempSrc: Oral  SpO2: 100%   No intake or output data in the 24 hours ending 05/05/17 1243 HR - 37/min, BP - 207/55, R - 16 General:  Well nourished, well developed, in no acute distress HEENT: normal Lymph: no adenopathy Neck: 8 cm JVD Endocrine:  No thryomegaly Vascular: No carotid bruits Cardiac:  Reg; bradycardic, split S2, no murmurs, gallops or rubs Lungs:  CTA b/l, no wheezing, rhonchi or rales  Abd: soft, nontender  Ext: no edema Musculoskeletal:  No deformities, age appropriate atrophy Skin: warm and dry  Neuro:   No gross focal abnormalities noted Psych:  Normal affect    EKG:  The ECG that was done today was personally reviewed and demonstrates 2:1 heart block, 39bpm, LBBB, QRS 166ms  Relevant CV  Studies:  01/16/12: Lexiscan stress normal perfusion Moderately severe LV dysfunction, septal dyssynergy, probably contributed by LBBB Low risk  01/13/12: TTE LVEF 50%,mod conc LVH + WMA Mild MR, TR  Laboratory Data:  ChemistryNo results for input(s): NA, K, CL, CO2, GLUCOSE, BUN, CREATININE, CALCIUM, GFRNONAA, GFRAA, ANIONGAP in the last 168 hours.  No results for input(s): PROT, ALBUMIN, AST, ALT, ALKPHOS, BILITOT in the last 168 hours. HematologyNo results for input(s): WBC, RBC, HGB, HCT, MCV, MCH, MCHC, RDW, PLT in the last 168 hours. Cardiac EnzymesNo results for input(s): TROPONINI in the last 168 hours. No results for input(s): TROPIPOC in the last 168 hours.  BNPNo results for input(s): BNP, PROBNP in the last 168 hours.  DDimer No results for input(s): DDIMER in the last 168 hours.  Radiology/Studies:   Dg Chest Portable 1 View Result Date: 05/05/2017 CLINICAL DATA:  Shortness of Breath EXAM: PORTABLE CHEST 1 VIEW COMPARISON:  06/12/2016 FINDINGS: Cardiac shadow is enlarged. Aortic calcifications are again seen. The lungs are clear bilaterally. Large skin fold is noted over the left lung. No bony abnormality is seen. IMPRESSION: No active disease. Electronically Signed   By: Inez Catalina M.D.   On: 05/05/2017 12:28    Assessment and Plan:   1. CHB, advanced heart block     No reversible causes noted 2. Symptomatic bradycardia 3. Baseline conduction system disease, with known LBBB, 1st degree AVBlock, LAD on old EKGs  Dr. Lovena Le has seen and examined the patient, recommend PPM implant The procedure, it's risks and benefits were discussed with the patient and family with her in the ER.  The patient (and family) are agreeable to proceed.  3. HTN     Expect improvement post pacing     Not on antihypertensive meds put patient, reported to have "white coat syndrome"     follow post pacing   For questions or updates, please contact [facility] Please consult  [email or web address] for contact info under  Cardiology/STEMI.    Signed, Baldwin Jamaica, PA-C  05/05/2017 12:43 PM   EP Attending  Patient seen and examined. Agree with the fijndings as noted above with minimal modification. The patient has a h/o CHB alternating with symptomatic 2:1 AV block and is on no medical therapy. Because she has a h/o LV dysfunction and chronic LBBB and remote rheumatic fever as a child, I have recommended proceeding with BiV PPM insertion. The patient has experienced atypical chest pain with no obvious ischemia. I would antincipate a lexiscan myoview after her heart cath.   Mikle Bosworth.D.

## 2017-05-05 NOTE — ED Notes (Signed)
No c/o chest pain today , just c/ol low back pain

## 2017-05-05 NOTE — Discharge Summary (Addendum)
ELECTROPHYSIOLOGY PROCEDURE DISCHARGE SUMMARY    Patient ID: Madison White,  MRN: [id number], DOB/AGE: 11/07/1928 84 y.o.  Admit date: 05/05/2017 Discharge date: 05/06/17  Primary Care Physician: [name], DO  Primary Cardiologist: Dr. Sallyanne Kuster Electrophysiologist: Dr. Lovena Le  Primary Discharge Diagnosis:  1. CHB  Secondary Discharge Diagnosis:  1. NICM 2. LBBB 3. HTN ("white coat syndrome")  Allergies  Allergen Reactions  . Penicillins Hives and Swelling  . Nickel Swelling and Rash  . Avapro [Irbesartan] Other (See Comments)    Unknown rxn  . Lisinopril Swelling    Swelling around eyes; also says it caused increased sugar and BP  . Simvastatin Other (See Comments)    unknown  . Latex Rash  . Sulfa Antibiotics Rash  . Sulfamethoxazole Rash     Procedures This Admission:  1.  Implantation of a SJM dual chamber PPM on 05/05/17 by Dr Lovena Le.  The patient received a Comptroller model [id number] (serial number [id number]) right atrial lead and a [employer] model [id number] (serial number [id number]) right ventricular lead, St. Jude(serial number [id number]) lead,  St. Jude (serial number [id number]) pacemaker There were no immediate post procedure complications. 2.  CXR on 05/06/17 demonstrated no pneumothorax status post device implantation.   Brief HPI: Madison White is a 84 y.o. female sought medical attention via EMS the day of admission when she developed an unusual fatigue feeling of weakness, SOB and some degree of discomfort in her chest today, no syncope, no near syncope.  She locates her CP to the LUQ low left rib boarder and is described as discomfort, not positional or exertional.  She was found in CHB w/V rates 30's   Hospital Course:  The patient was admitted and underwent implantation of a CRT-P with details as outlined above. She was monitored on telemetry overnight which demonstrated SR/V paced rhythm.  Left chest was without hematoma or  ecchymosis.  The device was interrogated and found to intact function.  CXR was obtained and demonstrated no pneumothorax status post device implantation.  The patient has no SOB, pulmonary exam is clear,  Wound care, arm mobility, and restrictions were reviewed with the patient and daughter at bedside.   Patient has had HTN here, lengthy discussion regarding this.  Reportedly a daughter and granddaughter who are health care providers monitor home BP and are reported to be "normal", and flet to have "white coat syndrome".  Will Rx lopressor she has been getting  Here.  They will keep record at home, notify if any BP concnerns. The patient feels well, is without c/o CP, palpitations, or SOB, she was examined by Dr. Lovena Le and considered stable for discharge to home.    Physical Exam: Vitals:   05/06/17 0410 05/06/17 0425 05/06/17 0701 05/06/17 0709  BP: (!) 193/64 (!) 175/55 (!) 180/161 (!) 173/69  Pulse:   83 72  Resp: (!) 21 (!) 21  18  Temp:    98.4 F (36.9 C)  TempSrc:    Oral  SpO2:    93%  Weight:        GEN- The patient is well appearing, alert and oriented x 3 today.   HEENT: normocephalic, atraumatic; sclera clear, conjunctiva pink; hearing intact; oropharynx clear; neck supple, no JVP Lungs- CTA b/l, normal work of breathing.  No wheezes, rales, rhonchi Heart- RRR, no murmurs, rubs or gallops, PMI not laterally displaced GI- soft, non-tender, non-distended Extremities- no clubbing, cyanosis, or edema MS- no significant deformity  or atrophy Skin- warm and dry, no rash or lesion,  left chest without hematoma/ecchymosis Psych- euthymic mood, full affect Neuro- no gross deficits   Labs:   Lab Results  Component Value Date   WBC 7.6 05/05/2017   HGB 13.0 05/05/2017   HCT 39.0 05/05/2017   MCV 87.2 05/05/2017   PLT 223 05/05/2017    Recent Labs  Lab 05/05/17 1204  NA 142  K 4.1  CL 108  CO2 24  BUN 14  CREATININE 0.87  CALCIUM 8.9  PROT 6.0*  BILITOT 0.6  ALKPHOS  40  ALT 13*  AST 19  GLUCOSE 102*    Discharge Medications:  Allergies as of 05/06/2017      Reactions   Penicillins Hives, Swelling   Nickel Swelling, Rash   Avapro [irbesartan] Other (See Comments)   Unknown rxn   Lisinopril Swelling   Swelling around eyes; also says it caused increased sugar and BP   Simvastatin Other (See Comments)   unknown   Latex Rash   Sulfa Antibiotics Rash   Sulfamethoxazole Rash      Medication List    TAKE these medications   acetaminophen 500 MG tablet Commonly known as:  TYLENOL Take 1,000 mg by mouth.   aspirin 81 MG tablet Take 1 tablet (81 mg total) by mouth daily.   benzonatate 100 MG capsule Commonly known as:  TESSALON PERLES Take 1 capsule (100 mg total) by mouth 3 (three) times daily as needed for cough.   CALCIUM 600 + D PO Take 1 tablet by mouth daily.   AIRBORNE PO Take 1 tablet by mouth daily as needed (for immune).   CENTRUM PO Take by mouth.   cholecalciferol 1000 units tablet Commonly known as:  VITAMIN D Take 1,000 Units by mouth daily.   fexofenadine 180 MG tablet Commonly known as:  ALLEGRA Take 180 mg by mouth daily.   fish oil-omega-3 fatty acids 1000 MG capsule Take 2 g by mouth daily.   hydroxypropyl methylcellulose / hypromellose 2.5 % ophthalmic solution Commonly known as:  ISOPTO TEARS / GONIOVISC Place 1 drop into both eyes 2 (two) times daily as needed for dry eyes.   ibuprofen 200 MG tablet Commonly known as:  ADVIL,MOTRIN Take 200 mg by mouth 2 (two) times daily.   metoprolol tartrate 25 MG tablet Commonly known as:  LOPRESSOR Take 1 tablet (25 mg total) by mouth 2 (two) times daily.   nitrofurantoin (macrocrystal-monohydrate) 100 MG capsule Commonly known as:  MACROBID Take 1 capsule (100 mg total) by mouth 2 (two) times daily.   oxyCODONE-acetaminophen 5-325 MG tablet Commonly known as:  PERCOCET/ROXICET Take 1 tablet by mouth every 6 (six) hours as needed for severe pain.     VITAMIN B-12 CR PO Take by mouth.   vitamin C 500 MG tablet Commonly known as:  ASCORBIC ACID Take 500 mg by mouth.   vitamin E 1000 UNIT capsule Take 1,000 Units by mouth daily.       Disposition:  Home  Discharge Instructions    Diet - low sodium heart healthy   Complete by:  As directed    Increase activity slowly   Complete by:  As directed      Follow-up Information    [place] [facility] Follow up on 05/15/2017.   Specialty:  Cardiology Why:  2:00PM, wound check visit Contact information: [address] [place],  Champ Mungo, MD Follow up on 08/03/2017.   Specialty:  Cardiology Why:  9:30AM Contact information: [address] [address] [phone]           Duration of Discharge Encounter: Greater than 30 minutes including physician time.  Venetia Night, PA-C 05/06/2017 9:40 AM  EP attending  Patient seen and examined.  Agree with the findings as noted above.  The patient is status post insertion of a biventricular pacemaker secondary to complete heart block as well as left ventricular dysfunction and baseline left bundle branch block EF 40-50%.  She is stable today.  Her pacemaker has been interrogated under my direct supervision and demonstrates increased left ventricular threshold.  Review of her chest x-ray demonstrates that the lead has retracted slightly but still in the lateral vein.  Her atrial lead is also retracted some but is also working normally.  She will be discharged home with usual follow-up.  Crissie Sickles, MD

## 2017-05-05 NOTE — ED Notes (Signed)
Cards at bedside

## 2017-05-05 NOTE — ED Triage Notes (Signed)
Pt here from home with c/o sob and back pain , pt was found to be in third degree heart block

## 2017-05-05 NOTE — Interval H&P Note (Signed)
History and Physical Interval Note:  05/05/2017 4:42 PM  Madison White  has presented today for surgery, with the diagnosis of Complete Heart Block  The various methods of treatment have been discussed with the patient and family. After consideration of risks, benefits and other options for treatment, the patient has consented to  Procedure(s): BIV PACEMAKER INSERTION CRT-P (N/A) as a surgical intervention .  The patient's history has been reviewed, patient examined, no change in status, stable for surgery.  I have reviewed the patient's chart and labs.  Questions were answered to the patient's satisfaction.     Cristopher Peru

## 2017-05-05 NOTE — ED Provider Notes (Signed)
[facility] EMERGENCY DEPARTMENT Provider Note   CSN: [id number] Arrival date & time: 05/05/17  1131     History   Chief Complaint Chief Complaint  Patient presents with  . Bradycardia    HPI Madison White is a 84 y.o. female.  HPI   Patient  is an 84 year old female presenting with fatigue.  Patient's been feeling fatigued since yesterday.  She also reports a little bit of back pain.  She thought it might be a kidney stone.  However when she got up this morning she felt very shortness of breath when walking to the kitchen to get her cereal.  This brought her and thought that it was a complete heart block.  Of note patient has history of rheumatic fever.  Not on any blood pressure medications.  Past Medical History:  Diagnosis Date  . Diastolic dysfunction, left ventricle   . GERD (gastroesophageal reflux disease)   . 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  [name] History of blood transfusion   . History of rheumatic fever   . HTN (hypertension)    hx of refusing treatment--White coat HTN and/or situational HTN (?)   . Hyperlipidemia    hx of refusing treatment  . Insulin resistance    A1c's excellent per old records (6.1 in 2008 and 2009)  . Left bundle branch block 12/2011   Dr. Sallyanne Kuster at [facility]; ECHO and myocardial perfusion scan showed  septal and apical wall motion abnormality but she had no significant valvular disease and no ischemia.  She did have mildly decreased EF (39% on lexiscan and 50% on echo) and abnl LV relaxation.  Mild amount of PVCs.  Pt at higher risk for other conduction abnormalities, good chance of eventually requiring a pacemaker.   [name] LVH (left ventricular hypertrophy)   . Nonischemic cardiomyopathy ([place])  2016   LV dysfunction due to LBBB/septal dyssynchrony  . Osteoarthritis    bilat knees--needs bilat TKA.  Ortho is trying steroid injections as of 10/23/15.  . Seasonal allergies   . Solitary pulmonary nodule 05/2016   8 mm pleural based nodule in RLL.  Radiology recommended f/u noncontrast CT in 6-12 mo.    Patient Active Problem List   Diagnosis Date Noted  . Nonischemic cardiomyopathy ([place]) 11/17/2014  . Health maintenance examination 02/26/2014  . Upper airway cough syndrome 02/26/2014  . Constipation, chronic 02/26/2014  . GERD (gastroesophageal reflux disease) 11/19/2012  . Dyslipidemia 09/02/2012  . Left bundle branch block 12/18/2011  . HTN (hypertension), benign 05/31/2011    Past Surgical History:  Procedure Laterality Date  . ABDOMINAL HYSTERECTOMY  1963   At the time of her last C/S; says she had partial bladder resection at that time as well  . CARDIOVASCULAR STRESS TEST  08/2005 & 12/2011   Low risk scans (on the 12/2011 scan she did have EF 39% with moderately severe LV dysfunction with septal dyssynergy probably contributed by LBBB  . Carotid dopplers  11/22/14; 11/30/16   NORMAL 2016 and 2018  . CATARACT EXTRACTION  08/14/11   both  . CESAREAN SECTION  X 5   One of her neonates died soon after birth  . CHOLECYSTECTOMY     1980's  . JOINT REPLACEMENT Bilateral  knee  . right knee surgery  05/2016   ? R TKA: no records.  [name] STRABISMUS SURGERY  1939  . TONSILLECTOMY  1938  . TRANSTHORACIC ECHOCARDIOGRAM  01/13/12   Septal dyssynergy, EF 50%, LV relaxation impaired.  No significant valvular abnormalities.    OB History    No data available       Home Medications    Prior to Admission medications   Medication Sig Start Date End Date Taking? Authorizing Provider  acetaminophen (TYLENOL) 500 MG tablet Take 1,000 mg by mouth. 05/28/16   [provider]  aspirin 81 MG tablet Take 1 tablet (81 mg total) by mouth daily. 06/17/12   McGowen, Adrian Blackwater, MD    benzonatate (TESSALON PERLES) 100 MG capsule Take 1 capsule (100 mg total) by mouth 3 (three) times daily as needed for cough. 12/18/16   [name], DO  Calcium Carbonate-Vitamin D (CALCIUM 600 + D PO) Take 1 tablet by mouth daily.    [provider]  Cyanocobalamin (VITAMIN B-12 CR PO) Take by mouth.    [provider]  fexofenadine (ALLEGRA) 180 MG tablet Take 180 mg by mouth daily.    [provider]  fish oil-omega-3 fatty acids 1000 MG capsule Take 2 g by mouth daily.    [provider]  ibuprofen (ADVIL,MOTRIN) 200 MG tablet Take 200 mg by mouth 2 (two) times daily.    [provider]  Multiple Vitamins-Minerals (CENTRUM PO) Take by mouth.    [provider]  nitrofurantoin, macrocrystal-monohydrate, (MACROBID) 100 MG capsule Take 1 capsule (100 mg total) by mouth 2 (two) times daily. 02/25/17   Shary Decamp, PA-C  oxyCODONE-acetaminophen (PERCOCET/ROXICET) 5-325 MG tablet Take 1 tablet by mouth every 6 (six) hours as needed for severe pain. 02/25/17   Shary Decamp, PA-C  vitamin C (ASCORBIC ACID) 500 MG tablet Take 500 mg by mouth.    [provider]  vitamin E 1000 UNIT capsule Take 1,000 Units by mouth daily.    [provider]    Family History Family History  Problem Relation Age of Onset  . Hypertension Mother   . Heart disease Mother   . Hyperlipidemia Mother   . Diabetes Mother   . Heart disease Father   . Cancer Father     Social History Social History   Tobacco Use  . Smoking status: Former Smoker    Last attempt to quit: 05/31/1958    Years since quitting: 58.9  . Smokeless tobacco: Never Used  Substance Use Topics  . Alcohol use: Yes    Alcohol/week: 0.6 oz    Types: 1 Glasses of wine per week  . Drug use: No     Allergies   Penicillins; Nickel; Avapro [irbesartan]; Lisinopril; Simvastatin; Latex; Sulfa antibiotics; and Sulfamethoxazole   Review of Systems Review of Systems   Constitutional: Positive for fatigue. Negative for activity change and fever.  Respiratory: Positive for shortness of breath.   Cardiovascular: Negative for chest pain.  Gastrointestinal: Negative for abdominal pain.  Musculoskeletal: Positive for back pain.     Physical Exam Updated Vital Signs BP (!) 152/88   Pulse (!) 40   Temp 98 F (36.7 C) (Oral)   Resp 11   SpO2 100%   Physical Exam  Constitutional: She is oriented to person, place, and time. She appears well-developed and well-nourished.  HENT:  Head: Normocephalic and atraumatic.  Eyes: Right eye exhibits no discharge. Left eye exhibits no discharge.  Cardiovascular:  No murmur heard. HR 38, subtle murmur  Pulmonary/Chest: Effort normal and breath sounds normal. She has no wheezes. She has no rales.  Abdominal: Soft. She exhibits no distension. There is no tenderness.  Neurological: She is oriented to person, place, and time.  Skin: Skin is warm and dry. She is not diaphoretic.  Psychiatric: She has a normal mood and affect.  Nursing note and vitals reviewed.    ED Treatments / Results  Labs (all labs ordered are listed, but only abnormal results are displayed) Labs Reviewed  CBC WITH DIFFERENTIAL/PLATELET  COMPREHENSIVE METABOLIC PANEL  URINALYSIS, ROUTINE W REFLEX MICROSCOPIC  BRAIN NATRIURETIC PEPTIDE  I-STAT TROPONIN, ED    EKG  EKG Interpretation  Date/Time:  Tuesday May 05 2017 11:38:08 EST Ventricular Rate:  39 PR Interval:    QRS Duration: 157 QT Interval:  534 QTC Calculation: 431 R Axis:   -65 Text Interpretation:  Sinus bradycardia Prolonged PR interval Left bundle branch block bradycardia new from previous Confirmed by Theotis Burrow [phone]) on 05/06/2017 2:41:31 PM       Radiology No results found.  Procedures Procedures (including critical care time)  CRITICAL CARE Performed by: Gardiner Sleeper Total critical care time: 60 minutes Critical care time was exclusive of  separately billable procedures and treating other patients. Critical care was necessary to treat or prevent imminent or life-threatening deterioration. Critical care was time spent personally by me on the following activities: development of treatment plan with patient and/or surrogate as well as nursing, discussions with consultants, evaluation of patient's response to treatment, examination of patient, obtaining history from patient or surrogate, ordering and performing treatments and interventions, ordering and review of laboratory studies, ordering and review of radiographic studies, pulse oximetry and re-evaluation of patient's condition.   Medications Ordered in ED Medications - No data to display   Initial Impression / Assessment and Plan / ED Course  I have reviewed the triage vital signs and the nursing notes.  Pertinent labs & imaging results that were available during my care of the patient were reviewed by me and considered in my medical decision making (see chart for details).     Patient  is an 84 year old female presenting with fatigue.  Patient's been feeling fatigued since yesterday.  She also reports a little bit of back pain.  She thought it might be a kidney stone.  However when she got up this morning she felt very shortness of breath when walking to the kitchen to get her cereal.  This brought her and thought that it was a complete heart block.  Of note patient has history of rheumatic fever.  Not on any blood pressure medications.  12:09 PM Very pleasant 84 year old female presenting with a heart rate in the 30s.  EMS felt that it was a complete heart rate.  However on her EKG here it appears that the P waves are followed by QRS.  Final Clinical Impressions(s) / ED Diagnoses   Final diagnoses:  None    ED Discharge Orders    None       Blaike Vickers, Fredia Sorrow, MD 05/09/17 1501

## 2017-05-06 ENCOUNTER — Observation Stay (HOSPITAL_COMMUNITY): Payer: Medicare HMO

## 2017-05-06 ENCOUNTER — Encounter (HOSPITAL_COMMUNITY): Payer: Self-pay | Admitting: Internal Medicine

## 2017-05-06 ENCOUNTER — Telehealth: Payer: Self-pay | Admitting: Physician Assistant

## 2017-05-06 DIAGNOSIS — I110 Hypertensive heart disease with heart failure: Secondary | ICD-10-CM | POA: Diagnosis not present

## 2017-05-06 DIAGNOSIS — I428 Other cardiomyopathies: Secondary | ICD-10-CM | POA: Diagnosis not present

## 2017-05-06 DIAGNOSIS — R911 Solitary pulmonary nodule: Secondary | ICD-10-CM | POA: Diagnosis not present

## 2017-05-06 DIAGNOSIS — J90 Pleural effusion, not elsewhere classified: Secondary | ICD-10-CM | POA: Diagnosis not present

## 2017-05-06 DIAGNOSIS — E785 Hyperlipidemia, unspecified: Secondary | ICD-10-CM | POA: Diagnosis not present

## 2017-05-06 DIAGNOSIS — M170 Bilateral primary osteoarthritis of knee: Secondary | ICD-10-CM | POA: Diagnosis not present

## 2017-05-06 DIAGNOSIS — I509 Heart failure, unspecified: Secondary | ICD-10-CM | POA: Diagnosis not present

## 2017-05-06 DIAGNOSIS — I447 Left bundle-branch block, unspecified: Secondary | ICD-10-CM | POA: Diagnosis not present

## 2017-05-06 DIAGNOSIS — I442 Atrioventricular block, complete: Secondary | ICD-10-CM | POA: Diagnosis not present

## 2017-05-06 DIAGNOSIS — K219 Gastro-esophageal reflux disease without esophagitis: Secondary | ICD-10-CM | POA: Diagnosis not present

## 2017-05-06 DIAGNOSIS — Z7982 Long term (current) use of aspirin: Secondary | ICD-10-CM | POA: Diagnosis not present

## 2017-05-06 LAB — URINALYSIS, ROUTINE W REFLEX MICROSCOPIC
BILIRUBIN URINE: NEGATIVE
GLUCOSE, UA: NEGATIVE mg/dL
HGB URINE DIPSTICK: NEGATIVE
Ketones, ur: 5 mg/dL — AB
LEUKOCYTES UA: NEGATIVE
Nitrite: NEGATIVE
PROTEIN: 100 mg/dL — AB
Specific Gravity, Urine: 1.03 (ref 1.005–1.030)
pH: 5 (ref 5.0–8.0)

## 2017-05-06 LAB — SURGICAL PCR SCREEN
MRSA, PCR: NEGATIVE
Staphylococcus aureus: NEGATIVE

## 2017-05-06 MED ORDER — YOU HAVE A PACEMAKER BOOK
Freq: Once | Status: AC
  Administered 2017-05-06: 03:00:00
  Filled 2017-05-06: qty 1

## 2017-05-06 MED ORDER — HYDRALAZINE HCL 20 MG/ML IJ SOLN
10.0000 mg | Freq: Four times a day (QID) | INTRAMUSCULAR | Status: DC | PRN
  Administered 2017-05-06: 03:00:00 10 mg via INTRAVENOUS
  Filled 2017-05-06: qty 1

## 2017-05-06 MED ORDER — METOPROLOL TARTRATE 25 MG PO TABS: 25.0000 mg | ORAL_TABLET | Freq: Two times a day (BID) | ORAL | 6 refills | Status: DC

## 2017-05-06 MED FILL — Heparin Sodium (Porcine) 2 Unit/ML in Sodium Chloride 0.9%: INTRAMUSCULAR | Qty: 1000 | Status: AC

## 2017-05-06 MED FILL — Gentamicin Sulfate Inj 40 MG/ML: INTRAMUSCULAR | Qty: 80 | Status: AC

## 2017-05-06 NOTE — Telephone Encounter (Signed)
Paged by answering service, the patient having allergic reaction to her face.  She had a pacemaker placement yesterday.  No other cardiac symptoms.  She denies chest pain, shortness of breath, palpitation, orthopnea, PND, syncope, dizziness or melena.  The pacemaker site looks good.  Advised to take Benadryl.  If no improvement call office in the morning after 8 AM.  If worsening breathing overnight or worsening of rest encouraged to go to the ER.  Agree with plan and thanked for call.

## 2017-05-06 NOTE — Discharge Instructions (Signed)
° ° °  Supplemental Discharge Instructions for  Pacemaker/Defibrillator Patients  Activity No heavy lifting or vigorous activity with your left/right arm for 6 to 8 weeks.  Do not raise your left/right arm above your head for one week.  Gradually raise your affected arm as drawn below.              05/08/17                    05/09/17                    05/10/17                   05/11/17 __  NO DRIVING patient does not drive.  WOUND CARE - Keep the wound area clean and dry.  Do not get this area wet for one week. No showers for one week; you may shower on  05/12/17   . - The tape/steri-strips on your wound will fall off; do not pull them off.  No bandage is needed on the site.  DO  NOT apply any creams, oils, or ointments to the wound area. - If you notice any drainage or discharge from the wound, any swelling or bruising at the site, or you develop a fever > 101? F after you are discharged home, call the office at once.  Special Instructions - You are still able to use cellular telephones; use the ear opposite the side where you have your pacemaker/defibrillator.  Avoid carrying your cellular phone near your device. - When traveling through airports, show security personnel your identification card to avoid being screened in the metal detectors.  Ask the security personnel to use the hand wand. - Avoid arc welding equipment, MRI testing (magnetic resonance imaging), TENS units (transcutaneous nerve stimulators).  Call the office for questions about other devices. - Avoid electrical appliances that are in poor condition or are not properly grounded. - Microwave ovens are safe to be near or to operate.  Additional information for defibrillator patients should your device go off: - If your device goes off ONCE and you feel fine afterward, notify the device clinic nurses. - If your device goes off ONCE and you do not feel well afterward, call 911. - If your device goes off TWICE, call 911. - If  your device goes off THREE times in one day, call 911.  DO NOT DRIVE YOURSELF OR A FAMILY MEMBER WITH A DEFIBRILLATOR TO THE HOSPITAL--CALL 911.

## 2017-05-07 ENCOUNTER — Telehealth: Payer: Self-pay | Admitting: Physician Assistant

## 2017-05-07 ENCOUNTER — Telehealth: Payer: Self-pay | Admitting: Internal Medicine

## 2017-05-07 DIAGNOSIS — I10 Essential (primary) hypertension: Secondary | ICD-10-CM

## 2017-05-07 NOTE — Telephone Encounter (Signed)
New message     Patient daughter states they never received prescription to fill at the hospital Daughter- Letanya Froh [phone]   *STAT* If patient is at the pharmacy, call can be transferred to refill team.   1. Which medications need to be refilled? (please list name of each medication and dose if known) nitrofurantoin, macrocrystal-monohydrate, (MACROBID) 100   2. Which pharmacy/location (including street and city if local pharmacy) is medication to be sent to? [facility] [place] 3. Do they need a 30 day or 90 day supply?

## 2017-05-07 NOTE — Telephone Encounter (Signed)
Attempted to return phone call, left voicemail. Per the chart, it does not appear that antibiotics were prescribed at discharge.

## 2017-05-08 MED ORDER — NITROFURANTOIN MONOHYD MACRO 100 MG PO CAPS: 100.0000 mg | ORAL_CAPSULE | Freq: Two times a day (BID) | ORAL | 3 refills | Status: DC

## 2017-05-08 NOTE — Telephone Encounter (Signed)
Macrobid refilled as requested by family.  Family notified.  No further action needed.

## 2017-05-15 ENCOUNTER — Ambulatory Visit: Payer: Self-pay

## 2017-05-20 ENCOUNTER — Ambulatory Visit (INDEPENDENT_AMBULATORY_CARE_PROVIDER_SITE_OTHER): Payer: Medicare HMO | Admitting: *Deleted

## 2017-05-20 DIAGNOSIS — I447 Left bundle-branch block, unspecified: Secondary | ICD-10-CM | POA: Diagnosis not present

## 2017-05-20 DIAGNOSIS — I428 Other cardiomyopathies: Secondary | ICD-10-CM | POA: Diagnosis not present

## 2017-05-20 DIAGNOSIS — I442 Atrioventricular block, complete: Secondary | ICD-10-CM | POA: Diagnosis not present

## 2017-05-20 LAB — CUP PACEART INCLINIC DEVICE CHECK
Battery Remaining Longevity: 50 mo
Brady Statistic RV Percent Paced: 98 %
Implantable Lead Implant Date: 20190219
Implantable Lead Location: 753858
Implantable Pulse Generator Implant Date: 20190219
Lead Channel Impedance Value: 525 Ohm
Lead Channel Impedance Value: 587.5 Ohm
Lead Channel Impedance Value: 737.5 Ohm
Lead Channel Pacing Threshold Amplitude: 0.5 V
Lead Channel Pacing Threshold Amplitude: 0.75 V
Lead Channel Pacing Threshold Amplitude: 0.75 V
Lead Channel Pacing Threshold Amplitude: 1 V
Lead Channel Pacing Threshold Pulse Width: 0.5 ms
Lead Channel Pacing Threshold Pulse Width: 0.5 ms
Lead Channel Pacing Threshold Pulse Width: 0.5 ms
Lead Channel Pacing Threshold Pulse Width: 0.8 ms
Lead Channel Sensing Intrinsic Amplitude: 2 mV
Lead Channel Setting Pacing Amplitude: 3.5 V
Lead Channel Setting Pacing Amplitude: 3.5 V
Lead Channel Setting Pacing Pulse Width: 0.5 ms
MDC IDC LEAD IMPLANT DT: 20190219
MDC IDC LEAD IMPLANT DT: 20190219
MDC IDC LEAD LOCATION: 753859
MDC IDC LEAD LOCATION: 753860
MDC IDC MSMT BATTERY VOLTAGE: 3.04 V
MDC IDC MSMT LEADCHNL LV PACING THRESHOLD AMPLITUDE: 1 V
MDC IDC MSMT LEADCHNL LV PACING THRESHOLD PULSEWIDTH: 0.8 ms
MDC IDC MSMT LEADCHNL RV PACING THRESHOLD AMPLITUDE: 0.5 V
MDC IDC MSMT LEADCHNL RV PACING THRESHOLD PULSEWIDTH: 0.5 ms
MDC IDC MSMT LEADCHNL RV SENSING INTR AMPL: 12 mV
MDC IDC PG SERIAL: 8995296
MDC IDC SESS DTM: 20190306154413
MDC IDC SET LEADCHNL LV PACING AMPLITUDE: 3.5 V
MDC IDC SET LEADCHNL LV PACING PULSEWIDTH: 0.8 ms
MDC IDC SET LEADCHNL RV SENSING SENSITIVITY: 4 mV
MDC IDC STAT BRADY RA PERCENT PACED: 5.5 %

## 2017-05-20 NOTE — Progress Notes (Signed)
Wound check appointment. Steri-strips removed. Wound without redness or edema. Incision edges approximated, wound well healed. Normal device function. Thresholds, sensing, and impedances consistent with implant measurements. Device programmed at 3.5V for extra safety margin until 3 month visit. Histogram distribution appropriate for patient and level of activity. 98% Biv pacing. 4 mode switches- longest 10 sec, AT. No high ventricular rates noted. Patient educated about wound care, arm mobility, lifting restrictions and Merlin monitoring. ROV with GT 08/03/17.

## 2017-06-16 ENCOUNTER — Telehealth: Payer: Self-pay | Admitting: Internal Medicine

## 2017-06-16 NOTE — Telephone Encounter (Signed)
Spoke with pts daughter informed her that it was unlikely this was a pacemaker problem due to the fact that it was both hands, if it was a pacemaker issue it would only affect one side. Pts daughter stated that incision site looks fine, asked that they send in a remote transmission just to check pacemaker, informed pts daughter that is she did not hear anything from the device clinic then the pacemaker is functioning fine, encouraged pts daughter to follow up with pt's primary care doctor as there could be some circulation issues.

## 2017-06-16 NOTE — Telephone Encounter (Signed)
Spoke with patient's daughter who is concerned about her mother's hands turning black & blue/ and then back to flesh color periodically throughout the day..    She had a pacemaker placed about 6 weeks ago by Dr Lovena Le and was wondering if this is playing a role..  The patient has no pain or swelling..  It looks like they are terribly bruised at times..  Please advise, thank you.[name]

## 2017-06-16 NOTE — Telephone Encounter (Signed)
°  1. Has your device fired? no 2. Is you device beeping? no 3. Are you experiencing draining or swelling at device site? no 4. Are you calling to see if we received your device transmission? no 5. Have you passed out? No   Pt hands are turning blue in both hands  Madison White verbalized that this is a triage call      Please route to [place]

## 2017-06-17 ENCOUNTER — Encounter: Payer: Self-pay | Admitting: Family Medicine

## 2017-06-17 ENCOUNTER — Ambulatory Visit (INDEPENDENT_AMBULATORY_CARE_PROVIDER_SITE_OTHER): Payer: Medicare HMO | Admitting: Family Medicine

## 2017-06-17 VITALS — BP 118/80 | HR 66 | Temp 97.6°F | Wt 139.0 lb

## 2017-06-17 DIAGNOSIS — N200 Calculus of kidney: Secondary | ICD-10-CM | POA: Insufficient documentation

## 2017-06-17 DIAGNOSIS — R230 Cyanosis: Secondary | ICD-10-CM | POA: Diagnosis not present

## 2017-06-17 DIAGNOSIS — I878 Other specified disorders of veins: Secondary | ICD-10-CM | POA: Diagnosis not present

## 2017-06-17 NOTE — Patient Instructions (Signed)
Your blue discoloration is due to prominent veins and thin skin. You have good blood flow.  Take care, Dr Jerline Pain

## 2017-06-17 NOTE — Progress Notes (Signed)
   Subjective:  Madison White is a 84 y.o. female who presents today for same-day appointment with a chief complaint of skin discoloration.   HPI:  Skin Discoloration, New Problem Started about a week ago. Patient notices a blue discoloration in her hands periodically. Thinks that she may have lifted something too heavy that caused her symptoms.  No pain in the area.  Discoloration is in her bilateral hands.  They would turn blue for 1-2 hours and then resolve spontaneously.  No numbness.  No swelling.  No treatments tried.  ROS: Per HPI  PMH: She reports that she quit smoking about 59 years ago. She has never used smokeless tobacco. She reports that she drinks about 0.6 oz of alcohol per week. She reports that she does not use drugs.  Objective:  Physical Exam: BP 118/80 (BP Location: Left Arm)   Pulse 66   Temp 97.6 F (36.4 C) (Oral)   Wt 139 lb (63 kg)   SpO2 97%   BMI 27.15 kg/m   Gen: NAD, resting comfortably Skin: Prominent veins noted in hands bilaterally. MSK: Radial and ulnar pulses 2+ and symmetric bilaterally.  Good distal cap refill.  Strength 5 out of 5 in hands bilaterally.  Sensation light touch intact throughout both hands.  Assessment/Plan:  Skin Discoloration Likely secondary to thinning of her skin and prominent veins.  She has good distal pulses and cap refill. Her neurological exam is normal. History not consistent with Raynaud's. Reassured patient.  Algis Greenhouse. Jerline Pain, MD 06/17/2017 11:39 AM

## 2017-06-19 DIAGNOSIS — N200 Calculus of kidney: Secondary | ICD-10-CM | POA: Diagnosis not present

## 2017-06-19 DIAGNOSIS — N281 Cyst of kidney, acquired: Secondary | ICD-10-CM | POA: Diagnosis not present

## 2017-07-14 ENCOUNTER — Encounter: Payer: Self-pay | Admitting: Internal Medicine

## 2017-08-03 ENCOUNTER — Encounter: Payer: Self-pay | Admitting: Internal Medicine

## 2017-08-03 ENCOUNTER — Ambulatory Visit: Payer: Medicare HMO | Admitting: Internal Medicine

## 2017-08-03 VITALS — BP 120/74 | HR 65 | Ht 60.0 in | Wt 140.0 lb

## 2017-08-03 DIAGNOSIS — I442 Atrioventricular block, complete: Secondary | ICD-10-CM | POA: Diagnosis not present

## 2017-08-03 DIAGNOSIS — Z950 Presence of cardiac pacemaker: Secondary | ICD-10-CM | POA: Diagnosis not present

## 2017-08-03 DIAGNOSIS — I447 Left bundle-branch block, unspecified: Secondary | ICD-10-CM | POA: Diagnosis not present

## 2017-08-03 DIAGNOSIS — I428 Other cardiomyopathies: Secondary | ICD-10-CM | POA: Diagnosis not present

## 2017-08-03 LAB — CUP PACEART INCLINIC DEVICE CHECK
Brady Statistic RA Percent Paced: 7.6 %
Brady Statistic RV Percent Paced: 96 %
Implantable Lead Implant Date: 20190219
Implantable Lead Implant Date: 20190219
Implantable Lead Location: 753859
Implantable Pulse Generator Implant Date: 20190219
Lead Channel Impedance Value: 512.5 Ohm
Lead Channel Pacing Threshold Amplitude: 0.5 V
Lead Channel Pacing Threshold Amplitude: 0.5 V
Lead Channel Pacing Threshold Amplitude: 1.5 V
Lead Channel Pacing Threshold Pulse Width: 0.5 ms
Lead Channel Pacing Threshold Pulse Width: 0.5 ms
Lead Channel Sensing Intrinsic Amplitude: 12 mV
Lead Channel Sensing Intrinsic Amplitude: 2.1 mV
Lead Channel Setting Pacing Amplitude: 2.5 V
Lead Channel Setting Pacing Pulse Width: 0.8 ms
MDC IDC LEAD IMPLANT DT: 20190219
MDC IDC LEAD LOCATION: 753858
MDC IDC LEAD LOCATION: 753860
MDC IDC MSMT BATTERY REMAINING LONGEVITY: 87 mo
MDC IDC MSMT BATTERY VOLTAGE: 2.99 V
MDC IDC MSMT LEADCHNL LV IMPEDANCE VALUE: 837.5 Ohm
MDC IDC MSMT LEADCHNL LV PACING THRESHOLD AMPLITUDE: 1.5 V
MDC IDC MSMT LEADCHNL LV PACING THRESHOLD PULSEWIDTH: 0.8 ms
MDC IDC MSMT LEADCHNL LV PACING THRESHOLD PULSEWIDTH: 0.8 ms
MDC IDC MSMT LEADCHNL RA PACING THRESHOLD AMPLITUDE: 0.5 V
MDC IDC MSMT LEADCHNL RA PACING THRESHOLD AMPLITUDE: 0.5 V
MDC IDC MSMT LEADCHNL RA PACING THRESHOLD PULSEWIDTH: 0.5 ms
MDC IDC MSMT LEADCHNL RA PACING THRESHOLD PULSEWIDTH: 0.5 ms
MDC IDC MSMT LEADCHNL RV IMPEDANCE VALUE: 587.5 Ohm
MDC IDC PG SERIAL: 8995296
MDC IDC SESS DTM: 20190520111619
MDC IDC SET LEADCHNL RA PACING AMPLITUDE: 2 V
MDC IDC SET LEADCHNL RV PACING AMPLITUDE: 2.5 V
MDC IDC SET LEADCHNL RV PACING PULSEWIDTH: 0.5 ms
MDC IDC SET LEADCHNL RV SENSING SENSITIVITY: 4 mV

## 2017-08-03 NOTE — Patient Instructions (Addendum)
Medication Instructions:  Your physician recommends that you continue on your current medications as directed. Please refer to the Current Medication list given to you today.  Labwork: None ordered.  Testing/Procedures: None ordered.  Follow-Up: Your physician wants you to follow-up in: 1 year with Dr. Lovena Le.   You will receive a reminder letter in the mail two months in advance. If you don't receive a letter, please call our office to schedule the follow-up appointment.  Remote monitoring is used to monitor your Pacemaker from home. This monitoring reduces the number of office visits required to check your device to one time per year. It allows [place] to keep an eye on the functioning of your device to ensure it is working properly. You are scheduled for a device check from home on 11/02/2017. You may send your transmission at any time that day. If you have a wireless device, the transmission will be sent automatically. After your physician reviews your transmission, you will receive a postcard with your next transmission date.  Any Other Special Instructions Will Be Listed Below (If Applicable).  If you need a refill on your cardiac medications before your next appointment, please call your pharmacy.

## 2017-08-03 NOTE — Progress Notes (Signed)
HPI Mrs. Pupo returns today for followup. She is a pleasant 84 yo woman with CHB, s/p PPM, LV dysfunction, who returns for biv PPM followup. She has returned to walking. She denies chest pain but does get sob walking up hills. She will slow down and feels better. She is walking about 20 minutes a day. She is drinking a glass of wine a day. Allergies  Allergen Reactions  . Penicillins Hives and Swelling  . Nickel Swelling and Rash  . Avapro [Irbesartan] Other (See Comments)    Unknown rxn  . Lisinopril Swelling    Swelling around eyes; also says it caused increased sugar and BP  . Simvastatin Other (See Comments)    unknown  . Latex Rash  . Sulfa Antibiotics Rash  . Sulfamethoxazole Rash     Current Outpatient Medications  Medication Sig Dispense Refill  . aspirin 81 MG tablet Take 1 tablet (81 mg total) by mouth daily. 30 tablet 1  . Calcium Carbonate-Vitamin D (CALCIUM 600 + D PO) Take 1 tablet by mouth daily.    . cholecalciferol (VITAMIN D) 1000 units tablet Take 1,000 Units by mouth daily.    . Cyanocobalamin (VITAMIN B-12 CR PO) Take by mouth.    . fish oil-omega-3 fatty acids 1000 MG capsule Take 2 g by mouth daily.    . hydroxypropyl methylcellulose / hypromellose (ISOPTO TEARS / GONIOVISC) 2.5 % ophthalmic solution Place 1 drop into both eyes 2 (two) times daily as needed for dry eyes.    . metoprolol tartrate (LOPRESSOR) 25 MG tablet Take 1 tablet (25 mg total) by mouth 2 (two) times daily. 60 tablet 6  . Multiple Vitamins-Minerals (AIRBORNE PO) Take 1 tablet by mouth daily as needed (for immune).    . Multiple Vitamins-Minerals (CENTRUM PO) Take by mouth.    . nitrofurantoin, macrocrystal-monohydrate, (MACROBID) 100 MG capsule Take 1 capsule (100 mg total) by mouth 2 (two) times daily. 180 capsule 3  . vitamin C (ASCORBIC ACID) 500 MG tablet Take 500 mg by mouth.    . vitamin E 1000 UNIT capsule Take 1,000 Units by mouth daily.     No current  facility-administered medications for this visit.      Past Medical History:  Diagnosis Date  . Diastolic dysfunction, left ventricle   . GERD (gastroesophageal reflux disease)   . 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  [name] History of blood transfusion   . History of rheumatic fever   . HTN (hypertension)    hx of refusing treatment--White coat HTN and/or situational HTN (?)   . Hyperlipidemia    hx of refusing treatment  . Insulin resistance    A1c's excellent per old records (6.1 in 2008 and 2009)  . Left bundle branch block 12/2011   Dr. Sallyanne Kuster at [facility]; ECHO and myocardial perfusion scan showed  septal and apical wall motion abnormality but she had no significant valvular disease and no ischemia.  She did have mildly decreased EF (39% on lexiscan and 50% on echo) and abnl LV relaxation.  Mild amount of PVCs.  Pt at higher risk for other conduction abnormalities, good chance of eventually requiring a pacemaker.   [name] LVH (left ventricular hypertrophy)   . Nonischemic cardiomyopathy ([place]) 2016  LV dysfunction due to LBBB/septal dyssynchrony  . Osteoarthritis    bilat knees--needs bilat TKA.  Ortho is trying steroid injections as of 10/23/15.  . Presence of permanent cardiac pacemaker 05/05/2017  . Seasonal allergies   . Solitary pulmonary nodule 05/2016   8 mm pleural based nodule in RLL.  Radiology recommended f/u noncontrast CT in 6-12 mo.    ROS:   All systems reviewed and negative except as noted in the HPI.   Past Surgical History:  Procedure Laterality Date  . ABDOMINAL HYSTERECTOMY  1963   At the time of her last C/S; says she had partial bladder resection at that time as well  . BI-VENTRICULAR PACEMAKER INSERTION (CRT-P)  05/05/2017  . BIV  PACEMAKER INSERTION CRT-P N/A 05/05/2017   Procedure: BIV PACEMAKER INSERTION CRT-P;  Surgeon: Evans Lance, MD;  Location: [place] [facility];  Service: Cardiovascular;  Laterality: N/A;  . CARDIOVASCULAR STRESS TEST  08/2005 & 12/2011   Low risk scans (on the 12/2011 scan she did have EF 39% with moderately severe LV dysfunction with septal dyssynergy probably contributed by LBBB  . Carotid dopplers  11/22/14; 11/30/16   NORMAL 2016 and 2018  . CATARACT EXTRACTION  08/14/11   both  . CESAREAN SECTION  X 5   One of her neonates died soon after birth  . CHOLECYSTECTOMY     1980's  . INSERT / REPLACE / REMOVE PACEMAKER    . JOINT REPLACEMENT Bilateral    knee  . right knee surgery Right 05/2016   ? R TKA: no records.  [name] STRABISMUS SURGERY  1939  . TONSILLECTOMY  1938  . TRANSTHORACIC ECHOCARDIOGRAM  01/13/12   Septal dyssynergy, EF 50%, LV relaxation impaired.  No significant valvular abnormalities.     Family History  Problem Relation Age of Onset  . Hypertension Mother   . Heart disease Mother   . Hyperlipidemia Mother   . Diabetes Mother   . Heart disease Father   . Cancer Father      Social History   Socioeconomic History  . Marital status: Widowed    Spouse name: Not on file  . Number of children: Not on file  . Years of education: Not on file  . Highest education level: Not on file  Occupational History  . Not on file  Social Needs  . Financial resource strain: Not on file  . Food insecurity:    Worry: Not on file    Inability: Not on file  . Transportation needs:    Medical: Not on file    Non-medical: Not on file  Tobacco Use  . Smoking status: Former Smoker    Last attempt to quit: 05/31/1958    Years since quitting: 59.2  . Smokeless tobacco: Never Used  Substance and Sexual Activity  . Alcohol use: Yes    Alcohol/week: 0.6 oz    Types: 1 Glasses of wine per week  . Drug use: No  . Sexual activity: Not on file  Lifestyle  . Physical activity:     Days per week: Not on file    Minutes per session: Not on file  . Stress: Not on file  Relationships  . Social connections:    Talks on phone: Not on file    Gets together: Not on file    Attends religious service: Not on file    Active member of club or organization: Not on file    Attends meetings of clubs or organizations:  Not on file    Relationship status: Not on file  . Intimate partner violence:    Fear of current or ex partner: Not on file    Emotionally abused: Not on file    Physically abused: Not on file    Forced sexual activity: Not on file  Other Topics Concern  . Not on file  Social History Narrative   Widower, husband died around 9 (wrongful death per pt's report).   Originally from [place], has been in [place] since about 1997.   Lives alone.  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   Fairly active and independent.   Distant hx of tobacco abuse.  No alcohol or drugs.     BP 120/74   Pulse 65   Ht 5' (1.524 m)   Wt 140 lb (63.5 kg)   SpO2 94%   BMI 27.34 kg/m   Physical Exam:  Well appearing 84 yo woman, NAD HEENT: Unremarkable Neck:  6 cm JVD, no thyromegally Lymphatics:  No adenopathy Back:  No CVA tenderness Lungs:  Clear with no wheezes HEART:  Regular rate rhythm, no murmurs, no rubs, no clicks Abd:  soft, positive bowel sounds, no organomegally, no rebound, no guarding Ext:  2 plus pulses, no edema, no cyanosis, no clubbing Skin:  No rashes no nodules Neuro:  CN II through XII intact, motor grossly intact  EKG - NSR with biv pacing  DEVICE  Normal device function.  See PaceArt for details.   Assess/Plan: 1. CHB - she has no escape today. She is asymptomatic after PPM insertion. 2. Chronic systolic heart failure - her symptoms are class 2. She will maintain a low sodium diet. At some point we will need to repeat her echo.  3. PPM - her [employer] Jude Biv PPM is working normally. I have reduced her outputs which  gives her about 7 years of battery longevity.  4. PVC's - her device interogation demonstrates about 3% PVC"s. She is asymptomatic. She will undergo watchful waiting.  Mikle Bosworth.D.

## 2017-08-13 ENCOUNTER — Encounter: Payer: Self-pay | Admitting: Physician Assistant

## 2017-08-27 DIAGNOSIS — Z96653 Presence of artificial knee joint, bilateral: Secondary | ICD-10-CM | POA: Diagnosis not present

## 2017-08-27 DIAGNOSIS — Z471 Aftercare following joint replacement surgery: Secondary | ICD-10-CM | POA: Diagnosis not present

## 2017-08-31 ENCOUNTER — Other Ambulatory Visit: Payer: Self-pay

## 2017-08-31 ENCOUNTER — Observation Stay (HOSPITAL_COMMUNITY)
Admission: EM | Admit: 2017-08-31 | Discharge: 2017-09-02 | Disposition: A | Discharge status: Home / Self Care | Payer: Medicare HMO | Attending: Internal Medicine | Admitting: Internal Medicine

## 2017-08-31 ENCOUNTER — Encounter (HOSPITAL_COMMUNITY): Payer: Self-pay

## 2017-08-31 ENCOUNTER — Emergency Department (HOSPITAL_COMMUNITY): Payer: Medicare HMO

## 2017-08-31 DIAGNOSIS — K219 Gastro-esophageal reflux disease without esophagitis: Secondary | ICD-10-CM | POA: Insufficient documentation

## 2017-08-31 DIAGNOSIS — Z950 Presence of cardiac pacemaker: Secondary | ICD-10-CM | POA: Insufficient documentation

## 2017-08-31 DIAGNOSIS — D500 Iron deficiency anemia secondary to blood loss (chronic): Secondary | ICD-10-CM | POA: Insufficient documentation

## 2017-08-31 DIAGNOSIS — Z882 Allergy status to sulfonamides status: Secondary | ICD-10-CM | POA: Diagnosis not present

## 2017-08-31 DIAGNOSIS — Z880 Allergy status to penicillin: Secondary | ICD-10-CM | POA: Insufficient documentation

## 2017-08-31 DIAGNOSIS — R42 Dizziness and giddiness: Secondary | ICD-10-CM | POA: Diagnosis not present

## 2017-08-31 DIAGNOSIS — K5730 Diverticulosis of large intestine without perforation or abscess without bleeding: Secondary | ICD-10-CM | POA: Diagnosis not present

## 2017-08-31 DIAGNOSIS — I428 Other cardiomyopathies: Secondary | ICD-10-CM

## 2017-08-31 DIAGNOSIS — R911 Solitary pulmonary nodule: Secondary | ICD-10-CM | POA: Insufficient documentation

## 2017-08-31 DIAGNOSIS — K5521 Angiodysplasia of colon with hemorrhage: Principal | ICD-10-CM | POA: Insufficient documentation

## 2017-08-31 DIAGNOSIS — K922 Gastrointestinal hemorrhage, unspecified: Secondary | ICD-10-CM | POA: Diagnosis not present

## 2017-08-31 DIAGNOSIS — K5731 Diverticulosis of large intestine without perforation or abscess with bleeding: Secondary | ICD-10-CM | POA: Insufficient documentation

## 2017-08-31 DIAGNOSIS — I10 Essential (primary) hypertension: Secondary | ICD-10-CM | POA: Diagnosis present

## 2017-08-31 DIAGNOSIS — E1165 Type 2 diabetes mellitus with hyperglycemia: Secondary | ICD-10-CM | POA: Diagnosis not present

## 2017-08-31 DIAGNOSIS — I429 Cardiomyopathy, unspecified: Secondary | ICD-10-CM | POA: Insufficient documentation

## 2017-08-31 DIAGNOSIS — Z79899 Other long term (current) drug therapy: Secondary | ICD-10-CM | POA: Diagnosis not present

## 2017-08-31 DIAGNOSIS — K625 Hemorrhage of anus and rectum: Secondary | ICD-10-CM | POA: Diagnosis not present

## 2017-08-31 DIAGNOSIS — Z87891 Personal history of nicotine dependence: Secondary | ICD-10-CM | POA: Insufficient documentation

## 2017-08-31 DIAGNOSIS — R10813 Right lower quadrant abdominal tenderness: Secondary | ICD-10-CM

## 2017-08-31 DIAGNOSIS — Z888 Allergy status to other drugs, medicaments and biological substances status: Secondary | ICD-10-CM | POA: Insufficient documentation

## 2017-08-31 DIAGNOSIS — K5790 Diverticulosis of intestine, part unspecified, without perforation or abscess without bleeding: Secondary | ICD-10-CM

## 2017-08-31 DIAGNOSIS — I442 Atrioventricular block, complete: Secondary | ICD-10-CM | POA: Diagnosis not present

## 2017-08-31 DIAGNOSIS — K921 Melena: Secondary | ICD-10-CM

## 2017-08-31 DIAGNOSIS — Z96653 Presence of artificial knee joint, bilateral: Secondary | ICD-10-CM | POA: Diagnosis not present

## 2017-08-31 DIAGNOSIS — Z8679 Personal history of other diseases of the circulatory system: Secondary | ICD-10-CM | POA: Diagnosis present

## 2017-08-31 DIAGNOSIS — R739 Hyperglycemia, unspecified: Secondary | ICD-10-CM

## 2017-08-31 LAB — COMPREHENSIVE METABOLIC PANEL
ALBUMIN: 3.5 g/dL (ref 3.5–5.0)
ALK PHOS: 41 U/L (ref 38–126)
ALT: 14 U/L (ref 14–54)
AST: 23 U/L (ref 15–41)
Anion gap: 9 (ref 5–15)
BILIRUBIN TOTAL: 0.5 mg/dL (ref 0.3–1.2)
BUN: 17 mg/dL (ref 6–20)
CALCIUM: 9 mg/dL (ref 8.9–10.3)
CO2: 28 mmol/L (ref 22–32)
CREATININE: 1.01 mg/dL — AB (ref 0.44–1.00)
Chloride: 104 mmol/L (ref 101–111)
GFR calc Af Amer: 57 mL/min — ABNORMAL LOW (ref >60)
GFR calc non Af Amer: 49 mL/min — ABNORMAL LOW (ref >60)
GLUCOSE: 186 mg/dL — AB (ref 65–99)
Potassium: 3.7 mmol/L (ref 3.5–5.1)
SODIUM: 141 mmol/L (ref 135–145)
TOTAL PROTEIN: 6.3 g/dL — AB (ref 6.5–8.1)

## 2017-08-31 LAB — TYPE AND SCREEN
ABO/RH(D): B POS
ANTIBODY SCREEN: NEGATIVE

## 2017-08-31 LAB — HEMOGLOBIN AND HEMATOCRIT, BLOOD
HEMATOCRIT: 34.6 % — AB (ref 36.0–46.0)
Hemoglobin: 11.1 g/dL — ABNORMAL LOW (ref 12.0–15.0)

## 2017-08-31 LAB — MAGNESIUM: MAGNESIUM: 2 mg/dL (ref 1.7–2.4)

## 2017-08-31 LAB — PROTIME-INR
INR: 1.08
Prothrombin Time: 13.9 seconds (ref 11.4–15.2)

## 2017-08-31 LAB — URINALYSIS, ROUTINE W REFLEX MICROSCOPIC
BILIRUBIN URINE: NEGATIVE
Bacteria, UA: NONE SEEN
GLUCOSE, UA: NEGATIVE mg/dL
Ketones, ur: NEGATIVE mg/dL
NITRITE: NEGATIVE
PROTEIN: NEGATIVE mg/dL
Specific Gravity, Urine: 1.014 (ref 1.005–1.030)
pH: 6 (ref 5.0–8.0)

## 2017-08-31 LAB — CBC
HCT: 40.9 % (ref 36.0–46.0)
Hemoglobin: 13 g/dL (ref 12.0–15.0)
MCH: 28.3 pg (ref 26.0–34.0)
MCHC: 31.8 g/dL (ref 30.0–36.0)
MCV: 89.1 fL (ref 78.0–100.0)
PLATELETS: 303 10*3/uL (ref 150–400)
RBC: 4.59 MIL/uL (ref 3.87–5.11)
RDW: 13.2 % (ref 11.5–15.5)
WBC: 7.7 10*3/uL (ref 4.0–10.5)

## 2017-08-31 LAB — I-STAT TROPONIN, ED: Troponin i, poc: 0.02 ng/mL (ref 0.00–0.08)

## 2017-08-31 LAB — APTT: aPTT: 27 seconds (ref 24–36)

## 2017-08-31 LAB — POC OCCULT BLOOD, ED: Fecal Occult Bld: POSITIVE — AB

## 2017-08-31 LAB — ABO/RH: ABO/RH(D): B POS

## 2017-08-31 MED ORDER — METRONIDAZOLE IN NACL 5-0.79 MG/ML-% IV SOLN
500.0000 mg | Freq: Three times a day (TID) | INTRAVENOUS | Status: DC
  Administered 2017-09-01 (×2): 500 mg via INTRAVENOUS
  Filled 2017-08-31 (×2): qty 100

## 2017-08-31 MED ORDER — CIPROFLOXACIN IN D5W 400 MG/200ML IV SOLN
400.0000 mg | Freq: Two times a day (BID) | INTRAVENOUS | Status: DC
  Administered 2017-08-31: 400 mg via INTRAVENOUS
  Filled 2017-08-31: qty 200

## 2017-08-31 MED ORDER — IOHEXOL 300 MG/ML  SOLN
100.0000 mL | Freq: Once | INTRAMUSCULAR | Status: AC | PRN
  Administered 2017-08-31: 100 mL via INTRAVENOUS

## 2017-08-31 MED ORDER — ONDANSETRON HCL 4 MG/2ML IJ SOLN: 4.0000 mg | Freq: Four times a day (QID) | INTRAMUSCULAR | Status: DC | PRN

## 2017-08-31 MED ORDER — ONDANSETRON HCL 4 MG PO TABS: 4.0000 mg | ORAL_TABLET | Freq: Four times a day (QID) | ORAL | Status: DC | PRN

## 2017-08-31 MED ORDER — SODIUM CHLORIDE 0.9 % IV BOLUS
1000.0000 mL | Freq: Once | INTRAVENOUS | Status: AC
  Administered 2017-08-31: 1000 mL via INTRAVENOUS

## 2017-08-31 NOTE — ED Notes (Signed)
Pt's son provided [facility] Medical Record phone # [phone] or [phone]

## 2017-08-31 NOTE — ED Notes (Signed)
Pt informed she needs to provide a urine sample when possible. Pt verbalized understanding.   

## 2017-08-31 NOTE — ED Notes (Signed)
Paged rep concerning pacemaker interrogation

## 2017-08-31 NOTE — H&P (Addendum)
History and Physical    Madison White:[id number] DOB: [birth year] DOA: 08/31/2017  PCP: Inda Coke, PA  Patient coming from: Home   Chief Complaint: Bleeding per rectum  HPI: Madison White is a 84 y.o. female with medical history significant for HTN, Complete heart block s/p ppm, Non ischemic cardiomyopathy.  Patient presented today with complaints of 4 episodes of bright red blood per rectum that started at about 10 AM today.  First episode had stool, but subsequent one was only blood she describes at least a moderate amount of blood each time.  After fourth episode she had a few minutes of dizziness on standing which she attributes mostly to anxiety.  She denies abdominal pain, shortness of breath or chest pain. Patient reports normal colored stools.  No prior history of GI bleed.  Never had a colonoscopy-states she was never told she needed one.  Patient takes daily baby aspirin. Not on anticoagulation. Denies NSAID use.   She reports that her orthopedist told her she because she has bilateral total knee replacements, she should have prophylactic antibiotics before any dosages including colonoscopies or dental work.  ED Course:blood pressure systolic [id number].Patient orthostatic in ED systolic drop 20 points. Heart rate- 83.  Hemoglobin- 13, platelets 303.  Unremarkable lytes creatinine.  EKG showed prolonged QTC 524, ventricular pacing.  UA trace leukocytes, moderate hemoglobin.  I-STAT troponin negative.  Pacer interrogated in ED. With abdominal tenderness palpated on exam-CT abdomen and pelvis done with contrast showed diverticulosis-descending and sigmoid colon with possible mild sigmoid inflammation. 1 L bolus given in ED. GI consulted- Dr. Nandigam-recommended admission, and tagged red blood cell scan if patient has another bleeding per rectum.   Review of Systems: As per HPI otherwise 10 point review of systems negative.   Past Medical History:  Diagnosis Date  . Diastolic  dysfunction, left ventricle   . GERD (gastroesophageal reflux disease)   . 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  [name] History of blood transfusion   . History of rheumatic fever   . HTN (hypertension)    hx of refusing treatment--White coat HTN and/or situational HTN (?)   . Hyperlipidemia    hx of refusing treatment  . Insulin resistance    A1c's excellent per old records (6.1 in 2008 and 2009)  . Left bundle branch block 12/2011   Dr. Sallyanne Kuster at [facility]; ECHO and myocardial perfusion scan showed  septal and apical wall motion abnormality but she had no significant valvular disease and no ischemia.  She did have mildly decreased EF (39% on lexiscan and 50% on echo) and abnl LV relaxation.  Mild amount of PVCs.  Pt at higher risk for other conduction abnormalities, good chance of eventually requiring a pacemaker.   [name] LVH (left ventricular hypertrophy)   . Nonischemic cardiomyopathy ([place]) 2016   LV dysfunction due to LBBB/septal dyssynchrony  . Osteoarthritis    bilat knees--needs bilat TKA.  Ortho is trying steroid injections as of 10/23/15.  . Presence of permanent cardiac pacemaker 05/05/2017  . Seasonal allergies   . Solitary pulmonary nodule 05/2016   8 mm pleural based nodule in RLL.  Radiology recommended f/u noncontrast CT in 6-12 mo.    Past Surgical History:  Procedure Laterality Date  . ABDOMINAL  HYSTERECTOMY  1963   At the time of her last C/S; says she had partial bladder resection at that time as well  . BI-VENTRICULAR PACEMAKER INSERTION (CRT-P)  05/05/2017  . BIV PACEMAKER INSERTION CRT-P N/A 05/05/2017   Procedure: BIV PACEMAKER INSERTION CRT-P;  Surgeon: Evans Lance, MD;  Location: [facility];  Service: Cardiovascular;  Laterality:  N/A;  . CARDIOVASCULAR STRESS TEST  08/2005 & 12/2011   Low risk scans (on the 12/2011 scan she did have EF 39% with moderately severe LV dysfunction with septal dyssynergy probably contributed by LBBB  . Carotid dopplers  11/22/14; 11/30/16   NORMAL 2016 and 2018  . CATARACT EXTRACTION  08/14/11   both  . CESAREAN SECTION  X 5   One of her neonates died soon after birth  . CHOLECYSTECTOMY     1980's  . INSERT / REPLACE / REMOVE PACEMAKER    . JOINT REPLACEMENT Bilateral    knee  . right knee surgery Right 05/2016   ? R TKA: no records.  [name] STRABISMUS SURGERY  1939  . TONSILLECTOMY  1938  . TRANSTHORACIC ECHOCARDIOGRAM  01/13/12   Septal dyssynergy, EF 50%, LV relaxation impaired.  No significant valvular abnormalities.     reports that she quit smoking about 59 years ago. She has never used smokeless tobacco. She reports that she drinks about 0.6 oz of alcohol per week. She reports that she does not use drugs.  Allergies  Allergen Reactions  . Penicillins Hives and Swelling    Has patient had a PCN reaction causing immediate rash, facial/tongue/throat swelling, SOB or lightheadedness with hypotension: Yes Has patient had a PCN reaction causing severe rash involving mucus membranes or skin necrosis: No Has patient had a PCN reaction that required hospitalization: No Has patient had a PCN reaction occurring within the last 10 years: No If all of the above answers are "NO", then may proceed with Cephalosporin use.   . Nickel Swelling and Rash  . Avapro [Irbesartan] Other (See Comments)    Unknown rxn  . Lisinopril Swelling    Swelling around eyes; also says it caused increased sugar and BP  . Simvastatin Other (See Comments)    unknown  . Latex Rash  . Sulfa Antibiotics Rash  . Sulfamethoxazole Rash    Family History  Problem Relation Age of Onset  . Hypertension Mother   . Heart disease Mother   . Hyperlipidemia Mother   . Diabetes Mother   . Heart disease Father   .  Cancer Father     Prior to Admission medications   Medication Sig Start Date End Date Taking? Authorizing Provider  metoprolol tartrate (LOPRESSOR) 25 MG tablet Take 1 tablet (25 mg total) by mouth 2 (two) times daily. Patient taking differently: Take 25 mg by mouth daily.  05/06/17  Yes Baldwin Jamaica, PA-C  aspirin 81 MG tablet Take 1 tablet (81 mg total) by mouth daily. Patient not taking: Reported on 08/31/2017 06/17/12   Tammi Sou, MD  nitrofurantoin, macrocrystal-monohydrate, (MACROBID) 100 MG capsule Take 1 capsule (100 mg total) by mouth 2 (two) times daily. Patient not taking: Reported on 08/31/2017 05/08/17   Evans Lance, MD    Physical Exam: Vitals:   08/31/17 1316 08/31/17 1426  BP: 131/67 (!) 119/54  Pulse: 83 65  Resp: 18 17  Temp: 98.1 F (36.7 C) 98.1 F (36.7 C)  TempSrc: Oral Oral  SpO2: 95% 96%    Constitutional: NAD, calm,  comfortable Vitals:   08/31/17 1316 08/31/17 1426  BP: 131/67 (!) 119/54  Pulse: 83 65  Resp: 18 17  Temp: 98.1 F (36.7 C) 98.1 F (36.7 C)  TempSrc: Oral Oral  SpO2: 95% 96%   Eyes: PERRL, lids and conjunctivae normal ENMT: Mucous membranes are moist. Posterior pharynx clear of any exudate or lesions.Normal dentition.  Neck: normal, supple, no masses, no thyromegaly Respiratory: clear to auscultation bilaterally, no wheezing, no crackles. Normal respiratory effort. No accessory muscle use.  Cardiovascular: Regular rate and rhythm, no murmurs / rubs / gallops. No extremity edema. 2+ pedal pulses. No carotid bruits.  Abdomen: no tenderness, no masses palpated. No hepatosplenomegaly. Bowel sounds positive.  Musculoskeletal: no clubbing / cyanosis. No joint deformity upper and lower extremities. Good ROM, no contractures. Normal muscle tone.  Skin: no rashes, lesions, ulcers. No induration Neurologic: CN 2-12 grossly intact. Sensation intact, DTR normal. Strength 5/5 in all 4.  Psychiatric: Normal judgment and insight. Alert  and oriented x 3. Normal mood.   Labs on Admission: I have personally reviewed following labs and imaging studies  CBC: Recent Labs  Lab 08/31/17 1325  WBC 7.7  HGB 13.0  HCT 40.9  MCV 89.1  PLT 500   Basic Metabolic Panel: Recent Labs  Lab 08/31/17 1325  NA 141  K 3.7  CL 104  CO2 28  GLUCOSE 186*  BUN 17  CREATININE 1.01*  CALCIUM 9.0   GFR: CrCl cannot be calculated (Unknown ideal weight.). Liver Function Tests: Recent Labs  Lab 08/31/17 1325  AST 23  ALT 14  ALKPHOS 41  BILITOT 0.5  PROT 6.3*  ALBUMIN 3.5   Coagulation Profile: Recent Labs  Lab 08/31/17 1627  INR 1.08   Urine analysis:    Component Value Date/Time   COLORURINE YELLOW 08/31/2017 1810   APPEARANCEUR CLEAR 08/31/2017 1810   LABSPEC 1.014 08/31/2017 1810   PHURINE 6.0 08/31/2017 1810   GLUCOSEU NEGATIVE 08/31/2017 1810   HGBUR MODERATE (A) 08/31/2017 1810   BILIRUBINUR NEGATIVE 08/31/2017 1810   BILIRUBINUR Negative 02/04/2017 [place] 08/31/2017 1810   PROTEINUR NEGATIVE 08/31/2017 1810   UROBILINOGEN 0.2 02/04/2017 1041   NITRITE NEGATIVE 08/31/2017 1810   LEUKOCYTESUR TRACE (A) 08/31/2017 1810    Radiological Exams on Admission: Ct Abdomen Pelvis W Contrast  Result Date: 08/31/2017 CLINICAL DATA:  84 year old female with painless bright red blood per rectum beginning this morning. EXAM: CT ABDOMEN AND PELVIS WITH CONTRAST TECHNIQUE: Multidetector CT imaging of the abdomen and pelvis was performed using the standard protocol following bolus administration of intravenous contrast. CONTRAST:  180mL OMNIPAQUE IOHEXOL 300 MG/ML  SOLN COMPARISON:  CT Abdomen and Pelvis 02/25/2017. FINDINGS: Lower chest: Stable cardiomegaly. Cardiac pacemaker or AICD leads. Calcified aortic atherosclerosis. No pericardial effusion. Stable 6-7 millimeter medial right lung base nodule on series 5, image 33. Chronic lung base scarring. No acute pulmonary opacity. No pleural effusion.  Hepatobiliary: Surgically absent gallbladder. Liver and biliary tree are within normal limits. Pancreas: Negative. Spleen: Negative. Adrenals/Urinary Tract: Normal adrenal glands. Lobulated 6 centimeter simple fluid density cystic lesion of the posterior right kidney appears benign. Otherwise symmetric and normal bilateral renal enhancement and contrast excretion. No hydronephrosis today. Normal proximal ureters. Mildly to moderately distended but otherwise unremarkable urinary bladder. Stomach/Bowel: Lobular hyperdense material mixed with stool in the rectum might be blood products (series 3, image 73). No definite rectal wall thickening or mesenteric inflammation. Diverticulosis throughout the sigmoid colon. Indistinct appearance of the sigmoid wall  on series 6, image 51, but otherwise no convincing active inflammation. Diverticulosis continues into the descending colon, no active inflammation. Negative splenic flexure and transverse colon. Negative hepatic flexure, right colon, and appendix. Negative terminal ileum. No dilated or abnormal small bowel loops. Decompressed stomach and duodenum. Chronic small duodenal diverticulum in the midline appears stable and inconsequential. No abdominal free air, free fluid. Vascular/Lymphatic: Aortoiliac calcified atherosclerosis. The major arterial structures in the abdomen and pelvis are patent. Portal venous system is patent. No lymphadenopathy. Reproductive: Surgically absent uterus. Diminutive or absent ovaries. Other: No pelvic free fluid. Musculoskeletal: Stable.  No acute osseous abnormality identified. IMPRESSION: 1. Diverticulosis of the descending and sigmoid colon with possible mild sigmoid wall inflammation. Questionable hyperdense blood products mixed with stool in the rectum. Consider a diverticular bleeding source. 2. No other acute or inflammatory process identified in the abdomen or pelvis. 3.  Aortic Atherosclerosis (ICD10-I70.0). Electronically Signed    By: Genevie Ann M.D.   On: 08/31/2017 18:08    EKG: Independently reviewed.  Prolonged QTC- 524, QRS 184, no P waves, appears ventricularly paced.  Assessment/Plan Principal Problem:   GI bleed Active Problems:   HTN (hypertension), benign   Nonischemic cardiomyopathy (HCC)   Complete heart block (HCC)   GI bleed- hgb at baseline, 13.  Orthostatic in ED otherwise stable vitals. CT shows diverticulosis but also possible mild sigmoid inflammation(see detailed report).  WBC- 7.7. No colonoscopy in the past.  No NSAIDs, no anticoagulation.  Denies melena.  -hgb check q. 8 hourly -N.p.o. midnight clears for now -Will start IV Cipro and metronidazole, for possible colitis -Follow-up GI recs a.m. -Per orthopedist notes 08/27/2017 in care everywhere-patient status post TKA 1 year ago.  Lactic antibiotics should be taken with any procedure including but not limited to dental work or colonoscopies.  Prolonged QTC- 524.  Chronic, on prior EKG. K3.7.  Not on QTC prolonging medication.  History of complete heart -Check magnesium  NICM hx, Complete heart block S/P PPM-dual-chamber PPM 05/05/2017 by Dr. Lovena Le.  Follows with Dr. Lovena Le.  ECho- 2013-EF 30%.  HTN- systolic 294- 765, orthostatic. 1L bolus given in ED - Hold home Bp meds in setting of GI bleed, metop 25 BID   DVT prophylaxis: Scds Code Status: Full Family Communication: PAtients son at bedide Disposition Plan: Per rounding team Consults called: GI Admission status: Obs, tele   Bethena Roys MD [facility] Pager [phone] From 6PM-2AM.  Otherwise please contact night-coverage [email or web address] Password [email or web address]  08/31/2017, 8:10 PM

## 2017-08-31 NOTE — ED Notes (Signed)
Med rep en route to interrogate pacemaker

## 2017-08-31 NOTE — ED Provider Notes (Addendum)
[facility] EMERGENCY DEPARTMENT Provider Note   CSN: [id number] Arrival date & time: 08/31/17  1306     History   Chief Complaint Chief Complaint  Patient presents with  . GI Bleeding    HPI Madison White is a 84 y.o. female with a PMHx of GERD, HTN, HLD, diastolic dysfunction, LVH, NICM, LBBB/complete heart block s/p pacemaker, chronic constipation, and other conditions listed below, who presents to the ED with complaints of bright red blood per rectum 4 times today starting around 10 AM with the last episode being around noon.  Patient states that she woke up ~10am and had a bowel movement, noticed there was a moderate amount of bright red blood in the toilet with a normal stool.  After that she felt the urge to have bowel movements again, total of 3 more episodes, and each time it was a moderate amount of bright red blood, no passage of clots.  She states that this was painless, and none of the other episodes had any stool mixed in with it aside from the first one.  After the fourth episode, which had the most blood out of any of the other episodes, she felt a little lightheaded when she stood up, which she thinks is because she felt very scared because of how much blood there was in the toilet.  She has not tried anything for symptoms, no known aggravating factors.  About 3-4 days ago she states that she had some straining with having a BM, but has not had any straining since then and has been having normal bowel movements.  She is not on any blood thinners.  She denies any history of GI bleed, had a transfusion after childbirth but otherwise has never had another transfusion.  She has never had a colonoscopy nor does she have a GI specialist.  Her PCP is Dr. Morene Rankins at [facility].    She denies fevers, chills, syncope, CP, SOB, abd pain, N/V/D/C, melena, rectal pain, hematuria, dysuria, vaginal bleeding/discharge, myalgias, arthralgias, numbness, tingling, focal weakness, or  any other complaints at this time.   The history is provided by the patient and medical records. No language interpreter was used.    Past Medical History:  Diagnosis Date  . Diastolic dysfunction, left ventricle   . GERD (gastroesophageal reflux disease)   . 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  [name] History of blood transfusion   . History of rheumatic fever   . HTN (hypertension)    hx of refusing treatment--White coat HTN and/or situational HTN (?)   . Hyperlipidemia    hx of refusing treatment  . Insulin resistance    A1c's excellent per old records (6.1 in 2008 and 2009)  . Left bundle branch block 12/2011   Dr. Sallyanne Kuster at [facility]; ECHO and myocardial perfusion scan showed  septal and apical wall motion abnormality but she had no significant valvular disease and no ischemia.  She did have mildly decreased EF (39% on lexiscan and 50% on echo) and abnl LV relaxation.  Mild amount of PVCs.  Pt at higher risk for other conduction abnormalities, good chance of eventually requiring a pacemaker.   [name] LVH (left ventricular hypertrophy)   . Nonischemic cardiomyopathy ([place]) 2016   LV dysfunction  due to LBBB/septal dyssynchrony  . Osteoarthritis    bilat knees--needs bilat TKA.  Ortho is trying steroid injections as of 10/23/15.  . Presence of permanent cardiac pacemaker 05/05/2017  . Seasonal allergies   . Solitary pulmonary nodule 05/2016   8 mm pleural based nodule in RLL.  Radiology recommended f/u noncontrast CT in 6-12 mo.    Patient Active Problem List   Diagnosis Date Noted  . Complete heart block ([place]) 05/05/2017  . Nonischemic cardiomyopathy ([place]) 11/17/2014  . Health maintenance examination 02/26/2014  . Upper airway cough syndrome 02/26/2014  .  Constipation, chronic 02/26/2014  . GERD (gastroesophageal reflux disease) 11/19/2012  . Dyslipidemia 09/02/2012  . Left bundle branch block 12/18/2011  . HTN (hypertension), benign 05/31/2011    Past Surgical History:  Procedure Laterality Date  . ABDOMINAL HYSTERECTOMY  1963   At the time of her last C/S; says she had partial bladder resection at that time as well  . BI-VENTRICULAR PACEMAKER INSERTION (CRT-P)  05/05/2017  . BIV PACEMAKER INSERTION CRT-P N/A 05/05/2017   Procedure: BIV PACEMAKER INSERTION CRT-P;  Surgeon: Evans Lance, MD;  Location: [facility];  Service: Cardiovascular;  Laterality: N/A;  . CARDIOVASCULAR STRESS TEST  08/2005 & 12/2011   Low risk scans (on the 12/2011 scan she did have EF 39% with moderately severe LV dysfunction with septal dyssynergy probably contributed by LBBB  . Carotid dopplers  11/22/14; 11/30/16   NORMAL 2016 and 2018  . CATARACT EXTRACTION  08/14/11   both  . CESAREAN SECTION  X 5   One of her neonates died soon after birth  . CHOLECYSTECTOMY     1980's  . INSERT / REPLACE / REMOVE PACEMAKER    . JOINT REPLACEMENT Bilateral    knee  . right knee surgery Right 05/2016   ? R TKA: no records.  [name] STRABISMUS SURGERY  1939  . TONSILLECTOMY  1938  . TRANSTHORACIC ECHOCARDIOGRAM  01/13/12   Septal dyssynergy, EF 50%, LV relaxation impaired.  No significant valvular abnormalities.     OB History   None      Home Medications    Prior to Admission medications   Medication Sig Start Date End Date Taking? Authorizing Provider  aspirin 81 MG tablet Take 1 tablet (81 mg total) by mouth daily. 06/17/12   McGowen, Adrian Blackwater, MD  Calcium Carbonate-Vitamin D (CALCIUM 600 + D PO) Take 1 tablet by mouth daily.    [provider]  cholecalciferol (VITAMIN D) 1000 units tablet Take 1,000 Units by mouth daily.    [provider]  Cyanocobalamin (VITAMIN B-12 CR PO) Take by mouth.    [provider]  fish oil-omega-3  fatty acids 1000 MG capsule Take 2 g by mouth daily.    [provider]  hydroxypropyl methylcellulose / hypromellose (ISOPTO TEARS / GONIOVISC) 2.5 % ophthalmic solution Place 1 drop into both eyes 2 (two) times daily as needed for dry eyes.    [provider]  metoprolol tartrate (LOPRESSOR) 25 MG tablet Take 1 tablet (25 mg total) by mouth 2 (two) times daily. 05/06/17   Baldwin Jamaica, PA-C  Multiple Vitamins-Minerals (AIRBORNE PO) Take 1 tablet by mouth daily as needed (for immune).    [provider]  Multiple Vitamins-Minerals (CENTRUM PO) Take by mouth.    [provider]  nitrofurantoin, macrocrystal-monohydrate, (MACROBID) 100 MG capsule Take 1 capsule (100 mg total) by mouth 2 (two) times daily. 05/08/17   Lovena Le,  Champ Mungo, MD  vitamin C (ASCORBIC ACID) 500 MG tablet Take 500 mg by mouth.    [provider]  vitamin E 1000 UNIT capsule Take 1,000 Units by mouth daily.    [provider]    Family History Family History  Problem Relation Age of Onset  . Hypertension Mother   . Heart disease Mother   . Hyperlipidemia Mother   . Diabetes Mother   . Heart disease Father   . Cancer Father     Social History Social History   Tobacco Use  . Smoking status: Former Smoker    Last attempt to quit: 05/31/1958    Years since quitting: 59.2  . Smokeless tobacco: Never Used  Substance Use Topics  . Alcohol use: Yes    Alcohol/week: 0.6 oz    Types: 1 Glasses of wine per week  . Drug use: No     Allergies   Penicillins; Nickel; Avapro [irbesartan]; Lisinopril; Simvastatin; Latex; Sulfa antibiotics; and Sulfamethoxazole   Review of Systems Review of Systems  Constitutional: Negative for chills and fever.  Respiratory: Negative for shortness of breath.   Cardiovascular: Negative for chest pain.  Gastrointestinal: Positive for anal bleeding. Negative for abdominal pain, blood in stool, constipation, diarrhea, nausea, rectal  pain and vomiting.  Genitourinary: Negative for dysuria, hematuria, vaginal bleeding and vaginal discharge.  Musculoskeletal: Negative for arthralgias and myalgias.  Skin: Negative for color change.  Allergic/Immunologic: Negative for immunocompromised state.  Neurological: Positive for light-headedness. Negative for syncope, weakness and numbness.  Hematological: Does not bruise/bleed easily.  Psychiatric/Behavioral: Negative for confusion.   All other systems reviewed and are negative for acute change except as noted in the HPI.    Physical Exam Updated Vital Signs BP (!) 119/54 (BP Location: Right Arm)   Pulse 65   Temp 98.1 F (36.7 C) (Oral)   Resp 17   SpO2 96%   Physical Exam  Constitutional: She is oriented to person, place, and time. Vital signs are normal. She appears well-developed and well-nourished.  Non-toxic appearance. No distress.  Afebrile, nontoxic, NAD  HENT:  Head: Normocephalic and atraumatic.  Mouth/Throat: Oropharynx is clear and moist and mucous membranes are normal.  Eyes: Conjunctivae and EOM are normal. Right eye exhibits no discharge. Left eye exhibits no discharge.  Neck: Normal range of motion. Neck supple.  Cardiovascular: Normal rate, regular rhythm, normal heart sounds and intact distal pulses. Exam reveals no gallop and no friction rub.  No murmur heard. Pulmonary/Chest: Effort normal and breath sounds normal. No respiratory distress. She has no decreased breath sounds. She has no wheezes. She has no rhonchi. She has no rales.  Abdominal: Soft. Normal appearance and bowel sounds are normal. She exhibits no distension. There is tenderness in the right lower quadrant. There is no rigidity, no rebound, no guarding, no CVA tenderness, no tenderness at McBurney's point and negative Murphy's sign.  Soft, nondistended, +BS throughout, with mild RLQ TTP, no r/g/r, neg murphy's, neg mcburney's, no CVA TTP   Genitourinary: Rectal exam shows internal  hemorrhoid and guaiac positive stool. Rectal exam shows no external hemorrhoid, no fissure, no mass, no tenderness and anal tone normal. Pelvic exam was performed with patient in the knee-chest position.  Genitourinary Comments: Chaperone present +Moderate amount of gross dark red blood noted on rectal exam, normal tone, no tenderness, no mass or fissure, no external hemorrhoids, no hemorrhoids prolapse with valsalva, possible internal hemorrhoid palpated but hard to tell, doesn't feel thrombosed. FOBT+  Musculoskeletal:  Normal range of motion.  Neurological: She is alert and oriented to person, place, and time. She has normal strength. No sensory deficit.  Skin: Skin is warm, dry and intact. No rash noted.  Psychiatric: She has a normal mood and affect.  Nursing note and vitals reviewed.   Orthostatic VS for the past 24 hrs:  BP- Lying Pulse- Lying BP- Sitting Pulse- Sitting BP- Standing at 0 minutes Pulse- Standing at 0 minutes  08/31/17 1558 140/74 60 131/65 64 120/64 69    ED Treatments / Results  Labs (all labs ordered are listed, but only abnormal results are displayed) Labs Reviewed  COMPREHENSIVE METABOLIC PANEL - Abnormal; Notable for the following components:      Result Value   Glucose, Bld 186 (*)    Creatinine, Ser 1.01 (*)    Total Protein 6.3 (*)    GFR calc non Af Amer 49 (*)    GFR calc Af Amer 57 (*)    All other components within normal limits  POC OCCULT BLOOD, ED - Abnormal; Notable for the following components:   Fecal Occult Bld POSITIVE (*)    All other components within normal limits  CBC  PROTIME-INR  APTT  URINALYSIS, ROUTINE W REFLEX MICROSCOPIC  I-STAT TROPONIN, ED  TYPE AND SCREEN  ABO/RH    EKG EKG Interpretation  Date/Time:  Monday August 31 2017 16:05:50 EDT Ventricular Rate:  60 PR Interval:    QRS Duration: 184 QT Interval:  524 QTC Calculation: 524 R Axis:   -88 Text Interpretation:  Electronic ventricular pacemaker Ventricular  premature complex RBBB and LAFB Probable left ventricular hypertrophy Baseline wander in lead(s) V6 Confirmed by Quintella Reichert [phone]) on 08/31/2017 4:17:01 PM   Radiology Ct Abdomen Pelvis W Contrast  Result Date: 08/31/2017 CLINICAL DATA:  84 year old female with painless bright red blood per rectum beginning this morning. EXAM: CT ABDOMEN AND PELVIS WITH CONTRAST TECHNIQUE: Multidetector CT imaging of the abdomen and pelvis was performed using the standard protocol following bolus administration of intravenous contrast. CONTRAST:  129mL OMNIPAQUE IOHEXOL 300 MG/ML  SOLN COMPARISON:  CT Abdomen and Pelvis 02/25/2017. FINDINGS: Lower chest: Stable cardiomegaly. Cardiac pacemaker or AICD leads. Calcified aortic atherosclerosis. No pericardial effusion. Stable 6-7 millimeter medial right lung base nodule on series 5, image 33. Chronic lung base scarring. No acute pulmonary opacity. No pleural effusion. Hepatobiliary: Surgically absent gallbladder. Liver and biliary tree are within normal limits. Pancreas: Negative. Spleen: Negative. Adrenals/Urinary Tract: Normal adrenal glands. Lobulated 6 centimeter simple fluid density cystic lesion of the posterior right kidney appears benign. Otherwise symmetric and normal bilateral renal enhancement and contrast excretion. No hydronephrosis today. Normal proximal ureters. Mildly to moderately distended but otherwise unremarkable urinary bladder. Stomach/Bowel: Lobular hyperdense material mixed with stool in the rectum might be blood products (series 3, image 73). No definite rectal wall thickening or mesenteric inflammation. Diverticulosis throughout the sigmoid colon. Indistinct appearance of the sigmoid wall on series 6, image 51, but otherwise no convincing active inflammation. Diverticulosis continues into the descending colon, no active inflammation. Negative splenic flexure and transverse colon. Negative hepatic flexure, right colon, and appendix. Negative terminal  ileum. No dilated or abnormal small bowel loops. Decompressed stomach and duodenum. Chronic small duodenal diverticulum in the midline appears stable and inconsequential. No abdominal free air, free fluid. Vascular/Lymphatic: Aortoiliac calcified atherosclerosis. The major arterial structures in the abdomen and pelvis are patent. Portal venous system is patent. No lymphadenopathy. Reproductive: Surgically absent uterus. Diminutive or absent ovaries. Other: No  pelvic free fluid. Musculoskeletal: Stable.  No acute osseous abnormality identified. IMPRESSION: 1. Diverticulosis of the descending and sigmoid colon with possible mild sigmoid wall inflammation. Questionable hyperdense blood products mixed with stool in the rectum. Consider a diverticular bleeding source. 2. No other acute or inflammatory process identified in the abdomen or pelvis. 3.  Aortic Atherosclerosis (ICD10-I70.0). Electronically Signed   By: Genevie Ann M.D.   On: 08/31/2017 18:08    Procedures Procedures (including critical care time)  Medications Ordered in ED Medications  sodium chloride 0.9 % bolus 1,000 mL (1,000 mLs Intravenous New Bag/Given 08/31/17 1635)  iohexol (OMNIPAQUE) 300 MG/ML solution 100 mL (100 mLs Intravenous Contrast Given 08/31/17 1743)     Initial Impression / Assessment and Plan / ED Course  I have reviewed the triage vital signs and the nursing notes.  Pertinent labs & imaging results that were available during my care of the patient were reviewed by me and considered in my medical decision making (see chart for details).     84 y.o. female here with BRBPR x4 episodes today, having some lightheadedness afterwards but thinks it's because she was scared from the amount of blood. On exam, mild RLQ TTP, nonperitoneal otherwise. Rectal exam with gross dark red blood in rectal vault, possible internal hemorrhoid palpated but hard to tell, no external hemorrhoid noted, no hemorrhoids prolapse on valsalva. VSS. Work  up thus far reveals: CMP with mild hyperglycemia 186 but no anion gap or change in bicarb, marginally elevated Cr 1.01 but otherwise unremarkable; CBC WNL. Will proceed with CT abd/pelv to evaluate for diverticulosis vs colitis vs other etiology of GI bleed; anticipate admission for serial H/H. Will add-on INR, APTT, U/A, trop, EKG, and obtain orthostatics given her lightheadedness; will also interrogate pacemaker. Will reassess shortly. Discussed case with my attending Dr. Ralene Bathe who agrees with plan.   6:16 PM Orthostatics borderline positive, 27mmHg drop in SBP when going from laying to standing; fluids running. EKG without acute findings. Trop neg. FOBT+. INR WNL. APTT WNL. CT abd/pelv showing diverticulosis in descending and sigmoid colon with possible mild sigmoid wall inflammation, questionable hyperdense blood products mixed with stool in rectum, consider diverticular bleed. U/A pending, pacemaker interrogation being performed now. Will consult GI, and then likely admit for acute GI bleed.   6:25 PM Dr. Silverio Decamp of GI returning page, who will consult while she's admitted; if she has another bloody BM, then she should nuc med RBC tag scan to isolate where bleeding is coming from, but if she doesn't have another bloody BM episode then that won't be necessary; doesn't think she needs protonix or other meds now, this is likely a diverticular bleed. Will proceed with admission.   6:50 PM Pacemaker interrogation unremarkable, no acute events reported. U/A pending. Dr. Denton Brick of [facility] returning page and will admit. Holding orders to be placed by admitting team. Please see their notes for further documentation of care. I appreciate their help with this pleasant pt's care. Pt stable at time of admission.    Final Clinical Impressions(s) / ED Diagnoses   Final diagnoses:  Acute lower GI bleeding  Rectal bleeding  Right lower quadrant abdominal tenderness without rebound tenderness  Orthostatic  lightheadedness  Hyperglycemia, unspecified  Diverticulosis    ED Discharge Orders    [address]ubin, [place], [place] 08/31/17 1850  ADDENDUM 7:31 PM: Pt's son informed me that her orthopedist recently told her that if she needed any procedures (like colonoscopies, dental  work, Social research officer, government) then she'd need prophylactic abx; I've sent a text-page to the admitting hospitalist regarding this information. Please see their notes regarding further care.     [address], [place], [place] 08/31/17 1932    Quintella Reichert, MD 09/02/17 1423

## 2017-08-31 NOTE — ED Provider Notes (Signed)
Patient placed in Quick Look pathway, seen and evaluated   Chief Complaint: Rectal bleeding  HPI:   Pt reports she has had 3 bowel movements.  Pt reports all have been bright red blood  ROS: no abdominal pain, no fever, no chills  Physical Exam:   Gen: No distress  Neuro: Awake and Alert  Skin: Warm    Focused Exam: Lungs clear, Heart  Rrr, Abdomen soft nontendere    Initiation of care has begun. The patient has been counseled on the process, plan, and necessity for staying for the completion/evaluation, and the remainder of the medical screening examination   Sidney Ace 08/31/17 [place], Ankit, MD 09/03/17 1101

## 2017-08-31 NOTE — ED Triage Notes (Signed)
Pt states she has had 4 episodes of bloody diarrhea today. Denies hx of same. Pt alert and oriented. Normotensive in triage.

## 2017-09-01 ENCOUNTER — Other Ambulatory Visit: Payer: Self-pay

## 2017-09-01 DIAGNOSIS — D62 Acute posthemorrhagic anemia: Secondary | ICD-10-CM

## 2017-09-01 DIAGNOSIS — K5520 Angiodysplasia of colon without hemorrhage: Secondary | ICD-10-CM | POA: Diagnosis not present

## 2017-09-01 DIAGNOSIS — K922 Gastrointestinal hemorrhage, unspecified: Secondary | ICD-10-CM | POA: Diagnosis not present

## 2017-09-01 DIAGNOSIS — K5730 Diverticulosis of large intestine without perforation or abscess without bleeding: Secondary | ICD-10-CM | POA: Diagnosis not present

## 2017-09-01 LAB — BASIC METABOLIC PANEL
ANION GAP: 6 (ref 5–15)
BUN: 10 mg/dL (ref 6–20)
CO2: 28 mmol/L (ref 22–32)
Calcium: 8.8 mg/dL — ABNORMAL LOW (ref 8.9–10.3)
Chloride: 109 mmol/L (ref 101–111)
Creatinine, Ser: 0.76 mg/dL (ref 0.44–1.00)
GFR calc Af Amer: >60 mL/min (ref >60)
Glucose, Bld: 101 mg/dL — ABNORMAL HIGH (ref 65–99)
POTASSIUM: 4.3 mmol/L (ref 3.5–5.1)
SODIUM: 143 mmol/L (ref 135–145)

## 2017-09-01 LAB — CBC
HCT: 33.8 % — ABNORMAL LOW (ref 36.0–46.0)
HEMOGLOBIN: 11 g/dL — AB (ref 12.0–15.0)
MCH: 28.8 pg (ref 26.0–34.0)
MCHC: 32.5 g/dL (ref 30.0–36.0)
MCV: 88.5 fL (ref 78.0–100.0)
Platelets: 241 10*3/uL (ref 150–400)
RBC: 3.82 MIL/uL — ABNORMAL LOW (ref 3.87–5.11)
RDW: 13.2 % (ref 11.5–15.5)
WBC: 7.6 10*3/uL (ref 4.0–10.5)

## 2017-09-01 LAB — MAGNESIUM: MAGNESIUM: 2 mg/dL (ref 1.7–2.4)

## 2017-09-01 MED ORDER — METOCLOPRAMIDE HCL 10 MG PO TABS
10.0000 mg | ORAL_TABLET | Freq: Once | ORAL | Status: AC
  Administered 2017-09-02: 10 mg via ORAL
  Filled 2017-09-01: qty 1

## 2017-09-01 MED ORDER — PEG-KCL-NACL-NASULF-NA ASC-C 100 G PO SOLR: 1.0000 | Freq: Once | ORAL | Status: DC

## 2017-09-01 MED ORDER — BISACODYL 5 MG PO TBEC
5.0000 mg | DELAYED_RELEASE_TABLET | Freq: Once | ORAL | Status: AC
  Administered 2017-09-01: 5 mg via ORAL
  Filled 2017-09-01: qty 1

## 2017-09-01 MED ORDER — PANTOPRAZOLE SODIUM 40 MG PO TBEC
40.0000 mg | DELAYED_RELEASE_TABLET | Freq: Every day | ORAL | Status: DC
  Administered 2017-09-01 – 2017-09-02 (×2): 40 mg via ORAL
  Filled 2017-09-01 (×3): qty 1

## 2017-09-01 MED ORDER — PEG-KCL-NACL-NASULF-NA ASC-C 100 G PO SOLR
0.5000 | Freq: Once | ORAL | Status: AC
  Administered 2017-09-02: 100 g via ORAL

## 2017-09-01 MED ORDER — SODIUM CHLORIDE 0.9 % IV SOLN
INTRAVENOUS | Status: DC
  Administered 2017-09-01 – 2017-09-02 (×2): via INTRAVENOUS

## 2017-09-01 MED ORDER — PEG-KCL-NACL-NASULF-NA ASC-C 100 G PO SOLR
0.5000 | Freq: Once | ORAL | Status: AC
  Administered 2017-09-01: 100 g via ORAL
  Filled 2017-09-01 (×2): qty 1

## 2017-09-01 MED ORDER — METOCLOPRAMIDE HCL 10 MG PO TABS
10.0000 mg | ORAL_TABLET | Freq: Once | ORAL | Status: AC
  Administered 2017-09-01: 10 mg via ORAL
  Filled 2017-09-01: qty 1

## 2017-09-01 NOTE — Progress Notes (Addendum)
PROGRESS NOTE    Madison White  YPP:[id number] DOB: [birth year] DOA: 08/31/2017 PCP: Inda Coke, PA   Brief Narrative: Patient is a 84 year old female with past medical history of hypertension, complete heart block status post permanent pacemaker placement, nonischemic cardiomyopathy who presented to the emergency department with complaints of 4 episodes of bright red blood per rectum.  Patient was on aspirin at home. Patient was admitted for the management of GI bleed. GI has been consulted.  Plan is for colonoscopy tomorrow.  Assessment & Plan:   Principal Problem:   GI bleed Active Problems:   HTN (hypertension), benign   Nonischemic cardiomyopathy (HCC)   Complete heart block (HCC)  Lower GI bleed: Hemoglobin around baseline.  Patient was found to be orthostatic in the emergency department. Continue gentle IV fluids.  GI has already been consulted.  Start on clear liquid diet.  Possible plan for colonoscopy tomorrow.  Questionable sigmoid diverticulitis: CT done in the emergency department showed diverticulosis but also possible mild sigmoid inflammation.  She was initially started on antibiotics.  Patient does not complain of any abdominal pain.  Says she does not have any leukocytosis or fever.  Will discontinue antibiotics.  Prolonged QTC: She is not on any QT prolonging medication.  Potassium and magnesium level normal.  QTC prolonged at 524.  Will check EKG tomorrow.  History of nonischemic cardiomyopathy/complete heart block: Status post pacemaker placement on 05/05/2017 by Dr. Lovena Le.  Echo in 2013 showed ejection fraction 50%.  History of hypertension: Currently blood pressure stable.  Noted to be orthostatic in the emergency department.  WPY:KDXI blood pressure medications have been held.  Continue gentle IV fluids.   DVT prophylaxis:SCD Code Status: Full Family Communication: None present at the bedside Disposition Plan: Home after colonoscopy   Consultants:  Gastroenterology   Procedures: None  Antimicrobials: None  Subjective: Patient seen and examined the bedside this morning.  Remains comfortable.  Had a bloody bowel movement earlier this morning.  Denies any abdominal pain.  No active bleeding at present.  Objective: Vitals:   08/31/17 2115 08/31/17 2159 09/01/17 0524 09/01/17 0600  BP: (!) 168/94 (!) 130/99 (!) 143/51 (!) 152/52  Pulse: 72 72 72 72  Resp: 13 16 16 16   Temp:  98.4 F (36.9 C) 97.7 F (36.5 C) 98.2 F (36.8 C)  TempSrc:  Oral Oral Oral  SpO2: 96%  95% 92%  Weight:  63.3 kg (139 lb 8.8 oz)    Height:  5' (1.524 m)      Intake/Output Summary (Last 24 hours) at 09/01/2017 1335 Last data filed at 09/01/2017 0500 Gross per 24 hour  Intake 999.75 ml  Output -  Net 999.75 ml   Filed Weights   08/31/17 2159  Weight: 63.3 kg (139 lb 8.8 oz)    Examination:  General exam: Appears calm and comfortable ,Not in distress,average built HEENT:PERRL,Oral mucosa moist, Ear/Nose normal on gross exam Respiratory system: Bilateral equal air entry, normal vesicular breath sounds, no wheezes or crackles  Cardiovascular system: S1 & S2 heard, RRR. No JVD, murmurs, rubs, gallops or clicks. No pedal edema.Pacemaker Gastrointestinal system: Abdomen is nondistended, soft and nontender. No organomegaly or masses felt. Normal bowel sounds heard. Central nervous system: Alert and oriented. No focal neurological deficits. Extremities: No edema, no clubbing ,no cyanosis, distal peripheral pulses palpable. Skin: No rashes, lesions or ulcers,no icterus ,no pallor MSK: Normal muscle bulk,tone ,power Psychiatry: Judgement and insight appear normal. Mood & affect appropriate.     Data  Reviewed: I have personally reviewed following labs and imaging studies  CBC: Recent Labs  Lab 08/31/17 1325 08/31/17 2010 09/01/17 0539  WBC 7.7  --  7.6  HGB 13.0 11.1* 11.0*  HCT 40.9 34.6* 33.8*  MCV 89.1  --  88.5  PLT 303  --  867   Basic  Metabolic Panel: Recent Labs  Lab 08/31/17 1325 08/31/17 2010 09/01/17 0900  NA 141  --  143  K 3.7  --  4.3  CL 104  --  109  CO2 28  --  28  GLUCOSE 186*  --  101*  BUN 17  --  10  CREATININE 1.01*  --  0.76  CALCIUM 9.0  --  8.8*  MG  --  2.0 2.0   GFR: Estimated Creatinine Clearance: 41.9 mL/min (by C-G formula based on SCr of 0.76 mg/dL). Liver Function Tests: Recent Labs  Lab 08/31/17 1325  AST 23  ALT 14  ALKPHOS 41  BILITOT 0.5  PROT 6.3*  ALBUMIN 3.5   No results for input(s): LIPASE, AMYLASE in the last 168 hours. No results for input(s): AMMONIA in the last 168 hours. Coagulation Profile: Recent Labs  Lab 08/31/17 1627  INR 1.08   Cardiac Enzymes: No results for input(s): CKTOTAL, CKMB, CKMBINDEX, TROPONINI in the last 168 hours. BNP (last 3 results) No results for input(s): PROBNP in the last 8760 hours. HbA1C: No results for input(s): HGBA1C in the last 72 hours. CBG: No results for input(s): GLUCAP in the last 168 hours. Lipid Profile: No results for input(s): CHOL, HDL, LDLCALC, TRIG, CHOLHDL, LDLDIRECT in the last 72 hours. Thyroid Function Tests: No results for input(s): TSH, T4TOTAL, FREET4, T3FREE, THYROIDAB in the last 72 hours. Anemia Panel: No results for input(s): VITAMINB12, FOLATE, FERRITIN, TIBC, IRON, RETICCTPCT in the last 72 hours. Sepsis Labs: No results for input(s): PROCALCITON, LATICACIDVEN in the last 168 hours.  No results found for this or any previous visit (from the past 240 hour(s)).       Radiology Studies: Ct Abdomen Pelvis W Contrast  Result Date: 08/31/2017 CLINICAL DATA:  84 year old female with painless bright red blood per rectum beginning this morning. EXAM: CT ABDOMEN AND PELVIS WITH CONTRAST TECHNIQUE: Multidetector CT imaging of the abdomen and pelvis was performed using the standard protocol following bolus administration of intravenous contrast. CONTRAST:  172mL OMNIPAQUE IOHEXOL 300 MG/ML  SOLN  COMPARISON:  CT Abdomen and Pelvis 02/25/2017. FINDINGS: Lower chest: Stable cardiomegaly. Cardiac pacemaker or AICD leads. Calcified aortic atherosclerosis. No pericardial effusion. Stable 6-7 millimeter medial right lung base nodule on series 5, image 33. Chronic lung base scarring. No acute pulmonary opacity. No pleural effusion. Hepatobiliary: Surgically absent gallbladder. Liver and biliary tree are within normal limits. Pancreas: Negative. Spleen: Negative. Adrenals/Urinary Tract: Normal adrenal glands. Lobulated 6 centimeter simple fluid density cystic lesion of the posterior right kidney appears benign. Otherwise symmetric and normal bilateral renal enhancement and contrast excretion. No hydronephrosis today. Normal proximal ureters. Mildly to moderately distended but otherwise unremarkable urinary bladder. Stomach/Bowel: Lobular hyperdense material mixed with stool in the rectum might be blood products (series 3, image 73). No definite rectal wall thickening or mesenteric inflammation. Diverticulosis throughout the sigmoid colon. Indistinct appearance of the sigmoid wall on series 6, image 51, but otherwise no convincing active inflammation. Diverticulosis continues into the descending colon, no active inflammation. Negative splenic flexure and transverse colon. Negative hepatic flexure, right colon, and appendix. Negative terminal ileum. No dilated or abnormal small bowel loops.  Decompressed stomach and duodenum. Chronic small duodenal diverticulum in the midline appears stable and inconsequential. No abdominal free air, free fluid. Vascular/Lymphatic: Aortoiliac calcified atherosclerosis. The major arterial structures in the abdomen and pelvis are patent. Portal venous system is patent. No lymphadenopathy. Reproductive: Surgically absent uterus. Diminutive or absent ovaries. Other: No pelvic free fluid. Musculoskeletal: Stable.  No acute osseous abnormality identified. IMPRESSION: 1. Diverticulosis of the  descending and sigmoid colon with possible mild sigmoid wall inflammation. Questionable hyperdense blood products mixed with stool in the rectum. Consider a diverticular bleeding source. 2. No other acute or inflammatory process identified in the abdomen or pelvis. 3.  Aortic Atherosclerosis (ICD10-I70.0). Electronically Signed   By: Genevie Ann M.D.   On: 08/31/2017 18:08        Scheduled Meds: Continuous Infusions:   LOS: 0 days    Time spent: More than 50% of that time was spent in counseling and/or coordination of care.      Shelly Coss, MD [facility] Pager [phone]  If 7PM-7AM, please contact night-coverage [email or web address] Password [email or web address] 09/01/2017, 1:35 PM

## 2017-09-01 NOTE — Progress Notes (Signed)
Pt started on Moviprep. Pt and family asking about antibiotics prophylactically for colonoscopy - MD paged. Pt educated about bowel prep - pt and family receptive to education.

## 2017-09-01 NOTE — H&P (View-Only) (Signed)
                                                                           [facility] Consult: 8:07 AM 09/01/2017  LOS: 0 days    Referring Provider: Dr Adhikari  Primary Care Physician:  Worley, Samantha, PA of [place] Primary Gastroenterologist:  Unassigned.  None.     Reason for Consultation:  Painless hematochezia.     HPI: Madison White is a 84 y.o. female.  PMH/PSH outlined below. S/p cardiac pacemaker 04/2017 for CHB.  Class 2 CHF.  Followed up with Dr Taylor on 08/03/17 and felt to be doing well. On 81 mg ASA No previous colonoscopy or EGD but pt says she did a stool test involving a box, submitted a stool specimen.  An RN note from 01/07/2016 states they were ordering a Cologuard test.  No result from this found.     ~ 4 episodes of painless hematochezia at home from 10 AM to noon.  Initially blood mixed with brown stool.  The others were pure blood.  + dizziness with standing.  Strained to have BM ~ 4 days prior but no straining with yesterdays bleeding.  After arriving in the ED in the afternoon she had no further episodes until early this morning.  She feels well.  She still has no abdominal pain.  No nausea or vomiting.  Patient has never seen blood even in scant amounts before.  She has occasional constipation which she manages by eating fruit, prunes are especially effective.  Last week she was a little more constipated than normal but this week bowel movements have been daily per usual.  Several days ago she noticed small, bilateral skin eruptions, abscesses/furuncles in the region of her upper thigh/groin.  She treated these with Neosporin.  These are healing.  Patient has not had fever, chills, sweats.   Hgb 13 >> 11. Was 14.0 in 02/2017, 13 in 04/2017.  MCV 88.    Coags, platelets normal.   CMET with Glucose 186.  BUN/Creat 17/1.0.  CT with contrast: left sided  diverticulosis, ? Blood mixed with stool in rectum.  ? Mild sigmoid thickening.    Patient states she was advised by her orthopedic surgeon to have prophylactic antibiotics before dental work and colonoscopies.  However she also believes she is still taking antibiotic Macrodantin RXd after PPM placed but she is not according to family's records.  Was unaware/confused as to date of when the pacemaker was placed.  Her dtr sets up pt's pill box.     Past Medical History:  Diagnosis Date  . Diastolic dysfunction, left ventricle   . GERD (gastroesophageal reflux disease)   . 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  . History of blood transfusion   . History of rheumatic fever   . HTN (hypertension)    hx of refusing treatment--White coat HTN and/or situational HTN (?)   . Hyperlipidemia    hx of refusing treatment  . Insulin   resistance    A1c's excellent per old records (6.1 in 2008 and 2009)  . Left bundle branch block 12/2011   Dr. Croitoru at [facility]; ECHO and myocardial perfusion scan showed  septal and apical wall motion abnormality but she had no significant valvular disease and no ischemia.  She did have mildly decreased EF (39% on lexiscan and 50% on echo) and abnl LV relaxation.  Mild amount of PVCs.  Pt at higher risk for other conduction abnormalities, good chance of eventually requiring a pacemaker.   . LVH (left ventricular hypertrophy)   . Nonischemic cardiomyopathy (HCC) 2016   LV dysfunction due to LBBB/septal dyssynchrony  . Osteoarthritis    bilat knees--needs bilat TKA.  Ortho is trying steroid injections as of 10/23/15.  . Presence of permanent cardiac pacemaker 05/05/2017  . Seasonal allergies   . Solitary pulmonary nodule 05/2016   8 mm pleural  based nodule in RLL.  Radiology recommended f/u noncontrast CT in 6-12 mo.    Past Surgical History:  Procedure Laterality Date  . ABDOMINAL HYSTERECTOMY  1963   At the time of her last C/S; says she had partial bladder resection at that time as well  . BI-VENTRICULAR PACEMAKER INSERTION (CRT-P)  05/05/2017  . BIV PACEMAKER INSERTION CRT-P N/A 05/05/2017   Procedure: BIV PACEMAKER INSERTION CRT-P;  Surgeon: Taylor, Gregg W, MD;  Location: [facility];  Service: Cardiovascular;  Laterality: N/A;  . CARDIOVASCULAR STRESS TEST  08/2005 & 12/2011   Low risk scans (on the 12/2011 scan she did have EF 39% with moderately severe LV dysfunction with septal dyssynergy probably contributed by LBBB  . Carotid dopplers  11/22/14; 11/30/16   NORMAL 2016 and 2018  . CATARACT EXTRACTION  08/14/11   both  . CESAREAN SECTION  X 5   One of her neonates died soon after birth  . CHOLECYSTECTOMY     1980's  . INSERT / REPLACE / REMOVE PACEMAKER    . JOINT REPLACEMENT Bilateral    knee  . right knee surgery Right 05/2016   ? R TKA: no records.  . STRABISMUS SURGERY  1939  . TONSILLECTOMY  1938  . TRANSTHORACIC ECHOCARDIOGRAM  01/13/12   Septal dyssynergy, EF 50%, LV relaxation impaired.  No significant valvular abnormalities.    Prior to Admission medications   Medication Sig Start Date End Date Taking? Authorizing Provider  metoprolol tartrate (LOPRESSOR) 25 MG tablet Take 1 tablet (25 mg total) by mouth 2 (two) times daily. Patient taking differently: Take 25 mg by mouth daily.  05/06/17  Yes Ursuy, Renee Lynn, PA-C  aspirin 81 MG tablet Take 1 tablet (81 mg total) by mouth daily. Patient not taking: Reported on 08/31/2017 06/17/12   McGowen, Philip H, MD  nitrofurantoin, macrocrystal-monohydrate, (MACROBID) 100 MG capsule Take 1 capsule (100 mg total) by mouth 2 (two) times daily. Patient not taking: Reported on 08/31/2017 05/08/17   Taylor, Gregg W, MD    Scheduled Meds:  Infusions: .  ciprofloxacin Stopped (09/01/17 0011)  . metronidazole 500 mg (09/01/17 0627)   PRN Meds: ondansetron **OR** ondansetron (ZOFRAN) IV   Allergies as of 08/31/2017 - Review Complete 08/31/2017  Allergen Reaction Noted  . Penicillins Hives and Swelling 05/28/2011  . Nickel Swelling and Rash 04/29/2016  . Avapro [irbesartan] Other (See Comments) 06/06/2011  . Lisinopril Swelling 05/31/2011  . Simvastatin Other (See Comments) 01/21/2012  . Latex Rash 04/29/2016  . Sulfa antibiotics Rash 05/28/2011  . Sulfamethoxazole Rash 04/29/2016      Family History  Problem Relation Age of Onset  . Hypertension Mother   . Heart disease Mother   . Hyperlipidemia Mother   . Diabetes Mother   . Heart disease Father   . Cancer Father     Social History   Socioeconomic History  . Marital status: Widowed    Spouse name: Not on file  . Number of children: Not on file  . Years of education: Not on file  . Highest education level: Not on file  Occupational History  . Not on file  Social Needs  . Financial resource strain: Not on file  . Food insecurity:    Worry: Not on file    Inability: Not on file  . Transportation needs:    Medical: Not on file    Non-medical: Not on file  Tobacco Use  . Smoking status: Former Smoker    Last attempt to quit: 05/31/1958    Years since quitting: 59.2  . Smokeless tobacco: Never Used  Substance and Sexual Activity  . Alcohol use: Yes    Alcohol/week: 0.6 oz    Types: 1 Glasses of wine per week  . Drug use: No  . Sexual activity: Not on file  Lifestyle  . Physical activity:    Days per week: Not on file    Minutes per session: Not on file  . Stress: Not on file  Relationships  . Social connections:    Talks on phone: Not on file    Gets together: Not on file    Attends religious service: Not on file    Active member of club or organization: Not on file    Attends meetings of clubs or organizations: Not on file    Relationship status: Not on  file  . Intimate partner violence:    Fear of current or ex partner: Not on file    Emotionally abused: Not on file    Physically abused: Not on file    Forced sexual activity: Not on file  Other Topics Concern  . Not on file  Social History Narrative   Widower, husband died around 1993 (wrongful death per pt's report).   Originally from [place], has been in [place] since about 1997.   Lives alone.  Former accountant and lay pastor-retired.  Enjoys painting and writing.   Fairly active and independent.   Distant hx of tobacco abuse.  No alcohol or drugs.    REVIEW OF SYSTEMS: Constitutional: Generally has pretty good strength.  She is able to walk and climb stairs. ENT:  No nose bleeds Pulm: No shortness of breath.  No cough. CV:  No chest pain, no LE edema.  Patient checked out her heart rate monitor yesterday after she bled and she noticed that it looked a bit irregular. GU:  No hematuria, no frequency GI:  Per HPI.  No GER sxs, no dysphagia, no anorexia. Heme:  No unusual bruising. Transfusions: Decades ago she had blood transfusion after childbirth. Neuro:  No headaches, no peripheral tingling or numbness.  No seizures.  Loss of limb function. Derm: Skin eruptions on the upper thigh are mostly healed. Endocrine:  No sweats or chills.  No polyuria or dysuria Immunization: Reviewed. Travel:  None beyond local counties in last few months.    PHYSICAL EXAM: Vital signs in last 24 hours: Vitals:   09/01/17 0524 09/01/17 0600  BP: (!) 143/51 (!) 152/52  Pulse: 72 72  Resp: 16 16  Temp: 97.7 F (36.5 C)   98.2 F (36.8 C)  SpO2: 95% 92%   Wt Readings from Last 3 Encounters:  08/31/17 139 lb 8.8 oz (63.3 kg)  08/03/17 140 lb (63.5 kg)  06/17/17 139 lb (63 kg)    General: Patient looks well.  She appears younger than stated age.  Comfortable.  Not ill looking Head: No facial asymmetry or swelling.  No signs of head trauma. Eyes: No scleral icterus.  No conjunctival pallor.   EOMI. Ears: Slightly hard of hearing. Nose: No discharge or congestion. Mouth: Tongue midline.  Oral mucosa pink, moist, clear.  Good dental repair. Neck: No JVD.  No masses.  No thyromegaly. Lungs: Clear bilaterally.  Shortness of breath or cough. Heart: RRR.  No MRG.  S1, S2 present.  Pacemaker positioned in upper left sternum, incision site healed nicely. Abdomen: Soft.  Not tender or distended.  No masses, HSM, bruits, hernias.  Active bowel sounds..   Rectal: Not performed digital exam.  Bowel movement in the toilet is formed, dark in color and leaching out blood into the commode water.  Looks more like old blood Musc/Skeltl: No joint redness or swelling.  Knee replacement scars bilaterally. Extremities: No CCE. Neurologic: Moves all 4 limbs.  Able to stand and walk without assistance.  No tremor.  Alert.  Oriented x3.  Memory for details of her medical history foggy. Skin: Healing small abscesses, in the upper mid thigh approaching the groin, 1 on the right, one on left.  These do not look infected. Tattoos: None Nodes: No cervical adenopathy. Psych: Cooperative, pleasant.  Fluid speech.  Intake/Output from previous day: 06/17 0701 - 06/18 0700 In: 999.8 [IV Piggyback:999.8] Out: -  Intake/Output this shift: No intake/output data recorded.  LAB RESULTS: Recent Labs    08/31/17 1325 08/31/17 2010 09/01/17 0539  WBC 7.7  --  7.6  HGB 13.0 11.1* 11.0*  HCT 40.9 34.6* 33.8*  PLT 303  --  241   BMET Lab Results  Component Value Date   NA 141 08/31/2017   NA 142 05/05/2017   NA 139 02/25/2017   K 3.7 08/31/2017   K 4.1 05/05/2017   K 3.1 (L) 02/25/2017   CL 104 08/31/2017   CL 108 05/05/2017   CL 104 02/25/2017   CO2 28 08/31/2017   CO2 24 05/05/2017   CO2 24 02/25/2017   GLUCOSE 186 (H) 08/31/2017   GLUCOSE 102 (H) 05/05/2017   GLUCOSE 158 (H) 02/25/2017   BUN 17 08/31/2017   BUN 14 05/05/2017   BUN 10 02/25/2017   CREATININE 1.01 (H) 08/31/2017   CREATININE  0.87 05/05/2017   CREATININE 0.96 02/25/2017   CALCIUM 9.0 08/31/2017   CALCIUM 8.9 05/05/2017   CALCIUM 9.5 02/25/2017   LFT Recent Labs    08/31/17 1325  PROT 6.3*  ALBUMIN 3.5  AST 23  ALT 14  ALKPHOS 41  BILITOT 0.5   PT/INR Lab Results  Component Value Date   INR 1.08 08/31/2017    RADIOLOGY STUDIES: Ct Abdomen Pelvis W Contrast  Result Date: 08/31/2017 CLINICAL DATA:  84-year-old female with painless bright red blood per rectum beginning this morning. EXAM: CT ABDOMEN AND PELVIS WITH CONTRAST TECHNIQUE: Multidetector CT imaging of the abdomen and pelvis was performed using the standard protocol following bolus administration of intravenous contrast. CONTRAST:  100mL OMNIPAQUE IOHEXOL 300 MG/ML  SOLN COMPARISON:  CT Abdomen and Pelvis 02/25/2017. FINDINGS: Lower chest: Stable cardiomegaly. Cardiac pacemaker or AICD leads. Calcified aortic atherosclerosis. No pericardial effusion. Stable 6-7   millimeter medial right lung base nodule on series 5, image 33. Chronic lung base scarring. No acute pulmonary opacity. No pleural effusion. Hepatobiliary: Surgically absent gallbladder. Liver and biliary tree are within normal limits. Pancreas: Negative. Spleen: Negative. Adrenals/Urinary Tract: Normal adrenal glands. Lobulated 6 centimeter simple fluid density cystic lesion of the posterior right kidney appears benign. Otherwise symmetric and normal bilateral renal enhancement and contrast excretion. No hydronephrosis today. Normal proximal ureters. Mildly to moderately distended but otherwise unremarkable urinary bladder. Stomach/Bowel: Lobular hyperdense material mixed with stool in the rectum might be blood products (series 3, image 73). No definite rectal wall thickening or mesenteric inflammation. Diverticulosis throughout the sigmoid colon. Indistinct appearance of the sigmoid wall on series 6, image 51, but otherwise no convincing active inflammation. Diverticulosis continues into the  descending colon, no active inflammation. Negative splenic flexure and transverse colon. Negative hepatic flexure, right colon, and appendix. Negative terminal ileum. No dilated or abnormal small bowel loops. Decompressed stomach and duodenum. Chronic small duodenal diverticulum in the midline appears stable and inconsequential. No abdominal free air, free fluid. Vascular/Lymphatic: Aortoiliac calcified atherosclerosis. The major arterial structures in the abdomen and pelvis are patent. Portal venous system is patent. No lymphadenopathy. Reproductive: Surgically absent uterus. Diminutive or absent ovaries. Other: No pelvic free fluid. Musculoskeletal: Stable.  No acute osseous abnormality identified. IMPRESSION: 1. Diverticulosis of the descending and sigmoid colon with possible mild sigmoid wall inflammation. Questionable hyperdense blood products mixed with stool in the rectum. Consider a diverticular bleeding source. 2. No other acute or inflammatory process identified in the abdomen or pelvis. 3.  Aortic Atherosclerosis (ICD10-I70.0). Electronically Signed   By: H  Hall M.D.   On: 08/31/2017 18:08     IMPRESSION:   *   Painless hematochezia.  CT establishes presence of colonic diverticulosis.   Sigmoid thickening on CT not convincing for infection.  No previous colonoscopy.    *   Mild blood loss anemia.    *  Cardiac pacemaker placed for CHB in 04/2017.  *   Hyperglycemia.    PLAN:     *   Allow clears.   Stop Cipro and Flagyl.  q 12 hour CBC.    *   Proceed to colonoscopy tomorrow?  Will d/w Dr Shareen Capwell.  Pt willing to proceed if MD recommends.    Sarah Gribbin  09/01/2017, 8:07 AM Phone [phone]     Attending physician's note   I have taken a history, examined the patient and reviewed the chart. I agree with the Advanced Practitioner's note, impression and recommendations. Painless hematochezia, mild ABL anemia. Suspected diverticular bleed. Colonoscopy tomorrow to evaluate for  other causes of bleeding. No clinical evidence of diverticulitis so will stop antibiotics. Clear liquids. Trend CBC.    Wilmont Olund, MD FACG [phone] office     

## 2017-09-01 NOTE — Progress Notes (Signed)
I was informed by central tele that the patient had a 6 beat run ov VTach. Pt vital signs are stable (see flowsheet for vitals). No complaints of of CP. On call MD text paged. Currently waiting on MD response.

## 2017-09-01 NOTE — Consult Note (Addendum)
[facility] Consult: 8:07 AM 09/01/2017  LOS: 0 days    Referring Provider: Dr Tawanna Solo  Primary Care Physician:  Inda Coke, [place] [facility] [place] Primary Gastroenterologist:  Althia Forts.  None.     Reason for Consultation:  Painless hematochezia.     HPI: Madison White is a 84 y.o. female.  PMH/PSH outlined below. S/p cardiac pacemaker 04/2017 for CHB.  Class 2 CHF.  Followed up with Dr Lovena Le on 08/03/17 and felt to be doing well. On 81 mg ASA No previous colonoscopy or EGD but pt says she did a stool test involving a box, submitted a stool specimen.  An RN note from 01/07/2016 states they were ordering a Cologuard test.  No result from this found.     ~ 4 episodes of painless hematochezia at home from 10 AM to noon.  Initially blood mixed with brown stool.  The others were pure blood.  + dizziness with standing.  Strained to have BM ~ 4 days prior but no straining with yesterdays bleeding.  After arriving in the ED in the afternoon she had no further episodes until early this morning.  She feels well.  She still has no abdominal pain.  No nausea or vomiting.  Patient has never seen blood even in scant amounts before.  She has occasional constipation which she manages by eating fruit, prunes are especially effective.  Last week she was a little more constipated than normal but this week bowel movements have been daily per usual.  Several days ago she noticed small, bilateral skin eruptions, abscesses/furuncles in the region of her upper thigh/groin.  She treated these with Neosporin.  These are healing.  Patient has not had fever, chills, sweats.   Hgb 13 >> 11. Was 14.0 in 02/2017, 13 in 04/2017.  MCV 88.    Coags, platelets normal.   CMET with Glucose 186.  BUN/Creat 17/1.0.  CT with contrast: left sided  diverticulosis, ? Blood mixed with stool in rectum.  ? Mild sigmoid thickening.    Patient states she was advised by her orthopedic surgeon to have prophylactic antibiotics before dental work and colonoscopies.  However she also believes she is still taking antibiotic Macrodantin RXd after PPM placed but she is not according to family's records.  Was unaware/confused as to date of when the pacemaker was placed.  Her dtr sets up pt's pill box.     Past Medical History:  Diagnosis Date  . Diastolic dysfunction, left ventricle   . GERD (gastroesophageal reflux disease)   . 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  [name] History of blood transfusion   . History of rheumatic fever   . HTN (hypertension)    hx of refusing treatment--White coat HTN and/or situational HTN (?)   . Hyperlipidemia    hx of refusing treatment  . Insulin  resistance    A1c's excellent per old records (6.1 in 2008 and 2009)  . Left bundle branch block 12/2011   Dr. Sallyanne Kuster at [facility]; ECHO and myocardial perfusion scan showed  septal and apical wall motion abnormality but she had no significant valvular disease and no ischemia.  She did have mildly decreased EF (39% on lexiscan and 50% on echo) and abnl LV relaxation.  Mild amount of PVCs.  Pt at higher risk for other conduction abnormalities, good chance of eventually requiring a pacemaker.   [name] LVH (left ventricular hypertrophy)   . Nonischemic cardiomyopathy ([place]) 2016   LV dysfunction due to LBBB/septal dyssynchrony  . Osteoarthritis    bilat knees--needs bilat TKA.  Ortho is trying steroid injections as of 10/23/15.  . Presence of permanent cardiac pacemaker 05/05/2017  . Seasonal allergies   . Solitary pulmonary nodule 05/2016   8 mm pleural  based nodule in RLL.  Radiology recommended f/u noncontrast CT in 6-12 mo.    Past Surgical History:  Procedure Laterality Date  . ABDOMINAL HYSTERECTOMY  1963   At the time of her last C/S; says she had partial bladder resection at that time as well  . BI-VENTRICULAR PACEMAKER INSERTION (CRT-P)  05/05/2017  . BIV PACEMAKER INSERTION CRT-P N/A 05/05/2017   Procedure: BIV PACEMAKER INSERTION CRT-P;  Surgeon: Evans Lance, MD;  Location: [facility];  Service: Cardiovascular;  Laterality: N/A;  . CARDIOVASCULAR STRESS TEST  08/2005 & 12/2011   Low risk scans (on the 12/2011 scan she did have EF 39% with moderately severe LV dysfunction with septal dyssynergy probably contributed by LBBB  . Carotid dopplers  11/22/14; 11/30/16   NORMAL 2016 and 2018  . CATARACT EXTRACTION  08/14/11   both  . CESAREAN SECTION  X 5   One of her neonates died soon after birth  . CHOLECYSTECTOMY     1980's  . INSERT / REPLACE / REMOVE PACEMAKER    . JOINT REPLACEMENT Bilateral    knee  . right knee surgery Right 05/2016   ? R TKA: no records.  [name] STRABISMUS SURGERY  1939  . TONSILLECTOMY  1938  . TRANSTHORACIC ECHOCARDIOGRAM  01/13/12   Septal dyssynergy, EF 50%, LV relaxation impaired.  No significant valvular abnormalities.    Prior to Admission medications   Medication Sig Start Date End Date Taking? Authorizing Provider  metoprolol tartrate (LOPRESSOR) 25 MG tablet Take 1 tablet (25 mg total) by mouth 2 (two) times daily. Patient taking differently: Take 25 mg by mouth daily.  05/06/17  Yes Baldwin Jamaica, PA-C  aspirin 81 MG tablet Take 1 tablet (81 mg total) by mouth daily. Patient not taking: Reported on 08/31/2017 06/17/12   Tammi Sou, MD  nitrofurantoin, macrocrystal-monohydrate, (MACROBID) 100 MG capsule Take 1 capsule (100 mg total) by mouth 2 (two) times daily. Patient not taking: Reported on 08/31/2017 05/08/17   Evans Lance, MD    Scheduled Meds:  Infusions: .  ciprofloxacin Stopped (09/01/17 0011)  . metronidazole 500 mg (09/01/17 0627)   PRN Meds: ondansetron **OR** ondansetron (ZOFRAN) IV   Allergies as of 08/31/2017 - Review Complete 08/31/2017  Allergen Reaction Noted  . Penicillins Hives and Swelling 05/28/2011  . Nickel Swelling and Rash 04/29/2016  . Avapro [irbesartan] Other (See Comments) 06/06/2011  . Lisinopril Swelling 05/31/2011  . Simvastatin Other (See Comments) 01/21/2012  . Latex Rash 04/29/2016  . Sulfa antibiotics Rash 05/28/2011  . Sulfamethoxazole Rash 04/29/2016  Family History  Problem Relation Age of Onset  . Hypertension Mother   . Heart disease Mother   . Hyperlipidemia Mother   . Diabetes Mother   . Heart disease Father   . Cancer Father     Social History   Socioeconomic History  . Marital status: Widowed    Spouse name: Not on file  . Number of children: Not on file  . Years of education: Not on file  . Highest education level: Not on file  Occupational History  . Not on file  Social Needs  . Financial resource strain: Not on file  . Food insecurity:    Worry: Not on file    Inability: Not on file  . Transportation needs:    Medical: Not on file    Non-medical: Not on file  Tobacco Use  . Smoking status: Former Smoker    Last attempt to quit: 05/31/1958    Years since quitting: 59.2  . Smokeless tobacco: Never Used  Substance and Sexual Activity  . Alcohol use: Yes    Alcohol/week: 0.6 oz    Types: 1 Glasses of wine per week  . Drug use: No  . Sexual activity: Not on file  Lifestyle  . Physical activity:    Days per week: Not on file    Minutes per session: Not on file  . Stress: Not on file  Relationships  . Social connections:    Talks on phone: Not on file    Gets together: Not on file    Attends religious service: Not on file    Active member of club or organization: Not on file    Attends meetings of clubs or organizations: Not on file    Relationship status: Not on  file  . Intimate partner violence:    Fear of current or ex partner: Not on file    Emotionally abused: Not on file    Physically abused: Not on file    Forced sexual activity: Not on file  Other Topics Concern  . Not on file  Social History Narrative   Widower, husband died around 54 (wrongful death per pt's report).   Originally from [place], has been in [place] since about 1997.   Lives alone.  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   Fairly active and independent.   Distant hx of tobacco abuse.  No alcohol or drugs.    REVIEW OF SYSTEMS: Constitutional: Generally has pretty good strength.  She is able to walk and climb stairs. ENT:  No nose bleeds Pulm: No shortness of breath.  No cough. CV:  No chest pain, no LE edema.  Patient checked out her heart rate monitor yesterday after she bled and she noticed that it looked a bit irregular. GU:  No hematuria, no frequency GI:  Per HPI.  No GER sxs, no dysphagia, no anorexia. Heme:  No unusual bruising. Transfusions: Decades ago she had blood transfusion after childbirth. Neuro:  No headaches, no peripheral tingling or numbness.  No seizures.  Loss of limb function. Derm: Skin eruptions on the upper thigh are mostly healed. Endocrine:  No sweats or chills.  No polyuria or dysuria Immunization: Reviewed. Travel:  None beyond local counties in last few months.    PHYSICAL EXAM: Vital signs in last 24 hours: Vitals:   09/01/17 0524 09/01/17 0600  BP: (!) 143/51 (!) 152/52  Pulse: 72 72  Resp: 16 16  Temp: 97.7 F (36.5 C)  98.2 F (36.8 C)  SpO2: 95% 92%   Wt Readings from Last 3 Encounters:  08/31/17 139 lb 8.8 oz (63.3 kg)  08/03/17 140 lb (63.5 kg)  06/17/17 139 lb (63 kg)    General: Patient looks well.  She appears younger than stated age.  Comfortable.  Not ill looking Head: No facial asymmetry or swelling.  No signs of head trauma. Eyes: No scleral icterus.  No conjunctival pallor.   EOMI. Ears: Slightly hard of hearing. Nose: No discharge or congestion. Mouth: Tongue midline.  Oral mucosa pink, moist, clear.  Good dental repair. Neck: No JVD.  No masses.  No thyromegaly. Lungs: Clear bilaterally.  Shortness of breath or cough. Heart: RRR.  No MRG.  S1, S2 present.  Pacemaker positioned in upper left sternum, incision site healed nicely. Abdomen: Soft.  Not tender or distended.  No masses, HSM, bruits, hernias.  Active bowel sounds..   Rectal: Not performed digital exam.  Bowel movement in the toilet is formed, dark in color and leaching out blood into the commode water.  Looks more like old blood Musc/Skeltl: No joint redness or swelling.  Knee replacement scars bilaterally. Extremities: No CCE. Neurologic: Moves all 4 limbs.  Able to stand and walk without assistance.  No tremor.  Alert.  Oriented x3.  Memory for details of her medical history foggy. Skin: Healing small abscesses, in the upper mid thigh approaching the groin, 1 on the right, one on left.  These do not look infected. Tattoos: None Nodes: No cervical adenopathy. Psych: Cooperative, pleasant.  Fluid speech.  Intake/Output from previous day: 06/17 0701 - 06/18 0700 In: 999.8 [IV Piggyback:999.8] Out: -  Intake/Output this shift: No intake/output data recorded.  LAB RESULTS: Recent Labs    08/31/17 1325 08/31/17 2010 09/01/17 0539  WBC 7.7  --  7.6  HGB 13.0 11.1* 11.0*  HCT 40.9 34.6* 33.8*  PLT 303  --  241   BMET Lab Results  Component Value Date   NA 141 08/31/2017   NA 142 05/05/2017   NA 139 02/25/2017   K 3.7 08/31/2017   K 4.1 05/05/2017   K 3.1 (L) 02/25/2017   CL 104 08/31/2017   CL 108 05/05/2017   CL 104 02/25/2017   CO2 28 08/31/2017   CO2 24 05/05/2017   CO2 24 02/25/2017   GLUCOSE 186 (H) 08/31/2017   GLUCOSE 102 (H) 05/05/2017   GLUCOSE 158 (H) 02/25/2017   BUN 17 08/31/2017   BUN 14 05/05/2017   BUN 10 02/25/2017   CREATININE 1.01 (H) 08/31/2017   CREATININE  0.87 05/05/2017   CREATININE 0.96 02/25/2017   CALCIUM 9.0 08/31/2017   CALCIUM 8.9 05/05/2017   CALCIUM 9.5 02/25/2017   LFT Recent Labs    08/31/17 1325  PROT 6.3*  ALBUMIN 3.5  AST 23  ALT 14  ALKPHOS 41  BILITOT 0.5   PT/INR Lab Results  Component Value Date   INR 1.08 08/31/2017    RADIOLOGY STUDIES: Ct Abdomen Pelvis W Contrast  Result Date: 08/31/2017 CLINICAL DATA:  84 year old female with painless bright red blood per rectum beginning this morning. EXAM: CT ABDOMEN AND PELVIS WITH CONTRAST TECHNIQUE: Multidetector CT imaging of the abdomen and pelvis was performed using the standard protocol following bolus administration of intravenous contrast. CONTRAST:  128mL OMNIPAQUE IOHEXOL 300 MG/ML  SOLN COMPARISON:  CT Abdomen and Pelvis 02/25/2017. FINDINGS: Lower chest: Stable cardiomegaly. Cardiac pacemaker or AICD leads. Calcified aortic atherosclerosis. No pericardial effusion. Stable 6-7  millimeter medial right lung base nodule on series 5, image 33. Chronic lung base scarring. No acute pulmonary opacity. No pleural effusion. Hepatobiliary: Surgically absent gallbladder. Liver and biliary tree are within normal limits. Pancreas: Negative. Spleen: Negative. Adrenals/Urinary Tract: Normal adrenal glands. Lobulated 6 centimeter simple fluid density cystic lesion of the posterior right kidney appears benign. Otherwise symmetric and normal bilateral renal enhancement and contrast excretion. No hydronephrosis today. Normal proximal ureters. Mildly to moderately distended but otherwise unremarkable urinary bladder. Stomach/Bowel: Lobular hyperdense material mixed with stool in the rectum might be blood products (series 3, image 73). No definite rectal wall thickening or mesenteric inflammation. Diverticulosis throughout the sigmoid colon. Indistinct appearance of the sigmoid wall on series 6, image 51, but otherwise no convincing active inflammation. Diverticulosis continues into the  descending colon, no active inflammation. Negative splenic flexure and transverse colon. Negative hepatic flexure, right colon, and appendix. Negative terminal ileum. No dilated or abnormal small bowel loops. Decompressed stomach and duodenum. Chronic small duodenal diverticulum in the midline appears stable and inconsequential. No abdominal free air, free fluid. Vascular/Lymphatic: Aortoiliac calcified atherosclerosis. The major arterial structures in the abdomen and pelvis are patent. Portal venous system is patent. No lymphadenopathy. Reproductive: Surgically absent uterus. Diminutive or absent ovaries. Other: No pelvic free fluid. Musculoskeletal: Stable.  No acute osseous abnormality identified. IMPRESSION: 1. Diverticulosis of the descending and sigmoid colon with possible mild sigmoid wall inflammation. Questionable hyperdense blood products mixed with stool in the rectum. Consider a diverticular bleeding source. 2. No other acute or inflammatory process identified in the abdomen or pelvis. 3.  Aortic Atherosclerosis (ICD10-I70.0). Electronically Signed   By: Genevie Ann M.D.   On: 08/31/2017 18:08     IMPRESSION:   *   Painless hematochezia.  CT establishes presence of colonic diverticulosis.   Sigmoid thickening on CT not convincing for infection.  No previous colonoscopy.    *   Mild blood loss anemia.    *  Cardiac pacemaker placed for CHB in 04/2017.  *   Hyperglycemia.    PLAN:     *   Allow clears.   Stop Cipro and Flagyl.  q 12 hour CBC.    *   Proceed to colonoscopy tomorrow?  Will d/w Dr Fuller Plan.  Pt willing to proceed if MD recommends.    Azucena Freed  09/01/2017, 8:07 AM Phone [phone]     Attending physician's note   I have taken a history, examined the patient and reviewed the chart. I agree with the Advanced Practitioner's note, impression and recommendations. Painless hematochezia, mild ABL anemia. Suspected diverticular bleed. Colonoscopy tomorrow to evaluate for  other causes of bleeding. No clinical evidence of diverticulitis so will stop antibiotics. Clear liquids. Trend CBC.    Lucio Edward, MD FACG [phone] office

## 2017-09-02 ENCOUNTER — Observation Stay (HOSPITAL_COMMUNITY): Payer: Medicare HMO

## 2017-09-02 ENCOUNTER — Encounter (HOSPITAL_COMMUNITY)
Admission: EM | Disposition: A | Discharge status: Home / Self Care | Payer: Self-pay | Source: Home / Self Care | Attending: Emergency Medicine

## 2017-09-02 ENCOUNTER — Encounter (HOSPITAL_COMMUNITY): Payer: Self-pay

## 2017-09-02 DIAGNOSIS — K921 Melena: Secondary | ICD-10-CM

## 2017-09-02 DIAGNOSIS — I442 Atrioventricular block, complete: Secondary | ICD-10-CM | POA: Diagnosis not present

## 2017-09-02 DIAGNOSIS — I10 Essential (primary) hypertension: Secondary | ICD-10-CM | POA: Diagnosis not present

## 2017-09-02 DIAGNOSIS — K922 Gastrointestinal hemorrhage, unspecified: Secondary | ICD-10-CM | POA: Diagnosis not present

## 2017-09-02 DIAGNOSIS — E1165 Type 2 diabetes mellitus with hyperglycemia: Secondary | ICD-10-CM | POA: Diagnosis not present

## 2017-09-02 DIAGNOSIS — K5731 Diverticulosis of large intestine without perforation or abscess with bleeding: Secondary | ICD-10-CM | POA: Diagnosis not present

## 2017-09-02 DIAGNOSIS — Z87891 Personal history of nicotine dependence: Secondary | ICD-10-CM | POA: Diagnosis not present

## 2017-09-02 DIAGNOSIS — K5790 Diverticulosis of intestine, part unspecified, without perforation or abscess without bleeding: Secondary | ICD-10-CM | POA: Diagnosis not present

## 2017-09-02 DIAGNOSIS — Q2733 Arteriovenous malformation of digestive system vessel: Secondary | ICD-10-CM | POA: Diagnosis not present

## 2017-09-02 DIAGNOSIS — I429 Cardiomyopathy, unspecified: Secondary | ICD-10-CM | POA: Diagnosis not present

## 2017-09-02 DIAGNOSIS — K5520 Angiodysplasia of colon without hemorrhage: Secondary | ICD-10-CM

## 2017-09-02 DIAGNOSIS — D62 Acute posthemorrhagic anemia: Secondary | ICD-10-CM | POA: Diagnosis not present

## 2017-09-02 DIAGNOSIS — K219 Gastro-esophageal reflux disease without esophagitis: Secondary | ICD-10-CM | POA: Diagnosis not present

## 2017-09-02 DIAGNOSIS — K5730 Diverticulosis of large intestine without perforation or abscess without bleeding: Secondary | ICD-10-CM

## 2017-09-02 DIAGNOSIS — R911 Solitary pulmonary nodule: Secondary | ICD-10-CM | POA: Diagnosis not present

## 2017-09-02 DIAGNOSIS — K5521 Angiodysplasia of colon with hemorrhage: Secondary | ICD-10-CM | POA: Diagnosis not present

## 2017-09-02 DIAGNOSIS — D500 Iron deficiency anemia secondary to blood loss (chronic): Secondary | ICD-10-CM | POA: Diagnosis not present

## 2017-09-02 HISTORY — PX: HOT HEMOSTASIS: SHX5433

## 2017-09-02 HISTORY — PX: COLONOSCOPY WITH PROPOFOL: SHX5780

## 2017-09-02 LAB — CBC WITH DIFFERENTIAL/PLATELET
ABS IMMATURE GRANULOCYTES: 0 10*3/uL (ref 0.0–0.1)
BASOS ABS: 0.1 10*3/uL (ref 0.0–0.1)
BASOS PCT: 1 %
EOS PCT: 6 %
Eosinophils Absolute: 0.4 10*3/uL (ref 0.0–0.7)
HCT: 37.5 % (ref 36.0–46.0)
Hemoglobin: 12.1 g/dL (ref 12.0–15.0)
Immature Granulocytes: 0 %
Lymphocytes Relative: 24 %
Lymphs Abs: 1.7 10*3/uL (ref 0.7–4.0)
MCH: 28.8 pg (ref 26.0–34.0)
MCHC: 32.3 g/dL (ref 30.0–36.0)
MCV: 89.3 fL (ref 78.0–100.0)
MONO ABS: 0.6 10*3/uL (ref 0.1–1.0)
Monocytes Relative: 8 %
NEUTROS ABS: 4.2 10*3/uL (ref 1.7–7.7)
Neutrophils Relative %: 61 %
PLATELETS: 267 10*3/uL (ref 150–400)
RBC: 4.2 MIL/uL (ref 3.87–5.11)
RDW: 13.3 % (ref 11.5–15.5)
WBC: 6.9 10*3/uL (ref 4.0–10.5)

## 2017-09-02 SURGERY — COLONOSCOPY WITH PROPOFOL
Anesthesia: Monitor Anesthesia Care

## 2017-09-02 MED ORDER — PANTOPRAZOLE SODIUM 40 MG PO TBEC: 40.0000 mg | DELAYED_RELEASE_TABLET | Freq: Every day | ORAL | 0 refills | Status: DC

## 2017-09-02 MED ORDER — LIDOCAINE 2% (20 MG/ML) 5 ML SYRINGE
INTRAMUSCULAR | Status: AC
  Filled 2017-09-02: qty 10

## 2017-09-02 MED ORDER — PROPOFOL 10 MG/ML IV BOLUS
INTRAVENOUS | Status: DC | PRN
Start: 2017-09-02 — End: 2017-09-02
  Administered 2017-09-02: 20 mg via INTRAVENOUS

## 2017-09-02 MED ORDER — PROPOFOL 500 MG/50ML IV EMUL
INTRAVENOUS | Status: DC | PRN
  Administered 2017-09-02: 75 ug/kg/min via INTRAVENOUS

## 2017-09-02 MED ORDER — PROPOFOL 10 MG/ML IV BOLUS
INTRAVENOUS | Status: AC
  Filled 2017-09-02: qty 20

## 2017-09-02 MED ORDER — ASPIRIN 81 MG PO TABS: ORAL_TABLET | ORAL | 0 refills | Status: DC

## 2017-09-02 MED ORDER — LACTATED RINGERS IV SOLN
INTRAVENOUS | Status: DC
  Administered 2017-09-02: 1000 mL via INTRAVENOUS

## 2017-09-02 MED ORDER — LIDOCAINE 2% (20 MG/ML) 5 ML SYRINGE
INTRAMUSCULAR | Status: DC | PRN
  Administered 2017-09-02: 40 mg via INTRAVENOUS

## 2017-09-02 MED ORDER — METOPROLOL TARTRATE 25 MG PO TABS
25.0000 mg | ORAL_TABLET | Freq: Two times a day (BID) | ORAL | Status: DC
  Administered 2017-09-02: 25 mg via ORAL
  Filled 2017-09-02: qty 1

## 2017-09-02 SURGICAL SUPPLY — 22 items

## 2017-09-02 NOTE — Anesthesia Postprocedure Evaluation (Signed)
Anesthesia Post Note  Patient: MYRENE BOUGHER  Procedure(s) Performed: COLONOSCOPY WITH PROPOFOL (N/A ) HOT HEMOSTASIS (ARGON PLASMA COAGULATION/BICAP) (N/A )     Patient location during evaluation: Endoscopy Anesthesia Type: MAC Level of consciousness: awake and alert Pain management: pain level controlled Vital Signs Assessment: post-procedure vital signs reviewed and stable Respiratory status: spontaneous breathing, nonlabored ventilation, respiratory function stable and patient connected to nasal cannula oxygen Cardiovascular status: stable and blood pressure returned to baseline Postop Assessment: no apparent nausea or vomiting Anesthetic complications: no    Last Vitals:  Vitals:   09/02/17 1249 09/02/17 1420  BP: (!) 151/96 (!) 171/60  Pulse: 73 73  Resp: 13 16  Temp:  36.6 C  SpO2: 95% 98%    Last Pain:  Vitals:   09/02/17 1420  TempSrc: Oral  PainSc:                  Myiesha Edgar

## 2017-09-02 NOTE — Transfer of Care (Signed)
Immediate Anesthesia Transfer of Care Note  Patient: Madison White  Procedure(s) Performed: COLONOSCOPY WITH PROPOFOL (N/A ) HOT HEMOSTASIS (ARGON PLASMA COAGULATION/BICAP) (N/A )  Patient Location: Endoscopy Unit  Anesthesia Type:MAC  Level of Consciousness: awake, alert  and oriented  Airway & Oxygen Therapy: Patient Spontanous Breathing  Post-op Assessment: Report given to RN and Post -op Vital signs reviewed and stable  Post vital signs: Reviewed and stable  Last Vitals:  Vitals Value Taken Time  BP 156/34 09/02/2017 12:35 PM  Temp    Pulse 66 09/02/2017 12:36 PM  Resp 17 09/02/2017 12:36 PM  SpO2 98 % 09/02/2017 12:36 PM  Vitals shown include unvalidated device data.  Last Pain:  Vitals:   09/02/17 1235  TempSrc:   PainSc: 0-No pain         Complications: No apparent anesthesia complications

## 2017-09-02 NOTE — Progress Notes (Signed)
Madison White to be D/C'd home per MD order. Discussed with the patient and all questions fully answered. VVS, Skin clean, dry and intact without evidence of skin break down, no evidence of skin tears noted.  IV catheter discontinued intact. Site without signs and symptoms of complications. Dressing and pressure applied.  An After Visit Summary and AVS were printed and given to the patient.  Patient escorted via [place], and D/C home via private auto.  Melonie Florida  09/02/2017 4:28 PM

## 2017-09-02 NOTE — Progress Notes (Signed)
Patient's BP 171/60. MD notified. No new orders received at this time.

## 2017-09-02 NOTE — Discharge Summary (Signed)
Physician Discharge Summary  Madison White DVV:[id number] DOB: Dec 26, 1928 DOA: 08/31/2017  PCP: Inda Coke, PA  Admit date: 08/31/2017 Discharge date: 09/02/2017  Admitted From: Home Disposition:  Home  Discharge Condition:Stable CODE STATUS:FULL Diet recommendation: Heart Healthy   Brief/Interim Summary:  Patient is a 84 year old female with past medical history of hypertension, complete heart block status post permanent pacemaker placement, nonischemic cardiomyopathy who presented to the emergency department with complaints of 4 episodes of bright red blood per rectum.  Patient was on aspirin at home. Patient was admitted for the management of GI bleed. GI  consulted.    Underwent colonoscopy today.  Her H&H is stable.  She is stable for discharge home today.  Colonoscopy report as below.  Following problems were addressed during her hospitalization:  Lower GI bleed: Hemoglobin around baseline.    She underwent colonoscopy. Found to have a single non-bleeding colonic angiodysplastic  lesion. Treated with argon plasma coagulation .Two non-bleeding colonic angiodysplastic lesion were also treated with argon plasma coagulation .Diverticulosis in the left colon. Continue PPI on discharge.  Hold aspirin for 2-3 more days.  Questionable sigmoid diverticulitis: CT done in the emergency department showed diverticulosis but also possible mild sigmoid inflammation.  She was initially started on antibiotics.  Patient does not complain of any abdominal pain.  Says she does not have any leukocytosis or fever.    Discontinue antibiotics.  Prolonged QTC: She is not on any QT prolonging medication.  Potassium and magnesium level normal.  QTC prolonged at 548 today.    Will recommend to avoid QT prolongation drugs.  Follow-up EKG as an outpatient during follow-up with PCP.  History of nonischemic cardiomyopathy/complete heart block: Status post pacemaker placement on 05/05/2017 by Dr. Lovena Le.   Echo in 2013 showed ejection fraction 50%.  History of hypertension: Currently blood pressure stable.    HTN: Home blood pressure medications will be resumed.   Discharge Diagnoses:  Principal Problem:   GI bleed Active Problems:   HTN (hypertension), benign   Nonischemic cardiomyopathy (HCC)   Complete heart block (HCC)   Acute lower GI bleeding   Hematochezia    Discharge Instructions  Discharge Instructions    Diet - low sodium heart healthy   Complete by:  As directed    Discharge instructions   Complete by:  As directed    1) Take prescribed medications as instructed. 2) Follow up with your PCP in a week do a CBC test during the follow-up.  Do an EKG to check QT interval during the follow-up. 3) Dont take aspirin,NSAIDs or any other blood thinners for 3 more days.   Increase activity slowly   Complete by:  As directed      Allergies as of 09/02/2017      Reactions   Penicillins Hives, Swelling   Has patient had a PCN reaction causing immediate rash, facial/tongue/throat swelling, SOB or lightheadedness with hypotension: Yes Has patient had a PCN reaction causing severe rash involving mucus membranes or skin necrosis: No Has patient had a PCN reaction that required hospitalization: No Has patient had a PCN reaction occurring within the last 10 years: No If all of the above answers are "NO", then may proceed with Cephalosporin use.   Nickel Swelling, Rash   Avapro [irbesartan] Other (See Comments)   Unknown rxn   Lisinopril Swelling   Swelling around eyes; also says it caused increased sugar and BP   Simvastatin Other (See Comments)   unknown   Latex Rash  Sulfa Antibiotics Rash   Sulfamethoxazole Rash      Medication List    STOP taking these medications   nitrofurantoin (macrocrystal-monohydrate) 100 MG capsule Commonly known as:  MACROBID     TAKE these medications   aspirin 81 MG tablet Take only after 3 days . What changed:    how much to  take  how to take this  when to take this  additional instructions   metoprolol tartrate 25 MG tablet Commonly known as:  LOPRESSOR Take 1 tablet (25 mg total) by mouth 2 (two) times daily. What changed:  when to take this   pantoprazole 40 MG tablet Commonly known as:  PROTONIX Take 1 tablet (40 mg total) by mouth daily. Start taking on:  09/03/2017      Follow-up Information    Inda Coke, [place]. Schedule an appointment as soon as possible for a visit in 1 week(s).   Specialty:  Physician Assistant Contact information: [place] [address] [phone]          Allergies  Allergen Reactions  . Penicillins Hives and Swelling    Has patient had a PCN reaction causing immediate rash, facial/tongue/throat swelling, SOB or lightheadedness with hypotension: Yes Has patient had a PCN reaction causing severe rash involving mucus membranes or skin necrosis: No Has patient had a PCN reaction that required hospitalization: No Has patient had a PCN reaction occurring within the last 10 years: No If all of the above answers are "NO", then may proceed with Cephalosporin use.   . Nickel Swelling and Rash  . Avapro [Irbesartan] Other (See Comments)    Unknown rxn  . Lisinopril Swelling    Swelling around eyes; also says it caused increased sugar and BP  . Simvastatin Other (See Comments)    unknown  . Latex Rash  . Sulfa Antibiotics Rash  . Sulfamethoxazole Rash    Consultations:  GI   Procedures/Studies: Ct Abdomen Pelvis W Contrast  Result Date: 08/31/2017 CLINICAL DATA:  84 year old female with painless bright red blood per rectum beginning this morning. EXAM: CT ABDOMEN AND PELVIS WITH CONTRAST TECHNIQUE: Multidetector CT imaging of the abdomen and pelvis was performed using the standard protocol following bolus administration of intravenous contrast. CONTRAST:  173mL OMNIPAQUE IOHEXOL 300 MG/ML  SOLN COMPARISON:  CT Abdomen and Pelvis  02/25/2017. FINDINGS: Lower chest: Stable cardiomegaly. Cardiac pacemaker or AICD leads. Calcified aortic atherosclerosis. No pericardial effusion. Stable 6-7 millimeter medial right lung base nodule on series 5, image 33. Chronic lung base scarring. No acute pulmonary opacity. No pleural effusion. Hepatobiliary: Surgically absent gallbladder. Liver and biliary tree are within normal limits. Pancreas: Negative. Spleen: Negative. Adrenals/Urinary Tract: Normal adrenal glands. Lobulated 6 centimeter simple fluid density cystic lesion of the posterior right kidney appears benign. Otherwise symmetric and normal bilateral renal enhancement and contrast excretion. No hydronephrosis today. Normal proximal ureters. Mildly to moderately distended but otherwise unremarkable urinary bladder. Stomach/Bowel: Lobular hyperdense material mixed with stool in the rectum might be blood products (series 3, image 73). No definite rectal wall thickening or mesenteric inflammation. Diverticulosis throughout the sigmoid colon. Indistinct appearance of the sigmoid wall on series 6, image 51, but otherwise no convincing active inflammation. Diverticulosis continues into the descending colon, no active inflammation. Negative splenic flexure and transverse colon. Negative hepatic flexure, right colon, and appendix. Negative terminal ileum. No dilated or abnormal small bowel loops. Decompressed stomach and duodenum. Chronic small duodenal diverticulum in the midline appears stable and inconsequential. No  abdominal free air, free fluid. Vascular/Lymphatic: Aortoiliac calcified atherosclerosis. The major arterial structures in the abdomen and pelvis are patent. Portal venous system is patent. No lymphadenopathy. Reproductive: Surgically absent uterus. Diminutive or absent ovaries. Other: No pelvic free fluid. Musculoskeletal: Stable.  No acute osseous abnormality identified. IMPRESSION: 1. Diverticulosis of the descending and sigmoid colon with  possible mild sigmoid wall inflammation. Questionable hyperdense blood products mixed with stool in the rectum. Consider a diverticular bleeding source. 2. No other acute or inflammatory process identified in the abdomen or pelvis. 3.  Aortic Atherosclerosis (ICD10-I70.0). Electronically Signed   By: Genevie Ann M.D.   On: 08/31/2017 18:08      Subjective: Patient seen and examined the bedside this morning.  Remains comfortable.  No new issues/events.  Lower GI bleed had stopped.  Discharge Exam: Vitals:   09/02/17 1240 09/02/17 1249  BP: (!) 182/52 (!) 151/96  Pulse: 66 73  Resp: (!) 21 13  Temp:    SpO2: 95% 95%   Vitals:   09/02/17 1024 09/02/17 1235 09/02/17 1240 09/02/17 1249  BP: (!) 203/51 (!) 156/34 (!) 182/52 (!) 151/96  Pulse:  65 66 73  Resp:  17 (!) 21 13  Temp:  97.6 F (36.4 C)    TempSrc:  Oral    SpO2:  98% 95% 95%  Weight:      Height:        General: Pt is alert, awake, not in acute distress Cardiovascular: RRR, S1/S2 +, no rubs, no gallops Respiratory: CTA bilaterally, no wheezing, no rhonchi Abdominal: Soft, NT, ND, bowel sounds + Extremities: no edema, no cyanosis    The results of significant diagnostics from this hospitalization (including imaging, microbiology, ancillary and laboratory) are listed below for reference.     Microbiology: No results found for this or any previous visit (from the past 240 hour(s)).   Labs: BNP (last 3 results) Recent Labs    05/05/17 1204  BNP 703.5*   Basic Metabolic Panel: Recent Labs  Lab 08/31/17 1325 08/31/17 2010 09/01/17 0900  NA 141  --  143  K 3.7  --  4.3  CL 104  --  109  CO2 28  --  28  GLUCOSE 186*  --  101*  BUN 17  --  10  CREATININE 1.01*  --  0.76  CALCIUM 9.0  --  8.8*  MG  --  2.0 2.0   Liver Function Tests: Recent Labs  Lab 08/31/17 1325  AST 23  ALT 14  ALKPHOS 41  BILITOT 0.5  PROT 6.3*  ALBUMIN 3.5   No results for input(s): LIPASE, AMYLASE in the last 168 hours. No  results for input(s): AMMONIA in the last 168 hours. CBC: Recent Labs  Lab 08/31/17 1325 08/31/17 2010 09/01/17 0539 09/02/17 0711  WBC 7.7  --  7.6 6.9  NEUTROABS  --   --   --  4.2  HGB 13.0 11.1* 11.0* 12.1  HCT 40.9 34.6* 33.8* 37.5  MCV 89.1  --  88.5 89.3  PLT 303  --  241 267   Cardiac Enzymes: No results for input(s): CKTOTAL, CKMB, CKMBINDEX, TROPONINI in the last 168 hours. BNP: Invalid input(s): POCBNP CBG: No results for input(s): GLUCAP in the last 168 hours. D-Dimer No results for input(s): DDIMER in the last 72 hours. Hgb A1c No results for input(s): HGBA1C in the last 72 hours. Lipid Profile No results for input(s): CHOL, HDL, LDLCALC, TRIG, CHOLHDL, LDLDIRECT in the last 72  hours. Thyroid function studies No results for input(s): TSH, T4TOTAL, T3FREE, THYROIDAB in the last 72 hours.  Invalid input(s): FREET3 Anemia work up No results for input(s): VITAMINB12, FOLATE, FERRITIN, TIBC, IRON, RETICCTPCT in the last 72 hours. Urinalysis    Component Value Date/Time   COLORURINE YELLOW 08/31/2017 1810   APPEARANCEUR CLEAR 08/31/2017 1810   LABSPEC 1.014 08/31/2017 1810   PHURINE 6.0 08/31/2017 1810   GLUCOSEU NEGATIVE 08/31/2017 1810   HGBUR MODERATE (A) 08/31/2017 1810   BILIRUBINUR NEGATIVE 08/31/2017 1810   BILIRUBINUR Negative 02/04/2017 [place] 08/31/2017 1810   PROTEINUR NEGATIVE 08/31/2017 1810   UROBILINOGEN 0.2 02/04/2017 1041   NITRITE NEGATIVE 08/31/2017 1810   LEUKOCYTESUR TRACE (A) 08/31/2017 1810   Sepsis Labs Invalid input(s): PROCALCITONIN,  WBC,  LACTICIDVEN Microbiology No results found for this or any previous visit (from the past 240 hour(s)).  Please note: You were cared for by a hospitalist during your hospital stay. Once you are discharged, your primary care physician will handle any further medical issues. Please note that NO REFILLS for any discharge medications will be authorized once you are discharged, as  it is imperative that you return to your primary care physician (or establish a relationship with a primary care physician if you do not have one) for your post hospital discharge needs so that they can reassess your need for medications and monitor your lab values.    Time coordinating discharge: 40 minutes  SIGNED:   Shelly Coss, MD  [facility] 09/02/2017, 2:02 PM Pager [phone]  If 7PM-7AM, please contact night-coverage [email or web address] Password [email or web address]

## 2017-09-02 NOTE — Anesthesia Preprocedure Evaluation (Signed)
Anesthesia Evaluation  Patient identified by MRN, date of birth, ID band Patient awake    Reviewed: Allergy & Precautions, NPO status , Patient's Chart, lab work & pertinent test results, reviewed documented beta blocker date and time   History of Anesthesia Complications Negative for: history of anesthetic complications  Airway Mallampati: I  TM Distance: >3 FB Neck ROM: Full    Dental  (+) Teeth Intact   Pulmonary neg shortness of breath, neg sleep apnea, neg COPD, neg recent URI, former smoker,    breath sounds clear to auscultation       Cardiovascular hypertension, Pt. on medications and Pt. on home beta blockers + dysrhythmias + pacemaker  Rhythm:Regular     Neuro/Psych  Headaches, negative psych ROS   GI/Hepatic Neg liver ROS, GERD  Medicated,  Endo/Other  negative endocrine ROS  Renal/GU negative Renal ROS     Musculoskeletal  (+) Arthritis ,   Abdominal   Peds  Hematology negative hematology ROS (+)   Anesthesia Other Findings   Reproductive/Obstetrics                             Anesthesia Physical Anesthesia Plan  ASA: II  Anesthesia Plan: MAC   Post-op Pain Management:    Induction: Intravenous  PONV Risk Score and Plan: 2 and Treatment may vary due to age or medical condition  Airway Management Planned: Nasal Cannula  Additional Equipment:   Intra-op Plan:   Post-operative Plan:   Informed Consent: I have reviewed the patients History and Physical, chart, labs and discussed the procedure including the risks, benefits and alternatives for the proposed anesthesia with the patient or authorized representative who has indicated his/her understanding and acceptance.   Dental advisory given  Plan Discussed with: CRNA and Surgeon  Anesthesia Plan Comments:         Anesthesia Quick Evaluation

## 2017-09-02 NOTE — Anesthesia Procedure Notes (Signed)
Procedure Name: MAC Date/Time: 09/02/2017 12:05 PM Performed by: Candis Shine, CRNA Pre-anesthesia Checklist: Patient identified, Emergency Drugs available, Suction available, Patient being monitored and Timeout performed Patient Re-evaluated:Patient Re-evaluated prior to induction Oxygen Delivery Method: Simple face mask Dental Injury: Teeth and Oropharynx as per pre-operative assessment

## 2017-09-02 NOTE — Interval H&P Note (Signed)
History and Physical Interval Note:  09/02/2017 10:46 AM  Madison White  has presented today for surgery, with the diagnosis of hematochezia.  The various methods of treatment have been discussed with the patient and family. After consideration of risks, benefits and other options for treatment, the patient has consented to  Procedure(s): COLONOSCOPY WITH PROPOFOL (N/A) as a surgical intervention .  The patient's history has been reviewed, patient examined, no change in status, stable for surgery.  I have reviewed the patient's chart and labs.  Questions were answered to the patient's satisfaction.     Pricilla Riffle. Fuller Plan

## 2017-09-02 NOTE — Op Note (Signed)
[facility] Patient Name: Madison White Procedure Date : 09/02/2017 MRN: [id number] Attending MD: Ladene Artist , MD Date of Birth: [birth year] CSN: [id number] Age: 84 Admit Type: Inpatient Procedure:                Colonoscopy Indications:              Hematochezia Providers:                Pricilla Riffle. Fuller Plan, MD, Cleda Daub, RN, Alan Mulder, Technician Referring MD:             [facility] Medicines:                Monitored Anesthesia Care Complications:            No immediate complications. Estimated blood loss:                            None. Estimated Blood Loss:     Estimated blood loss: none. Procedure:                Pre-Anesthesia Assessment:                           - Prior to the procedure, a History and Physical                            was performed, and patient medications and                            allergies were reviewed. The patient's tolerance of                            previous anesthesia was also reviewed. The risks                            and benefits of the procedure and the sedation                            options and risks were discussed with the patient.                            All questions were answered, and informed consent                            was obtained. Prior Anticoagulants: The patient has                            taken no previous anticoagulant or antiplatelet                            agents. ASA Grade Assessment: II - A patient with                            mild systemic disease.  After reviewing the risks                            and benefits, the patient was deemed in                            satisfactory condition to undergo the procedure.                           After obtaining informed consent, the colonoscope                            was passed under direct vision. Throughout the                            procedure, the patient's blood pressure,  pulse, and                            oxygen saturations were monitored continuously. The                            EC-[id number] ([id number]) scope was introduced through                            the anus and advanced to the the cecum, identified                            by appendiceal orifice and ileocecal valve. The                            ileocecal valve, appendiceal orifice, and rectum                            were photographed. The quality of the bowel                            preparation was good. The colonoscopy was performed                            without difficulty. The patient tolerated the                            procedure well. Scope In: 12:12:39 PM Scope Out: 12:28:46 PM Scope Withdrawal Time: 0 hours 13 minutes 8 seconds  Total Procedure Duration: 0 hours 16 minutes 7 seconds  Findings:      The perianal and digital rectal examinations were normal.      A single medium-sized, 8 mm, localized angiodysplastic lesion without       bleeding was found in the cecum. Oozing with APC noted. Coagulation for       bleeding prevention using argon plasma was successful.      Two small localized angiodysplastic lesions without bleeding were found       at the ileocecal valve. Coagulation for bleeding prevention using argon       plasma was successful.  The exam was otherwise without abnormality on direct and retroflexion       views.      Many medium-mouthed diverticula were found in the left colon. Impression:               - A single non-bleeding colonic angiodysplastic                            lesion. Treated with argon plasma coagulation (APC).                           - Two non-bleeding colonic angiodysplastic lesions.                            Treated with argon plasma coagulation (APC).                           - The examination was otherwise normal on direct                            and retroflexion views.                           - Diverticulosis in  the left colon.                           - No specimens collected. Recommendation:           - LGI bleed from diverticulosis or cecal AVM.                           - Patient has a contact number available for                            emergencies. The signs and symptoms of potential                            delayed complications were discussed with the                            patient. Return to normal activities tomorrow.                            Written discharge instructions were provided to the                            patient.                           - High fiber diet.                           - Continue present medications.                           - No aspirin, ibuprofen, naproxen, or other  non-steroidal anti-inflammatory drugs for 2 days.                           - OK for discharge this afternoon.                           - GI follow prn.                           - Post hospital follow up with PCP. Procedure Code(s):        --- Professional ---                           [phone], Colonoscopy, flexible; with control of                            bleeding, any method Diagnosis Code(s):        --- Professional ---                           K55.20, Angiodysplasia of colon without hemorrhage                           K92.1, Melena (includes Hematochezia)                           K57.30, Diverticulosis of large intestine without                            perforation or abscess without bleeding CPT copyright 2017 [employer]. All rights reserved. The codes documented in this report are preliminary and upon coder review may  be revised to meet current compliance requirements. Ladene Artist, MD 09/02/2017 1:00:28 PM This report has been signed electronically. Number of Addenda: 0

## 2017-09-03 ENCOUNTER — Encounter (HOSPITAL_COMMUNITY): Payer: Self-pay | Admitting: Gastroenterology

## 2017-09-03 ENCOUNTER — Telehealth: Payer: Self-pay

## 2017-09-03 NOTE — Telephone Encounter (Signed)
Spoke with patients daughter, Juliann Pulse.   Transition Care Management Follow-up Telephone Call  Admit date: 08/31/2017 Discharge date: 09/02/2017 Principal Problem:  GI bleed   How have you been since you were released from the hospital? "She's doing good"   Do you understand why you were in the hospital? yes   Do you understand the discharge instructions? yes   Where were you discharged to? Home. Lives with daughter.    Items Reviewed:  Medications reviewed: yes  Allergies reviewed: yes  Dietary changes reviewed: yes  Referrals reviewed: yes   Functional Questionnaire:   Activities of Daily Living (ADLs):   She states they are independent in the following: ambulation, bathing and hygiene, feeding, continence, grooming, toileting and dressing States they require assistance with the following: None.    Any transportation issues/concerns?: no   Any patient concerns? no   Confirmed importance and date/time of follow-up visits scheduled yes  Provider Appointment booked with PCP 09/07/2017  Confirmed with patient if condition begins to worsen call PCP or go to the ER.  Patient was given the office number and encouraged to call back with question or concerns.  : yes

## 2017-09-06 NOTE — Progress Notes (Signed)
Madison White is a 84 y.o. female is here for a [place].  History of Present Illness:   Shaune Pascal CMA acting as scribe for Dr. Juleen China.  HPI: Patient is a 84 year old female with past medical history of hypertension, complete heart block status post permanent pacemaker placement, nonischemic cardiomyopathy presented to the ED with CC of 4 episodes of bright red blood per rectum.Noted to be on ASA only. Patient was admitted for the management of GI bleed, had colonoscopy - found to have a single non-bleeding colonic angiodysplastic lesion. Treated with argon plasma coagulation. Two non-bleeding colonic angiodysplastic lesion were also treated with argon plasma coagulation. PPI was started. Hemoglobin was stable.   Hospital Follow up: Patient is here today with her granddaughter. Feeling better. However, started having bilateral breast pain about a week ago after starting the Protonix.   We will order a mammogram today. If pain does get better in the next couple weeks Dr. Juleen China will change the medication.   Prolonged QTC:She is not on any QT prolonging medication. Will monitor.  History of nonischemic cardiomyopathy/complete heart block: Status post pacemaker placement on 05/05/2017 by Dr. Lovena Le. History of hypertension: Currently blood pressure stable.   Health Maintenance Due  Topic Date Due  . DEXA SCAN  12/11/1995   Depression screen PHQ 2/9 01/14/2017 01/07/2016  Decreased Interest 0 0  Down, Depressed, Hopeless 0 0  PHQ - 2 Score 0 0   PMHx, SurgHx, SocialHx, FamHx, Medications, and Allergies were reviewed in the Visit Navigator and updated as appropriate.   Patient Active Problem List   Diagnosis Date Noted  . Acute lower GI bleeding   . Hematochezia   . GI bleed 08/31/2017  . Complete heart block ([place]) 05/05/2017  . Renal cyst 03/01/2017  . Hydronephrosis with urinary obstruction due to renal calculus 03/01/2017  . Total knee replacement status,  bilateral 11/06/2016  . Nonischemic cardiomyopathy ([place]) 11/17/2014  . Upper airway cough syndrome 02/26/2014  . Constipation, chronic 02/26/2014  . GERD (gastroesophageal reflux disease) 11/19/2012  . Dyslipidemia 09/02/2012  . Left bundle branch block 12/18/2011  . HTN (hypertension), benign 05/31/2011   Social History   Tobacco Use  . Smoking status: Former Smoker    Last attempt to quit: 05/31/1958    Years since quitting: 59.3  . Smokeless tobacco: Never Used  Substance Use Topics  . Alcohol use: Yes    Alcohol/week: 0.6 oz    Types: 1 Glasses of wine per week  . Drug use: No   Current Medications and Allergies:   Current Outpatient Medications:  .  metoprolol tartrate (LOPRESSOR) 25 MG tablet, Take 1 tablet (25 mg total) by mouth 2 (two) times daily. (Patient taking differently: Take 25 mg by mouth daily. ), Disp: 60 tablet, Rfl: 6 .  pantoprazole (PROTONIX) 40 MG tablet, Take 1 tablet (40 mg total) by mouth daily., Disp: 30 tablet, Rfl: 0 .  aspirin 81 MG tablet, Take only after 3 days . (Patient not taking: Reported on 09/07/2017), Disp: 1 tablet, Rfl: 0   Allergies  Allergen Reactions  . Penicillins Hives and Swelling    Has patient had a PCN reaction causing immediate rash, facial/tongue/throat swelling, SOB or lightheadedness with hypotension: Yes Has patient had a PCN reaction causing severe rash involving mucus membranes or skin necrosis: No Has patient had a PCN reaction that required hospitalization: No Has patient had a PCN reaction occurring within the last 10 years: No If all of the above  answers are "NO", then may proceed with Cephalosporin use.   . Nickel Swelling and Rash  . Avapro [Irbesartan] Other (See Comments)    Unknown rxn  . Lisinopril Swelling    Swelling around eyes; also says it caused increased sugar and BP  . Simvastatin Other (See Comments)    unknown  . Latex Rash  . Sulfa Antibiotics Rash  . Sulfamethoxazole Rash   Review of Systems    Pertinent items are noted in the HPI. Otherwise, ROS is negative.  Vitals:   Vitals:   09/07/17 1433  BP: (!) 164/80  Pulse: 68  Temp: 98.1 F (36.7 C)  TempSrc: Oral  SpO2: 97%  Weight: 141 lb 9.6 oz (64.2 kg)  Height: 5\' 1"  (1.549 m)     Body mass index is 26.76 kg/m.  Physical Exam:   Physical Exam  Constitutional: She appears well-nourished.  HENT:  Head: Normocephalic and atraumatic.  Eyes: Pupils are equal, round, and reactive to light. EOM are normal.  Neck: Normal range of motion. Neck supple.  Cardiovascular: Normal rate, regular rhythm, normal heart sounds and intact distal pulses.  Pulmonary/Chest: Effort normal.  Abdominal: Soft.  Skin: Skin is warm.  Psychiatric: She has a normal mood and affect. Her behavior is normal.  Nursing note and vitals reviewed.    Assessment and Plan:   Shanan was seen today for hospitalization follow-up.  Diagnoses and all orders for this visit:  Hospital discharge follow-up Comments: Medication reconciliation:  [x]   Medication list updated [x]   New medication list given to patient/family/caregiver  Referrals: [x]   None needed []   Referrals made to:   Community resources identified for patient/family:  [x]   None needed  []   Home health agency []   Assisted living  []   Hospice  []   Support group  []   Education program  Durable medical equipment ordered:  []   None needed  []   DME ordered:   Additional communication delivered or planned:  [x]   Family/Caregiver:  []   Specialists:  []   Other:  Patient education: Topics discussed: AS ABOVE Handouts given: SEE AVS  Initial transitional care contact was made on 09/03/17 (see separate note).  Acute lower GI bleeding Comments: Resolved. Lab recheck pending.   Anemia, unspecified type -     CBC with Differential/Platelet  Gastroesophageal reflux disease, esophagitis presence not specified -     Comprehensive metabolic panel  Screening for breast  cancer -     MM Digital Screening; Future    . Reviewed expectations re: course of current medical issues. . Discussed self-management of symptoms. . Outlined signs and symptoms indicating need for more acute intervention. . Patient verbalized understanding and all questions were answered. [name] Health Maintenance issues including appropriate healthy diet, exercise, and smoking avoidance were discussed with patient. . See orders for this visit as documented in the electronic medical record. . Patient received an After Visit Summary.  [name], DO [place], [place] 09/13/2017  Future Appointments  Date Time Provider [place]  11/02/2017  8:10 AM [facility] DEVICE REMOTES [facility]  12/08/2017  2:40 PM [name], DO [facility]  01/14/2018 10:00 AM Williemae Area, RN [facility]  01/14/2018 11:00 AM [name], DO [facility]    CMA served as scribe during this visit. History, Physical, and Plan performed by medical provider. The above documentation has been reviewed and is accurate and complete. [name], D.O.

## 2017-09-07 ENCOUNTER — Ambulatory Visit: Payer: Medicare HMO | Admitting: Family Medicine

## 2017-09-07 ENCOUNTER — Inpatient Hospital Stay: Payer: Medicare HMO | Admitting: Physician Assistant

## 2017-09-07 ENCOUNTER — Encounter: Payer: Self-pay | Admitting: Family Medicine

## 2017-09-07 VITALS — BP 164/80 | HR 68 | Temp 98.1°F | Ht 61.0 in | Wt 141.6 lb

## 2017-09-07 DIAGNOSIS — D649 Anemia, unspecified: Secondary | ICD-10-CM

## 2017-09-07 DIAGNOSIS — Z8719 Personal history of other diseases of the digestive system: Secondary | ICD-10-CM

## 2017-09-07 DIAGNOSIS — Z09 Encounter for follow-up examination after completed treatment for conditions other than malignant neoplasm: Secondary | ICD-10-CM

## 2017-09-07 DIAGNOSIS — K219 Gastro-esophageal reflux disease without esophagitis: Secondary | ICD-10-CM | POA: Diagnosis not present

## 2017-09-07 DIAGNOSIS — Z1239 Encounter for other screening for malignant neoplasm of breast: Secondary | ICD-10-CM

## 2017-09-07 DIAGNOSIS — K922 Gastrointestinal hemorrhage, unspecified: Secondary | ICD-10-CM | POA: Diagnosis not present

## 2017-09-07 LAB — CBC WITH DIFFERENTIAL/PLATELET
Basophils Absolute: 0.1 10*3/uL (ref 0.0–0.1)
Basophils Relative: 1.1 % (ref 0.0–3.0)
Eosinophils Absolute: 0.4 10*3/uL (ref 0.0–0.7)
Eosinophils Relative: 4.5 % (ref 0.0–5.0)
HCT: 35.2 % — ABNORMAL LOW (ref 36.0–46.0)
Hemoglobin: 12.1 g/dL (ref 12.0–15.0)
Lymphocytes Relative: 24.8 % (ref 12.0–46.0)
Lymphs Abs: 2.2 10*3/uL (ref 0.7–4.0)
MCHC: 34.3 g/dL (ref 30.0–36.0)
MCV: 86.6 fl (ref 78.0–100.0)
Monocytes Absolute: 0.7 10*3/uL (ref 0.1–1.0)
Monocytes Relative: 7.9 % (ref 3.0–12.0)
Neutro Abs: 5.6 10*3/uL (ref 1.4–7.7)
Neutrophils Relative %: 61.7 % (ref 43.0–77.0)
Platelets: 343 10*3/uL (ref 150.0–400.0)
RBC: 4.07 Mil/uL (ref 3.87–5.11)
RDW: 13.9 % (ref 11.5–15.5)
WBC: 9 10*3/uL (ref 4.0–10.5)

## 2017-09-07 LAB — COMPREHENSIVE METABOLIC PANEL
ALT: 12 U/L (ref 0–35)
AST: 20 U/L (ref 0–37)
Albumin: 3.8 g/dL (ref 3.5–5.2)
Alkaline Phosphatase: 44 U/L (ref 39–117)
BUN: 7 mg/dL (ref 6–23)
CO2: 28 mEq/L (ref 19–32)
Calcium: 9.4 mg/dL (ref 8.4–10.5)
Chloride: 105 mEq/L (ref 96–112)
Creatinine, Ser: 0.72 mg/dL (ref 0.40–1.20)
GFR: 81.49 mL/min (ref >60.00)
Glucose, Bld: 91 mg/dL (ref 70–99)
Potassium: 4.2 mEq/L (ref 3.5–5.1)
Sodium: 141 mEq/L (ref 135–145)
Total Bilirubin: 0.3 mg/dL (ref 0.2–1.2)
Total Protein: 6.8 g/dL (ref 6.0–8.3)

## 2017-09-21 DIAGNOSIS — N200 Calculus of kidney: Secondary | ICD-10-CM | POA: Diagnosis not present

## 2017-09-21 DIAGNOSIS — N281 Cyst of kidney, acquired: Secondary | ICD-10-CM | POA: Diagnosis not present

## 2017-09-28 ENCOUNTER — Encounter: Payer: Self-pay | Admitting: Family Medicine

## 2017-09-30 ENCOUNTER — Other Ambulatory Visit: Payer: Self-pay

## 2017-09-30 MED ORDER — PANTOPRAZOLE SODIUM 40 MG PO TBEC: 40.0000 mg | DELAYED_RELEASE_TABLET | Freq: Every day | ORAL | 3 refills | Status: DC

## 2017-10-13 ENCOUNTER — Encounter: Payer: Self-pay | Admitting: Internal Medicine

## 2017-10-13 MED ORDER — METOPROLOL TARTRATE 25 MG PO TABS: 25.0000 mg | ORAL_TABLET | Freq: Two times a day (BID) | ORAL | 3 refills | Status: DC

## 2017-10-13 NOTE — Telephone Encounter (Signed)
Pt's medication was sent to pt's pharmacy as requested. Confirmation received.  °

## 2017-10-26 ENCOUNTER — Other Ambulatory Visit: Payer: Self-pay | Admitting: Family Medicine

## 2017-11-02 ENCOUNTER — Ambulatory Visit (INDEPENDENT_AMBULATORY_CARE_PROVIDER_SITE_OTHER): Payer: Medicare HMO | Admitting: *Deleted

## 2017-11-02 DIAGNOSIS — I442 Atrioventricular block, complete: Secondary | ICD-10-CM

## 2017-11-02 NOTE — Progress Notes (Signed)
Cardiology Office Note Date:  11/04/2017  Patient ID:  Madison White, Madison White 1929/02/09, MRN [id number] PCP:  [name], DO  Cardiologist:  Dr. Sallyanne Kuster Electrophysiologist: Dr. Lovena Le   Chief Complaint: pain at pacer site  History of Present Illness: Madison White is a 84 y.o. female with history of CHB w/CRT-P, HTN (patient denies this and has declined tx), HLD (patient has denied this and declines tx), NICM, LBBB, OA.  She comes in today to be seen for Dr. Lovena Le, last seen by him in May, at that time, with c/o SOB/DOE, no noted mention of pain at her device at that time.  She had no escape rhythm, and had 3% PVCs.  She was hospitalized in June for LGIB, colonoscopy showed a single non-bleeding colonic angiodysplastic lesion. Treated with argon plasma coagulation .Two non-bleeding colonic angiodysplastic lesion were also treated with argon plasma coagulation, discharged 09/02/17.  The patient is accompanied by her sone.  She actually denies any pain at her device.  She was exercising last week arm exercises, she says she uses 2lb little weights), when she had a sudden pain in her shoulder that radiated down to the side of her arm.  She had no pain at the device but after that noticed that her device seems to have "sunck in" where she used to be able to see the outline or "circle" of the device, it is not prominent like that anymore, and she was worried that she may have done something to damage it.  She also mentions that she notices the veins in her feet are more noticeable as well.  She c/w L shoudler pain and with movement of her arm radiated to her upper arm.  She can lift her arm above her head, and has good ROM, but not without discomfort.  Outside of this she is feeling quite well.  No CP, palpitations, she denies any SOB, able to do her ADL's, exercises as usual (outside of her new shoulder pain).  No near syncope or syncope.  She denies any symptoms of PND or orthopnea.  She denies  any pain in her feet, LE, calves.   Device information SJM CRT-P, implanted 05/05/17  Past Medical History:  Diagnosis Date  . Diastolic dysfunction, left ventricle   . GERD (gastroesophageal reflux disease)   . 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  [name] History of blood transfusion   . History of rheumatic fever   . HTN (hypertension)    hx of refusing treatment--[place] HTN and/or situational HTN (?)   . Hyperlipidemia    hx of refusing treatment  . Insulin resistance    A1c's excellent per old records (6.1 in 2008 and 2009)  . Left bundle branch block 12/2011   Dr. Sallyanne Kuster at [facility]; ECHO and myocardial perfusion scan showed  septal and apical wall motion abnormality but she had no significant valvular disease and no ischemia.  She did have mildly decreased EF (39% on lexiscan and 50% on echo) and abnl LV relaxation.  Mild amount of PVCs.  Pt at higher risk for other conduction abnormalities, good chance of eventually requiring a pacemaker.   [name] LVH (left ventricular hypertrophy)   . Nonischemic cardiomyopathy ([place]) 2016   LV dysfunction due to LBBB/septal  dyssynchrony  . Osteoarthritis    bilat knees--needs bilat TKA.  Ortho is trying steroid injections as of 10/23/15.  . Presence of permanent cardiac pacemaker 05/05/2017  . Seasonal allergies   . Solitary pulmonary nodule 05/2016   8 mm pleural based nodule in RLL.  Radiology recommended f/u noncontrast CT in 6-12 mo.    Past Surgical History:  Procedure Laterality Date  . ABDOMINAL HYSTERECTOMY  1963   At the time of her last C/S; says she had partial bladder resection at that time as well  . BI-VENTRICULAR PACEMAKER INSERTION (CRT-P)  05/05/2017  . BIV PACEMAKER INSERTION CRT-P N/A 05/05/2017    Procedure: BIV PACEMAKER INSERTION CRT-P;  Surgeon: Evans Lance, MD;  Location: [facility];  Service: Cardiovascular;  Laterality: N/A;  . CARDIOVASCULAR STRESS TEST  08/2005 & 12/2011   Low risk scans (on the 12/2011 scan she did have EF 39% with moderately severe LV dysfunction with septal dyssynergy probably contributed by LBBB  . Carotid dopplers  11/22/14; 11/30/16   NORMAL 2016 and 2018  . CATARACT EXTRACTION  08/14/11   both  . CESAREAN SECTION  X 5   One of her neonates died soon after birth  . CHOLECYSTECTOMY     1980's  . COLONOSCOPY WITH PROPOFOL N/A 09/02/2017   Procedure: COLONOSCOPY WITH PROPOFOL;  Surgeon: Ladene Artist, MD;  Location: [facility] ENDOSCOPY;  Service: Endoscopy;  Laterality: N/A;  . HOT HEMOSTASIS N/A 09/02/2017   Procedure: HOT HEMOSTASIS (ARGON PLASMA COAGULATION/BICAP);  Surgeon: Ladene Artist, MD;  Location: [facility] ENDOSCOPY;  Service: Endoscopy;  Laterality: N/A;  . INSERT / REPLACE / REMOVE PACEMAKER    . JOINT REPLACEMENT Bilateral    knee  . right knee surgery Right 05/2016   ? R TKA: no records.  [name] STRABISMUS SURGERY  1939  . TONSILLECTOMY  1938  . TRANSTHORACIC ECHOCARDIOGRAM  01/13/12   Septal dyssynergy, EF 50%, LV relaxation impaired.  No significant valvular abnormalities.    Current Outpatient Medications  Medication Sig Dispense Refill  . aspirin 81 MG tablet Take only after 3 days . 1 tablet 0  . Cholecalciferol (VITAMIN D3) 1000 units CAPS Take by mouth daily.    . metoprolol tartrate (LOPRESSOR) 25 MG tablet Take 1 tablet (25 mg total) by mouth 2 (two) times daily. 180 tablet 3  . pantoprazole (PROTONIX) 40 MG tablet TAKE 1 TABLET BY MOUTH EVERY DAY 30 tablet 2   No current facility-administered medications for this visit.     Allergies:   Penicillins; Nickel; Avapro [irbesartan]; Lisinopril; Simvastatin; Latex; Sulfa antibiotics; and Sulfamethoxazole   Social History:  The patient  reports that she quit smoking about 59 years  ago. She has never used smokeless tobacco. She reports that she drinks about 1.0 standard drinks of alcohol per week. She reports that she does not use drugs.   Family History:  The patient's family history includes Cancer in her father; Diabetes in her mother; Heart disease in her father and mother; Hyperlipidemia in her mother; Hypertension in her mother.  ROS:  Please see the history of present illness.  All other systems are reviewed and otherwise negative.   PHYSICAL EXAM:  VS:  BP 138/76   Pulse 67   Ht 5\' 1"  (1.549 m)   Wt 141 lb (64 kg)   BMI 26.64 kg/m  BMI: Body mass index is 26.64 kg/m. Well nourished, well developed, in no acute distress  HEENT: normocephalic, atraumatic  Neck: no JVD,  carotid bruits or masses Cardiac:  RRR; no significant murmurs, no rubs, or gallops Lungs:  CTA b/l, no wheezing, rhonchi or rales  Abd: soft, nontender MS: no deformity, age appropriate atrophy.  I do not see any obvious deformity of her shoulder/arm, no bruising or skin changes, it is non-tender Ext: no edema b/l.  She has excellent DP/PT pulses b/l, no calf tenderness, no skin changes appreciated.  She has numerous very small and superficial veins appreciated Skin: warm and dry, no rash Neuro:  No gross deficits appreciated Psych: euthymic mood, full affect  PPM site is stable, no tethering, no skin changes or discomfort   EKG:  Not done today PPM interrogation done today and reviewed by myself: battery and lead measurements are all good, she has no R waves at 30bpm.  7.3AP, 95% BiVe pacing.  48AMS episodes, longest is 2 hours, is AFlutter/fib, other EGM available for review is AT/ST, 4 seconds.   01/16/12: Lexiscan stress normal perfusion Moderately severe LV dysfunction, septal dyssynergy, probably contributed by LBBB Low risk  01/13/12: TTE LVEF 50%,mod conc LVH + WMA Mild MR, TR    Recent Labs: 05/05/2017: B Natriuretic Peptide 815.4 09/01/2017: Magnesium 2.0 09/07/2017:  ALT 12; BUN 7; Creatinine, Ser 0.72; Hemoglobin 12.1; Platelets 343.0; Potassium 4.2; Sodium 141  No results found for requested labs within last 8760 hours.   CrCl cannot be calculated (Patient's most recent lab result is older than the maximum 21 days allowed.).   Wt Readings from Last 3 Encounters:  11/04/17 141 lb (64 kg)  09/07/17 141 lb 9.6 oz (64.2 kg)  09/02/17 139 lb (63 kg)     Other studies reviewed: Additional studies/records reviewed today include: summarized above  ASSESSMENT AND PLAN:  1. PPM, CRT-P     Site looks OK, intact function  2. Hx of LV dysfunction, LBBB     Improved LVEF by last echo, 2013  3.  HTN      Looks OK      She is taking the metoprolol  4. New PAFib/flutter     Burden <1%, follow via her device, monitor burden  5. L shoulder pain     Musculoskeletal recommended to f/u with her PMD 6. Concerns of veins in her feet     She tells me she has aloways had some, though seems worse/more prominent in the last week     No pain, no claudication symptoms     Excellent pedal pulses, no edema or exam findings to suggest DVT     appers to have very superficial small veins feet b/l/ equally     Also suggested she follow this up with her PMD.   Disposition: F/u with q 3 month remote checks, Dr. Lovena Le as scheduled sooner if needed.  Her PMD as discussed   Current medicines are reviewed at length with the patient today.  The patient did not have any concerns regarding medicines.  Venetia Night, PA-C 11/04/2017 12:40 PM     [address] [place] [address] [phone] (office)  [phone] (fax)

## 2017-11-03 NOTE — Progress Notes (Signed)
Remote pacemaker transmission.   

## 2017-11-04 ENCOUNTER — Ambulatory Visit
Admission: RE | Admit: 2017-11-04 | Discharge: 2017-11-04 | Disposition: A | Discharge status: Home / Self Care | Payer: Medicare HMO | Source: Ambulatory Visit | Attending: Family Medicine | Admitting: Family Medicine

## 2017-11-04 ENCOUNTER — Ambulatory Visit: Payer: Medicare HMO | Admitting: Physician Assistant

## 2017-11-04 VITALS — BP 138/76 | HR 67 | Ht 61.0 in | Wt 141.0 lb

## 2017-11-04 DIAGNOSIS — Z950 Presence of cardiac pacemaker: Secondary | ICD-10-CM

## 2017-11-04 DIAGNOSIS — I10 Essential (primary) hypertension: Secondary | ICD-10-CM | POA: Diagnosis not present

## 2017-11-04 DIAGNOSIS — Z1239 Encounter for other screening for malignant neoplasm of breast: Secondary | ICD-10-CM

## 2017-11-04 DIAGNOSIS — R52 Pain, unspecified: Secondary | ICD-10-CM

## 2017-11-04 DIAGNOSIS — Z1231 Encounter for screening mammogram for malignant neoplasm of breast: Secondary | ICD-10-CM | POA: Diagnosis not present

## 2017-11-04 NOTE — Patient Instructions (Addendum)
Medication Instructions:   Your physician recommends that you continue on your current medications as directed. Please refer to the Current Medication list given to you today.   If you need a refill on your cardiac medications before your next appointment, please call your pharmacy.  Labwork: NONE ORDERED  TODAY    Testing/Procedures: NONE ORDERED  TODAY    Follow-Up: Remote monitoring is used to monitor your Pacemaker of ICD from home. This monitoring reduces the number of office visits required to check your device to one time per year. It allows [place] to keep an eye on the functioning of your device to ensure it is working properly. You are scheduled for a device check from home on ...11-18-19You may send your transmission at any time that day. If you have a wireless device, the transmission will be sent automatically. After your physician reviews your transmission, you will receive a postcard with your next transmission date.    Any Other Special Instructions Will Be Listed Below (If Applicable).

## 2017-11-21 ENCOUNTER — Emergency Department (HOSPITAL_BASED_OUTPATIENT_CLINIC_OR_DEPARTMENT_OTHER)
Admission: EM | Admit: 2017-11-21 | Discharge: 2017-11-22 | Disposition: A | Discharge status: Home / Self Care | Payer: Medicare HMO | Attending: Emergency Medicine | Admitting: Emergency Medicine

## 2017-11-21 ENCOUNTER — Encounter (HOSPITAL_BASED_OUTPATIENT_CLINIC_OR_DEPARTMENT_OTHER): Payer: Self-pay | Admitting: *Deleted

## 2017-11-21 ENCOUNTER — Emergency Department (HOSPITAL_BASED_OUTPATIENT_CLINIC_OR_DEPARTMENT_OTHER): Payer: Medicare HMO

## 2017-11-21 ENCOUNTER — Other Ambulatory Visit: Payer: Self-pay

## 2017-11-21 DIAGNOSIS — Z96653 Presence of artificial knee joint, bilateral: Secondary | ICD-10-CM | POA: Insufficient documentation

## 2017-11-21 DIAGNOSIS — S0033XA Contusion of nose, initial encounter: Secondary | ICD-10-CM | POA: Insufficient documentation

## 2017-11-21 DIAGNOSIS — Z7982 Long term (current) use of aspirin: Secondary | ICD-10-CM | POA: Diagnosis not present

## 2017-11-21 DIAGNOSIS — Z79899 Other long term (current) drug therapy: Secondary | ICD-10-CM | POA: Diagnosis not present

## 2017-11-21 DIAGNOSIS — I10 Essential (primary) hypertension: Secondary | ICD-10-CM | POA: Diagnosis not present

## 2017-11-21 DIAGNOSIS — Y9301 Activity, walking, marching and hiking: Secondary | ICD-10-CM | POA: Diagnosis not present

## 2017-11-21 DIAGNOSIS — S99921A Unspecified injury of right foot, initial encounter: Secondary | ICD-10-CM | POA: Diagnosis not present

## 2017-11-21 DIAGNOSIS — Z91040 Latex allergy status: Secondary | ICD-10-CM | POA: Diagnosis not present

## 2017-11-21 DIAGNOSIS — S0990XA Unspecified injury of head, initial encounter: Secondary | ICD-10-CM | POA: Diagnosis not present

## 2017-11-21 DIAGNOSIS — M79671 Pain in right foot: Secondary | ICD-10-CM | POA: Diagnosis not present

## 2017-11-21 DIAGNOSIS — M79641 Pain in right hand: Secondary | ICD-10-CM | POA: Diagnosis not present

## 2017-11-21 DIAGNOSIS — S9031XA Contusion of right foot, initial encounter: Secondary | ICD-10-CM | POA: Insufficient documentation

## 2017-11-21 DIAGNOSIS — S0993XA Unspecified injury of face, initial encounter: Secondary | ICD-10-CM | POA: Diagnosis not present

## 2017-11-21 DIAGNOSIS — W0110XA Fall on same level from slipping, tripping and stumbling with subsequent striking against unspecified object, initial encounter: Secondary | ICD-10-CM | POA: Diagnosis not present

## 2017-11-21 DIAGNOSIS — S60221A Contusion of right hand, initial encounter: Secondary | ICD-10-CM | POA: Diagnosis not present

## 2017-11-21 DIAGNOSIS — Y999 Unspecified external cause status: Secondary | ICD-10-CM | POA: Diagnosis not present

## 2017-11-21 DIAGNOSIS — S6991XA Unspecified injury of right wrist, hand and finger(s), initial encounter: Secondary | ICD-10-CM | POA: Diagnosis not present

## 2017-11-21 DIAGNOSIS — Y929 Unspecified place or not applicable: Secondary | ICD-10-CM | POA: Insufficient documentation

## 2017-11-21 DIAGNOSIS — S199XXA Unspecified injury of neck, initial encounter: Secondary | ICD-10-CM | POA: Diagnosis not present

## 2017-11-21 DIAGNOSIS — Z87891 Personal history of nicotine dependence: Secondary | ICD-10-CM | POA: Diagnosis not present

## 2017-11-21 DIAGNOSIS — R9431 Abnormal electrocardiogram [ECG] [EKG]: Secondary | ICD-10-CM | POA: Diagnosis not present

## 2017-11-21 LAB — CBC WITH DIFFERENTIAL/PLATELET
Basophils Absolute: 0 10*3/uL (ref 0.0–0.1)
Basophils Relative: 1 %
EOS PCT: 3 %
Eosinophils Absolute: 0.2 10*3/uL (ref 0.0–0.7)
HEMATOCRIT: 40.2 % (ref 36.0–46.0)
Hemoglobin: 13.4 g/dL (ref 12.0–15.0)
LYMPHS PCT: 29 %
Lymphs Abs: 2.5 10*3/uL (ref 0.7–4.0)
MCH: 28.2 pg (ref 26.0–34.0)
MCHC: 33.3 g/dL (ref 30.0–36.0)
MCV: 84.6 fL (ref 78.0–100.0)
MONO ABS: 0.7 10*3/uL (ref 0.1–1.0)
MONOS PCT: 9 %
NEUTROS ABS: 5.2 10*3/uL (ref 1.7–7.7)
Neutrophils Relative %: 60 %
PLATELETS: 261 10*3/uL (ref 150–400)
RBC: 4.75 MIL/uL (ref 3.87–5.11)
RDW: 14.1 % (ref 11.5–15.5)
WBC: 8.6 10*3/uL (ref 4.0–10.5)

## 2017-11-21 LAB — COMPREHENSIVE METABOLIC PANEL
ALK PHOS: 45 U/L (ref 38–126)
ALT: 13 U/L (ref 0–44)
AST: 23 U/L (ref 15–41)
Albumin: 3.7 g/dL (ref 3.5–5.0)
Anion gap: 9 (ref 5–15)
BILIRUBIN TOTAL: 0.6 mg/dL (ref 0.3–1.2)
BUN: 13 mg/dL (ref 8–23)
CALCIUM: 8.7 mg/dL — AB (ref 8.9–10.3)
CO2: 29 mmol/L (ref 22–32)
Chloride: 103 mmol/L (ref 98–111)
Creatinine, Ser: 0.77 mg/dL (ref 0.44–1.00)
GFR calc Af Amer: >60 mL/min (ref >60)
GFR calc non Af Amer: >60 mL/min (ref >60)
Glucose, Bld: 101 mg/dL — ABNORMAL HIGH (ref 70–99)
Potassium: 3.8 mmol/L (ref 3.5–5.1)
Sodium: 141 mmol/L (ref 135–145)
TOTAL PROTEIN: 7 g/dL (ref 6.5–8.1)

## 2017-11-21 LAB — TROPONIN I
Troponin I: 0.03 ng/mL (ref <0.03)
Troponin I: 0.03 ng/mL (ref <0.03)

## 2017-11-21 MED ORDER — ACETAMINOPHEN 325 MG PO TABS
650.0000 mg | ORAL_TABLET | Freq: Once | ORAL | Status: AC
Start: 2017-11-21 — End: 2017-11-21
  Administered 2017-11-21: 650 mg via ORAL
  Filled 2017-11-21: qty 2

## 2017-11-21 NOTE — ED Notes (Signed)
Pt [employer] pacemaker was interrogated and results relayed to provider

## 2017-11-21 NOTE — ED Notes (Signed)
Dr. Darl Householder aware that troponin is 0.03.

## 2017-11-21 NOTE — ED Provider Notes (Signed)
[facility] Provider Note   CSN: [id number] Arrival date & time: 11/21/17  1952     History   Chief Complaint Chief Complaint  Patient presents with  . Fall    HPI Madison White is a 84 y.o. female history of hypertension, non ischemic cardiomyopathy with pacemaker here presenting with fall.  Patient states that he was walking to the mailbox.  She states that she may have tripped over something and then landed on her face.  She had a nosebleed that stopped spontaneously.  She did not think she passed out but could not tell me exactly what happened.  Denies any chest pain or shortness of breath prior to the event.  Patient noticed progressive pain and swelling of the nose as well as right hand and right foot pain so came to the ED for evaluation.   The history is provided by the patient.    Past Medical History:  Diagnosis Date  . Diastolic dysfunction, left ventricle   . GERD (gastroesophageal reflux disease)   . 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  [name] History of blood transfusion   . History of rheumatic fever   . HTN (hypertension)    hx of refusing treatment--White coat HTN and/or situational HTN (?)   . Hyperlipidemia    hx of refusing treatment  . Insulin resistance    A1c's excellent per old records (6.1 in 2008 and 2009)  . Left bundle branch block 12/2011   Dr. Sallyanne Kuster at [facility]; ECHO and myocardial perfusion scan showed  septal and apical wall motion abnormality but she had no significant valvular disease and no ischemia.  She did have mildly decreased EF (39% on lexiscan and 50% on echo) and abnl LV relaxation.  Mild amount of PVCs.  Pt at higher risk for other conduction abnormalities, good chance of  eventually requiring a pacemaker.   [name] LVH (left ventricular hypertrophy)   . Nonischemic cardiomyopathy ([place]) 2016   LV dysfunction due to LBBB/septal dyssynchrony  . Osteoarthritis    bilat knees--needs bilat TKA.  Ortho is trying steroid injections as of 10/23/15.  . Presence of permanent cardiac pacemaker 05/05/2017  . Seasonal allergies   . Solitary pulmonary nodule 05/2016   8 mm pleural based nodule in RLL.  Radiology recommended f/u noncontrast CT in 6-12 mo.    Patient Active Problem List   Diagnosis Date Noted  . Acute lower GI bleeding   . Hematochezia   . GI bleed 08/31/2017  . Complete heart block ([place]) 05/05/2017  . Renal cyst 03/01/2017  . Hydronephrosis with urinary obstruction due to renal calculus 03/01/2017  . Total knee replacement status, bilateral 11/06/2016  . Nonischemic cardiomyopathy ([place]) 11/17/2014  . Upper airway cough syndrome 02/26/2014  . Constipation, chronic 02/26/2014  . GERD (gastroesophageal reflux disease) 11/19/2012  . Dyslipidemia 09/02/2012  . Left bundle branch block 12/18/2011  . HTN (hypertension), benign 05/31/2011    Past Surgical History:  Procedure Laterality Date  . ABDOMINAL HYSTERECTOMY  1963   At the time of her last C/S; says she had partial bladder resection at that time as well  . BI-VENTRICULAR PACEMAKER INSERTION (CRT-P)  05/05/2017  . BIV PACEMAKER INSERTION CRT-P N/A 05/05/2017   Procedure: BIV PACEMAKER  INSERTION CRT-P;  Surgeon: Evans Lance, MD;  Location: [facility];  Service: Cardiovascular;  Laterality: N/A;  . CARDIOVASCULAR STRESS TEST  08/2005 & 12/2011   Low risk scans (on the 12/2011 scan she did have EF 39% with moderately severe LV dysfunction with septal dyssynergy probably contributed by LBBB  . Carotid dopplers  11/22/14; 11/30/16   NORMAL 2016 and 2018  . CATARACT EXTRACTION  08/14/11   both  . CESAREAN SECTION  X 5   One of her neonates died soon after birth  . CHOLECYSTECTOMY     1980's  .  COLONOSCOPY WITH PROPOFOL N/A 09/02/2017   Procedure: COLONOSCOPY WITH PROPOFOL;  Surgeon: Ladene Artist, MD;  Location: [facility] ENDOSCOPY;  Service: Endoscopy;  Laterality: N/A;  . HOT HEMOSTASIS N/A 09/02/2017   Procedure: HOT HEMOSTASIS (ARGON PLASMA COAGULATION/BICAP);  Surgeon: Ladene Artist, MD;  Location[facility];  Service: Endoscopy;  Laterality: N/A;  . INSERT / REPLACE / REMOVE PACEMAKER    . JOINT REPLACEMENT Bilateral    knee  . right knee surgery Right 05/2016   ? R TKA: no records.  [name] STRABISMUS SURGERY  1939  . TONSILLECTOMY  1938  . TRANSTHORACIC ECHOCARDIOGRAM  01/13/12   Septal dyssynergy, EF 50%, LV relaxation impaired.  No significant valvular abnormalities.     OB History   None      Home Medications    Prior to Admission medications   Medication Sig Start Date End Date Taking? Authorizing Provider  aspirin 81 MG tablet Take only after 3 days . 09/02/17   Shelly Coss, MD  Cholecalciferol (VITAMIN D3) 1000 units CAPS Take by mouth daily.    [provider]  metoprolol tartrate (LOPRESSOR) 25 MG tablet Take 1 tablet (25 mg total) by mouth 2 (two) times daily. 10/13/17   Evans Lance, MD  pantoprazole (Williams) 40 MG tablet TAKE 1 TABLET BY MOUTH EVERY DAY 10/26/17   [name], DO    Family History Family History  Problem Relation Age of Onset  . Hypertension Mother   . Heart disease Mother   . Hyperlipidemia Mother   . Diabetes Mother   . Heart disease Father   . Cancer Father     Social History Social History   Tobacco Use  . Smoking status: Former Smoker    Last attempt to quit: 05/31/1958    Years since quitting: 59.5  . Smokeless tobacco: Never Used  Substance Use Topics  . Alcohol use: Yes    Alcohol/week: 1.0 standard drinks    Types: 1 Glasses of wine per week  . Drug use: No     Allergies   Penicillins; Nickel; Avapro [irbesartan]; Lisinopril; Simvastatin; Latex; Sulfa antibiotics; and  Sulfamethoxazole   Review of Systems Review of Systems  HENT:       Epistaxis   Musculoskeletal:       R hand and foot pain   All other systems reviewed and are negative.    Physical Exam Updated Vital Signs BP (!) 168/71   Pulse 60   Temp 98.2 F (36.8 C)   Resp 13   Ht 5' (1.524 m)   Wt 64.9 kg   SpO2 98%   BMI 27.93 kg/m   Physical Exam  Constitutional: She is oriented to person, place, and time.  Chronically ill   HENT:  Head: Normocephalic.  + swelling and ecchymosis of the bridge of nose. Dry blood in R nostril. No blood in posterior  pharynx   Eyes: Pupils are equal, round, and reactive to light. Conjunctivae and EOM are normal.  Neck: Normal range of motion. Neck supple.  No obvious midline tenderness   Cardiovascular: Normal rate, regular rhythm and normal heart sounds.  Pulmonary/Chest: Effort normal and breath sounds normal. No stridor. No respiratory distress. She has no wheezes.  Abdominal: Soft. Bowel sounds are normal. She exhibits no distension. There is no tenderness. There is no guarding.  Musculoskeletal:  Bruising R hand thenar eminence but no obvious deformity. No midline spinal tenderness, nl ROM bilateral hips. Abrasion R 2nd toe with some tenderness. No ankle tenderness. Neurovascular intact in all extremities   Neurological: She is alert and oriented to person, place, and time.  Skin: Skin is warm. Capillary refill takes less than 2 seconds.  Psychiatric: She has a normal mood and affect.  Nursing note and vitals reviewed.    ED Treatments / Results  Labs (all labs ordered are listed, but only abnormal results are displayed) Labs Reviewed  COMPREHENSIVE METABOLIC PANEL - Abnormal; Notable for the following components:      Result Value   Glucose, Bld 101 (*)    Calcium 8.7 (*)    All other components within normal limits  TROPONIN I - Abnormal; Notable for the following components:   Troponin I 0.03 (*)    All other components within  normal limits  TROPONIN I - Abnormal; Notable for the following components:   Troponin I 0.03 (*)    All other components within normal limits  CBC WITH DIFFERENTIAL/PLATELET    EKG EKG Interpretation  Date/Time:  Saturday November 21 2017 21:35:26 EDT Ventricular Rate:  68 PR Interval:    QRS Duration: 142 QT Interval:  495 QTC Calculation: 667 R Axis:   120 Text Interpretation:  Atrial-sensed ventricular-paced complexes No further analysis attempted due to paced rhythm Baseline wander in lead(s) I III aVR aVL No significant change since last tracing Confirmed by Wandra Arthurs [phone]) on 11/21/2017 9:50:08 PM   Radiology Ct Head Wo Contrast  Result Date: 11/21/2017 CLINICAL DATA:  [place] today and hit the bridge of her nose. EXAM: CT HEAD WITHOUT CONTRAST CT MAXILLOFACIAL WITHOUT CONTRAST CT CERVICAL SPINE WITHOUT CONTRAST TECHNIQUE: Multidetector CT imaging of the head, cervical spine, and maxillofacial structures were performed using the standard protocol without intravenous contrast. Multiplanar CT image reconstructions of the cervical spine and maxillofacial structures were also generated. COMPARISON:  Brain MR dated 12/02/2014. FINDINGS: CT HEAD FINDINGS Brain: Diffusely enlarged ventricles and subarachnoid spaces. Patchy white matter low density in both cerebral hemispheres. Stable old right thalamic lacunar infarct. No intracranial hemorrhage, mass lesion or CT evidence of acute infarction. Vascular: No hyperdense vessel or unexpected calcification. Skull: Bilateral hyperostosis frontalis. No fractures. Other: None. CT MAXILLOFACIAL FINDINGS Osseous: No fracture or mandibular dislocation. No destructive process. Orbits: Status post bilateral cataract extraction. Sinuses: Normally aerated. Soft tissues: Unremarkable. CT CERVICAL SPINE FINDINGS Alignment: Normal. Skull base and vertebrae: No acute fracture. No primary bone lesion or focal pathologic process. Soft tissues and spinal canal: No  prevertebral fluid or swelling. No visible canal hematoma. Disc levels:  Multilevel degenerative changes. Upper chest: Clear lung apices. Other: Mildly heterogeneous right lobe of the thyroid gland without a discrete nodule visualized. Small left lobe. Left subclavian pacemaker leads. IMPRESSION: 1. No skull fracture or intracranial hemorrhage. 2. No maxillofacial fracture. 3. No cervical spine fracture or subluxation. 4. Diffuse cerebral and cerebellar atrophy, chronic small vessel white matter ischemic changes and  old right thalamic lacunar infarct. 5. Cervical spine degenerative changes. Electronically Signed   By: Claudie Revering M.D.   On: 11/21/2017 22:49   Ct Cervical Spine Wo Contrast  Result Date: 11/21/2017 CLINICAL DATA:  [place] today and hit the bridge of her nose. EXAM: CT HEAD WITHOUT CONTRAST CT MAXILLOFACIAL WITHOUT CONTRAST CT CERVICAL SPINE WITHOUT CONTRAST TECHNIQUE: Multidetector CT imaging of the head, cervical spine, and maxillofacial structures were performed using the standard protocol without intravenous contrast. Multiplanar CT image reconstructions of the cervical spine and maxillofacial structures were also generated. COMPARISON:  Brain MR dated 12/02/2014. FINDINGS: CT HEAD FINDINGS Brain: Diffusely enlarged ventricles and subarachnoid spaces. Patchy white matter low density in both cerebral hemispheres. Stable old right thalamic lacunar infarct. No intracranial hemorrhage, mass lesion or CT evidence of acute infarction. Vascular: No hyperdense vessel or unexpected calcification. Skull: Bilateral hyperostosis frontalis. No fractures. Other: None. CT MAXILLOFACIAL FINDINGS Osseous: No fracture or mandibular dislocation. No destructive process. Orbits: Status post bilateral cataract extraction. Sinuses: Normally aerated. Soft tissues: Unremarkable. CT CERVICAL SPINE FINDINGS Alignment: Normal. Skull base and vertebrae: No acute fracture. No primary bone lesion or focal pathologic process.  Soft tissues and spinal canal: No prevertebral fluid or swelling. No visible canal hematoma. Disc levels:  Multilevel degenerative changes. Upper chest: Clear lung apices. Other: Mildly heterogeneous right lobe of the thyroid gland without a discrete nodule visualized. Small left lobe. Left subclavian pacemaker leads. IMPRESSION: 1. No skull fracture or intracranial hemorrhage. 2. No maxillofacial fracture. 3. No cervical spine fracture or subluxation. 4. Diffuse cerebral and cerebellar atrophy, chronic small vessel white matter ischemic changes and old right thalamic lacunar infarct. 5. Cervical spine degenerative changes. Electronically Signed   By: Claudie Revering M.D.   On: 11/21/2017 22:49   Dg Hand Complete Right  Result Date: 11/21/2017 CLINICAL DATA:  Right hand pain following a fall today. EXAM: RIGHT HAND - COMPLETE 3+ VIEW COMPARISON:  None. FINDINGS: Multi joint degenerative changes. No fracture or dislocation seen. IMPRESSION: No fracture. Degenerative changes. Electronically Signed   By: Claudie Revering M.D.   On: 11/21/2017 22:38   Dg Foot Complete Right  Result Date: 11/21/2017 CLINICAL DATA:  Right foot pain following a fall today. EXAM: RIGHT FOOT COMPLETE - 3+ VIEW COMPARISON:  None. FINDINGS: Mild 1st MTP joint degenerative spur formation. No fracture or dislocation seen. IMPRESSION: No fracture. Mild 1st MTP joint degenerative changes. Electronically Signed   By: Claudie Revering M.D.   On: 11/21/2017 22:50   Ct Maxillofacial Wo Contrast  Result Date: 11/21/2017 CLINICAL DATA:  [place] today and hit the bridge of her nose. EXAM: CT HEAD WITHOUT CONTRAST CT MAXILLOFACIAL WITHOUT CONTRAST CT CERVICAL SPINE WITHOUT CONTRAST TECHNIQUE: Multidetector CT imaging of the head, cervical spine, and maxillofacial structures were performed using the standard protocol without intravenous contrast. Multiplanar CT image reconstructions of the cervical spine and maxillofacial structures were also generated.  COMPARISON:  Brain MR dated 12/02/2014. FINDINGS: CT HEAD FINDINGS Brain: Diffusely enlarged ventricles and subarachnoid spaces. Patchy white matter low density in both cerebral hemispheres. Stable old right thalamic lacunar infarct. No intracranial hemorrhage, mass lesion or CT evidence of acute infarction. Vascular: No hyperdense vessel or unexpected calcification. Skull: Bilateral hyperostosis frontalis. No fractures. Other: None. CT MAXILLOFACIAL FINDINGS Osseous: No fracture or mandibular dislocation. No destructive process. Orbits: Status post bilateral cataract extraction. Sinuses: Normally aerated. Soft tissues: Unremarkable. CT CERVICAL SPINE FINDINGS Alignment: Normal. Skull base and vertebrae: No acute fracture. No primary bone lesion or  focal pathologic process. Soft tissues and spinal canal: No prevertebral fluid or swelling. No visible canal hematoma. Disc levels:  Multilevel degenerative changes. Upper chest: Clear lung apices. Other: Mildly heterogeneous right lobe of the thyroid gland without a discrete nodule visualized. Small left lobe. Left subclavian pacemaker leads. IMPRESSION: 1. No skull fracture or intracranial hemorrhage. 2. No maxillofacial fracture. 3. No cervical spine fracture or subluxation. 4. Diffuse cerebral and cerebellar atrophy, chronic small vessel white matter ischemic changes and old right thalamic lacunar infarct. 5. Cervical spine degenerative changes. Electronically Signed   By: Claudie Revering M.D.   On: 11/21/2017 22:49    Procedures Procedures (including critical care time)  Medications Ordered in ED Medications  acetaminophen (TYLENOL) tablet 650 mg (650 mg Oral Given 11/21/17 2116)     Initial Impression / Assessment and Plan / ED Course  I have reviewed the triage vital signs and the nursing notes.  Pertinent labs & imaging results that were available during my care of the patient were reviewed by me and considered in my medical decision making (see chart  for details).     ERIANA SULIMAN is a 84 y.o. female here with fall. She thinks likely had mechanical fall but wasn't clear of the details. Will get labs, EKG. Will get CT head/neck/face and xrays.   11:58 PM Patient's labs showed Trop 0.03, delta is stable. She had no chest pain prior to the fall or currently. [employer] pacemaker interrogated and had no events. Labs and xrays and CTs showed no fractures.   Final Clinical Impressions(s) / ED Diagnoses   Final diagnoses:  Contusion of nose, initial encounter  Contusion of right hand, initial encounter  Contusion of right foot, initial encounter    ED Discharge Orders    None       Drenda Freeze, MD 11/21/17 2358

## 2017-11-21 NOTE — ED Triage Notes (Addendum)
Pt reports she fell around 430 this afternoon while going to get the mail. Pt states she is not sure if she tripped "I just went down". Denies LOC. Hit right hand, right toes, and bridge of nose. Family member states they noticed increased swelling to bridge of nose at dinner this evening so brought her in to be checked. Pt alert and oriented x 4

## 2017-11-21 NOTE — Discharge Instructions (Signed)
Take tylenol for pain.   Expect your nose to be black and blue in the next few days.   Follow up with your doctor  Return to ER if you have headaches, vomiting, passing out.

## 2017-11-21 NOTE — ED Notes (Signed)
Ice pack given

## 2017-11-22 NOTE — ED Notes (Signed)
Pt understood dc material. NAD noted. 

## 2017-12-07 LAB — CUP PACEART REMOTE DEVICE CHECK
Brady Statistic AP VP Percent: 11 %
Brady Statistic AS VP Percent: 84 %
Brady Statistic AS VS Percent: 2.3 %
Brady Statistic RA Percent Paced: 7.3 %
Date Time Interrogation Session: 20190819060013
Implantable Lead Implant Date: 20190219
Implantable Lead Implant Date: 20190219
Implantable Lead Location: 753859
Implantable Pulse Generator Implant Date: 20190219
Lead Channel Impedance Value: 610 Ohm
Lead Channel Pacing Threshold Amplitude: 0.5 V
Lead Channel Pacing Threshold Amplitude: 1.5 V
Lead Channel Pacing Threshold Pulse Width: 0.8 ms
Lead Channel Setting Pacing Amplitude: 2 V
Lead Channel Setting Sensing Sensitivity: 4 mV
MDC IDC LEAD IMPLANT DT: 20190219
MDC IDC LEAD LOCATION: 753858
MDC IDC LEAD LOCATION: 753860
MDC IDC MSMT BATTERY REMAINING LONGEVITY: 88 mo
MDC IDC MSMT BATTERY REMAINING PERCENTAGE: 95.5 %
MDC IDC MSMT BATTERY VOLTAGE: 3.01 V
MDC IDC MSMT LEADCHNL LV IMPEDANCE VALUE: 940 Ohm
MDC IDC MSMT LEADCHNL RA IMPEDANCE VALUE: 490 Ohm
MDC IDC MSMT LEADCHNL RA PACING THRESHOLD AMPLITUDE: 0.75 V
MDC IDC MSMT LEADCHNL RA PACING THRESHOLD PULSEWIDTH: 0.5 ms
MDC IDC MSMT LEADCHNL RA SENSING INTR AMPL: 2.4 mV
MDC IDC MSMT LEADCHNL RV PACING THRESHOLD PULSEWIDTH: 0.5 ms
MDC IDC MSMT LEADCHNL RV SENSING INTR AMPL: 12 mV
MDC IDC PG SERIAL: 8995296
MDC IDC SET LEADCHNL LV PACING AMPLITUDE: 2.5 V
MDC IDC SET LEADCHNL LV PACING PULSEWIDTH: 0.8 ms
MDC IDC SET LEADCHNL RV PACING AMPLITUDE: 2.5 V
MDC IDC SET LEADCHNL RV PACING PULSEWIDTH: 0.5 ms
MDC IDC STAT BRADY AP VS PERCENT: 1 %
Pulse Gen Model: 3262

## 2017-12-08 ENCOUNTER — Encounter: Payer: Self-pay | Admitting: Family Medicine

## 2017-12-08 ENCOUNTER — Ambulatory Visit (INDEPENDENT_AMBULATORY_CARE_PROVIDER_SITE_OTHER): Payer: Medicare HMO | Admitting: Family Medicine

## 2017-12-08 VITALS — BP 136/72 | HR 61 | Temp 98.4°F | Ht 61.0 in | Wt 144.8 lb

## 2017-12-08 DIAGNOSIS — M791 Myalgia, unspecified site: Secondary | ICD-10-CM | POA: Diagnosis not present

## 2017-12-08 DIAGNOSIS — Z23 Encounter for immunization: Secondary | ICD-10-CM

## 2017-12-08 DIAGNOSIS — M25512 Pain in left shoulder: Secondary | ICD-10-CM

## 2017-12-08 DIAGNOSIS — T466X5A Adverse effect of antihyperlipidemic and antiarteriosclerotic drugs, initial encounter: Secondary | ICD-10-CM

## 2017-12-08 DIAGNOSIS — R413 Other amnesia: Secondary | ICD-10-CM | POA: Diagnosis not present

## 2017-12-08 NOTE — Progress Notes (Signed)
Madison White is a 84 y.o. female is here for follow up.  History of Present Illness:   [name], CMA acting as scribe for Dr. [name].   HPI: Patient in office for follow up. She had on 11/21/17 and was seen in ED. She was walking to the mail box and fell on the grass. She is doing much better no issues with that now.   Left arm pain started a few weeks ago after she went to the gym and worked with some weights. She has been evaluated by cardiology and told it was not her pace maker. The pain is a ache. It is painful to pick things up like the milk using that arm. The pain does wake her up if she rolls on that side.   Health Maintenance Due  Topic Date Due  . DEXA SCAN  12/11/1995   Depression screen [facility] 2/9 12/08/2017 01/14/2017 01/07/2016  Decreased Interest 0 0 0  Down, Depressed, Hopeless 0 0 0  PHQ - 2 Score 0 0 0  Altered sleeping 0 - -  Tired, decreased energy 0 - -  Change in appetite 0 - -  Feeling bad or failure about yourself  0 - -  Trouble concentrating 0 - -  Moving slowly or fidgety/restless 0 - -  Suicidal thoughts 0 - -  PHQ-9 Score 0 - -  Difficult doing work/chores Not difficult at all - -   PMHx, SurgHx, SocialHx, FamHx, Medications, and Allergies were reviewed in the Visit Navigator and updated as appropriate.   Patient Active Problem List   Diagnosis Date Noted  . Acute lower GI bleeding   . Hematochezia   . GI bleed 08/31/2017  . Complete heart block ([place]) 05/05/2017  . Renal cyst 03/01/2017  . Hydronephrosis with urinary obstruction due to renal calculus 03/01/2017  . Total knee replacement status, bilateral 11/06/2016  . Nonischemic cardiomyopathy ([place]) 11/17/2014  . Upper airway cough syndrome 02/26/2014  . Constipation, chronic 02/26/2014  . GERD (gastroesophageal reflux disease) 11/19/2012  . Dyslipidemia 09/02/2012  . Left bundle branch block 12/18/2011  . HTN (hypertension), benign 05/31/2011   Social History   Tobacco  Use  . Smoking status: Former Smoker    Last attempt to quit: 05/31/1958    Years since quitting: 59.5  . Smokeless tobacco: Never Used  Substance Use Topics  . Alcohol use: Yes    Alcohol/week: 1.0 standard drinks    Types: 1 Glasses of wine per week  . Drug use: No   Current Medications and Allergies:   .  aspirin 81 MG tablet, Take only after 3 days ., Disp: 1 tablet, Rfl: 0 .  Cholecalciferol (VITAMIN D3) 1000 units CAPS, Take by mouth daily., Disp: , Rfl:  .  metoprolol tartrate (LOPRESSOR) 25 MG tablet, Take 1 tablet (25 mg total) by mouth 2 (two) times daily., Disp: 180 tablet, Rfl: 3 .  pantoprazole (PROTONIX) 40 MG tablet, TAKE 1 TABLET BY MOUTH EVERY DAY, Disp: 30 tablet, Rfl: 2   Allergies  Allergen Reactions  . Penicillins Hives and Swelling    Has patient had a PCN reaction causing immediate rash, facial/tongue/throat swelling, SOB or lightheadedness with hypotension: Yes Has patient had a PCN reaction causing severe rash involving mucus membranes or skin necrosis: No Has patient had a PCN reaction that required hospitalization: No Has patient had a PCN reaction occurring within the last 10 years: No If all of the above answers are "NO", then may  proceed with Cephalosporin use.   . Nickel Swelling and Rash  . Avapro [Irbesartan] Other (See Comments)    Unknown rxn  . Lisinopril Swelling    Swelling around eyes; also says it caused increased sugar and BP  . Simvastatin Other (See Comments)    unknown  . Latex Rash  . Sulfa Antibiotics Rash  . Sulfamethoxazole Rash   Review of Systems   Pertinent items are noted in the HPI. Otherwise, ROS is negative.  Vitals:   Vitals:   12/08/17 1443  BP: 136/72  Pulse: 61  Temp: 98.4 F (36.9 C)  TempSrc: Oral  SpO2: 94%  Weight: 144 lb 12.8 oz (65.7 kg)  Height: 5\' 1"  (1.549 m)     Body mass index is 27.36 kg/m.  Physical Exam:   Physical Exam  Constitutional: She appears well-nourished.  HENT:  Head:  Normocephalic and atraumatic.  Eyes: Pupils are equal, round, and reactive to light. EOM are normal.  Neck: Normal range of motion. Neck supple.  Cardiovascular: Normal rate, regular rhythm, normal heart sounds and intact distal pulses.  Pulmonary/Chest: Effort normal.  Abdominal: Soft.  Skin: Skin is warm.  Psychiatric: She has a normal mood and affect. Her behavior is normal.  Nursing note and vitals reviewed.  Results for orders placed or performed during the hospital encounter of 11/21/17  CBC with Differential/Platelet  Result Value Ref Range   WBC 8.6 4.0 - 10.5 K/uL   RBC 4.75 3.87 - 5.11 MIL/uL   Hemoglobin 13.4 12.0 - 15.0 g/dL   HCT 40.2 36.0 - 46.0 %   MCV 84.6 78.0 - 100.0 fL   MCH 28.2 26.0 - 34.0 pg   MCHC 33.3 30.0 - 36.0 g/dL   RDW 14.1 11.5 - 15.5 %   Platelets 261 150 - 400 K/uL   Neutrophils Relative % 60 %   Neutro Abs 5.2 1.7 - 7.7 K/uL   Lymphocytes Relative 29 %   Lymphs Abs 2.5 0.7 - 4.0 K/uL   Monocytes Relative 9 %   Monocytes Absolute 0.7 0.1 - 1.0 K/uL   Eosinophils Relative 3 %   Eosinophils Absolute 0.2 0.0 - 0.7 K/uL   Basophils Relative 1 %   Basophils Absolute 0.0 0.0 - 0.1 K/uL  Comprehensive metabolic panel  Result Value Ref Range   Sodium 141 135 - 145 mmol/L   Potassium 3.8 3.5 - 5.1 mmol/L   Chloride 103 98 - 111 mmol/L   CO2 29 22 - 32 mmol/L   Glucose, Bld 101 (H) 70 - 99 mg/dL   BUN 13 8 - 23 mg/dL   Creatinine, Ser 0.77 0.44 - 1.00 mg/dL   Calcium 8.7 (L) 8.9 - 10.3 mg/dL   Total Protein 7.0 6.5 - 8.1 g/dL   Albumin 3.7 3.5 - 5.0 g/dL   AST 23 15 - 41 U/L   ALT 13 0 - 44 U/L   Alkaline Phosphatase 45 38 - 126 U/L   Total Bilirubin 0.6 0.3 - 1.2 mg/dL   GFR calc non Af Amer >60 >60 mL/min   GFR calc Af Amer >60 >60 mL/min   Anion gap 9 5 - 15  Troponin I  Result Value Ref Range   Troponin I 0.03 (HH) <0.03 ng/mL  Troponin I  Result Value Ref Range   Troponin I 0.03 (HH) <0.03 ng/mL    Assessment and Plan:   [place]  was seen today for follow-up.  Diagnoses and all orders for this visit:  Acute  pain of left shoulder Comments: HH PT. Orders: -     Ambulatory referral to [place] for immunization -     Flu vaccine HIGH DOSE PF  Myalgia due to statin  Memory deficit Comments: Will monitor. Granddaughter is a great caregiver.    . Reviewed expectations re: course of current medical issues. . Discussed self-management of symptoms. . Outlined signs and symptoms indicating need for more acute intervention. . Patient verbalized understanding and all questions were answered. [name] Health Maintenance issues including appropriate healthy diet, exercise, and smoking avoidance were discussed with patient. . See orders for this visit as documented in the electronic medical record. . Patient received an After Visit Summary.  CMA served as Education administrator during this visit. History, Physical, and Plan performed by medical provider. The above documentation has been reviewed and is accurate and complete. [name], D.O.  [name], DO [place] 12/13/2017

## 2017-12-09 ENCOUNTER — Encounter: Payer: Self-pay | Admitting: Family Medicine

## 2017-12-17 ENCOUNTER — Ambulatory Visit: Payer: Medicare HMO | Admitting: Cardiovascular Disease

## 2017-12-17 ENCOUNTER — Encounter: Payer: Self-pay | Admitting: Cardiovascular Disease

## 2017-12-17 VITALS — BP 136/64 | HR 62 | Ht 61.0 in | Wt 145.0 lb

## 2017-12-17 DIAGNOSIS — E7800 Pure hypercholesterolemia, unspecified: Secondary | ICD-10-CM

## 2017-12-17 DIAGNOSIS — I428 Other cardiomyopathies: Secondary | ICD-10-CM | POA: Diagnosis not present

## 2017-12-17 DIAGNOSIS — I480 Paroxysmal atrial fibrillation: Secondary | ICD-10-CM | POA: Diagnosis not present

## 2017-12-17 DIAGNOSIS — I10 Essential (primary) hypertension: Secondary | ICD-10-CM | POA: Diagnosis not present

## 2017-12-17 DIAGNOSIS — I442 Atrioventricular block, complete: Secondary | ICD-10-CM

## 2017-12-17 DIAGNOSIS — Z950 Presence of cardiac pacemaker: Secondary | ICD-10-CM

## 2017-12-17 MED ORDER — APIXABAN 5 MG PO TABS: 5.0000 mg | ORAL_TABLET | Freq: Two times a day (BID) | ORAL | 5 refills | Status: DC

## 2017-12-17 NOTE — Progress Notes (Signed)
Cardiology Office Note    Date:  12/19/2017   ID:  AARON BOEH, DOB [birth year], MRN [id number]  PCP:  [name], DO  Cardiologist:   Sanda Klein, MD   Chief Complaint  Patient presents with  . Irregular Heart Beat    History of Present Illness:  Madison White is a 84 y.o. female with mild nonischemic cardiomyopathy related to left bundle branch block, hypertension (possibly "white-coat") and mild hyperlipidemia returning after implantation of a CRT-P device in February 2019 for complete heart block ([place]).  Pacemaker interrogation shows almost 3-hour episode of atrial fibrillation that occurred in August this is asymptomatic.  She had another episode of atrial fibrillation in September also lasting approximately 2 hours.  She does not have a history of stroke or TIA.  She does have a history of GI bleeding, which she describes as mild hematochezia.  She underwent colonoscopy June 2019.  This study showed diverticulosis and she had few angiodysplastic lesions in the cecum, 1 of them measuring 8 mm, treated w argon laser.  The lowest hemoglobin around that time was 11.0 with normocytic indices.  She has not had any hematochezia since then.  Previously evaluated left ventricular ejection fraction is approximately 50% (by echo 2013, nuclear scintigraphy showed EF of 39%). She did not have evidence of perfusion abnormalities on nuclear imaging and does not have angina pectoris. Carotid duplex performed September 2016 did not show significant plaque.   The patient specifically denies any chest pain at rest exertion, dyspnea at rest or with exertion, orthopnea, paroxysmal nocturnal dyspnea, syncope, palpitations, focal neurological deficits, intermittent claudication, lower extremity edema, unexplained weight gain, cough, hemoptysis or wheezing.  She rarely has falls.  Once in the last 12 months.   Past Medical History:  Diagnosis Date  . Diastolic  dysfunction, left ventricle   . GERD (gastroesophageal reflux disease)   . 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  [name] History of blood transfusion   . History of rheumatic fever   . HTN (hypertension)    hx of refusing treatment--White coat HTN and/or situational HTN (?)   . Hyperlipidemia    hx of refusing treatment  . Insulin resistance    A1c's excellent per old records (6.1 in 2008 and 2009)  . Left bundle branch block 12/2011   Dr. Sallyanne K[place] at [facility]; ECHO and myocardial perfusion scan showed  septal and apical wall motion abnormality but she had no significant valvular disease and no ischemia.  She did have mildly decreased EF (39% on lexiscan and 50% on echo) and abnl LV relaxation.  Mild amount of PVCs.  Pt at higher risk for other conduction abnormalities, good chance of eventually requiring a pacemaker.   [name] LVH (left ventricular hypertrophy)   . Nonischemic cardiomyopathy ([place]) 2016   LV dysfunction due to LBBB/septal dyssynchrony  . Osteoarthritis    bilat knees--needs bilat TKA.  Ortho is trying steroid injections as of 10/23/15.  . Presence of permanent cardiac pacemaker 05/05/2017  . Seasonal allergies   . Solitary pulmonary nodule 05/2016   8 mm pleural based nodule in RLL.  Radiology recommended f/u noncontrast CT in 6-12 mo.    Past Surgical History:  Procedure Laterality Date  . ABDOMINAL HYSTERECTOMY  1963   At the time of her last C/S; says she had partial bladder resection at that time as well  . BI-VENTRICULAR PACEMAKER INSERTION (CRT-P)  05/05/2017  . BIV PACEMAKER INSERTION CRT-P N/A 05/05/2017   Procedure: BIV PACEMAKER INSERTION CRT-P;  Surgeon: Evans Lance, MD;  Location: [facility];  Service: Cardiovascular;  Laterality:  N/A;  . CARDIOVASCULAR STRESS TEST  08/2005 & 12/2011   Low risk scans (on the 12/2011 scan she did have EF 39% with moderately severe LV dysfunction with septal dyssynergy probably contributed by LBBB  . Carotid dopplers  11/22/14; 11/30/16   NORMAL 2016 and 2018  . CATARACT EXTRACTION  08/14/11   both  . CESAREAN SECTION  X 5   One of her neonates died soon after birth  . CHOLECYSTECTOMY     1980's  . COLONOSCOPY WITH PROPOFOL N/A 09/02/2017   Procedure: COLONOSCOPY WITH PROPOFOL;  Surgeon: Ladene Artist, MD;  Location: [facility] END[facility];  Service: Endoscopy;  Laterality: N/A;  . HOT HEMOSTASIS N/A 09/02/2017   Procedure: HOT HEMOSTASIS (ARGON PLASMA COAGULATION/BICAP);  Surgeon: Ladene Artist, MD;  Location: [facility] ENDOSCOPY;  Service: Endoscopy;  Laterality: N/A;  . INSERT / REPLACE / REMOVE PACEMAKER    . JOINT REPLACEMENT Bilateral    knee  . right knee surgery Right 05/2016   ? R TKA: no records.  [name] STRABISMUS SURGERY  1939  . TONSILLECTOMY  1938  . TRANSTHORACIC ECHOCARDIOGRAM  01/13/12   Septal dyssynergy, EF 50%, LV relaxation impaired.  No significant valvular abnormalities.    Current Medications: Outpatient Medications Prior to Visit  Medication Sig Dispense Refill  . Cholecalciferol (VITAMIN D3) 1000 units CAPS Take by mouth daily.    . metoprolol tartrate (LOPRESSOR) 25 MG tablet Take 1 tablet (25 mg total) by mouth 2 (two) times daily. 180 tablet 3  . pantoprazole (PROTONIX) 40 MG tablet TAKE 1 TABLET BY MOUTH EVERY DAY 30 tablet 2  . aspirin 81 MG tablet Take only after 3 days . 1 tablet 0   No facility-administered medications prior to visit.      Allergies:   Penicillins; Nickel; Avapro [irbesartan]; Lisinopril; Simvastatin; Latex; Sulfa antibiotics; and Sulfamethoxazole   Social History   Socioeconomic History  . Marital status: Widowed    Spouse name: Not on file  . Number of children: Not on file  . Years of education: Not on file  . Highest education level:  Not on file  Occupational History  . Not on file  Social Needs  . Financial resource strain: Not on file  . Food insecurity:    Worry: Not on file    Inability: Not on file  . Transportation needs:    Medical: Not on file    Non-medical: Not on file  Tobacco Use  . Smoking status: Former Smoker    Last attempt to quit: 05/31/1958    Years since quitting: 59.5  . Smokeless tobacco: Never Used  Substance and Sexual Activity  . Alcohol use: Yes    Alcohol/week: 1.0 standard drinks    Types: 1 Glasses of wine per week  . Drug use: No  . Sexual activity: Not on file  Lifestyle  . Physical activity:    Days per week: Not on file    Minutes per session: Not on file  . Stress: Not on file  Relationships  . Social connections:    Talks on phone: Not on file    Gets together: Not  on file    Attends religious service: Not on file    Active member of club or organization: Not on file    Attends meetings of clubs or organizations: Not on file    Relationship status: Not on file  Other Topics Concern  . Not on file  Social History Narrative   Widower, husband died around 50 (wrongful death per pt's report).   Originally from [place], has been in [place] since about 1997.   Lives alone.  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   Fairly active and independent.   Distant hx of tobacco abuse.  No alcohol or drugs.     Family History:  The patient's family history includes Cancer in her father; Diabetes in her mother; Heart disease in her father and mother; Hyperlipidemia in her mother; Hypertension in her mother.   ROS:   Please see the history of present illness.    ROS all other systems are reviewed and are negative  PHYSICAL EXAM:   VS:  BP 136/64 (BP Location: Right Arm, Patient Position: Sitting, Cuff Size: Normal)   Pulse 62   Ht 5\' 1"  (1.549 m)   Wt 145 lb (65.8 kg)   BMI 27.40 kg/m     General: Alert, oriented x3, no distress,  overweight, well-healed left subclavian pacemaker site Head: no evidence of trauma, PERRL, EOMI, no exophtalmos or lid lag, no myxedema, no xanthelasma; normal ears, nose and oropharynx Neck: normal jugular venous pulsations and no hepatojugular reflux; brisk carotid pulses without delay and no carotid bruits Chest: clear to auscultation, no signs of consolidation by percussion or palpation, normal fremitus, symmetrical and full respiratory excursions Cardiovascular: normal position and quality of the apical impulse, regular rhythm, normal first and second heart sounds, very faint systolic murmur, no rubs or gallops Abdomen: no tenderness or distention, no masses by palpation, no abnormal pulsatility or arterial bruits, normal bowel sounds, no hepatosplenomegaly Extremities: no clubbing, cyanosis or edema; 2+ radial, ulnar and brachial pulses bilaterally; 2+ right femoral, posterior tibial and dorsalis pedis pulses; 2+ left femoral, posterior tibial and dorsalis pedis pulses; no subclavian or femoral bruits Neurological: grossly nonfocal Psych: Normal mood and affect   Wt Readings from Last 3 Encounters:  12/17/17 145 lb (65.8 kg)  12/08/17 144 lb 12.8 oz (65.7 kg)  11/21/17 143 lb (64.9 kg)      Studies/Labs Reviewed:   EKG:  EKG is not ordered today.  The ekg ordered 11/23/2017 shows atrial sensed biventricular paced rhythm with prominent positive R waves in V1  Recent Labs: 05/05/2017: B Natriuretic Peptide 815.4 09/01/2017: Magnesium 2.0 11/21/2017: ALT 13; BUN 13; Creatinine, Ser 0.77; Hemoglobin 13.4; Platelets 261; Potassium 3.8; Sodium 141   Lipid Panel    Component Value Date/Time   CHOL 194 01/07/2016 1055   TRIG 121.0 01/07/2016 1055   HDL 58.40 01/07/2016 1055   CHOLHDL 3 01/07/2016 1055   VLDL 24.2 01/07/2016 1055   LDLCALC 111 (H) 01/07/2016 1055   LDLDIRECT 117.9 05/29/2011 0810    Additional studies/ records that were reviewed today include:  Colonoscopy with Dr.  Fuller Plan in June 2019, notes from Dr. Lovena Le from [facility], NP.  Comprehensive pacemaker check was performed in the clinic today with arrhythmia log results as described above.  There is virtually 100% biventricular pacing battery and lead parameters are excellent.   ASSESSMENT:    1. Paroxysmal atrial fibrillation (HCC)   2. Other cardiomyopathy ([place])   3. CHB (  complete heart block) ([place])   4. Essential hypertension   5. Hypercholesteremia   6. Status post biventricular pacemaker      PLAN:  In order of problems listed above:  1. Afib: She has had 2 lengthy recent episodes of atrial fibrillation documented by her pacemaker.  She is completely asymptomatic.  Rate control is not an issue.  She is at risk for embolic events:  CHADSVasc 3 (age 69, gender, +/- HTN).  She did have mild GI bleeding in June, but none since.  Plan to stop aspirin and start Eliquis.  Discussed the balance of embolic versus bleeding complications.  She should report recurrent hematochezia, melena, hematemesis, hematuria or other bleeding complications promptly. 2. CMP: no clinical evidence of active heart failure. Not requiring diuretics to maintain euvolemic. Reduced left ventricular ejection fraction is at least in part related to left bundle branch block-induced dyssynchrony and is likely to be improved now that she has been a pacing.  Consider repeat echo in the future. 3. CHB: Pacemaker dependent. 4. HTN: Questionable diagnosis per patient, probably has situational hypertension. On beta blocker, SBP is mildly elevated. 5. HLP: mildly elvated LD, no need for pharmacological therapy, absent vascular disease. Concentrate on diet and exercise. 6. CRT-P: Normal device function, followed by Dr. Lovena Le.  She would like to have just one cardiology appointment if possible and will concentrate her care at the device clinic and with Dr. Lovena Le, since all her active issues are arrhythmia related.  Medication Adjustments/Labs  and Tests Ordered: Current medicines are reviewed at length with the patient today.  Concerns regarding medicines are outlined above.  Medication changes, Labs and Tests ordered today are listed in the Patient Instructions below. Patient Instructions  Dr Sallyanne Kuster has recommended making the following medication changes: 1. START Eliquis 5 mg - take 1 tablet twice daily 2. STOP Aspirin  Your physician recommends that you follow-up with him as needed.    Signed, Sanda Klein, MD  12/19/2017 10:30 AM    [facility] [address], [place], [place]  [address] Phone: [phone]; Fax: [phone]

## 2017-12-17 NOTE — Patient Instructions (Signed)
Dr Sallyanne Kuster has recommended making the following medication changes: 1. START Eliquis 5 mg - take 1 tablet twice daily 2. STOP Aspirin  Your physician recommends that you follow-up with him as needed.

## 2017-12-19 ENCOUNTER — Encounter: Payer: Self-pay | Admitting: Cardiovascular Disease

## 2017-12-25 DIAGNOSIS — M7542 Impingement syndrome of left shoulder: Secondary | ICD-10-CM | POA: Diagnosis not present

## 2017-12-25 DIAGNOSIS — I10 Essential (primary) hypertension: Secondary | ICD-10-CM | POA: Diagnosis not present

## 2017-12-29 DIAGNOSIS — I10 Essential (primary) hypertension: Secondary | ICD-10-CM | POA: Diagnosis not present

## 2017-12-29 DIAGNOSIS — M7542 Impingement syndrome of left shoulder: Secondary | ICD-10-CM | POA: Diagnosis not present

## 2018-01-01 DIAGNOSIS — I10 Essential (primary) hypertension: Secondary | ICD-10-CM | POA: Diagnosis not present

## 2018-01-01 DIAGNOSIS — M7542 Impingement syndrome of left shoulder: Secondary | ICD-10-CM | POA: Diagnosis not present

## 2018-01-05 DIAGNOSIS — M7542 Impingement syndrome of left shoulder: Secondary | ICD-10-CM | POA: Diagnosis not present

## 2018-01-05 DIAGNOSIS — I10 Essential (primary) hypertension: Secondary | ICD-10-CM | POA: Diagnosis not present

## 2018-01-08 DIAGNOSIS — I10 Essential (primary) hypertension: Secondary | ICD-10-CM | POA: Diagnosis not present

## 2018-01-08 DIAGNOSIS — M7542 Impingement syndrome of left shoulder: Secondary | ICD-10-CM | POA: Diagnosis not present

## 2018-01-12 DIAGNOSIS — I10 Essential (primary) hypertension: Secondary | ICD-10-CM | POA: Diagnosis not present

## 2018-01-12 DIAGNOSIS — M7542 Impingement syndrome of left shoulder: Secondary | ICD-10-CM | POA: Diagnosis not present

## 2018-01-13 NOTE — Progress Notes (Deleted)
Subjective:   Madison White is a 84 y.o. female who presents for Medicare Annual (Subsequent) preventive examination.  Reports health as Seen by Dr. Juleen China 12/08/2017  Next apt 01/15/2018  Diet Chol/hdl 3  A1c 5.8- Glucose 101   Exercise  Health Maintenance Due  Topic Date Due  . DEXA SCAN  12/11/1995     Colonoscopy 08/2017  Mammogram 10/2017   Ask about shingrix     Objective:     Vitals: There were no vitals taken for this visit.  There is no height or weight on file to calculate BMI.  Advanced Directives 11/21/2017 09/01/2017 05/05/2017 01/14/2017 01/07/2016  Does Patient Have a Medical Advance Directive? Yes No Yes Yes Yes  Type of Advance Directive - - Living will;Healthcare Power of [place];Living will [place];Living will  Does patient want to make changes to medical advance directive? - - No - Patient declined No - Patient declined No - Patient declined  Copy of [place] in Chart? - - No - copy requested No - copy requested No - copy requested  Would patient like information on creating a medical advance directive? - No - Patient declined - - -    Tobacco Social History   Tobacco Use  Smoking Status Former Smoker  . Last attempt to quit: 05/31/1958  . Years since quitting: 59.6  Smokeless Tobacco Never Used     Counseling given: Not Answered   Clinical Intake:     Past Medical History:  Diagnosis Date  . Diastolic dysfunction, left ventricle   . GERD (gastroesophageal reflux disease)   . 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  [name] History of blood transfusion   . History of rheumatic fever   . HTN (hypertension)    hx of refusing treatment--White coat HTN  and/or situational HTN (?)   . Hyperlipidemia    hx of refusing treatment  . Insulin resistance    A1c's excellent per old records (6.1 in 2008 and 2009)  . Left bundle branch block 12/2011   Dr. Sallyanne Kuster at [facility]; ECHO and myocardial perfusion scan showed  septal and apical wall motion abnormality but she had no significant valvular disease and no ischemia.  She did have mildly decreased EF (39% on lexiscan and 50% on echo) and abnl LV relaxation.  Mild amount of PVCs.  Pt at higher risk for other conduction abnormalities, good chance of eventually requiring a pacemaker.   [name] LVH (left ventricular hypertrophy)   . Nonischemic cardiomyopathy ([place]) 2016   LV dysfunction due to LBBB/septal dyssynchrony  . Osteoarthritis    bilat knees--needs bilat TKA.  Ortho is trying steroid injections as of 10/23/15.  . Presence of permanent cardiac pacemaker 05/05/2017  . Seasonal allergies   . Solitary pulmonary nodule 05/2016   8 mm pleural based nodule in RLL.  Radiology recommended f/u noncontrast CT in 6-12 mo.   Past Surgical History:  Procedure Laterality Date  . ABDOMINAL HYSTERECTOMY  1963   At the time of her last C/S; says she had partial bladder resection at that time as well  . BI-VENTRICULAR PACEMAKER INSERTION (CRT-P)  05/05/2017  . BIV PACEMAKER INSERTION CRT-P N/A 05/05/2017   Procedure: BIV PACEMAKER INSERTION  CRT-P;  Surgeon: Evans Lance, MD;  Location: [facility];  Service: Cardiovascular;  Laterality: N/A;  . CARDIOVASCULAR STRESS TEST  08/2005 & 12/2011   Low risk scans (on the 12/2011 scan she did have EF 39% with moderately severe LV dysfunction with septal dyssynergy probably contributed by LBBB  . Carotid dopplers  11/22/14; 11/30/16   NORMAL 2016 and 2018  . CATARACT EXTRACTION  08/14/11   both  . CESAREAN SECTION  X 5   One of her neonates died soon after birth  . CHOLECYSTECTOMY     1980's  . COLONOSCOPY WITH PROPOFOL N/A 09/02/2017   Procedure: COLONOSCOPY WITH  PROPOFOL;  Surgeon: Ladene Artist, MD;  Location: [facility] END[facility];  Service: Endoscopy;  Laterality: N/A;  . HOT HEMOSTASIS N/A 09/02/2017   Procedure: HOT HEMOSTASIS (ARGON PLASMA COAGULATION/BICAP);  Surgeon: Ladene Artist, MD;  Location: [facility] ENDOSCOPY;  Service: Endoscopy;  Laterality: N/A;  . INSERT / REPLACE / REMOVE PACEMAKER    . JOINT REPLACEMENT Bilateral    knee  . right knee surgery Right 05/2016   ? R TKA: no records.  [name] STRABISMUS SURGERY  1939  . TONSILLECTOMY  1938  . TRANSTHORACIC ECHOCARDIOGRAM  01/13/12   Septal dyssynergy, EF 50%, LV relaxation impaired.  No significant valvular abnormalities.   Family History  Problem Relation Age of Onset  . Hypertension Mother   . Heart disease Mother   . Hyperlipidemia Mother   . Diabetes Mother   . Heart disease Father   . Cancer Father    Social History   Socioeconomic History  . Marital status: Widowed    Spouse name: Not on file  . Number of children: Not on file  . Years of education: Not on file  . Highest education level: Not on file  Occupational History  . Not on file  Social Needs  . Financial resource strain: Not on file  . Food insecurity:    Worry: Not on file    Inability: Not on file  . Transportation needs:    Medical: Not on file    Non-medical: Not on file  Tobacco Use  . Smoking status: Former Smoker    Last attempt to quit: 05/31/1958    Years since quitting: 59.6  . Smokeless tobacco: Never Used  Substance and Sexual Activity  . Alcohol use: Yes    Alcohol/week: 1.0 standard drinks    Types: 1 Glasses of wine per week  . Drug use: No  . Sexual activity: Not on file  Lifestyle  . Physical activity:    Days per week: Not on file    Minutes per session: Not on file  . Stress: Not on file  Relationships  . Social connections:    Talks on phone: Not on file    Gets together: Not on file    Attends religious service: Not on file    Active member of club or organization: Not on file     Attends meetings of clubs or organizations: Not on file    Relationship status: Not on file  Other Topics Concern  . Not on file  Social History Narrative   Widower, husband died around 54 (wrongful death per pt's report).   Originally from [place], has been in [place] since about 1997.   Lives alone.  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   Fairly active and independent.   Distant hx of tobacco abuse.  No alcohol  or drugs.    Outpatient Encounter Medications as of 01/14/2018  Medication Sig  . apixaban (ELIQUIS) 5 MG TABS tablet Take 1 tablet (5 mg total) by mouth 2 (two) times daily.  . Cholecalciferol (VITAMIN D3) 1000 units CAPS Take by mouth daily.  . metoprolol tartrate (LOPRESSOR) 25 MG tablet Take 1 tablet (25 mg total) by mouth 2 (two) times daily.  . pantoprazole (PROTONIX) 40 MG tablet TAKE 1 TABLET BY MOUTH EVERY DAY   No facility-administered encounter medications on file as of 01/14/2018.     Activities of Daily Living In your present state of health, do you have any difficulty performing the following activities: 09/01/2017 09/01/2017  Hearing? - N  Vision? - N  Difficulty concentrating or making decisions? - N  Walking or climbing stairs? - N  Comment - -  Dressing or bathing? - N  Doing errands, shopping? [place] and eating ? - -  Using the Toilet? - -  In the past six months, have you accidently leaked urine? - -  Do you have problems with loss of bowel control? - -  Managing your Medications? - -  Managing your Finances? - -  Housekeeping or managing your Housekeeping? - -  Some recent data might be hidden    Patient Care Team: [name], DO as PCP - General (Family Medicine) Croitoru, Dani Gobble, MD as Consulting Physician (Cardiology) Pieter Partridge, DO as Consulting Physician (Neurology) Ninetta Lights, MD as Consulting Physician (Orthopedic Surgery) Gaynelle Arabian, MD as Consulting  Physician (Orthopedic Surgery) Crista Luria, MD as Consulting Physician (Dermatology) [facility]) Nolon Lennert (Dentistry)    Assessment:   This is a routine wellness examination for Maureena.  Exercise Activities and Dietary recommendations    Goals    . <enter goal here> (pt-stated)     "get back in to motion after knee surgery" (in January). Anticipates getting back [employer] for water exercise.        Fall Risk Fall Risk  12/08/2017 01/14/2017 01/07/2016 01/16/2015  Falls in the past year? Yes No No No  Risk Factor Category  High Fall Risk - - -     Depression Screen PHQ 2/9 Scores 12/08/2017 01/14/2017 01/07/2016  PHQ - 2 Score 0 0 0  PHQ- 9 Score 0 - -     Cognitive Function MMSE - Mini Mental State Exam 09/07/2017 01/14/2017 01/07/2016  Orientation to time 5 4 4   Orientation to Place 4 5 5   Registration 3 3 3   Attention/ Calculation 5 5 5   Recall 3 2 3   Language- name 2 objects 2 2 2   Language- repeat 1 1 1   Language- follow 3 step command 3 3 3   Language- read & follow direction 1 1 1   Write a sentence 1 1 1   Copy design 1 1 1   Total score 29 28 29         Immunization History  Administered Date(s) Administered  . Influenza, High Dose Seasonal PF 12/28/2014, 01/14/2017, 12/08/2017  . Influenza,inj,Quad PF,6+ Mos 02/20/2014  . Influenza,inj,Quad PF,6-35 Mos 11/19/2012  . Influenza-Unspecified 12/26/2015  . Pneumococcal Conjugate-13 12/28/2014  . Pneumococcal Polysaccharide-23 05/20/2006  . Tdap 09/08/2011     Screening Tests Health Maintenance  Topic Date Due  . DEXA SCAN  12/11/1995  . TETANUS/TDAP  09/07/2021  . INFLUENZA VACCINE  Completed  . PNA vac Low Risk Adult  Completed        Plan:  PCP Notes ***  Health Maintenance ***  Abnormal Screens  ***  Referrals  ***  Patient concerns; ***  Nurse Concerns; ***  Next PCP apt ***      I have personally reviewed and noted the following in the patient's chart:    . Medical and social history . Use of alcohol, tobacco or illicit drugs  . Current medications and supplements . Functional ability and status . Nutritional status . Physical activity . Advanced directives . List of other physicians . Hospitalizations, surgeries, and ER visits in previous 12 months . Vitals . Screenings to include cognitive, depression, and falls . Referrals and appointments  In addition, I have reviewed and discussed with patient certain preventive protocols, quality metrics, and best practice recommendations. A written personalized care plan for preventive services as well as general preventive health recommendations were provided to patient.     FTDDU,KGURK, RN  01/13/2018

## 2018-01-14 ENCOUNTER — Ambulatory Visit: Payer: Medicare HMO

## 2018-01-14 ENCOUNTER — Encounter: Payer: Self-pay | Admitting: Family Medicine

## 2018-01-14 NOTE — Progress Notes (Signed)
Subjective:    Madison White is a 84 y.o. female and is here for a comprehensive physical exam.  Health Maintenance Due  Topic Date Due  . DEXA SCAN  12/11/1995    Current Outpatient Medications:  .  apixaban (ELIQUIS) 5 MG TABS tablet, Take 1 tablet (5 mg total) by mouth 2 (two) times daily., Disp: 60 tablet, Rfl: 5 .  Cholecalciferol (VITAMIN D3) 1000 units CAPS, Take by mouth daily., Disp: , Rfl:  .  metoprolol tartrate (LOPRESSOR) 25 MG tablet, Take 1 tablet (25 mg total) by mouth 2 (two) times daily., Disp: 180 tablet, Rfl: 3 .  famotidine (PEPCID AC) 10 MG chewable tablet, Chew 1 tablet (10 mg total) by mouth 2 (two) times daily as needed for heartburn., Disp: 60 tablet, Rfl: 3  PMHx, SurgHx, SocialHx, Medications, and Allergies were reviewed in the Visit Navigator and updated as appropriate.   Past Medical History:  Diagnosis Date  . Diastolic dysfunction, left ventricle   . GERD (gastroesophageal reflux disease)   . 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  [name] History of blood transfusion   . History of rheumatic fever   . HTN (hypertension)    hx of refusing treatment--White coat HTN and/or situational HTN (?)   . Hyperlipidemia    hx of refusing treatment  . Insulin resistance    A1c's excellent per old records (6.1 in 2008 and 2009)  . Left bundle branch block 12/2011   Dr. Sallyanne Kuster at [facility]; ECHO and myocardial perfusion scan showed  septal and apical wall motion abnormality but she had no significant valvular disease and no ischemia.  She did have mildly decreased EF (39% on lexiscan and 50% on echo) and abnl LV relaxation.  Mild amount of PVCs.  Pt at higher risk for other conduction abnormalities, good chance of eventually requiring a  pacemaker.   [name] LVH (left ventricular hypertrophy)   . Nonischemic cardiomyopathy ([place]) 2016   LV dysfunction due to LBBB/septal dyssynchrony  . Osteoarthritis    bilat knees--needs bilat TKA.  Ortho is trying steroid injections as of 10/23/15.  . Presence of permanent cardiac pacemaker 05/05/2017  . Seasonal allergies   . Solitary pulmonary nodule 05/2016   8 mm pleural based nodule in RLL.  Radiology recommended f/u noncontrast CT in 6-12 mo.     Past Surgical History:  Procedure Laterality Date  . ABDOMINAL HYSTERECTOMY  1963   At the time of her last C/S; says she had partial bladder resection at that time as well  . BI-VENTRICULAR PACEMAKER INSERTION (CRT-P)  05/05/2017  . BIV PACEMAKER INSERTION CRT-P N/A 05/05/2017   Procedure: BIV PACEMAKER INSERTION CRT-P;  Surgeon: Evans Lance, MD;  Location: [facility];  Service: Cardiovascular;  Laterality: N/A;  . CARDIOVASCULAR STRESS TEST  08/2005 & 12/2011   Low risk scans (on the 12/2011 scan she did have EF 39% with moderately severe LV dysfunction with septal dyssynergy probably contributed by LBBB  . Carotid dopplers  11/22/14; 11/30/16   NORMAL 2016 and 2018  . CATARACT EXTRACTION  08/14/11   both  . CESAREAN SECTION  X 5   One of her neonates died soon after birth  . CHOLECYSTECTOMY     1980's  .  COLONOSCOPY WITH PROPOFOL N/A 09/02/2017   Procedure: COLONOSCOPY WITH PROPOFOL;  Surgeon: Ladene Artist, MD;  Location: [facility] ENDOSCOPY;  Service: Endoscopy;  Laterality: N/A;  . HOT HEMOSTASIS N/A 09/02/2017   Procedure: HOT HEMOSTASIS (ARGON PLASMA COAGULATION/BICAP);  Surgeon: Ladene Artist, MD;  Location: [facility] ENDOSCOPY;  Service: Endoscopy;  Laterality: N/A;  . INSERT / REPLACE / REMOVE PACEMAKER    . JOINT REPLACEMENT Bilateral    knee  . right knee surgery Right 05/2016   ? R TKA: no records.  [name] STRABISMUS SURGERY  1939  . TONSILLECTOMY  1938  . TRANSTHORACIC ECHOCARDIOGRAM  01/13/12   Septal dyssynergy, EF 50%, LV  relaxation impaired.  No significant valvular abnormalities.     Family History  Problem Relation Age of Onset  . Hypertension Mother   . Heart disease Mother   . Hyperlipidemia Mother   . Diabetes Mother   . Heart disease Father   . Cancer Father     Social History   Tobacco Use  . Smoking status: Former Smoker    Last attempt to quit: 05/31/1958    Years since quitting: 59.6  . Smokeless tobacco: Never Used  Substance Use Topics  . Alcohol use: Yes    Alcohol/week: 1.0 standard drinks    Types: 1 Glasses of wine per week  . Drug use: No    Review of Systems:   Pertinent items are noted in the HPI. Otherwise, ROS is negative.  Objective:   BP 132/88   Pulse 72   Temp 97.9 F (36.6 C) (Oral)   Resp 16   Wt 147 lb (66.7 kg)   SpO2 96%   BMI 27.78 kg/m   General appearance: alert, cooperative and appears stated age. Head: normocephalic, without obvious abnormality, atraumatic. Neck: no adenopathy, supple, symmetrical, trachea midline; thyroid not enlarged, symmetric, no tenderness/mass/nodules. Lungs: clear to auscultation bilaterally. Heart: regular rate and rhythm Abdomen: soft, non-tender; no masses,  no organomegaly. Extremities: extremities normal, atraumatic, no cyanosis or edema. Skin: skin color, texture, turgor normal, no rashes or lesions. Lymph: cervical, supraclavicular, and axillary nodes normal; no abnormal inguinal nodes palpated. Neurologic: grossly normal.                                      Assessment/Plan:   Diagnoses and all orders for this visit:  Routine physical examination  Gastroesophageal reflux disease, esophagitis presence not specified -     famotidine (PEPCID AC) 10 MG chewable tablet; Chew 1 tablet (10 mg total) by mouth 2 (two) times daily as needed for heartburn.  Acute pain of left knee Comments: Hx of fall. Continued right knee pain - "feels liike something is in there." Hx of replacement. Will check xray. Discussed  fall precautions.  Orders: -     DG Knee Complete 4 Views Left  Paroxysmal atrial fibrillation ([place]) Comments: Now on Xarelto. Followed by Cardiology.   Patient Counseling: [x]    Nutrition: Stressed importance of moderation in sodium/caffeine intake, saturated fat and cholesterol, caloric balance, sufficient intake of fresh fruits, vegetables, fiber, calcium, iron, and 1 mg of folate supplement per day (for females capable of pregnancy).  [x]    Stressed the importance of regular exercise.   [x]    Substance Abuse: Discussed cessation/primary prevention of tobacco, alcohol, or other drug use; driving or other dangerous activities under the influence; availability of treatment for abuse.   [x]   Injury prevention: Discussed safety belts, safety helmets, smoke detector, smoking near bedding or upholstery.   [x]    Sexuality: Discussed sexually transmitted diseases, partner selection, use of condoms, avoidance of unintended pregnancy  and contraceptive alternatives.  [x]    Dental health: Discussed importance of regular tooth brushing, flossing, and dental visits.  [x]    Health maintenance and immunizations reviewed. Please refer to Health maintenance section.   [name], DO [place]

## 2018-01-15 ENCOUNTER — Encounter: Payer: Self-pay | Admitting: Family Medicine

## 2018-01-15 ENCOUNTER — Ambulatory Visit (INDEPENDENT_AMBULATORY_CARE_PROVIDER_SITE_OTHER): Payer: Medicare HMO | Admitting: Family Medicine

## 2018-01-15 ENCOUNTER — Ambulatory Visit (INDEPENDENT_AMBULATORY_CARE_PROVIDER_SITE_OTHER): Payer: Medicare HMO

## 2018-01-15 VITALS — BP 132/88 | HR 72 | Temp 97.9°F | Resp 16 | Wt 147.0 lb

## 2018-01-15 DIAGNOSIS — I480 Paroxysmal atrial fibrillation: Secondary | ICD-10-CM

## 2018-01-15 DIAGNOSIS — M25562 Pain in left knee: Secondary | ICD-10-CM

## 2018-01-15 DIAGNOSIS — K219 Gastro-esophageal reflux disease without esophagitis: Secondary | ICD-10-CM

## 2018-01-15 DIAGNOSIS — Z0000 Encounter for general adult medical examination without abnormal findings: Secondary | ICD-10-CM

## 2018-01-15 DIAGNOSIS — S8992XA Unspecified injury of left lower leg, initial encounter: Secondary | ICD-10-CM | POA: Diagnosis not present

## 2018-01-15 DIAGNOSIS — M7542 Impingement syndrome of left shoulder: Secondary | ICD-10-CM | POA: Diagnosis not present

## 2018-01-15 DIAGNOSIS — I10 Essential (primary) hypertension: Secondary | ICD-10-CM | POA: Diagnosis not present

## 2018-01-15 MED ORDER — FAMOTIDINE 10 MG PO CHEW: 10.0000 mg | CHEWABLE_TABLET | Freq: Two times a day (BID) | ORAL | 3 refills | Status: DC | PRN

## 2018-01-15 NOTE — Patient Instructions (Signed)
Indigestion Indigestion is a feeling of pain, discomfort, burning, or fullness in the upper part of your abdomen. It can come and go. It may occur frequently or rarely. Indigestion tends to occur while you are eating or right after you have finished eating. It may be worse at night and while bending over or lying down. Follow these instructions at home: Take these actions to decrease your pain or discomfort and to help avoid complications. Diet  Follow a diet as recommended by your health care provider. This may involve avoiding foods and drinks such as: ? Coffee and tea (with or without caffeine). ? Drinks that contain alcohol. ? Energy drinks and sports drinks. ? Carbonated drinks or sodas. ? Chocolate and cocoa. ? Peppermint and mint flavorings. ? Garlic and onions. ? Horseradish. ? Spicy and acidic foods, including peppers, chili powder, curry powder, vinegar, hot sauces, and barbecue sauce. ? Citrus fruit juices and citrus fruits, such as oranges, lemons, and limes. ? Tomato-based foods, such as red sauce, chili, salsa, and pizza with red sauce. ? Fried and fatty foods, such as donuts, french fries, potato chips, and high-fat dressings. ? High-fat meats, such as hot dogs and fatty cuts of red and white meats, such as rib eye steak, sausage, ham, and bacon. ? High-fat dairy items, such as whole milk, butter, and cream cheese.  Eat small, frequent meals instead of large meals.  Avoid drinking large amounts of liquid with your meals.  Avoid eating meals during the 2-3 hours before bedtime.  Avoid lying down right after you eat.  Do not exercise right after you eat. General instructions  Pay attention to any changes in your symptoms.  Take over-the-counter and prescription medicines only as told by your health care provider. Do not take aspirin, ibuprofen, or other NSAIDs unless your health care provider told you to do so.  Do not use any tobacco products, including cigarettes,  chewing tobacco, and e-cigarettes. If you need help quitting, ask your health care provider.  Wear loose-fitting clothing. Do not wear anything tight around your waist that causes pressure on your abdomen.  Raise (elevate) the head of your bed about 6 inches (15 cm).  Try to reduce your stress, such as with yoga or meditation. If you need help reducing stress, ask your health care provider.  If you are overweight, reduce your weight to an amount that is healthy for you. Ask your health care provider for guidance about a safe weight loss goal.  Keep all follow-up visits as told by your health care provider. This is important. Contact a health care provider if:  You have new symptoms.  You have unexplained weight loss.  You have difficulty swallowing, or it hurts to swallow.  Your symptoms do not improve with treatment.  Your symptoms last for more than two days.  You have a fever.  You vomit. Get help right away if:  You have pain in your arms, neck, jaw, teeth, or back.  You feel sweaty, dizzy, or light-headed.  You faint.  You have chest pain or shortness of breath.  You cannot stop vomiting, or you vomit blood.  Your stool is bloody or black.  You have severe pain in your abdomen. This information is not intended to replace advice given to you by your health care provider. Make sure you discuss any questions you have with your health care provider. Document Released: 04/10/2004 Document Revised: 08/09/2015 Document Reviewed: 06/28/2014 Elsevier Interactive Patient Education  [employer].

## 2018-01-17 ENCOUNTER — Encounter: Payer: Self-pay | Admitting: Family Medicine

## 2018-01-22 DIAGNOSIS — I10 Essential (primary) hypertension: Secondary | ICD-10-CM | POA: Diagnosis not present

## 2018-01-22 DIAGNOSIS — M7542 Impingement syndrome of left shoulder: Secondary | ICD-10-CM | POA: Diagnosis not present

## 2018-02-01 ENCOUNTER — Ambulatory Visit (INDEPENDENT_AMBULATORY_CARE_PROVIDER_SITE_OTHER): Payer: Medicare HMO

## 2018-02-01 DIAGNOSIS — I428 Other cardiomyopathies: Secondary | ICD-10-CM

## 2018-02-01 NOTE — Progress Notes (Signed)
Remote pacemaker transmission.   

## 2018-02-04 ENCOUNTER — Encounter: Payer: Self-pay | Admitting: Cardiology

## 2018-03-12 ENCOUNTER — Telehealth: Payer: Self-pay | Admitting: *Deleted

## 2018-03-12 NOTE — Telephone Encounter (Signed)
Please see message. °

## 2018-03-12 NOTE — Telephone Encounter (Signed)
Copied from [place] [id number]. Topic: Appointment Scheduling - Scheduling Inquiry for Clinic >> Mar 12, 2018  3:14 PM Sheran Luz wrote: Reason for CRM: Patient's granddaughter, Cyril Mourning ([place]), called requesting an appointment sooner than available with Dr. Juleen China to discuss patients general health. Cyril Mourning expressed concerns about patients memory that she would like to discuss at appointment. Scheduled patient for 1/17. Please advise.

## 2018-03-19 ENCOUNTER — Encounter: Payer: Self-pay | Admitting: Family Medicine

## 2018-03-19 NOTE — Telephone Encounter (Signed)
Called patent granddaughter moved app up.

## 2018-03-23 ENCOUNTER — Ambulatory Visit (INDEPENDENT_AMBULATORY_CARE_PROVIDER_SITE_OTHER): Payer: Medicare HMO | Admitting: Family Medicine

## 2018-03-23 VITALS — BP 124/62 | HR 50 | Temp 98.7°F | Ht 61.0 in | Wt 148.2 lb

## 2018-03-23 DIAGNOSIS — R5383 Other fatigue: Secondary | ICD-10-CM

## 2018-03-23 DIAGNOSIS — K922 Gastrointestinal hemorrhage, unspecified: Secondary | ICD-10-CM | POA: Diagnosis not present

## 2018-03-23 DIAGNOSIS — K5909 Other constipation: Secondary | ICD-10-CM

## 2018-03-23 DIAGNOSIS — R05 Cough: Secondary | ICD-10-CM | POA: Diagnosis not present

## 2018-03-23 LAB — CBC WITH DIFFERENTIAL/PLATELET
Basophils Absolute: 0 10*3/uL (ref 0.0–0.1)
Basophils Relative: 0.5 % (ref 0.0–3.0)
Eosinophils Absolute: 0.1 10*3/uL (ref 0.0–0.7)
Eosinophils Relative: 1.1 % (ref 0.0–5.0)
HCT: 45.4 % (ref 36.0–46.0)
Hemoglobin: 15.2 g/dL — ABNORMAL HIGH (ref 12.0–15.0)
Lymphocytes Relative: 18.6 % (ref 12.0–46.0)
Lymphs Abs: 1.8 10*3/uL (ref 0.7–4.0)
MCHC: 33.4 g/dL (ref 30.0–36.0)
MCV: 86.8 fl (ref 78.0–100.0)
Monocytes Absolute: 0.7 10*3/uL (ref 0.1–1.0)
Monocytes Relative: 6.9 % (ref 3.0–12.0)
Neutro Abs: 6.9 10*3/uL (ref 1.4–7.7)
Neutrophils Relative %: 72.9 % (ref 43.0–77.0)
Platelets: 309 10*3/uL (ref 150.0–400.0)
RBC: 5.24 Mil/uL — ABNORMAL HIGH (ref 3.87–5.11)
RDW: 15 % (ref 11.5–15.5)
WBC: 9.5 10*3/uL (ref 4.0–10.5)

## 2018-03-23 LAB — COMPREHENSIVE METABOLIC PANEL
ALT: 11 U/L (ref 0–35)
AST: 18 U/L (ref 0–37)
Albumin: 4.1 g/dL (ref 3.5–5.2)
Alkaline Phosphatase: 46 U/L (ref 39–117)
BUN: 13 mg/dL (ref 6–23)
CO2: 31 mEq/L (ref 19–32)
Calcium: 10.1 mg/dL (ref 8.4–10.5)
Chloride: 105 mEq/L (ref 96–112)
Creatinine, Ser: 0.94 mg/dL (ref 0.40–1.20)
GFR: 59.83 mL/min — ABNORMAL LOW (ref >60.00)
Glucose, Bld: 109 mg/dL — ABNORMAL HIGH (ref 70–99)
Potassium: 4.1 mEq/L (ref 3.5–5.1)
Sodium: 143 mEq/L (ref 135–145)
Total Bilirubin: 0.5 mg/dL (ref 0.2–1.2)
Total Protein: 6.9 g/dL (ref 6.0–8.3)

## 2018-03-23 LAB — TSH: TSH: 4.11 u[IU]/mL (ref 0.35–4.50)

## 2018-03-23 LAB — VITAMIN B12: Vitamin B-12: 457 pg/mL (ref 211–911)

## 2018-03-23 MED ORDER — ESOMEPRAZOLE MAGNESIUM 40 MG PO CPDR: 40.0000 mg | DELAYED_RELEASE_CAPSULE | Freq: Every day | ORAL | 3 refills | Status: DC

## 2018-03-23 NOTE — Patient Instructions (Signed)
How to Do a Fecal Occult Blood Test (FOBT)  This information will teach you how to collect stool samples at home for your fecal occult blood test (FOBT). About Your FOBT A FOBT is a lab test used to check your stool (feces) for occult blood. Occult blood is blood that can't be seen just by looking at your stool. There are many reasons you may have blood in your stool. Your doctor or nurse will tell you why you're having the test. For your FOBT, you will collect samples of your stool for 3 days in a row. This increases the chance of finding blood, since the bleeding may not happen every day. If you don't have a bowel movement on one of the 3 days, talk with your doctor or nurse. They will talk with you about collecting the 3 samples over a longer period of time. Preparing for Your FOBT Ask your doctor or nurse how many days before the test you should start to prepare. They may give you special instructions, which you can write in the "Notes" area at the end of this section. It's important to follow the instructions below to be sure your test results are accurate. . Starting 3 days before you begin collecting your stool samples, avoid:  o Red meat, such as beef, lamb, or liver o Raw fruits and vegetables o Vitamin C (ascorbic acid), such as fruit juices containing vitamin C and vitamin C supplements in doses higher than 250 milligrams (mg) per day o Antacids (medications to relieve heartburn or stomach pain, such as Tums) o Medications to stop diarrhea (loose or watery bowel movements) o Iron supplements You can start eating these things again after your FOBT. [name] Most people will need to stop taking aspirin, other nonsteroidal anti-inflammatory drugs (NSAIDs), and vitamin E before and during the 3-day collection period. These medications may cause small amounts of blood to appear in your stool.  o Your nurse will give you a resource called Common Medications Containing Aspirin and Other Nonsteroidal  Anti-inflammatory Drugs, which lists common medications that have these products in them. It also lists medications you can take instead. o If you take aspirin to prevent a heart attack or stroke, don't stop taking it unless your doctor instructs you to. . If you have any of the following  during the 3 days you're planning to collect your stool samples, talk with your doctor or nurse. They may tell you to wait to collect your samples.  o Menstrual period o Bleeding hemorrhoids (swollen veins in your anus) o Blood in your urine Collecting the Sample  Figure 1. The Hemoccult side  Supplies You will need the following supplies: . Hemoccult slide . Applicator stick . A clean, dry container . Trash can Procedure 1. Gather your supplies. Place them in the bathroom where you can reach them easily.  o Remove the Hemoccult slide from its paper envelope. Set the envelope aside. o Don't set the Hemoccult slide or applicator stick on the edge of the sink, bath tub, or toilet tank. They shouldn't get wet. o Always keep the Hemoccult slides at room temperature, away from heat and light. 2. Open the large front flap of the Hemoccult slide. You may notice a light blue discoloration on the paper in the squares above boxes A and B. The discoloration won't affect the test. 3. Sit on the toilet like you usually do to pass stool (have a bowel movement).  Use the clean, dry container to catch your  stool before it touches the water in the toilet. 4. Take a sample of your stool with one end of an applicator stick. Apply a thin smear of stool inside the square marked "A" on the Hemoccult slide (see Figure 1). 5. Use the stick to collect a second sample from a different part of your stool. Apply a thin smear of stool inside the square marked "B". 6. Throw out the stick in the wastebasket. 7. Close the cover of the Hemoccult slide and put it back into its paper envelope. Don't put it in anything waterproof, such as a  plastic bag. Store it at room temperature, away from light, children, and pets. 8. Empty the container that holds the stool into the toilet. Flush the remainder of your stool. 9. Wash your hands. Wet your hands with warm water and then rub your hands with soap for at least 15 to 20 seconds. Rinse your hands well under warm running water. Repeat these steps to collect your samples on days 2 and 3.

## 2018-03-23 NOTE — Progress Notes (Signed)
Madison White is a 85 y.o. female is here for follow up.  History of Present Illness:   [name], CMA acting as scribe for Dr. [name].   HPI:   Scalp: she has noticed sore on top of scalp that has bumps. She has noticed about a year ago but has gotten worse over time. Very itchy. She has tried some hot oil on it but nothing else.   Fatigue: that has increased over time with some times she feels lethargic. She has had some memory changes over this period as well.    She had a wonderful time with her family during the holidays and feels that she is just still recovering from the fatigue associated with that. Hx of UGI bleed. Minimal stomach discomfort, dull. Some darker stools. No tarry stools, BRBPR, N/V. Had URI over the holidays, with some GI issues of diarrhea. Resolving. No dysuria, frequency, urgency.   Health Maintenance Due  Topic Date Due  . DEXA SCAN  12/11/1995   Depression screen [facility] 2/9 12/08/2017 01/14/2017 01/07/2016  Decreased Interest 0 0 0  Down, Depressed, Hopeless 0 0 0  PHQ - 2 Score 0 0 0  Altered sleeping 0 - -  Tired, decreased energy 0 - -  Change in appetite 0 - -  Feeling bad or failure about yourself  0 - -  Trouble concentrating 0 - -  Moving slowly or fidgety/restless 0 - -  Suicidal thoughts 0 - -  PHQ-9 Score 0 - -  Difficult doing work/chores Not difficult at all - -   PMHx, SurgHx, SocialHx, FamHx, Medications, and Allergies were reviewed in the Visit Navigator and updated as appropriate.   Patient Active Problem List   Diagnosis Date Noted  . Acute lower GI bleeding   . Hematochezia   . GI bleed 08/31/2017  . Complete heart block ([place]) 05/05/2017  . Renal cyst 03/01/2017  . Hydronephrosis with urinary obstruction due to renal calculus 03/01/2017  . Total knee replacement status, bilateral 11/06/2016  . Nonischemic cardiomyopathy ([place]) 11/17/2014  . Upper airway cough syndrome 02/26/2014  . Constipation, chronic  02/26/2014  . GERD (gastroesophageal reflux disease) 11/19/2012  . Dyslipidemia 09/02/2012  . Left bundle branch block 12/18/2011  . HTN (hypertension), benign 05/31/2011   Social History   Tobacco Use  . Smoking status: Former Smoker    Last attempt to quit: 05/31/1958    Years since quitting: 59.8  . Smokeless tobacco: Never Used  Substance Use Topics  . Alcohol use: Yes    Alcohol/week: 1.0 standard drinks    Types: 1 Glasses of wine per week  . Drug use: No   Current Medications and Allergies:   .  apixaban (ELIQUIS) 5 MG TABS tablet, Take 1 tablet (5 mg total) by mouth 2 (two) times daily., Disp: 60 tablet, Rfl: 5 .  Cholecalciferol (VITAMIN D3) 1000 units CAPS, Take by mouth daily., Disp: , Rfl:  .  famotidine (PEPCID AC) 10 MG chewable tablet, Chew 1 tablet (10 mg total) by mouth 2 (two) times daily as needed for heartburn., Disp: 60 tablet, Rfl: 3 .  metoprolol tartrate (LOPRESSOR) 25 MG tablet, Take 1 tablet (25 mg total) by mouth 2 (two) times daily., Disp: 180 tablet, Rfl: 3   Allergies  Allergen Reactions  . Penicillins Hives and Swelling    Has patient had a PCN reaction causing immediate rash, facial/tongue/throat swelling, SOB or lightheadedness with hypotension: Yes Has patient had a PCN reaction causing  severe rash involving mucus membranes or skin necrosis: No Has patient had a PCN reaction that required hospitalization: No Has patient had a PCN reaction occurring within the last 10 years: No If all of the above answers are "NO", then may proceed with Cephalosporin use.   . Nickel Swelling and Rash  . Avapro [Irbesartan] Other (See Comments)    Unknown rxn  . Lisinopril Swelling    Swelling around eyes; also says it caused increased sugar and BP  . Simvastatin Other (See Comments)    unknown  . Latex Rash  . Sulfa Antibiotics Rash  . Sulfamethoxazole Rash   Review of Systems   Pertinent items are noted in the HPI. Otherwise, a complete ROS is  negative.  Vitals:   Vitals:   03/23/18 0954  BP: 124/62  Pulse: (!) 50  Temp: 98.7 F (37.1 C)  TempSrc: Oral  SpO2: 96%  Weight: 148 lb 3.2 oz (67.2 kg)  Height: 5\' 1"  (1.549 m)     Body mass index is 28 kg/m.  Physical Exam:   Physical Exam Vitals signs and nursing note reviewed.  HENT:     Head: Normocephalic and atraumatic.  Eyes:     Pupils: Pupils are equal, round, and reactive to light.  Neck:     Musculoskeletal: Normal range of motion and neck supple.  Cardiovascular:     Rate and Rhythm: Normal rate and regular rhythm.     Heart sounds: Normal heart sounds.  Pulmonary:     Effort: Pulmonary effort is normal.  Abdominal:     Palpations: Abdomen is soft.  Skin:    General: Skin is warm.  Psychiatric:        Behavior: Behavior normal.    Assessment and Plan:   [name] was seen today for follow-up.  Diagnoses and all orders for this visit:  Fatigue, unspecified type -     CBC with Differential/Platelet -     Comprehensive metabolic panel -     Iron, TIBC and Ferritin Panel -     TSH -     Vitamin B12  Constipation, chronic -     CBC with Differential/Platelet -     Comprehensive metabolic panel -     TSH -     Fecal occult blood, imunochemical; Future  Gastrointestinal hemorrhage, unspecified gastrointestinal hemorrhage type -     CBC with Differential/Platelet -     Comprehensive metabolic panel -     Iron, TIBC and Ferritin Panel -     esomeprazole (NEXIUM) 40 MG capsule; Take 1 capsule (40 mg total) by mouth daily. -     Fecal occult blood, imunochemical; Future   . Orders and follow up as documented in [place], reviewed diet, exercise and weight control, cardiovascular risk and specific lipid/LDL goals reviewed, reviewed medications and side effects in detail.  . Reviewed expectations re: course of current medical issues. . Outlined signs and symptoms indicating need for more acute intervention. . Patient verbalized understanding and  all questions were answered. . Patient received an After Visit Summary.  CMA served as Education administrator during this visit. History, Physical, and Plan performed by medical provider. The above documentation has been reviewed and is accurate and complete. [name], D.O.  [name], DO [place], [place] [facility] 03/31/2018

## 2018-03-24 LAB — IRON,TIBC AND FERRITIN PANEL
%SAT: 13 % (calc) — ABNORMAL LOW (ref 16–45)
Ferritin: 31 ng/mL (ref 16–288)
Iron: 56 ug/dL (ref 45–160)
TIBC: 425 mcg/dL (calc) (ref 250–450)

## 2018-03-30 LAB — CUP PACEART REMOTE DEVICE CHECK
Battery Remaining Longevity: 90 mo
Brady Statistic AP VP Percent: 7.6 %
Brady Statistic AP VS Percent: 1 %
Brady Statistic AS VP Percent: 88 %
Brady Statistic AS VS Percent: 2.1 %
Brady Statistic RA Percent Paced: 5.3 %
Implantable Lead Implant Date: 20190219
Implantable Lead Location: 753858
Implantable Lead Location: 753860
Lead Channel Impedance Value: 490 Ohm
Lead Channel Impedance Value: 600 Ohm
Lead Channel Impedance Value: 910 Ohm
Lead Channel Pacing Threshold Amplitude: 0.5 V
Lead Channel Pacing Threshold Amplitude: 1.5 V
Lead Channel Pacing Threshold Pulse Width: 0.5 ms
Lead Channel Sensing Intrinsic Amplitude: 12 mV
Lead Channel Setting Pacing Amplitude: 2.5 V
Lead Channel Setting Pacing Amplitude: 2.5 V
Lead Channel Setting Pacing Pulse Width: 0.5 ms
Lead Channel Setting Sensing Sensitivity: 4 mV
MDC IDC LEAD IMPLANT DT: 20190219
MDC IDC LEAD IMPLANT DT: 20190219
MDC IDC LEAD LOCATION: 753859
MDC IDC MSMT BATTERY REMAINING PERCENTAGE: 95.5 %
MDC IDC MSMT BATTERY VOLTAGE: 2.99 V
MDC IDC MSMT LEADCHNL LV PACING THRESHOLD PULSEWIDTH: 0.8 ms
MDC IDC MSMT LEADCHNL RA PACING THRESHOLD AMPLITUDE: 0.75 V
MDC IDC MSMT LEADCHNL RA SENSING INTR AMPL: 1.7 mV
MDC IDC MSMT LEADCHNL RV PACING THRESHOLD PULSEWIDTH: 0.5 ms
MDC IDC PG IMPLANT DT: 20190219
MDC IDC PG SERIAL: 8995296
MDC IDC SESS DTM: 20191118070015
MDC IDC SET LEADCHNL LV PACING PULSEWIDTH: 0.8 ms
MDC IDC SET LEADCHNL RA PACING AMPLITUDE: 2 V

## 2018-03-31 ENCOUNTER — Encounter: Payer: Self-pay | Admitting: Family Medicine

## 2018-03-31 ENCOUNTER — Ambulatory Visit (INDEPENDENT_AMBULATORY_CARE_PROVIDER_SITE_OTHER): Payer: Medicare HMO | Admitting: Family Medicine

## 2018-03-31 ENCOUNTER — Ambulatory Visit (INDEPENDENT_AMBULATORY_CARE_PROVIDER_SITE_OTHER): Payer: Medicare HMO | Admitting: Sports Medicine

## 2018-03-31 ENCOUNTER — Encounter: Payer: Self-pay | Admitting: Sports Medicine

## 2018-03-31 ENCOUNTER — Ambulatory Visit: Payer: Self-pay

## 2018-03-31 VITALS — BP 150/80 | HR 73 | Temp 97.4°F | Ht 61.0 in | Wt 146.8 lb

## 2018-03-31 VITALS — BP 150/80 | HR 73 | Ht 61.0 in | Wt 146.8 lb

## 2018-03-31 DIAGNOSIS — M25511 Pain in right shoulder: Secondary | ICD-10-CM

## 2018-03-31 NOTE — Patient Instructions (Signed)

## 2018-03-31 NOTE — Progress Notes (Signed)
Madison White is a 85 y.o. female here for an acute visit.  History of Present Illness:   Madison White,Madison White,acting as a scribe for [employer], DO.,have documented all relevant documentation on the behalf of Madison Deutscher, DO,as directed by  Madison Deutscher, DO while in the presence of Madison Deutscher, DO. 85 yo patient present for right arm pain that radiates to Right hand.  Arm Pain   The incident occurred 12 to 24 hours ago. The incident occurred at home. The injury mechanism is unknown. The pain is present in the upper right arm. The quality of the pain is described as stabbing. The pain radiates to the right hand. The pain is at a severity of 10/10. The pain is severe. The pain has been constant since the incident. Associated symptoms comments: Shooting pain in forehead that radiates to scalp.. The symptoms are aggravated by movement and lifting. She has tried ice and heat for the symptoms.   PMHx, SurgHx, SocialHx, Medications, and Allergies were reviewed in the Visit Navigator and updated as appropriate.  Current Medications:   .  apixaban (ELIQUIS) 5 MG TABS tablet, Take 1 tablet (5 mg total) by mouth 2 (two) times daily., Disp: 60 tablet, Rfl: 5 .  esomeprazole (NEXIUM) 40 MG capsule, Take 1 capsule (40 mg total) by mouth daily., Disp: 30 capsule, Rfl: 3 .  metoprolol tartrate (LOPRESSOR) 25 MG tablet, Take 1 tablet (25 mg total) by mouth 2 (two) times daily., Disp: 180 tablet, Rfl: 3   Allergies  Allergen Reactions  . Penicillins Hives and Swelling    Has patient had a PCN reaction causing immediate rash, facial/tongue/throat swelling, SOB or lightheadedness with hypotension: Yes Has patient had a PCN reaction causing severe rash involving mucus membranes or skin necrosis: No Has patient had a PCN reaction that required hospitalization: No Has patient had a PCN reaction occurring within the last 10 years: No If all of the above answers are "NO", then may proceed with  Cephalosporin use.   . Nickel Swelling and Rash  . Avapro [Irbesartan] Other (See Comments)    Unknown rxn  . Lisinopril Swelling    Swelling around eyes; also says it caused increased sugar and BP  . Simvastatin Other (See Comments)    unknown  . Latex Rash  . Sulfa Antibiotics Rash  . Sulfamethoxazole Rash   Review of Systems:   Pertinent items are noted in the HPI. Otherwise, ROS is negative.  Vitals:   Vitals:   03/31/18 1243  BP: (!) 150/80  Pulse: 73  Temp: (!) 97.4 F (36.3 C)  TempSrc: Oral  SpO2: 96%  Weight: 146 lb 12.8 oz (66.6 kg)  Height: 5\' 1"  (1.549 m)     Body mass index is 27.74 kg/m.  Physical Exam:   Physical Exam Vitals signs and nursing note reviewed.  HENT:     Head: Normocephalic and atraumatic.  Eyes:     Pupils: Pupils are equal, round, and reactive to light.  Neck:     Musculoskeletal: Normal range of motion and neck supple.  Cardiovascular:     Rate and Rhythm: Normal rate.  Pulmonary:     Effort: Pulmonary effort is normal.  Abdominal:     Palpations: Abdomen is soft.  Musculoskeletal:     Right shoulder: She exhibits decreased range of motion and tenderness.  Skin:    General: Skin is warm.  Psychiatric:        Behavior: Behavior normal.  Assessment and Plan:   Madison White was seen today for arm pain.  Diagnoses and all orders for this visit:  Acute pain of right shoulder Comments: Concern for tendonitis v rotator cuff injury. Dr. Paulla Fore can see her now.  Orders: -     Ambulatory referral to Sports Medicine    . Reviewed expectations re: course of current medical issues. . Discussed self-management of symptoms. . Outlined signs and symptoms indicating need for more acute intervention. . Patient verbalized understanding and all questions were answered. Marland White Health Maintenance issues including appropriate healthy diet, exercise, and smoking avoidance were discussed with patient. . See orders for this visit as documented  in the electronic medical record. . Patient received an After Visit Summary.  CMA served as Education administrator during this visit. History, Physical, and Plan performed by medical provider. The above documentation has been reviewed and is accurate and complete. Madison White, D.O.  Madison Deutscher, DO [place] 04/03/2018

## 2018-03-31 NOTE — Progress Notes (Signed)
Madison White. Madison White, [place] at [facility]  Madison White - 85 y.o. female MRN [id number]  Date of birth: 01-23-1929  Visit Date: March 31, 2018  PCP: [name], DO   Referred by: [name], DO  SUBJECTIVE:  Chief Complaint  Patient presents with  . New Patient (Initial Visit)    R shoulder pain    HPI: Patient is here on referral from Dr. Juleen China for 2 days of acute onset right shoulder pain.  She denies any known incident.  She is previously been working with physical therapy for right shoulder pain that resolved after home health.  She is on Eliquis and I will take anti-inflammatories but has some heat and use some ice this morning with only minimal improvement.  She is having a hard time using her right upper extremity at all and has pain that does go all the way from her hand all the way to the top of her head.  REVIEW OF SYSTEMS: Denies fevers, chills, recent weight gain or weight loss.  No night sweats. No significant nighttime awakenings due to this issue.  HISTORY:  Prior history reviewed and updated per electronic medical record.  Social History   Occupational History  . Not on file  Tobacco Use  . Smoking status: Former Smoker    Last attempt to quit: 05/31/1958    Years since quitting: 59.8  . Smokeless tobacco: Never Used  Substance and Sexual Activity  . Alcohol use: Yes    Alcohol/week: 1.0 standard drinks    Types: 1 Glasses of wine per week  . Drug use: No  . Sexual activity: Not on file   Social History   Social History Narrative   Widower, husband died around 71 (wrongful death per pt's report).   Originally from [place], has been in [place] since about 1997.   Lives alone.  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   Fairly active and independent.   Distant hx of tobacco abuse.  No alcohol or drugs.    Past Medical History:  Diagnosis Date  .  Diastolic dysfunction, left ventricle   . GERD (gastroesophageal reflux disease)   . 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  [name] History of blood transfusion   . History of rheumatic fever   . HTN (hypertension)    hx of refusing treatment--White coat HTN and/or situational HTN (?)   . Hyperlipidemia    hx of refusing treatment  . Insulin resistance    A1c's excellent per old records (6.1 in 2008 and 2009)  . Left bundle branch block 12/2011   Dr. Sallyanne Kuster at [facility]; ECHO and myocardial perfusion scan showed  septal and apical wall motion abnormality but she had no significant valvular disease and no ischemia.  She did have mildly decreased EF (39% on lexiscan and 50% on echo) and abnl LV relaxation.  Mild amount of PVCs.  Pt at higher risk for other conduction abnormalities, good chance of eventually requiring a pacemaker.   [name] LVH (left ventricular hypertrophy)   . Nonischemic cardiomyopathy ([place]) 2016   LV dysfunction due to LBBB/septal dyssynchrony  . Osteoarthritis    bilat knees--needs bilat TKA.  Ortho is  trying steroid injections as of 10/23/15.  . Presence of permanent cardiac pacemaker 05/05/2017  . Seasonal allergies   . Solitary pulmonary nodule 05/2016   8 mm pleural based nodule in RLL.  Radiology recommended f/u noncontrast CT in 6-12 mo.     Past Surgical History:  Procedure Laterality Date  . ABDOMINAL HYSTERECTOMY  1963   At the time of her last C/S; says she had partial bladder resection at that time as well  . BI-VENTRICULAR PACEMAKER INSERTION (CRT-P)  05/05/2017  . BIV PACEMAKER INSERTION CRT-P N/A 05/05/2017   Procedure: BIV PACEMAKER INSERTION CRT-P;  Surgeon: Evans Lance, MD;  Location: [facility];  Service: Cardiovascular;   Laterality: N/A;  . CARDIOVASCULAR STRESS TEST  08/2005 & 12/2011   Low risk scans (on the 12/2011 scan she did have EF 39% with moderately severe LV dysfunction with septal dyssynergy probably contributed by LBBB  . Carotid dopplers  11/22/14; 11/30/16   NORMAL 2016 and 2018  . CATARACT EXTRACTION  08/14/11   both  . CESAREAN SECTION  X 5   One of her neonates died soon after birth  . CHOLECYSTECTOMY     1980's  . COLONOSCOPY WITH PROPOFOL N/A 09/02/2017   Procedure: COLONOSCOPY WITH PROPOFOL;  Surgeon: Ladene Artist, MD;  Location: [facility];  Service: Endoscopy;  Laterality: N/A;  . HOT HEMOSTASIS N/A 09/02/2017   Procedure: HOT HEMOSTASIS (ARGON PLASMA COAGULATION/BICAP);  Surgeon: Ladene Artist, MD;  Location: [facility] ENDOSCOPY;  Service: Endoscopy;  Laterality: N/A;  . INSERT / REPLACE / REMOVE PACEMAKER    . JOINT REPLACEMENT Bilateral    knee  . right knee surgery Right 05/2016   ? R TKA: no records.  [name] STRABISMUS SURGERY  1939  . TONSILLECTOMY  1938  . TRANSTHORACIC ECHOCARDIOGRAM  01/13/12   Septal dyssynergy, EF 50%, LV relaxation impaired.  No significant valvular abnormalities.    family history includes Cancer in her father; Diabetes in her mother; Heart disease in her father and mother; Hyperlipidemia in her mother; Hypertension in her mother.  DATA OBTAINED & REVIEWED:  Recent Labs    09/07/17 1515 11/21/17 2124 03/23/18 1032  CALCIUM 9.4 8.7* 10.1  AST 20 23 18   ALT 12 13 11   TSH  --   --  4.11   No problems updated. No specialty comments available.  OBJECTIVE:  VS:  HT:5\' 1"  (154.9 cm)   WT:146 lb 12.8 oz (66.6 kg)  BMI:27.75    BP: (!) 150/80  HR:73bpm  TEMP: ( )  RESP:96 %   PHYSICAL EXAM: Adult elderly female.  No acute distress.  Alert and appropriate. Right shoulder has marked pain with any type of active or passive range of motion.  She is in inability to reach out and shake my hand.  He has intact grip strength.  Total shoulder arc is only 30  to 40 degrees circumduction.  She is only able to approximate 20 degrees of shoulder begins.   ASSESSMENT  1. Acute pain of right shoulder      PROCEDURES:  US Guided Injection per procedure note  PLAN:  Pertinent additional documentation may be included in corresponding procedure notes, imaging studies, problem based documentation and patient instructions.  No problem-specific Assessment & Plan notes found for this encounter. Likely some underlying degenerative arthritis with possible rotator cuff.  She will hopefully respond well to the ultrasound traction plan to see her in 2 weeks to advance home therapeutic of improvement would  begin with plain film x-rays of the shoulder.   Activity modifications and the importance of avoiding exacerbating activities (limiting pain to no more than a 4 / 10 during or following activity) recommended and discussed. Discussed red flag symptoms that warrant earlier emergent evaluation and patient voices understanding. No orders of the defined types were placed in this encounter. Lab Orders  No laboratory test(s) ordered today   Imaging Orders  [place] MSK POCT ULTRASOUND  Referral Orders  No referral(s) requested today    At follow up will plan : advancing therapeutic exercises Return in about 2 weeks (around 04/14/2018).          Gerda Diss, [facility] Physician

## 2018-03-31 NOTE — Procedures (Signed)
PROCEDURE NOTE:  Ultrasound Guided: Injection: Right shoulder, Intra-articular Images were obtained and interpreted by myself, Teresa Coombs, DO  Images have been saved and stored to PACS system. Images obtained on: GE S7 Ultrasound machine    ULTRASOUND FINDINGS:  Diminutive rotator cuff.  Small effusion appreciated on ultrasound.  DESCRIPTION OF PROCEDURE:  The patient's clinical condition is marked by substantial pain and/or significant functional disability. Other conservative therapy has not provided relief, is contraindicated, or not appropriate. There is a reasonable likelihood that injection will significantly improve the patient's pain and/or functional impairment.   After discussing the risks, benefits and expected outcomes of the injection and all questions were reviewed and answered, the patient wished to undergo the above named procedure.  Verbal consent was obtained.  The ultrasound was used to identify the target structure and adjacent neurovascular structures. The skin was then prepped in sterile fashion and the target structure was injected under direct visualization using sterile technique as below:  Single injection performed as below: PREP: Alcohol and Ethel Chloride APPROACH:posterior, single injection, 25g 1.5 in. INJECTATE: 2 cc 0.5% Marcaine and 2 cc 40mg /mL DepoMedrol ASPIRATE: None DRESSING: Band-Aid  Post procedural instructions including recommending icing and warning signs for infection were reviewed.    This procedure was well tolerated and there were no complications.   IMPRESSION: Succesful Ultrasound Guided: Injection

## 2018-04-01 ENCOUNTER — Other Ambulatory Visit (INDEPENDENT_AMBULATORY_CARE_PROVIDER_SITE_OTHER): Payer: Medicare HMO

## 2018-04-01 DIAGNOSIS — K5909 Other constipation: Secondary | ICD-10-CM | POA: Diagnosis not present

## 2018-04-01 DIAGNOSIS — K922 Gastrointestinal hemorrhage, unspecified: Secondary | ICD-10-CM

## 2018-04-01 LAB — FECAL OCCULT BLOOD, IMMUNOCHEMICAL: Fecal Occult Bld: NEGATIVE

## 2018-04-02 ENCOUNTER — Ambulatory Visit: Payer: Medicare HMO | Admitting: Family Medicine

## 2018-04-03 ENCOUNTER — Encounter: Payer: Self-pay | Admitting: Family Medicine

## 2018-04-14 ENCOUNTER — Ambulatory Visit: Payer: Medicare HMO | Admitting: Sports Medicine

## 2018-04-15 ENCOUNTER — Other Ambulatory Visit: Payer: Self-pay

## 2018-04-15 ENCOUNTER — Encounter: Payer: Self-pay | Admitting: Sports Medicine

## 2018-04-15 ENCOUNTER — Ambulatory Visit: Payer: Medicare HMO | Admitting: Sports Medicine

## 2018-04-15 VITALS — BP 138/76 | HR 82 | Ht 61.0 in | Wt 150.2 lb

## 2018-04-15 DIAGNOSIS — G2589 Other specified extrapyramidal and movement disorders: Secondary | ICD-10-CM | POA: Diagnosis not present

## 2018-04-15 DIAGNOSIS — M25511 Pain in right shoulder: Secondary | ICD-10-CM

## 2018-04-15 DIAGNOSIS — R21 Rash and other nonspecific skin eruption: Secondary | ICD-10-CM

## 2018-04-15 NOTE — Patient Instructions (Signed)
Please perform the exercise program that we have prepared for you and gone over in detail on a daily basis.  In addition to the handout you were provided you can access your program through: [email or web address]   Your unique program code is: [email or web address]

## 2018-04-15 NOTE — Progress Notes (Signed)
Madison White. [place], Madison White at [place]  Madison White - 85 y.o. female MRN [id number]  Date of birth: 17-Dec-1928  Visit Date: April 15, 2018  PCP: [name], DO   Referred by: [name], DO  SUBJECTIVE:  Chief Complaint  Patient presents with  . Right Shoulder - Follow-up    Corticosteroid inj 03/31/2018. Referred to PT. No NSAID's d/t Eliquis.     HPI: Patient reports for 2-week follow-up of right shoulder pain following intra-articular injection.  She is done quite well and reports effectively no pain at this time.  She is sleeping again throughout the night.  She does have a difficult time with overhead reaching and limits this due to the fact that she is previously had a pacemaker although there is no significant limitations.  She is on Eliquis and is unable to take anti-inflammatories.  REVIEW OF SYSTEMS: She does have a typical right sided face pain that occurs 2-3 times per day as well as actinic keratosis spots on her head that she is been referred to dermatology for.  If any persistent ongoing symptoms she will follow-up with Dr. Juleen China for this.    HISTORY:  Prior history reviewed and updated per electronic medical record.  Social History   Occupational History  . Not on file  Tobacco Use  . Smoking status: Former Smoker    Last attempt to quit: 05/31/1958    Years since quitting: 59.9  . Smokeless tobacco: Never Used  Substance and Sexual Activity  . Alcohol use: Yes    Alcohol/week: 1.0 standard drinks    Types: 1 Glasses of wine per week  . Drug use: No  . Sexual activity: Not on file   Social History   Social History Narrative   Widower, husband died around 11 (wrongful death per pt's report).   Originally from [place], has been in [place] since about 1997.   Lives alone.  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   Fairly active and independent.   Distant hx of tobacco abuse.  No alcohol or drugs.     DATA OBTAINED & REVIEWED:  Recent Labs    08/31/17 2010 09/01/17 0900 09/07/17 1515 11/21/17 2124 03/23/18 1032  CALCIUM  --  8.8* 9.4 8.7* 10.1  MG 2.0 2.0  --   --   --   AST  --   --  20 23 18   ALT  --   --  12 13 11   TSH  --   --   --   --  4.11    No problems updated. No specialty comments available.  OBJECTIVE:  VS:  HT:5\' 1"  (154.9 cm)   WT:150 lb 3.2 oz (68.1 kg)  BMI:28.39    BP:138/76  HR:82bpm  TEMP: ( )  RESP:92 %   PHYSICAL EXAM: Patient has full overhead range of motion of her shoulders.  Good internal and external rotation strength.  Slight pain with external rotation that is isolated.  Negative empty can test.  Negative drop arm test.   ASSESSMENT   1. Acute pain of right shoulder   2. Scapular dyskinesis       PROCEDURES:  PROCEDURE NOTE: THERAPEUTIC EXERCISES (40981)   Discussed the foundation of treatment for this condition is physical therapy and/or daily (5-6 days/week) therapeutic exercises, focusing on core strengthening, coordination, neuromuscular control/reeducation. 15 minutes spent for Therapeutic exercises as below and as referenced  in the AVS. This included exercises focusing on stretching, strengthening, with significant focus on eccentric aspects.  Proper technique shown and discussed handout in great detail with ATC. All questions were discussed and answered.   Long term goals include an improvement in range of motion, strength, endurance as well as avoiding reinjury. Frequency of visits is one time as determined during today's office visit. Frequency of exercises to be performed is as per handout.  EXERCISES REVIEWED:   Intrinsic Rotator Cuff Exercises Shoulder range of motion exercises      PLAN:  Pertinent additional documentation may be included in corresponding procedure notes, imaging studies, problem based documentation and patient instructions.  No problem-specific  Assessment & Plan notes found for this encounter. She is doing quite well.  I will have her start on home therapeutic exercises as outlined above.  Okay for repeat injection at any point. Home Therapeutic exercises prescribed today per procedure note.  Activity modifications and the importance of avoiding exacerbating activities (limiting pain to no more than a 4 / 10 during or following activity) recommended and discussed.   Discussed red flag symptoms that warrant earlier emergent evaluation and patient voices understanding.    No orders of the defined types were placed in this encounter. Lab Orders  No laboratory test(s) ordered today   Imaging Orders  No imaging studies ordered today   Referral Orders  No referral(s) requested today     Return if symptoms worsen or fail to improve.          Madison White, [facility] Physician

## 2018-04-19 ENCOUNTER — Ambulatory Visit: Payer: Medicare HMO | Admitting: Family Medicine

## 2018-04-23 DIAGNOSIS — N200 Calculus of kidney: Secondary | ICD-10-CM | POA: Diagnosis not present

## 2018-04-23 DIAGNOSIS — N281 Cyst of kidney, acquired: Secondary | ICD-10-CM | POA: Diagnosis not present

## 2018-05-03 ENCOUNTER — Ambulatory Visit (INDEPENDENT_AMBULATORY_CARE_PROVIDER_SITE_OTHER): Payer: Medicare HMO

## 2018-05-03 DIAGNOSIS — I442 Atrioventricular block, complete: Secondary | ICD-10-CM

## 2018-05-03 LAB — CUP PACEART REMOTE DEVICE CHECK
Brady Statistic AP VP Percent: 6.1 %
Brady Statistic AP VS Percent: 1 %
Brady Statistic AS VP Percent: 91 %
Brady Statistic AS VS Percent: 1.8 %
Implantable Lead Implant Date: 20190219
Implantable Lead Implant Date: 20190219
Implantable Lead Location: 753858
Implantable Lead Location: 753860
Implantable Pulse Generator Implant Date: 20190219
Lead Channel Impedance Value: 610 Ohm
Lead Channel Impedance Value: 890 Ohm
Lead Channel Pacing Threshold Amplitude: 0.5 V
Lead Channel Pacing Threshold Amplitude: 1.5 V
Lead Channel Pacing Threshold Pulse Width: 0.5 ms
Lead Channel Pacing Threshold Pulse Width: 0.8 ms
Lead Channel Setting Pacing Amplitude: 2 V
Lead Channel Setting Pacing Amplitude: 2.5 V
Lead Channel Setting Pacing Pulse Width: 0.5 ms
Lead Channel Setting Pacing Pulse Width: 0.8 ms
Lead Channel Setting Sensing Sensitivity: 4 mV
MDC IDC LEAD IMPLANT DT: 20190219
MDC IDC LEAD LOCATION: 753859
MDC IDC MSMT BATTERY REMAINING LONGEVITY: 91 mo
MDC IDC MSMT BATTERY REMAINING PERCENTAGE: 95.5 %
MDC IDC MSMT BATTERY VOLTAGE: 2.99 V
MDC IDC MSMT LEADCHNL RA IMPEDANCE VALUE: 510 Ohm
MDC IDC MSMT LEADCHNL RA PACING THRESHOLD AMPLITUDE: 0.75 V
MDC IDC MSMT LEADCHNL RA PACING THRESHOLD PULSEWIDTH: 0.5 ms
MDC IDC MSMT LEADCHNL RA SENSING INTR AMPL: 2.1 mV
MDC IDC MSMT LEADCHNL RV SENSING INTR AMPL: 12 mV
MDC IDC SESS DTM: 20200217070020
MDC IDC SET LEADCHNL RV PACING AMPLITUDE: 2.5 V
MDC IDC STAT BRADY RA PERCENT PACED: 4.4 %
Pulse Gen Serial Number: 8995296

## 2018-05-12 NOTE — Progress Notes (Signed)
Remote pacemaker transmission.   

## 2018-05-24 DIAGNOSIS — L570 Actinic keratosis: Secondary | ICD-10-CM | POA: Diagnosis not present

## 2018-05-24 DIAGNOSIS — D229 Melanocytic nevi, unspecified: Secondary | ICD-10-CM | POA: Diagnosis not present

## 2018-05-24 DIAGNOSIS — D485 Neoplasm of uncertain behavior of skin: Secondary | ICD-10-CM | POA: Diagnosis not present

## 2018-05-24 DIAGNOSIS — L821 Other seborrheic keratosis: Secondary | ICD-10-CM | POA: Diagnosis not present

## 2018-05-24 DIAGNOSIS — C44519 Basal cell carcinoma of skin of other part of trunk: Secondary | ICD-10-CM | POA: Diagnosis not present

## 2018-05-30 NOTE — Progress Notes (Signed)
Madison White is a 85 y.o. female here for an acute visit.  History of Present Illness:   Madison White, CMA acting as scribe for Dr. [name].   HPI: Patient in for evaluation of ongoing cough. She states she had fever over the weekend with the highest reading of 100.3. she has not had fever after Saturday. Cough has been productive with clear mucus. She has had runny nose. She denies any muscle pain or sore throat. She has taken over the counter medications with some improvement of symptoms.   PMHx, SurgHx, SocialHx, Medications, and Allergies were reviewed in the Visit Navigator and updated as appropriate.  Current Medications   Current Outpatient Medications:  .  apixaban (ELIQUIS) 5 MG TABS tablet, Take 1 tablet (5 mg total) by mouth 2 (two) times daily., Disp: 60 tablet, Rfl: 5 .  metoprolol tartrate (LOPRESSOR) 25 MG tablet, Take 1 tablet (25 mg total) by mouth 2 (two) times daily., Disp: 180 tablet, Rfl: 3   Allergies  Allergen Reactions  . Penicillins Hives and Swelling    Has patient had a PCN reaction causing immediate rash, facial/tongue/throat swelling, SOB or lightheadedness with hypotension: Yes Has patient had a PCN reaction causing severe rash involving mucus membranes or skin necrosis: No Has patient had a PCN reaction that required hospitalization: No Has patient had a PCN reaction occurring within the last 10 years: No If all of the above answers are "NO", then may proceed with Cephalosporin use.   . Nickel Swelling and Rash  . Avapro [Irbesartan] Other (See Comments)    Unknown rxn  . Lisinopril Swelling    Swelling around eyes; also says it caused increased sugar and BP  . Simvastatin Other (See Comments)    unknown  . Latex Rash  . Sulfa Antibiotics Rash  . Sulfamethoxazole Rash   Review of Systems   Pertinent items are noted in the HPI. Otherwise, ROS is negative.  Vitals   Vitals:   05/31/18 1316  BP: 120/76  Pulse: 67  Temp: 98.6  F (37 C)  TempSrc: Oral  SpO2: 95%     There is no height or weight on file to calculate BMI.  Physical Exam   Physical Exam Vitals signs and nursing note reviewed.  HENT:     Head: Normocephalic and atraumatic.  Eyes:     Pupils: Pupils are equal, round, and reactive to light.  Neck:     Musculoskeletal: Normal range of motion and neck supple.  Cardiovascular:     Rate and Rhythm: Normal rate and regular rhythm.     Heart sounds: Normal heart sounds.  Pulmonary:     Effort: Pulmonary effort is normal.  Abdominal:     Palpations: Abdomen is soft.  Skin:    General: Skin is warm.  Psychiatric:        Behavior: Behavior normal.    Assessment and Plan   Madison White was seen today for cough.  Diagnoses and all orders for this visit:  Viral upper respiratory tract infection -     benzonatate (TESSALON) 200 MG capsule; Take 1 capsule (200 mg total) by mouth 2 (two) times daily as needed for cough.   . Reviewed expectations re: course of current medical issues. . Discussed self-management of symptoms. . Outlined signs and symptoms indicating need for more acute intervention. . Patient verbalized understanding and all questions were answered. [name] Health Maintenance issues including appropriate healthy diet, exercise, and smoking avoidance were discussed with patient. [name]  See orders for this visit as documented in the electronic medical record. . Patient received an After Visit Summary.  CMA served as Education administrator during this visit. History, Physical, and Plan performed by medical provider. The above documentation has been reviewed and is accurate and complete. [name], D.O.  [name], DO [place], [facility] 05/31/2018

## 2018-05-31 ENCOUNTER — Encounter: Payer: Self-pay | Admitting: Family Medicine

## 2018-05-31 ENCOUNTER — Other Ambulatory Visit: Payer: Self-pay

## 2018-05-31 ENCOUNTER — Ambulatory Visit (INDEPENDENT_AMBULATORY_CARE_PROVIDER_SITE_OTHER): Payer: Medicare HMO | Admitting: Family Medicine

## 2018-05-31 VITALS — BP 120/76 | HR 67 | Temp 98.6°F

## 2018-05-31 DIAGNOSIS — J069 Acute upper respiratory infection, unspecified: Secondary | ICD-10-CM | POA: Diagnosis not present

## 2018-05-31 MED ORDER — BENZONATATE 200 MG PO CAPS: 200.0000 mg | ORAL_CAPSULE | Freq: Two times a day (BID) | ORAL | 0 refills | Status: DC | PRN

## 2018-05-31 NOTE — Progress Notes (Signed)
Questions for Screening COVID-19  Symptom onset: 5 days ago  Travel or Contacts: none  During this illness, did/does the patient experience any of the following symptoms? Fever >100.23F []   Yes [x]   No []   Unknown Subjective fever (felt feverish) []   Yes [x]   No []   Unknown Chills []   Yes [x]   No []   Unknown Muscle aches (myalgia) []   Yes [x]   No []   Unknown Runny nose (rhinorrhea) [x]   Yes []   No []   Unknown Sore throat []   Yes [x]   No []   Unknown Cough (new onset or worsening of chronic cough) [x]   Yes []   No []   Unknown Shortness of breath (dyspnea) []   Yes [x]   No []   Unknown Nausea or vomiting []   Yes [x]   No []   Unknown Headache []   Yes [x]   No []   Unknown Abdominal pain  []   Yes []   No [x]   Unknown Diarrhea (?3 loose/looser than normal stools/24hr period) []   Yes [x]   No [x]   Unknown Other, specify:_c/o stomach and shoulders hurting from excessive coughing________________________________________   Patient risk factors: Smoker? []   Current [x]   Former []   Never If female, currently pregnant? []   Yes [x]   No  Patient Active Problem List   Diagnosis Date Noted  . Acute lower GI bleeding   . Hematochezia   . GI bleed 08/31/2017  . Complete heart block ([place]) 05/05/2017  . Renal cyst 03/01/2017  . Hydronephrosis with urinary obstruction due to renal calculus 03/01/2017  . Total knee replacement status, bilateral 11/06/2016  . Nonischemic cardiomyopathy ([place]) 11/17/2014  . Upper airway cough syndrome 02/26/2014  . Constipation, chronic 02/26/2014  . GERD (gastroesophageal reflux disease) 11/19/2012  . Dyslipidemia 09/02/2012  . Left bundle branch block 12/18/2011  . HTN (hypertension), benign 05/31/2011    Plan:  []   High risk for COVID-19 with red flags go to ED (with CP, SOB, weak/lightheaded, or fever > 101.5). Call ahead.  []   High risk for COVID-19 but stable will have car visit. Inform provider and coordinate time. Will be completed in afternoon. [x]   No red flags  but URI signs or symptoms will go through side door and be seen in dedicated room.  Note: Referral to telemedicine is an appropriate alternative disposition for higher risk but stable. [facility]/e-Visit: [phone].

## 2018-07-18 ENCOUNTER — Other Ambulatory Visit: Payer: Self-pay | Admitting: Cardiovascular Disease

## 2018-08-02 ENCOUNTER — Ambulatory Visit (INDEPENDENT_AMBULATORY_CARE_PROVIDER_SITE_OTHER): Payer: Medicare HMO | Admitting: *Deleted

## 2018-08-02 ENCOUNTER — Other Ambulatory Visit: Payer: Self-pay

## 2018-08-02 DIAGNOSIS — I442 Atrioventricular block, complete: Secondary | ICD-10-CM | POA: Diagnosis not present

## 2018-08-02 DIAGNOSIS — I428 Other cardiomyopathies: Secondary | ICD-10-CM

## 2018-08-03 LAB — CUP PACEART REMOTE DEVICE CHECK
Date Time Interrogation Session: 20200519080014
Implantable Lead Implant Date: 20190219
Implantable Lead Implant Date: 20190219
Implantable Lead Implant Date: 20190219
Implantable Lead Location: 753858
Implantable Lead Location: 753859
Implantable Lead Location: 753860
Implantable Pulse Generator Implant Date: 20190219
Pulse Gen Model: 3262
Pulse Gen Serial Number: 8995296

## 2018-08-04 IMAGING — CT CT ANGIO CHEST
2 of 6 series · 18 of 36 positions shown · IV contrast (Omni 300)
Comparison: Chest x-ray 06/11/2016

CLINICAL DATA: Recent knee surgery, off of anticoagulation for 5
days, cough

EXAM:
CT ANGIOGRAPHY CHEST WITH CONTRAST
TECHNIQUE: Multidetector CT imaging of the chest was performed using the
standard protocol during bolus administration of intravenous
contrast. Multiplanar CT image reconstructions and MIPs were
obtained to evaluate the vascular anatomy.
CONTRAST:  100 cc Isovue

[Series 7: pe thins · axial · 0.64mm/px · z∈[+1166,+1363]mm · 17 of 223 slices shown]
[im 13/223  lung]
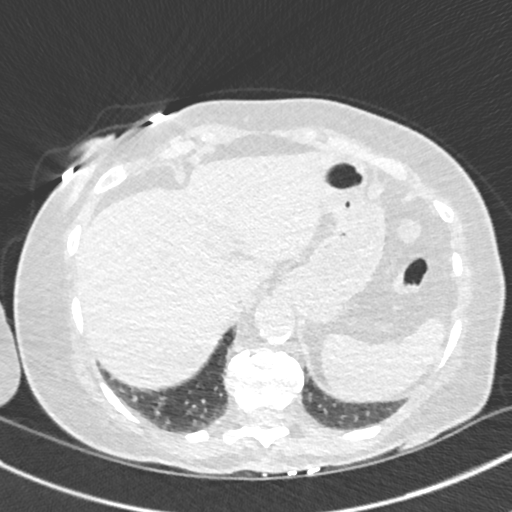
[im 25/223  mediastinal]
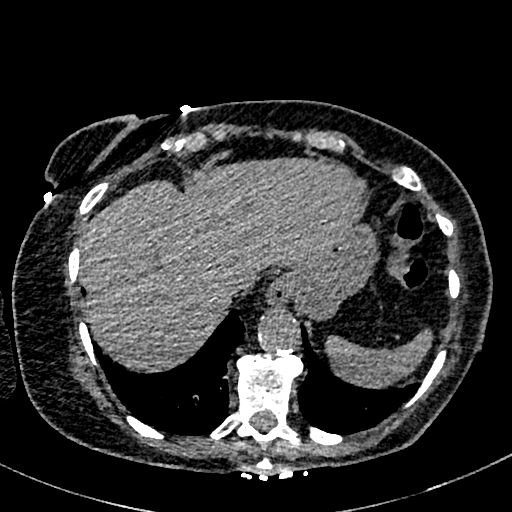
[im 38/223  lung]
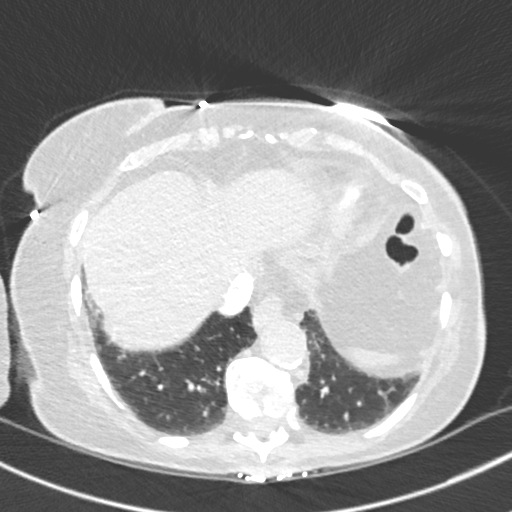
[im 50/223  mediastinal]
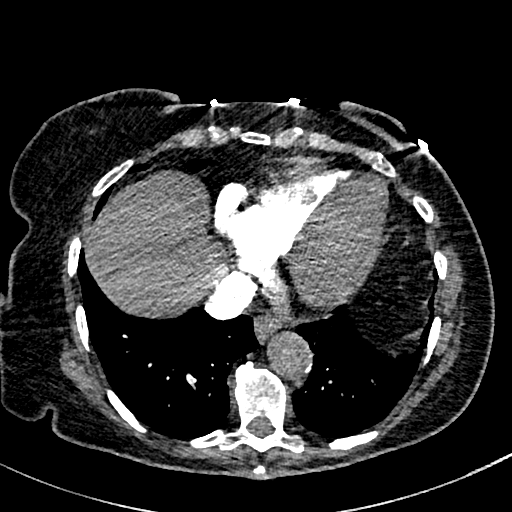
[im 62/223  lung]
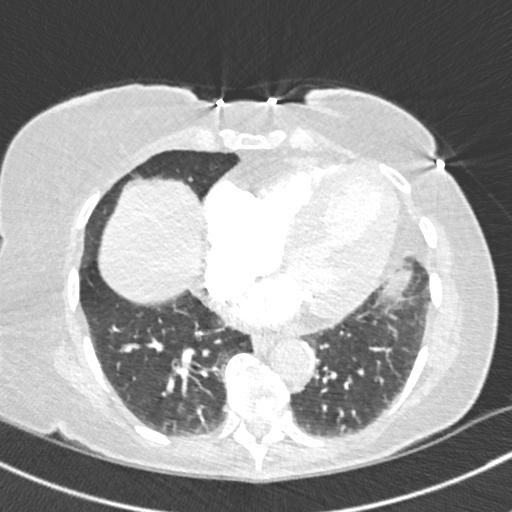
[im 75/223  mediastinal]
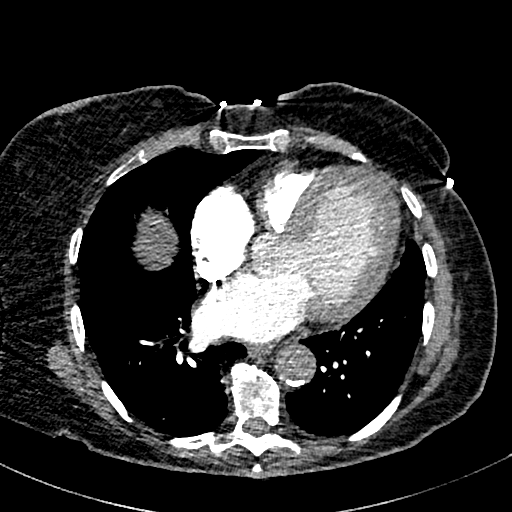
[im 87/223  lung]
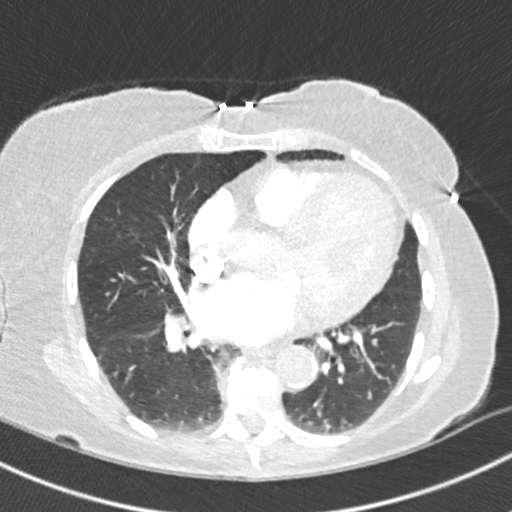
[im 99/223  mediastinal]
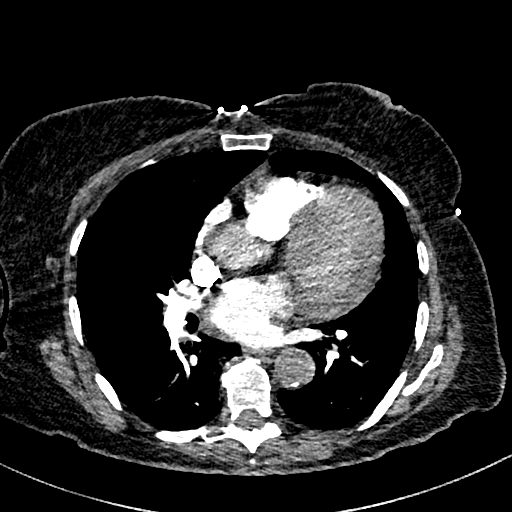
[im 112/223  lung]
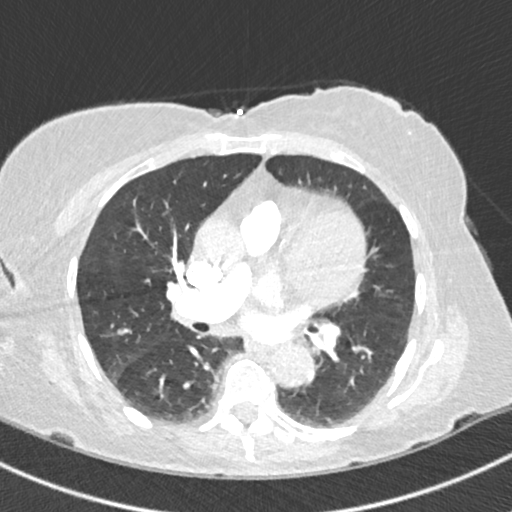
[im 124/223  mediastinal]
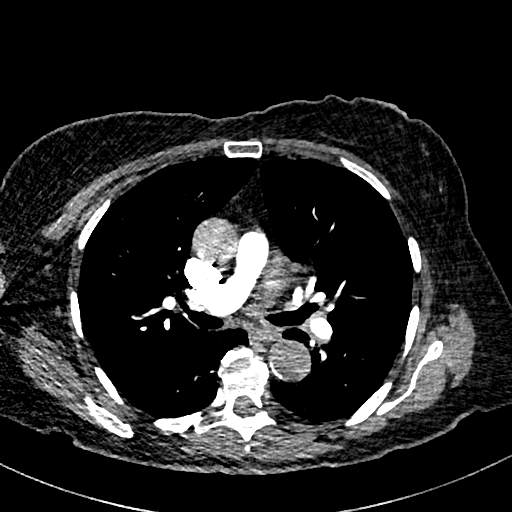
[im 136/223  lung]
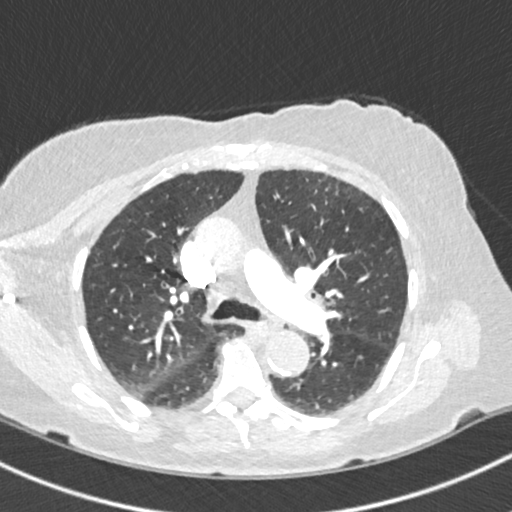
[im 149/223  mediastinal]
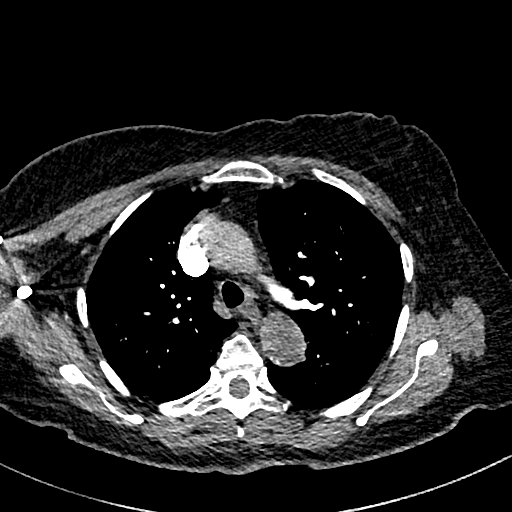
[im 161/223  lung]
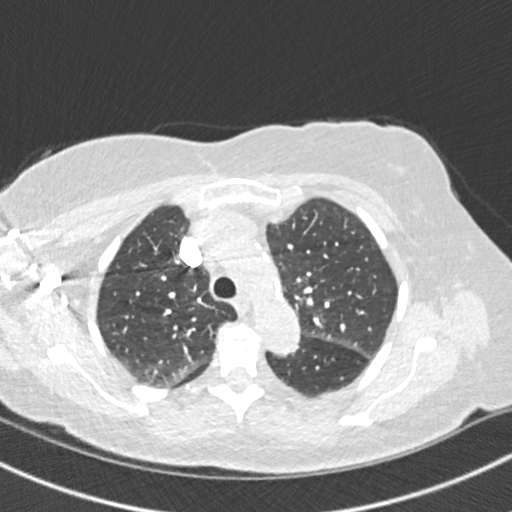
[im 173/223  mediastinal]
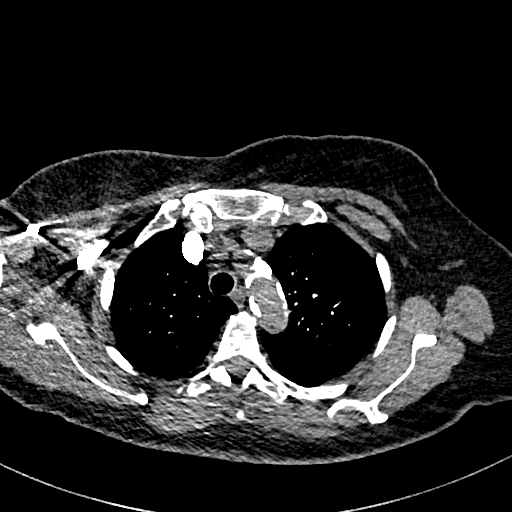
[im 186/223  lung]
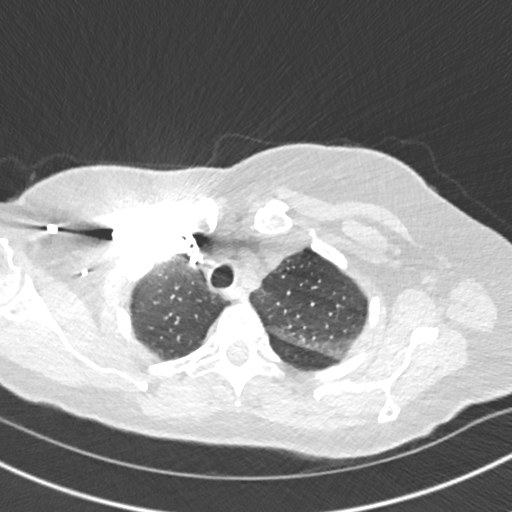
[im 198/223  mediastinal]
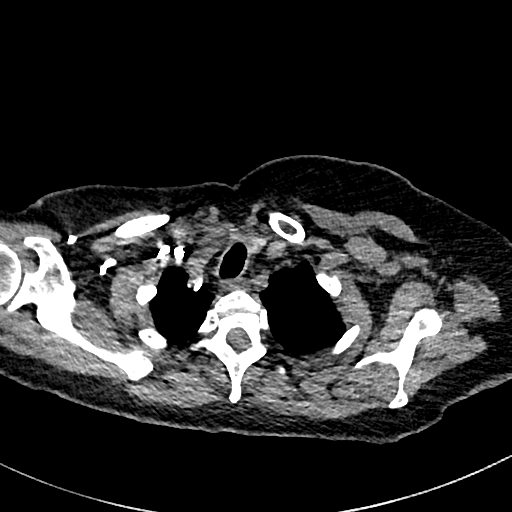
[im 210/223  lung]
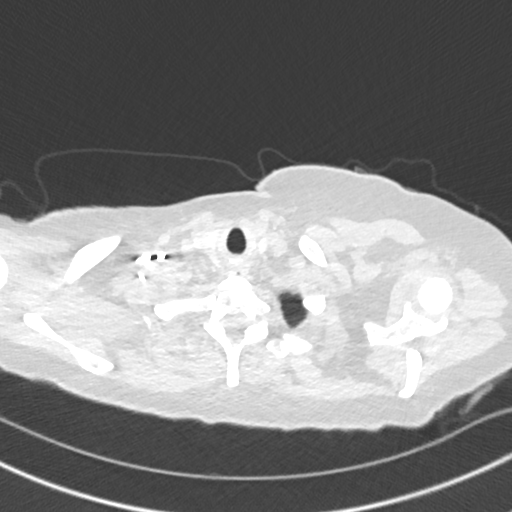

[Series 8: pe 2mm cor · coronal · 0.48mm/px · 1 of 131 slices shown]
[im 66/131  mediastinal]
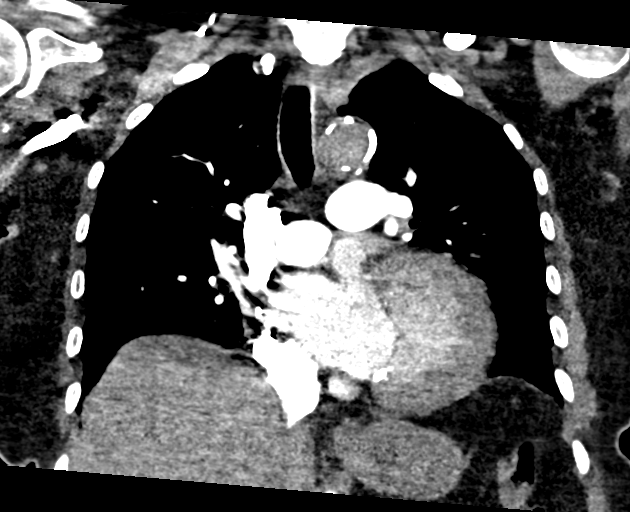

[18 of 36 positions shown; findings below may reference images not displayed]

FINDINGS: Cardiovascular: Atherosclerotic calcifications of thoracic aorta. No
aortic aneurysm. Borderline cardiomegaly. No pericardial effusion.
The study is of excellent technical quality. No pulmonary embolus is
noted.

Mediastinum/Nodes: No mediastinal hematoma or adenopathy. Central
airways are patent. No hilar adenopathy is noted.

Lungs/Pleura: There is mild hyperinflation. No infiltrate or
pulmonary edema. No bronchiectasis.

Axial image 96 series 6 there is a pleural based nodule in right
lower lobe posterior medially measures 8 mm.

Upper Abdomen: The visualized upper abdomen is unremarkable.
Atherosclerotic calcifications of abdominal aorta.

Musculoskeletal: No destructive bony lesions are noted. Degenerative
changes with anterior spurring noted thoracic spine. There is disc
space flattening in mid and lower thoracic spine.

Review of the MIP images confirms the above findings.
IMPRESSION: 1. No pulmonary embolus is noted.
2. Atherosclerotic calcifications of thoracic aorta. Borderline
cardiomegaly.
3. No infiltrate or pulmonary edema. There is 8 mm pleural based
nodule in right lower lobe posterior medially. Non-contrast chest CT
at 6-12 months is recommended. If the nodule is stable at time of
repeat CT, then future CT at 18-24 months (from today's scan) is
considered optional for low-risk patients, but is recommended for
high-risk patients. This recommendation follows the consensus
statement: Guidelines for Management of Incidental Pulmonary Nodules
Detected on CT Images: From the [HOSPITAL] 8606; Radiology
8606; [DATE].
4. No mediastinal hematoma or adenopathy. Degenerative changes
thoracic spine.

## 2018-08-11 ENCOUNTER — Encounter: Payer: Self-pay | Admitting: Cardiology

## 2018-08-11 NOTE — Progress Notes (Signed)
Remote pacemaker transmission.   

## 2018-09-24 ENCOUNTER — Encounter: Payer: Self-pay | Admitting: Family Medicine

## 2018-09-27 MED ORDER — METOPROLOL TARTRATE 25 MG PO TABS: 25.0000 mg | ORAL_TABLET | Freq: Two times a day (BID) | ORAL | 0 refills | Status: DC

## 2018-10-21 DIAGNOSIS — C44519 Basal cell carcinoma of skin of other part of trunk: Secondary | ICD-10-CM | POA: Diagnosis not present

## 2018-11-01 ENCOUNTER — Ambulatory Visit (INDEPENDENT_AMBULATORY_CARE_PROVIDER_SITE_OTHER): Payer: Medicare HMO | Admitting: *Deleted

## 2018-11-01 DIAGNOSIS — I428 Other cardiomyopathies: Secondary | ICD-10-CM

## 2018-11-01 DIAGNOSIS — Z950 Presence of cardiac pacemaker: Secondary | ICD-10-CM

## 2018-11-01 DIAGNOSIS — I442 Atrioventricular block, complete: Secondary | ICD-10-CM | POA: Diagnosis not present

## 2018-11-01 LAB — CUP PACEART REMOTE DEVICE CHECK
Battery Remaining Longevity: 91 mo
Battery Remaining Percentage: 95.5 %
Battery Voltage: 2.99 V
Brady Statistic AP VP Percent: 6.1 %
Brady Statistic AP VS Percent: 1 %
Brady Statistic AS VP Percent: 91 %
Brady Statistic AS VS Percent: 1.4 %
Brady Statistic RA Percent Paced: 4.7 %
Date Time Interrogation Session: 20200817060013
Implantable Lead Implant Date: 20190219
Implantable Lead Implant Date: 20190219
Implantable Lead Implant Date: 20190219
Implantable Lead Location: 753858
Implantable Lead Location: 753859
Implantable Lead Location: 753860
Implantable Pulse Generator Implant Date: 20190219
Lead Channel Impedance Value: 450 Ohm
Lead Channel Impedance Value: 690 Ohm
Lead Channel Impedance Value: 910 Ohm
Lead Channel Pacing Threshold Amplitude: 0.5 V
Lead Channel Pacing Threshold Amplitude: 0.75 V
Lead Channel Pacing Threshold Amplitude: 1.5 V
Lead Channel Pacing Threshold Pulse Width: 0.5 ms
Lead Channel Pacing Threshold Pulse Width: 0.5 ms
Lead Channel Pacing Threshold Pulse Width: 0.8 ms
Lead Channel Sensing Intrinsic Amplitude: 12 mV
Lead Channel Sensing Intrinsic Amplitude: 2.6 mV
Lead Channel Setting Pacing Amplitude: 2 V
Lead Channel Setting Pacing Amplitude: 2.5 V
Lead Channel Setting Pacing Amplitude: 2.5 V
Lead Channel Setting Pacing Pulse Width: 0.5 ms
Lead Channel Setting Pacing Pulse Width: 0.8 ms
Lead Channel Setting Sensing Sensitivity: 4 mV
Pulse Gen Model: 3262
Pulse Gen Serial Number: 8995296

## 2018-11-10 ENCOUNTER — Encounter: Payer: Self-pay | Admitting: Cardiology

## 2018-11-10 NOTE — Progress Notes (Signed)
Remote pacemaker transmission.   

## 2018-11-15 ENCOUNTER — Encounter: Payer: Self-pay | Admitting: Family Medicine

## 2018-11-17 ENCOUNTER — Other Ambulatory Visit: Payer: Self-pay

## 2018-11-17 MED ORDER — ESOMEPRAZOLE MAGNESIUM 40 MG PO CPDR: 40.0000 mg | DELAYED_RELEASE_CAPSULE | Freq: Every day | ORAL | 3 refills | Status: DC

## 2018-11-17 NOTE — Progress Notes (Signed)
Nexuim

## 2018-12-03 ENCOUNTER — Telehealth: Payer: Self-pay | Admitting: Family Medicine

## 2018-12-03 NOTE — Telephone Encounter (Signed)
I left a message asking the patient to call me at [phone] to schedule AWV with Courtney. VDM (Dee-Dee) °

## 2018-12-15 ENCOUNTER — Ambulatory Visit: Payer: Medicare HMO | Admitting: Family Medicine

## 2018-12-16 ENCOUNTER — Ambulatory Visit: Payer: Medicare HMO | Admitting: Family Medicine

## 2018-12-17 ENCOUNTER — Ambulatory Visit (INDEPENDENT_AMBULATORY_CARE_PROVIDER_SITE_OTHER): Payer: Medicare HMO | Admitting: Family Medicine

## 2018-12-17 ENCOUNTER — Other Ambulatory Visit: Payer: Self-pay

## 2018-12-17 ENCOUNTER — Encounter: Payer: Self-pay | Admitting: Family Medicine

## 2018-12-17 VITALS — BP 132/62 | HR 84 | Temp 97.7°F | Resp 16 | Ht 61.0 in | Wt 151.2 lb

## 2018-12-17 DIAGNOSIS — R252 Cramp and spasm: Secondary | ICD-10-CM

## 2018-12-17 DIAGNOSIS — Z23 Encounter for immunization: Secondary | ICD-10-CM

## 2018-12-17 DIAGNOSIS — K219 Gastro-esophageal reflux disease without esophagitis: Secondary | ICD-10-CM | POA: Diagnosis not present

## 2018-12-17 MED ORDER — OMEPRAZOLE 20 MG PO CPDR: 20.0000 mg | DELAYED_RELEASE_CAPSULE | Freq: Every day | ORAL | 3 refills | Status: DC

## 2018-12-17 NOTE — Patient Instructions (Addendum)
Please return in November for your annual complete physical; please come fasting. Medicare recommends an Annual Wellness Visit for all patients. Please schedule this to be done with our Nurse Educator, Loma Sousa. This is an informative "talk" visit; it's goals are to ensure that your health care needs are being met and to give you education regarding avoiding falls, ensuring you are not suffering from depression or problems with memory or thinking, and to educate you on Advance Care Planning. It helps me take good care of you!  Today you were given your flu vaccination.  You have had both of your pneumonia vaccinations so are not due for anymore.  I've ordered prilosec to start for your heartburn.   If you have any questions or concerns, please don't hesitate to send me a message via MyChart or call the office at [phone]. Thank you for visiting with [place] today! It's our pleasure caring for you.   Leg Cramps Leg cramps occur when one or more muscles tighten and you have no control over this tightening (involuntary muscle contraction). Muscle cramps can develop in any muscle, but the most common place is in the calf muscles of the leg. Those cramps can occur during exercise or when you are at rest. Leg cramps are painful, and they may last for a few seconds to a few minutes. Cramps may return several times before they finally stop. Usually, leg cramps are not caused by a serious medical problem. In many cases, the cause is not known. Some common causes include:  Excessive physical effort (overexertion), such as during intense exercise.  Overuse from repetitive motions, or doing the same thing over and over.  Staying in a certain position for a long period of time.  Improper preparation, form, or technique while performing a sport or an activity.  Dehydration.  Injury.  Side effects of certain medicines.  Abnormally low levels of minerals in your blood (electrolytes), especially potassium  and calcium. This could result from: ? Pregnancy. ? Taking diuretic medicines. Follow these instructions at home: Eating and drinking  Drink enough fluid to keep your urine pale yellow. Staying hydrated may help prevent cramps.  Eat a healthy diet that includes plenty of nutrients to help your muscles function. A healthy diet includes fruits and vegetables, lean protein, whole grains, and low-fat or nonfat dairy products. Managing pain, stiffness, and swelling      Try massaging, stretching, and relaxing the affected muscle. Do this for several minutes at a time.  If directed, put ice on areas that are sore or painful after a cramp: ? Put ice in a plastic bag. ? Place a towel between your skin and the bag. ? Leave the ice on for 20 minutes, 2-3 times a day.  If directed, apply heat to muscles that are tense or tight. Do this before you exercise, or as often as told by your health care provider. Use the heat source that your health care provider recommends, such as a moist heat pack or a heating pad. ? Place a towel between your skin and the heat source. ? Leave the heat on for 20-30 minutes. ? Remove the heat if your skin turns bright red. This is especially important if you are unable to feel pain, heat, or cold. You may have a greater risk of getting burned.  Try taking hot showers or baths to help relax tight muscles. General instructions  If you are having frequent leg cramps, avoid intense exercise for several days.  Take  over-the-counter and prescription medicines only as told by your health care provider.  Keep all follow-up visits as told by your health care provider. This is important. Contact a health care provider if:  Your leg cramps get more severe or more frequent, or they do not improve over time.  Your foot becomes cold, numb, or blue. Summary  Muscle cramps can develop in any muscle, but the most common place is in the calf muscles of the leg.  Leg cramps  are painful, and they may last for a few seconds to a few minutes.  Usually, leg cramps are not caused by a serious medical problem. Often, the cause is not known.  Stay hydrated and take over-the-counter and prescription medicines only as told by your health care provider. This information is not intended to replace advice given to you by your health care provider. Make sure you discuss any questions you have with your health care provider. Document Released: 04/10/2004 Document Revised: 02/13/2017 Document Reviewed: 12/11/2016 Elsevier Patient Education  2020 [employer].

## 2018-12-17 NOTE — Progress Notes (Signed)
Subjective  CC:  Chief Complaint  Patient presents with  . Leg Cramping    Comes/goes, mostly at night  . Feet Discoloration    Family thinks feet look grey   Same day acute visit; PCP not available. New pt to me. Chart reviewed.   HPI: Madison White is a 85 y.o. female who presents to the office today to address the problems listed above in the chief complaint.  85 yo c/o intermittent calf cramps at night. Painful. No problems during the day. No pattern or known triggers. No RLS. No LE edema. No joint pain. Eats and drinks well. Feels well otherwise. Family was concerned because her feet "looked grey".   GERD sxs: PCP ordered nexium but it is too expensive. Described gerd/heartburn sxs daily w/o change in appetite, melena, hematemesis or pain.    Assessment  1. Leg cramps   2. Gastroesophageal reflux disease, unspecified whether esophagitis present   3. Need for immunization against influenza      Plan   Leg cramps:  No red flag features. Educated. Start stretches (demonstrated), massage, keep hydrated and may use prn tonic water. See AVS.   GERD: changed to generic omeprazole. F/u if not improving  HD flu shot today.   Follow up: nov for cpe; needs AWV   Visit date not found  Orders Placed This Encounter  Procedures  . Flu Vaccine QUAD High Dose(Fluad)   Meds ordered this encounter  Medications  . omeprazole (PRILOSEC) 20 MG capsule    Sig: Take 1 capsule (20 mg total) by mouth daily.    Dispense:  30 capsule    Refill:  3      I reviewed the patients updated PMH, FH, and SocHx.    Patient Active Problem List   Diagnosis Date Noted  . Acute lower GI bleeding   . Hematochezia   . GI bleed 08/31/2017  . Complete heart block ([place]) 05/05/2017  . Renal cyst 03/01/2017  . Hydronephrosis with urinary obstruction due to renal calculus 03/01/2017  . Total knee replacement status, bilateral 11/06/2016  . Nonischemic cardiomyopathy ([place]) 11/17/2014  . Upper  airway cough syndrome 02/26/2014  . Constipation, chronic 02/26/2014  . GERD (gastroesophageal reflux disease) 11/19/2012  . Dyslipidemia 09/02/2012  . Left bundle branch block 12/18/2011  . HTN (hypertension), benign 05/31/2011   Current Meds  Medication Sig  . ELIQUIS 5 MG TABS tablet TAKE 1 TABLET BY MOUTH TWICE A DAY  . metoprolol tartrate (LOPRESSOR) 25 MG tablet Take 1 tablet (25 mg total) by mouth 2 (two) times daily.    Allergies: Patient is allergic to penicillins; nickel; avapro [irbesartan]; lisinopril; simvastatin; latex; sulfa antibiotics; and sulfamethoxazole. Family History: Patient family history includes Cancer in her father; Diabetes in her mother; Heart disease in her father and mother; Hyperlipidemia in her mother; Hypertension in her mother. Social History:  Patient  reports that she quit smoking about 60 years ago. She has never used smokeless tobacco. She reports current alcohol use of about 1.0 standard drinks of alcohol per week. She reports that she does not use drugs.  Review of Systems: Constitutional: Negative for fever malaise or anorexia Cardiovascular: negative for chest pain Respiratory: negative for SOB or persistent cough Gastrointestinal: negative for abdominal pain  Objective  Vitals: BP 132/62   Pulse 84   Temp 97.7 F (36.5 C) (Tympanic)   Resp 16   Ht 5\' 1"  (1.549 m)   Wt 151 lb 3.2 oz (68.6 kg)  SpO2 94%   BMI 28.57 kg/m  General: no acute distress , A&Ox3 HEENT: PEERL, conjunctiva normal, Oropharynx moist,neck is supple Cardiovascular:  RRR without murmur or gallop.  Respiratory:  Good breath sounds bilaterally, CTAB with normal respiratory effort Skin:  Warm, no rashes nontender abdominal exam. EXT: nontender calves w/o cords. No edema. +2 distal pulses. Varicosities present. Warm feet w/o discoloration or rash.    Commons side effects, risks, benefits, and alternatives for medications and treatment plan prescribed today were  discussed, and the patient expressed understanding of the given instructions. Patient is instructed to call or message via MyChart if he/she has any questions or concerns regarding our treatment plan. No barriers to understanding were identified. We discussed Red Flag symptoms and signs in detail. Patient expressed understanding regarding what to do in case of urgent or emergency type symptoms.   Medication list was reconciled, printed and provided to the patient in AVS. Patient instructions and summary information was reviewed with the patient as documented in the AVS. This note was prepared with assistance of Dragon voice recognition software. Occasional wrong-word or sound-a-like substitutions may have occurred due to the inherent limitations of voice recognition software

## 2018-12-19 ENCOUNTER — Other Ambulatory Visit: Payer: Self-pay | Admitting: Family Medicine

## 2018-12-21 NOTE — Telephone Encounter (Signed)
Last fill  09/27/18  #180/0 Last OV 12/17/18  W/Andy

## 2019-01-18 ENCOUNTER — Other Ambulatory Visit: Payer: Self-pay | Admitting: Cardiovascular Disease

## 2019-01-18 NOTE — Telephone Encounter (Signed)
89 F 68.6 kg, SCr 0.94 (1/20), LOV 10/19 [place]

## 2019-01-24 ENCOUNTER — Ambulatory Visit (INDEPENDENT_AMBULATORY_CARE_PROVIDER_SITE_OTHER): Payer: Medicare HMO

## 2019-01-24 ENCOUNTER — Other Ambulatory Visit: Payer: Self-pay

## 2019-01-24 VITALS — BP 126/72 | Temp 97.6°F | Ht 61.0 in | Wt 149.0 lb

## 2019-01-24 DIAGNOSIS — L821 Other seborrheic keratosis: Secondary | ICD-10-CM | POA: Diagnosis not present

## 2019-01-24 DIAGNOSIS — D229 Melanocytic nevi, unspecified: Secondary | ICD-10-CM | POA: Diagnosis not present

## 2019-01-24 DIAGNOSIS — Z0000 Encounter for general adult medical examination without abnormal findings: Secondary | ICD-10-CM | POA: Diagnosis not present

## 2019-01-24 NOTE — Progress Notes (Signed)
Subjective:   Madison White is a 85 y.o. female who presents for Medicare Annual (Subsequent) preventive examination.  Review of Systems:   Cardiac Risk Factors include: advanced age (>87men, >87 women);hypertension    Objective:     Vitals: BP 126/72   Temp 97.6 F (36.4 C)   Ht 5\' 1"  (1.549 m)   Wt 149 lb (67.6 kg)   BMI 28.15 kg/m   Body mass index is 28.15 kg/m.  Advanced Directives 01/24/2019 11/21/2017 09/01/2017 05/05/2017 01/14/2017 01/07/2016  Does Patient Have a Medical Advance Directive? Yes Yes No Yes Yes Yes  Type of Paramedic of [place];Living will - - Living will;Healthcare Power of [place];Living will [place];Living will  Does patient want to make changes to medical advance directive? No - Patient declined - - No - Patient declined No - Patient declined No - Patient declined  Copy of [place] in Chart? No - copy requested - - No - copy requested No - copy requested No - copy requested  Would patient like information on creating a medical advance directive? - - No - Patient declined - - -    Tobacco Social History   Tobacco Use  Smoking Status Former Smoker  . Quit date: 05/31/1958  . Years since quitting: 60.6  Smokeless Tobacco Never Used     Counseling given: Not Answered   Clinical Intake:  Pre-visit preparation completed: Yes  Pain : No/denies pain  Diabetes: No  How often do you need to have someone help you when you read instructions, pamphlets, or other written materials from your doctor or pharmacy?: 3 - Sometimes  Interpreter Needed?: No  Comments: accompained by caretaker Information entered by :: Denman George LPN  Past Medical History:  Diagnosis Date  . Diastolic dysfunction, left ventricle   . GERD (gastroesophageal reflux disease)   . 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  [name] History of blood transfusion   . History of rheumatic fever   . HTN (hypertension)    hx of refusing treatment--White coat HTN and/or situational HTN (?)   . Hyperlipidemia    hx of refusing treatment  . Insulin resistance    A1c's excellent per old records (6.1 in 2008 and 2009)  . Left bundle branch block 12/2011   Dr. Sallyanne Kuster at [facility]; ECHO and myocardial perfusion scan showed  septal and apical wall motion abnormality but she had no significant valvular disease and no ischemia.  She did have mildly decreased EF (39% on lexiscan and 50% on echo) and abnl LV relaxation.  Mild amount of PVCs.  Pt at higher risk for other conduction abnormalities, good chance of eventually requiring a pacemaker.   [name] LVH (left ventricular hypertrophy)   . Nonischemic cardiomyopathy ([place]) 2016   LV dysfunction due to LBBB/septal dyssynchrony  . Osteoarthritis    bilat knees--needs bilat TKA.  Ortho is trying steroid injections as of 10/23/15.  . Presence of permanent cardiac pacemaker 05/05/2017  . Seasonal allergies   . Solitary pulmonary nodule 05/2016   8 mm pleural based nodule in RLL.  Radiology recommended f/u noncontrast CT in 6-12 mo.   Past Surgical History:  Procedure Laterality Date  .  ABDOMINAL HYSTERECTOMY  1963   At the time of her last C/S; says she had partial bladder resection at that time as well  . BI-VENTRICULAR PACEMAKER INSERTION (CRT-P)  05/05/2017  . BIV PACEMAKER INSERTION CRT-P N/A 05/05/2017   Procedure: BIV PACEMAKER INSERTION CRT-P;  Surgeon: Evans Lance, MD;  Location: [facility];  Service: Cardiovascular;  Laterality: N/A;  . CARDIOVASCULAR STRESS TEST  08/2005 & 12/2011   Low risk scans (on the 12/2011 scan she did have EF 39% with moderately severe LV dysfunction with septal  dyssynergy probably contributed by LBBB  . Carotid dopplers  11/22/14; 11/30/16   NORMAL 2016 and 2018  . CATARACT EXTRACTION  08/14/11   both  . CESAREAN SECTION  X 5   One of her neonates died soon after birth  . CHOLECYSTECTOMY     1980's  . COLONOSCOPY WITH PROPOFOL N/A 09/02/2017   Procedure: COLONOSCOPY WITH PROPOFOL;  Surgeon: Ladene Artist, MD;  Location: [facility] ENDOSCOPY;  Service: Endoscopy;  Laterality: N/A;  . HOT HEMOSTASIS N/A 09/02/2017   Procedure: HOT HEMOSTASIS (ARGON PLASMA COAGULATION/BICAP);  Surgeon: Ladene Artist, MD;  Location: [facility] ENDOSCOPY;  Service: Endoscopy;  Laterality: N/A;  . INSERT / REPLACE / REMOVE PACEMAKER    . JOINT REPLACEMENT Bilateral    knee  . right knee surgery Right 05/2016   ? R TKA: no records.  [name] STRABISMUS SURGERY  1939  . TONSILLECTOMY  1938  . TRANSTHORACIC ECHOCARDIOGRAM  01/13/12   Septal dyssynergy, EF 50%, LV relaxation impaired.  No significant valvular abnormalities.   Family History  Problem Relation Age of Onset  . Hypertension Mother   . Heart disease Mother   . Hyperlipidemia Mother   . Diabetes Mother   . Heart disease Father   . Cancer Father    Social History   Socioeconomic History  . Marital status: Widowed    Spouse name: Not on file  . Number of children: Not on file  . Years of education: Not on file  . Highest education level: Not on file  Occupational History  . Not on file  Social Needs  . Financial resource strain: Not on file  . Food insecurity    Worry: Not on file    Inability: Not on file  . Transportation needs    Medical: Not on file    Non-medical: Not on file  Tobacco Use  . Smoking status: Former Smoker    Quit date: 05/31/1958    Years since quitting: 60.6  . Smokeless tobacco: Never Used  Substance and Sexual Activity  . Alcohol use: Yes    Alcohol/week: 1.0 standard drinks    Types: 1 Glasses of wine per week  . Drug use: No  . Sexual activity: Not on file  Lifestyle  .  Physical activity    Days per week: Not on file    Minutes per session: Not on file  . Stress: Not on file  Relationships  . Social Herbalist on phone: Not on file    Gets together: Not on file    Attends religious service: Not on file    Active member of club or organization: Not on file    Attends meetings of clubs or organizations: Not on file    Relationship status: Not on file  Other Topics Concern  . Not on file  Social History Narrative   Widower, husband died around 44 (wrongful death per pt's report).  Originally from [place], has been in [place] since about 1997.   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   Distant hx of tobacco abuse.  No alcohol or drugs.    Outpatient Encounter Medications as of 01/24/2019  Medication Sig  . apixaban (ELIQUIS) 5 MG TABS tablet Take 1 tablet by mouth twice daily, please schedule MD appt for further refills  . metoprolol tartrate (LOPRESSOR) 25 MG tablet TAKE 1 TABLET BY MOUTH TWICE A DAY  . omeprazole (PRILOSEC) 20 MG capsule Take 1 capsule (20 mg total) by mouth daily.   No facility-administered encounter medications on file as of 01/24/2019.     Activities of Daily Living In your present state of health, do you have any difficulty performing the following activities: 01/24/2019  Hearing? N  Vision? N  Difficulty concentrating or making decisions? Y  Comment increased forgetfulness  Walking or climbing stairs? Y  Comment unsteady gait  Dressing or bathing? N  Doing errands, shopping? Y  Preparing Food and eating ? Y  Comment preparation  Using the Toilet? N  In the past six months, have you accidently leaked urine? N  Do you have problems with loss of bowel control? N  Managing your Medications? Y  Managing your Finances? Y  Comment handled by daughter  Housekeeping or managing your Housekeeping? Y  Some recent data might be hidden    Patient Care Team:  [name], DO as PCP - General (Family Medicine) Croitoru, Dani Gobble, MD as Consulting Physician (Cardiology) Pieter Partridge, DO as Consulting Physician (Neurology) Ninetta Lights, MD (Inactive) as Consulting Physician (Orthopedic Surgery) Gaynelle Arabian, MD as Consulting Physician (Orthopedic Surgery) Crista Luria, MD as Consulting Physician (Dermatology) Felton [facility]) Nolon Lennert (Dentistry)    Assessment:   This is a routine wellness examination for Madison White.  Exercise Activities and Dietary recommendations Current Exercise Habits: The patient does not participate in regular exercise at present  Goals    . <enter goal here> (pt-stated)     "get back in to motion after knee surgery" (in January). Anticipates getting back [employer] for water exercise.        Fall Risk Fall Risk  01/24/2019 03/23/2018 12/08/2017 01/14/2017 01/07/2016  Falls in the past year? 0 1 Yes No No  Number falls in past yr: - 1 - - -  Injury with Fall? 0 1 - - -  Risk Factor Category  - - High Fall Risk - -  Risk for fall due to : Impaired balance/gait - - - -  Follow up Falls evaluation completed;Education provided;Falls prevention discussed - - - -   Is the patient's home free of loose throw rugs in walkways, pet beds, electrical cords, etc?   yes      Grab bars in the bathroom? yes      Handrails on the stairs?   yes      Adequate lighting?   yes  Timed Get Up and Go performed: completed and within normal timeframe; some unsteadiness noted   Depression Screen PHQ 2/9 Scores 01/24/2019 12/08/2017 01/14/2017 01/07/2016  PHQ - 2 Score 0 0 0 0  PHQ- 9 Score - 0 - -     Cognitive Function MMSE - Mini Mental State Exam 01/24/2019 09/07/2017 01/14/2017 01/07/2016  Orientation to time 5 5 4 4   Orientation to Place 5 4 5 5   Registration 3 3 3 3   Attention/ Calculation 4 5 5 5   Recall 1 3  2 3  Language- name 2 objects 2 2 2 2   Language- repeat 1 1 1 1   Language- follow 3 step command 3 3 3 3   Language- read &  follow direction 1 1 1 1   Write a sentence 1 1 1 1   Copy design 1 1 1 1   Total score 27 29 28 29         Immunization History  Administered Date(s) Administered  . Fluad Quad(high Dose 65+) 12/17/2018  . Influenza, High Dose Seasonal PF 12/28/2014, 01/14/2017, 12/08/2017  . Influenza,inj,Quad PF,6+ Mos 02/20/2014  . Influenza,inj,Quad PF,6-35 Mos 11/19/2012  . Influenza-Unspecified 12/26/2015  . Pneumococcal Conjugate-13 12/28/2014  . Pneumococcal Polysaccharide-23 05/20/2006  . Tdap 09/08/2011    Qualifies for Shingles Vaccine?Discussed and patient will check with pharmacy for coverage.  Patient education handout provided    Screening Tests Health Maintenance  Topic Date Due  . DEXA SCAN  12/11/1995  . TETANUS/TDAP  09/07/2021  . INFLUENZA VACCINE  Completed  . PNA vac Low Risk Adult  Completed    Cancer Screenings: Lung: Low Dose CT Chest recommended if Age 30-80 years, 30 pack-year currently smoking OR have quit w/in 15years. Patient does not qualify. Breast:  Up to date on Mammogram? Yes   Up to date of Bone Density/Dexa? Yes Colorectal: Last colonoscopy 09/02/17 with Dr Fuller Plan; no longer indicated     Plan:  I have personally reviewed and addressed the Medicare Annual Wellness questionnaire and have noted the following in the patient's chart:  A. Medical and social history B. Use of alcohol, tobacco or illicit drugs  C. Current medications and supplements D. Functional ability and status E.  Nutritional status F.  Physical activity G. Advance directives H. List of other physicians I.  Hospitalizations, surgeries, and ER visits in previous 12 months J.  [place] such as hearing and vision if needed, cognitive and depression L. Referrals, records requested, and appointments- none   In addition, I have reviewed and discussed with patient certain preventive protocols, quality metrics, and best practice recommendations. A written personalized care plan  for preventive services as well as general preventive health recommendations were provided to patient.   Signed,  Denman George, LPN  Nurse Health Advisor   Nurse Notes: Patient with concerns with cost of Eliquis; medication prescribed by Dr. Sallyanne Kuster.  Provided patient and caretaker with manufacturer assistance forms.

## 2019-01-24 NOTE — Patient Instructions (Signed)
Madison White , Thank you for taking time to come for your Medicare Wellness Visit. I appreciate your ongoing commitment to your health goals. Please review the following plan we discussed and let me know if I can assist you in the future.   Screening recommendations/referrals: Colorectal Screening: up to date; colonoscopy last 09/02/17 Mammogram: up to date Bone Density: No longer indicated   Vision and Dental Exams: Recommended annual ophthalmology exams for early detection of glaucoma and other disorders of the eye Recommended annual dental exams for proper oral hygiene  Vaccinations: Influenza vaccine: completed 12/17/18 Pneumococcal vaccine: up to date; last 12/28/14 Tdap vaccine: up to date; last 09/08/11  Shingles vaccine: Please call your insurance company to determine your out of pocket expense for the Shingrix vaccine. You may receive this vaccine at your local pharmacy.  Advanced directives: Please bring a copy of your POA (Power of Attorney) and/or Living Will to your next appointment.  Goals: Recommend to drink at least 6-8 8oz glasses of water per day and consume a balanced diet rich in fresh fruits and vegetables.   Next appointment: Please schedule your Annual Wellness Visit with your Nurse Health Advisor in one year.  Preventive Care 11 Years and Older, Female Preventive care refers to lifestyle choices and visits with your health care provider that can promote health and wellness. What does preventive care include?  A yearly physical exam. This is also called an annual well check.  Dental exams once or twice a year.  Routine eye exams. Ask your health care provider how often you should have your eyes checked.  Personal lifestyle choices, including:  Daily care of your teeth and gums.  Regular physical activity.  Eating a healthy diet.  Avoiding tobacco and drug use.  Limiting alcohol use.  Practicing safe sex.  Taking low-dose aspirin every day if  recommended by your health care provider.  Taking vitamin and mineral supplements as recommended by your health care provider. What happens during an annual well check? The services and screenings done by your health care provider during your annual well check will depend on your age, overall health, lifestyle risk factors, and family history of disease. Counseling  Your health care provider may ask you questions about your:  Alcohol use.  Tobacco use.  Drug use.  Emotional well-being.  Home and relationship well-being.  Sexual activity.  Eating habits.  History of falls.  Memory and ability to understand (cognition).  Work and work Statistician.  Reproductive health. Screening  You may have the following tests or measurements:  Height, weight, and BMI.  Blood pressure.  Lipid and cholesterol levels. These may be checked every 5 years, or more frequently if you are over 8 years old.  Skin check.  Lung cancer screening. You may have this screening every year starting at age 32 if you have a 30-pack-year history of smoking and currently smoke or have quit within the past 15 years.  Fecal occult blood test (FOBT) of the stool. You may have this test every year starting at age 63.  Flexible sigmoidoscopy or colonoscopy. You may have a sigmoidoscopy every 5 years or a colonoscopy every 10 years starting at age 56.  Hepatitis C blood test.  Hepatitis B blood test.  Sexually transmitted disease (STD) testing.  Diabetes screening. This is done by checking your blood sugar (glucose) after you have not eaten for a while (fasting). You may have this done every 1-3 years.  Bone density scan. This is done  to screen for osteoporosis. You may have this done starting at age 52.  Mammogram. This may be done every 1-2 years. Talk to your health care provider about how often you should have regular mammograms. Talk with your health care provider about your test results,  treatment options, and if necessary, the need for more tests. Vaccines  Your health care provider may recommend certain vaccines, such as:  Influenza vaccine. This is recommended every year.  Tetanus, diphtheria, and acellular pertussis (Tdap, Td) vaccine. You may need a Td booster every 10 years.  Zoster vaccine. You may need this after age 60.  Pneumococcal 13-valent conjugate (PCV13) vaccine. One dose is recommended after age 44.  Pneumococcal polysaccharide (PPSV23) vaccine. One dose is recommended after age 53. Talk to your health care provider about which screenings and vaccines you need and how often you need them. This information is not intended to replace advice given to you by your health care provider. Make sure you discuss any questions you have with your health care provider. Document Released: 03/30/2015 Document Revised: 11/21/2015 Document Reviewed: 01/02/2015 Elsevier Interactive Patient Education  2017 [employer] Prevention in the Home Falls can cause injuries. They can happen to people of all ages. There are many things you can do to make your home safe and to help prevent falls. What can I do on the outside of my home?  Regularly fix the edges of walkways and driveways and fix any cracks.  Remove anything that might make you trip as you walk through a door, such as a raised step or threshold.  Trim any bushes or trees on the path to your home.  Use bright outdoor lighting.  Clear any walking paths of anything that might make someone trip, such as rocks or tools.  Regularly check to see if handrails are loose or broken. Make sure that both sides of any steps have handrails.  Any raised decks and porches should have guardrails on the edges.  Have any leaves, snow, or ice cleared regularly.  Use sand or salt on walking paths during winter.  Clean up any spills in your garage right away. This includes oil or grease spills. What can I do in the  bathroom?  Use night lights.  Install grab bars by the toilet and in the tub and shower. Do not use towel bars as grab bars.  Use non-skid mats or decals in the tub or shower.  If you need to sit down in the shower, use a plastic, non-slip stool.  Keep the floor dry. Clean up any water that spills on the floor as soon as it happens.  Remove soap buildup in the tub or shower regularly.  Attach bath mats securely with double-sided non-slip rug tape.  Do not have throw rugs and other things on the floor that can make you trip. What can I do in the bedroom?  Use night lights.  Make sure that you have a light by your bed that is easy to reach.  Do not use any sheets or blankets that are too big for your bed. They should not hang down onto the floor.  Have a firm chair that has side arms. You can use this for support while you get dressed.  Do not have throw rugs and other things on the floor that can make you trip. What can I do in the kitchen?  Clean up any spills right away.  Avoid walking on wet floors.  Keep items  that you use a lot in easy-to-reach places.  If you need to reach something above you, use a strong step stool that has a grab bar.  Keep electrical cords out of the way.  Do not use floor polish or wax that makes floors slippery. If you must use wax, use non-skid floor wax.  Do not have throw rugs and other things on the floor that can make you trip. What can I do with my stairs?  Do not leave any items on the stairs.  Make sure that there are handrails on both sides of the stairs and use them. Fix handrails that are broken or loose. Make sure that handrails are as long as the stairways.  Check any carpeting to make sure that it is firmly attached to the stairs. Fix any carpet that is loose or worn.  Avoid having throw rugs at the top or bottom of the stairs. If you do have throw rugs, attach them to the floor with carpet tape.  Make sure that you have a  light switch at the top of the stairs and the bottom of the stairs. If you do not have them, ask someone to add them for you. What else can I do to help prevent falls?  Wear shoes that:  Do not have high heels.  Have rubber bottoms.  Are comfortable and fit you well.  Are closed at the toe. Do not wear sandals.  If you use a stepladder:  Make sure that it is fully opened. Do not climb a closed stepladder.  Make sure that both sides of the stepladder are locked into place.  Ask someone to hold it for you, if possible.  Clearly mark and make sure that you can see:  Any grab bars or handrails.  First and last steps.  Where the edge of each step is.  Use tools that help you move around (mobility aids) if they are needed. These include:  Canes.  Walkers.  Scooters.  Crutches.  Turn on the lights when you go into a dark area. Replace any light bulbs as soon as they burn out.  Set up your furniture so you have a clear path. Avoid moving your furniture around.  If any of your floors are uneven, fix them.  If there are any pets around you, be aware of where they are.  Review your medicines with your doctor. Some medicines can make you feel dizzy. This can increase your chance of falling. Ask your doctor what other things that you can do to help prevent falls. This information is not intended to replace advice given to you by your health care provider. Make sure you discuss any questions you have with your health care provider. Document Released: 12/28/2008 Document Revised: 08/09/2015 Document Reviewed: 04/07/2014 Elsevier Interactive Patient Education  2017 [employer].

## 2019-01-24 NOTE — Progress Notes (Signed)
I have reviewed the documentation from the recent AWV done by Courtney Slade, RN; I agree with the documentation and will follow up on any recommendations or abnormal findings as suggested.  

## 2019-01-31 ENCOUNTER — Ambulatory Visit (INDEPENDENT_AMBULATORY_CARE_PROVIDER_SITE_OTHER): Payer: Medicare HMO | Admitting: *Deleted

## 2019-01-31 DIAGNOSIS — I447 Left bundle-branch block, unspecified: Secondary | ICD-10-CM

## 2019-01-31 DIAGNOSIS — I442 Atrioventricular block, complete: Secondary | ICD-10-CM | POA: Diagnosis not present

## 2019-02-01 LAB — CUP PACEART REMOTE DEVICE CHECK
Battery Remaining Longevity: 90 mo
Battery Remaining Percentage: 95.5 %
Battery Voltage: 2.99 V
Brady Statistic AP VP Percent: 6.5 %
Brady Statistic AP VS Percent: 1 %
Brady Statistic AS VP Percent: 91 %
Brady Statistic AS VS Percent: 1.2 %
Brady Statistic RA Percent Paced: 5.1 %
Date Time Interrogation Session: 20201116070014
Implantable Lead Implant Date: 20190219
Implantable Lead Implant Date: 20190219
Implantable Lead Implant Date: 20190219
Implantable Lead Location: 753858
Implantable Lead Location: 753859
Implantable Lead Location: 753860
Implantable Pulse Generator Implant Date: 20190219
Lead Channel Impedance Value: 460 Ohm
Lead Channel Impedance Value: 640 Ohm
Lead Channel Impedance Value: 850 Ohm
Lead Channel Pacing Threshold Amplitude: 0.5 V
Lead Channel Pacing Threshold Amplitude: 0.75 V
Lead Channel Pacing Threshold Amplitude: 1.5 V
Lead Channel Pacing Threshold Pulse Width: 0.5 ms
Lead Channel Pacing Threshold Pulse Width: 0.5 ms
Lead Channel Pacing Threshold Pulse Width: 0.8 ms
Lead Channel Sensing Intrinsic Amplitude: 12 mV
Lead Channel Sensing Intrinsic Amplitude: 2 mV
Lead Channel Setting Pacing Amplitude: 2 V
Lead Channel Setting Pacing Amplitude: 2.5 V
Lead Channel Setting Pacing Amplitude: 2.5 V
Lead Channel Setting Pacing Pulse Width: 0.5 ms
Lead Channel Setting Pacing Pulse Width: 0.8 ms
Lead Channel Setting Sensing Sensitivity: 4 mV
Pulse Gen Model: 3262
Pulse Gen Serial Number: 8995296

## 2019-02-25 NOTE — Progress Notes (Signed)
Remote pacemaker transmission.   

## 2019-03-23 ENCOUNTER — Other Ambulatory Visit: Payer: Self-pay | Admitting: Family Medicine

## 2019-03-24 ENCOUNTER — Other Ambulatory Visit: Payer: Self-pay

## 2019-03-24 MED ORDER — METOPROLOL TARTRATE 25 MG PO TABS: 25.0000 mg | ORAL_TABLET | Freq: Two times a day (BID) | ORAL | 0 refills | Status: DC

## 2019-04-05 ENCOUNTER — Ambulatory Visit (INDEPENDENT_AMBULATORY_CARE_PROVIDER_SITE_OTHER): Payer: Medicare HMO | Admitting: Family Medicine

## 2019-04-05 ENCOUNTER — Other Ambulatory Visit: Payer: Self-pay

## 2019-04-05 ENCOUNTER — Encounter: Payer: Self-pay | Admitting: Family Medicine

## 2019-04-05 VITALS — BP 146/88 | HR 61 | Temp 97.7°F | Ht 61.0 in | Wt 151.8 lb

## 2019-04-05 DIAGNOSIS — E2839 Other primary ovarian failure: Secondary | ICD-10-CM

## 2019-04-05 DIAGNOSIS — L853 Xerosis cutis: Secondary | ICD-10-CM

## 2019-04-05 DIAGNOSIS — M7581 Other shoulder lesions, right shoulder: Secondary | ICD-10-CM

## 2019-04-05 DIAGNOSIS — Z8679 Personal history of other diseases of the circulatory system: Secondary | ICD-10-CM | POA: Diagnosis not present

## 2019-04-05 DIAGNOSIS — I428 Other cardiomyopathies: Secondary | ICD-10-CM | POA: Diagnosis not present

## 2019-04-05 DIAGNOSIS — Z950 Presence of cardiac pacemaker: Secondary | ICD-10-CM

## 2019-04-05 DIAGNOSIS — I10 Essential (primary) hypertension: Secondary | ICD-10-CM

## 2019-04-05 HISTORY — DX: Presence of cardiac pacemaker: Z95.0

## 2019-04-05 NOTE — Patient Instructions (Signed)
Please return in 3 weeks for shoulder reevaluation.   I will release your lab results to you on your MyChart account with further instructions. Please reply with any questions.    If you have any questions or concerns, please don't hesitate to send me a message via MyChart or call the office at [phone]. Thank you for visiting with Madison White today! It's our pleasure caring for you.   Shoulder Range of Motion Exercises Shoulder range of motion (ROM) exercises are done to keep the shoulder moving freely or to increase movement. They are often recommended for people who have shoulder pain or stiffness or who are recovering from a shoulder surgery. Phase 1 exercises When you are able, do this exercise 1-2 times per day for 30-60 seconds in each direction, or as directed by your health care provider. Pendulum exercise To do this exercise while sitting: 1. Sit in a chair or at the edge of your bed with your feet flat on the floor. 2. Let your affected arm hang down in front of you over the edge of the bed or chair. 3. Relax your shoulder, arm, and hand. [place] your body so your arm gently swings in small circles. You can also use your unaffected arm to start the motion. 5. Repeat changing the direction of the circles, swinging your arm left and right, and swinging your arm forward and back. To do this exercise while standing: 1. Stand next to a sturdy chair or table, and hold on to it with your hand on your unaffected side. 2. Bend forward at the waist. 3. Bend your knees slightly. 4. Relax your shoulder, arm, and hand. 5. While keeping your shoulder relaxed, use body motion to swing your arm in small circles. 6. Repeat changing the direction of the circles, swinging your arm left and right, and swinging your arm forward and back. 7. Between exercises, stand up tall and take a short break to relax your lower back.  Phase 2 exercises Do these exercises 1-2 times per day or as told by your health  care provider. Hold each stretch for 30 seconds, and repeat 3 times. Do the exercises with one or both arms as instructed by your health care provider. For these exercises, sit at a table with your hand and arm supported by the table. A chair that slides easily or has wheels can be helpful. External rotation 1. Turn your chair so that your affected side is nearest to the table. 2. Place your forearm on the table to your side. Bend your elbow about 90 at the elbow (right angle) and place your hand palm facing down on the table. Your elbow should be about 6 inches away from your side. 3. Keeping your arm on the table, lean your body forward. Abduction 1. Turn your chair so that your affected side is nearest to the table. 2. Place your forearm and hand on the table so that your thumb points toward the ceiling and your arm is straight out to your side. 3. Slide your hand out to the side and away from you, using your unaffected arm to do the work. 4. To increase the stretch, you can slide your chair away from the table. Flexion: forward stretch 1. Sit facing the table. Place your hand and elbow on the table in front of you. 2. Slide your hand forward and away from you, using your unaffected arm to do the work. 3. To increase the stretch, you can slide your chair backward.  Phase 3 exercises Do these exercises 1-2 times per day or as told by your health care provider. Hold each stretch for 30 seconds, and repeat 3 times. Do the exercises with one or both arms as instructed by your health care provider. Cross-body stretch: posterior capsule stretch 1. Lift your arm straight out in front of you. 2. Bend your arm 90 at the elbow (right angle) so your forearm moves across your body. 3. Use your other arm to gently pull the elbow across your body, toward your other shoulder. Wall climbs 1. Stand with your affected arm extended out to the side with your hand resting on a door frame. 2. Slide your hand  slowly up the door frame. 3. To increase the stretch, step through the door frame. Keep your body upright and do not lean. Wand exercises You will need a cane, a piece of PVC pipe, or a sturdy wooden dowel for wand exercises. Flexion To do this exercise while standing: 1. Hold the wand with both of your hands, palms down. 2. Using the other arm to help, lift your arms up and over your head, if able. 3. Push upward with your other arm to gently increase the stretch. To do this exercise while lying down: 1. Lie on your back with your elbows resting on the floor and the wand in both your hands. Your hands will be palm down, or pointing toward your feet. 2. Lift your hands toward the ceiling, using your unaffected arm to help if needed. 3. Bring your arms overhead as able, using your unaffected arm to help if needed. Internal rotation 1. Stand while holding the wand behind you with both hands. Your unaffected arm should be extended above your head with the arm of the affected side extended behind you at the level of your waist. The wand should be pointing straight up and down as you hold it. 2. Slowly pull the wand up behind your back by straightening the elbow of your unaffected arm and bending the elbow of your affected arm. External rotation 1. Lie on your back with your affected upper arm supported on a small pillow or rolled towel. When you first do this exercise, keep your upper arm close to your body. Over time, bring your arm up to a 90 angle out to the side. 2. Hold the wand across your stomach and with both hands palm up. Your elbow on your affected side should be bent at a 90 angle. 3. Use your unaffected side to help push your forearm away from you and toward the floor. Keep your elbow on your affected side bent at a 90 angle. Contact a health care provider if you have:  New or increasing pain.  New numbness, tingling, weakness, or discoloration in your arm or hand. This  information is not intended to replace advice given to you by your health care provider. Make sure you discuss any questions you have with your health care provider. Document Revised: 04/15/2017 Document Reviewed: 04/15/2017 Elsevier Patient Education  2020 [place] Cuff Tendinitis  Rotator cuff tendinitis is inflammation of the tough, cord-like bands that connect muscle to bone (tendons) in the rotator cuff. The rotator cuff includes all of the muscles and tendons that connect the arm to the shoulder. The rotator cuff holds the head of the upper arm bone (humerus) in the cup (fossa) of the shoulder blade (scapula). This condition can lead to a long-lasting (chronic) tear. The tear may be partial or  complete. What are the causes? This condition is usually caused by overusing the rotator cuff. What increases the risk? This condition is more likely to develop in athletes and workers who frequently use their shoulder or reach over their heads. This can include activities such as:  Tennis.  Baseball or softball.  Swimming.  Construction work.  Painting. What are the signs or symptoms? Symptoms of this condition include:  Pain spreading (radiating) from the shoulder to the upper arm.  Swelling and tenderness in front of the shoulder.  Pain when reaching, pulling, or lifting the arm above the head.  Pain when lowering the arm from above the head.  Minor pain in the shoulder when resting.  Increased pain in the shoulder at night.  Difficulty placing the arm behind the back. How is this diagnosed? This condition is diagnosed with a medical history and physical exam. Tests may also be done, including:  X-rays.  MRI.  Ultrasounds.  CT or MR arthrogram. During this test, a contrast material is injected and then images are taken. How is this treated? Treatment for this condition depends on the severity of the condition. In less severe cases, treatment may  include:  Rest. This may be done with a sling that holds the shoulder still (immobilization). Your health care provider may also recommend avoiding activities that involve lifting your arm over your head.  Icing the shoulder.  Anti-inflammatory medicines, such as aspirin or ibuprofen. In more severe cases, treatment may include:  Physical therapy.  Steroid injections.  Surgery. Follow these instructions at home: If you have a sling:  Wear the sling as told by your health care provider. Remove it only as told by your health care provider.  Loosen the sling if your fingers tingle, become numb, or turn cold and blue.  Keep the sling clean.  If the sling is not waterproof, do not let it get wet. Remove it, if allowed, or cover it with a watertight covering when you take a bath or shower. Managing pain, stiffness, and swelling  If directed, put ice on the injured area. ? If you have a removable sling, remove it as told by your health care provider. ? Put ice in a plastic bag. ? Place a towel between your skin and the bag. ? Leave the ice on for 20 minutes, 2-3 times a day.  Move your fingers often to avoid stiffness and to lessen swelling.  Raise (elevate) the injured area above the level of your heart while you are lying down.  Find a comfortable sleeping position or sleep on a recliner, if available. Driving  Do not drive or use heavy machinery while taking prescription pain medicine.  Ask your health care provider when it is safe to drive if you have a sling on your arm. Activity  Rest your shoulder as told by your health care provider.  Return to your normal activities as told by your health care provider. Ask your health care provider what activities are safe for you.  Do any exercises or stretches as told by your health care provider.  If you do repetitive overhead tasks, take small breaks in between and include stretching exercises as told by your health care  provider. General instructions  Do not use any products that contain nicotine or tobacco, such as cigarettes and e-cigarettes. These can delay healing. If you need help quitting, ask your health care provider.  Take over-the-counter and prescription medicines only as told by your health care provider.  Keep all follow-up visits as told by your health care provider. This is important. Contact a health care provider if:  Your pain gets worse.  You have new pain in your arm, hands, or fingers.  Your pain is not relieved with medicine or does not get better after 6 weeks of treatment.  You have cracking sensations when moving your shoulder in certain directions.  You hear a snapping sound after using your shoulder, followed by severe pain and weakness. Get help right away if:  Your arm, hand, or fingers are numb or tingling.  Your arm, hand, or fingers are swollen or painful or they turn white or blue. Summary  Rotator cuff tendinitis is inflammation of the tough, cord-like bands that connect muscle to bone (tendons) in the rotator cuff.  This condition is usually caused by overusing the rotator cuff, which includes all of the muscles and tendons that connect the arm to the shoulder.  This condition is more likely to develop in athletes and workers who frequently use their shoulder or reach over their heads.  Treatment generally includes rest, anti-inflammatory medicines, and icing. In some cases, physical therapy and steroid injections may be needed. In severe cases, surgery may be needed. This information is not intended to replace advice given to you by your health care provider. Make sure you discuss any questions you have with your health care provider. Document Revised: 06/25/2018 Document Reviewed: 02/18/2016 Elsevier Patient Education  [place].

## 2019-04-05 NOTE — Progress Notes (Signed)
Subjective  CC:  Chief Complaint  Patient presents with   Transitions Of Care    transitioning from Dr Juleen China   Bilateral leg itching   Right shoulder pain    started about a month ago    HPI: Madison White is a 86 y.o. female who presents to [facility] today to establish care with me as a new patient.   She has the following concerns or needs:  Very pleasant 86 year old female with history of complete heart block status post biventricular pacer on long-term anticoagulation for septal dyssynchrony and mild nonischemic cardiomyopathy.  Does very well.  Has reported hypertension on low-dose beta-blocker.  Due for lab work.  Energy level is good.  Recent annual wellness visit reviewed.  Multiple records reviewed including cardiology studies.  Complains of right shoulder pain for the last several months.  No known injuries.  Pain with overhead use.  No weakness in the arm or neck pain.  Pain only with certain movements.  No radicular symptoms.  Complains of itching bilateral lower extremities over the last few weeks.  No rash.  Does have dry skin.  Health maintenance: Due for bone density screening.  No documented results in chart that I can see.  Takes calcium and vitamin D.  No history of osteoporosis or fragility fractures.  Immunizations are up-to-date.  Of note, she is scheduled for the Covid vaccination this week.  Assessment  1. Presence of biventricular cardiac pacemaker   2. Hypoestrogenism   3. Right rotator cuff tendonitis   4. Xerosis cutis   5. History of complete heart block   6. Nonischemic cardiomyopathy ([place])   7. HTN (hypertension), benign      Plan   Cardiac disease: History of heart block with pacer in long-term management coagulation: Stable.  Blood pressures fairly well controlled.  Continue beta-blocker.  Right rotator cuff tendinopathy: Education given.  Start ice and range of motion exercises.  Will return after her  Covid vaccination and we will consider steroid injection and/or x-rays or physical therapy.  Patient understands and agrees.  See after visit summary for instructions.  Dry skin: Reassured.  Discussed appropriate moisturizers.  Possible hypertension: Continue beta-blocker and monitor.  Health maintenance: Schedule bone density screening.  Follow up: 3 weeks to reevaluate shoulder pain Orders Placed This Encounter  Procedures   DG Bone Density   CBC with Differential/Platelet   Comprehensive metabolic panel   Lipid panel   TSH   No orders of the defined types were placed in this encounter.    Depression screen [facility] 2/9 01/24/2019 12/08/2017 01/14/2017 01/07/2016  Decreased Interest 0 0 0 0  Down, Depressed, Hopeless 0 0 0 0  PHQ - 2 Score 0 0 0 0  Altered sleeping - 0 - -  Tired, decreased energy - 0 - -  Change in appetite - 0 - -  Feeling bad or failure about yourself  - 0 - -  Trouble concentrating - 0 - -  Moving slowly or fidgety/restless - 0 - -  Suicidal thoughts - 0 - -  PHQ-9 Score - 0 - -  Difficult doing work/chores - Not difficult at all - -    We updated and reviewed the patient's past history in detail and it is documented below.  Patient Active Problem List   Diagnosis Date Noted   Presence of biventricular cardiac pacemaker 04/05/2019   History of complete heart block 05/05/2017   Renal cyst  03/01/2017   Hydronephrosis with urinary obstruction due to renal calculus 03/01/2017   Total knee replacement status, bilateral 11/06/2016   Nonischemic cardiomyopathy ([place]) 11/17/2014   Upper airway cough syndrome 02/26/2014   Constipation, chronic 02/26/2014   GERD (gastroesophageal reflux disease) 11/19/2012   Dyslipidemia 09/02/2012   Left bundle branch block 12/18/2011   HTN (hypertension), benign 05/31/2011   Health Maintenance  Topic Date Due   DEXA SCAN  12/11/1995   TETANUS/TDAP  09/07/2021   INFLUENZA VACCINE  Completed   PNA vac  Low Risk Adult  Completed   Immunization History  Administered Date(s) Administered   Fluad Quad(high Dose 65+) 12/17/2018   Influenza, High Dose Seasonal PF 12/28/2014, 01/14/2017, 12/08/2017   Influenza,inj,Quad PF,6+ Mos 02/20/2014   Influenza,inj,Quad PF,6-35 Mos 11/19/2012   Influenza-Unspecified 12/26/2015   Pneumococcal Conjugate-13 12/28/2014   Pneumococcal Polysaccharide-23 05/20/2006   Tdap 09/08/2011   Current Meds  Medication Sig   apixaban (ELIQUIS) 5 MG TABS tablet Take 1 tablet by mouth twice daily, please schedule MD appt for further refills   metoprolol tartrate (LOPRESSOR) 25 MG tablet Take 1 tablet (25 mg total) by mouth 2 (two) times daily.    Allergies: Patient is allergic to penicillins; nickel; avapro [irbesartan]; lisinopril; simvastatin; latex; sulfa antibiotics; and sulfamethoxazole. Past Medical History Patient  has a past medical history of Acute lower GI bleeding, Diastolic dysfunction, left ventricle, GERD (gastroesophageal reflux disease), Headache disorder (2016), History of blood transfusion, History of rheumatic fever, HTN (hypertension), Hyperlipidemia, Insulin resistance, Left bundle branch block (12/2011), LVH (left ventricular hypertrophy), Nonischemic cardiomyopathy ([employer]) (2016), Osteoarthritis, Presence of biventricular cardiac pacemaker (04/05/2019), Presence of permanent cardiac pacemaker (05/05/2017), Seasonal allergies, and Solitary pulmonary nodule (05/2016). Past Surgical History Patient  has a past surgical history that includes Cesarean section (X 5); Strabismus surgery (1939); Abdominal hysterectomy (1963); Cardiovascular stress test (08/2005 & 12/2011); Cataract extraction (08/14/11); transthoracic echocardiogram (01/13/12); Carotid dopplers (11/22/14; 11/30/16); Cholecystectomy; right knee surgery (Right, 05/2016); Joint replacement (Bilateral); Tonsillectomy (1938); Bi-ventricular pacemaker insertion (crt-p) (05/05/2017); Insert /  replace / remove pacemaker; BIV PACEMAKER INSERTION CRT-P (N/A, 05/05/2017); Colonoscopy with propofol (N/A, 09/02/2017); and Hot hemostasis (N/A, 09/02/2017). Family History: Patient family history includes Cancer in her father; Diabetes in her mother; Heart disease in her father and mother; Hyperlipidemia in her mother; Hypertension in her mother. Social History:  Patient  reports that she quit smoking about 60 years ago. She has never used smokeless tobacco. She reports current alcohol use of about 1.0 standard drinks of alcohol per week. She reports that she does not use drugs.  Review of Systems: Constitutional: negative for fever or malaise Ophthalmic: negative for photophobia, double vision or loss of vision Cardiovascular: negative for chest pain, dyspnea on exertion, or new LE swelling Respiratory: negative for SOB or persistent cough Allergic/Immunologic: negative for hives  Patient Care Team    Relationship Specialty Notifications Start End  Leamon Arnt, MD PCP - General Family Medicine  04/05/19   Croitoru, Dani Gobble, MD Consulting Physician Cardiology  01/21/12   Pieter Partridge, DO Consulting Physician Neurology  12/28/14   Ninetta Lights, MD (Inactive) Consulting Physician Orthopedic Surgery  10/25/15   Gaynelle Arabian, MD Consulting Physician Orthopedic Surgery  01/07/16   Crista Luria, [place] Physician Dermatology  01/07/16   Felton [place]  Optometry  01/07/16   [place]  Dentistry  01/07/16   Lavonna Monarch, MD Consulting Physician Dermatology  04/05/19   Myrlene Broker, MD Attending Physician Urology  04/05/19  Objective  Vitals: BP (!) 146/88 (BP Location: Left Arm, Patient Position: Sitting, Cuff Size: Normal)    Pulse 61    Temp 97.7 F (36.5 C) (Temporal)    Ht 5\' 1"  (1.549 m)    Wt 151 lb 12.8 oz (68.9 kg)    SpO2 94%    BMI 28.68 kg/m  General:  Well developed, well nourished, no acute distress  Psych:  Alert and oriented,normal mood and affect HEENT:   Normocephalic, atraumatic, non-icteric sclera, PERRL, oropharynx is without mass or exudate, supple neck without adenopathy, mass or thyromegaly Cardiovascular:  RRR without gallop, systolic murmur is present  respiratory:  Good breath sounds bilaterally, CTAB with normal respiratory effort Skin:  Warm, no rashes or suspicious lesions noted, dry flaking skin bilateral lower extremities Neurologic:    Mental status is normal   Commons side effects, risks, benefits, and alternatives for medications and treatment plan prescribed today were discussed, and the patient expressed understanding of the given instructions. Patient is instructed to call or message via MyChart if he/she has any questions or concerns regarding our treatment plan. No barriers to understanding were identified. We discussed Red Flag symptoms and signs in detail. Patient expressed understanding regarding what to do in case of urgent or emergency type symptoms.   Medication list was reconciled, printed and provided to the patient in AVS. Patient instructions and summary information was reviewed with the patient as documented in the AVS. This note was prepared with assistance of Dragon voice recognition software. Occasional wrong-word or sound-a-like substitutions may have occurred due to the inherent limitations of voice recognition software  This visit occurred during the SARS-CoV-2 public health emergency.  Safety protocols were in place, including screening questions prior to the visit, additional usage of staff PPE, and extensive cleaning of exam room while observing appropriate contact time as indicated for disinfecting solutions.

## 2019-04-06 LAB — COMPREHENSIVE METABOLIC PANEL
ALT: 9 U/L (ref 0–35)
AST: 18 U/L (ref 0–37)
Albumin: 4 g/dL (ref 3.5–5.2)
Alkaline Phosphatase: 58 U/L (ref 39–117)
BUN: 14 mg/dL (ref 6–23)
CO2: 30 mEq/L (ref 19–32)
Calcium: 9.7 mg/dL (ref 8.4–10.5)
Chloride: 102 mEq/L (ref 96–112)
Creatinine, Ser: 0.92 mg/dL (ref 0.40–1.20)
GFR: 57.57 mL/min — ABNORMAL LOW (ref >60.00)
Glucose, Bld: 114 mg/dL — ABNORMAL HIGH (ref 70–99)
Potassium: 4.4 mEq/L (ref 3.5–5.1)
Sodium: 140 mEq/L (ref 135–145)
Total Bilirubin: 0.4 mg/dL (ref 0.2–1.2)
Total Protein: 6.9 g/dL (ref 6.0–8.3)

## 2019-04-06 LAB — CBC WITH DIFFERENTIAL/PLATELET
Basophils Absolute: 0.1 10*3/uL (ref 0.0–0.1)
Basophils Relative: 1 % (ref 0.0–3.0)
Eosinophils Absolute: 0.2 10*3/uL (ref 0.0–0.7)
Eosinophils Relative: 1.6 % (ref 0.0–5.0)
HCT: 42.3 % (ref 36.0–46.0)
Hemoglobin: 14.1 g/dL (ref 12.0–15.0)
Lymphocytes Relative: 22.4 % (ref 12.0–46.0)
Lymphs Abs: 2.2 10*3/uL (ref 0.7–4.0)
MCHC: 33.3 g/dL (ref 30.0–36.0)
MCV: 85.4 fl (ref 78.0–100.0)
Monocytes Absolute: 0.8 10*3/uL (ref 0.1–1.0)
Monocytes Relative: 8.2 % (ref 3.0–12.0)
Neutro Abs: 6.7 10*3/uL (ref 1.4–7.7)
Neutrophils Relative %: 66.8 % (ref 43.0–77.0)
Platelets: 341 10*3/uL (ref 150.0–400.0)
RBC: 4.96 Mil/uL (ref 3.87–5.11)
RDW: 14.4 % (ref 11.5–15.5)
WBC: 10 10*3/uL (ref 4.0–10.5)

## 2019-04-06 LAB — LDL CHOLESTEROL, DIRECT: Direct LDL: 125 mg/dL

## 2019-04-06 LAB — LIPID PANEL
Cholesterol: 207 mg/dL — ABNORMAL HIGH (ref 0–200)
HDL: 66.2 mg/dL (ref >39.00)
NonHDL: 141.24
Total CHOL/HDL Ratio: 3
Triglycerides: 207 mg/dL — ABNORMAL HIGH (ref 0.0–149.0)
VLDL: 41.4 mg/dL — ABNORMAL HIGH (ref 0.0–40.0)

## 2019-04-06 LAB — TSH: TSH: 3.45 u[IU]/mL (ref 0.35–4.50)

## 2019-04-11 ENCOUNTER — Telehealth: Payer: Self-pay | Admitting: *Deleted

## 2019-04-11 NOTE — Telephone Encounter (Signed)
Pt granddaughter called Raquel Sarna back. She states the pt feels good except for a runny nose. [name] phone number is [phone]

## 2019-04-11 NOTE — Telephone Encounter (Signed)
PPM alert received showing ongoing AF since 04/09/19 (as of 04/10/19 at 02:00). V rates controlled.   Spoke with patient's granddaughter, Cyril Mourning (DPR). Per Cyril Mourning, pt has reported increased fatigue for the past week. Compliant with Eliquis and Lopressor. Not aware of any other symptoms, but she will call pt and call us back with an update. Explained options for AF Clinic vs f/u with Dr. Lovena Le (overdue), also offered virtual visit. Cyril Mourning will not be able to assist with manual transmission until tomorrow.

## 2019-04-11 NOTE — Telephone Encounter (Signed)
Spoke with [employer]. Advised that as patient is feeling well at this time, other than "the sniffles" when she is cold, will send message to scheduler to arrange appointment with Dr. Lovena Le. Cyril Mourning is in agreement with plan and will call back if pt develops any new or worsening symptoms.

## 2019-04-14 NOTE — Telephone Encounter (Signed)
Patient is scheduled for f/u with Dr. Lovena Le on 04/28/19.

## 2019-04-18 DIAGNOSIS — N281 Cyst of kidney, acquired: Secondary | ICD-10-CM | POA: Diagnosis not present

## 2019-04-18 DIAGNOSIS — N200 Calculus of kidney: Secondary | ICD-10-CM | POA: Diagnosis not present

## 2019-04-28 ENCOUNTER — Other Ambulatory Visit: Payer: Self-pay

## 2019-04-28 ENCOUNTER — Encounter: Payer: Self-pay | Admitting: Internal Medicine

## 2019-04-28 ENCOUNTER — Ambulatory Visit (INDEPENDENT_AMBULATORY_CARE_PROVIDER_SITE_OTHER): Payer: Medicare HMO | Admitting: Internal Medicine

## 2019-04-28 ENCOUNTER — Ambulatory Visit: Payer: Medicare HMO | Admitting: Family Medicine

## 2019-04-28 VITALS — BP 140/82 | HR 66 | Ht 61.0 in | Wt 150.8 lb

## 2019-04-28 DIAGNOSIS — I447 Left bundle-branch block, unspecified: Secondary | ICD-10-CM

## 2019-04-28 DIAGNOSIS — I428 Other cardiomyopathies: Secondary | ICD-10-CM

## 2019-04-28 DIAGNOSIS — Z950 Presence of cardiac pacemaker: Secondary | ICD-10-CM

## 2019-04-28 DIAGNOSIS — Z8679 Personal history of other diseases of the circulatory system: Secondary | ICD-10-CM | POA: Diagnosis not present

## 2019-04-28 NOTE — Patient Instructions (Signed)
Medication Instructions:  Your physician recommends that you continue on your current medications as directed. Please refer to the Current Medication list given to you today.  Labwork: None ordered.  Testing/Procedures: None ordered.  Follow-Up: Your physician wants you to follow-up in: one year with Dr. Lovena Le.   You will receive a reminder letter in the mail two months in advance. If you don't receive a letter, please call our office to schedule the follow-up appointment.  Remote monitoring is used to monitor your Pacemaker from home. This monitoring reduces the number of office visits required to check your device to one time per year. It allows [place] to keep an eye on the functioning of your device to ensure it is working properly. You are scheduled for a device check from home on 05/02/2019. You may send your transmission at any time that day. If you have a wireless device, the transmission will be sent automatically. After your physician reviews your transmission, you will receive a postcard with your next transmission date.  Any Other Special Instructions Will Be Listed Below (If Applicable).  If you need a refill on your cardiac medications before your next appointment, please call your pharmacy.

## 2019-04-28 NOTE — Progress Notes (Signed)
HPI Mrs. Manzione returns today for followup. She is a pleasant elderly woman with CHB, s/p PPM insertion, rare PAF, on Eliquis, and HTN. She has done well in the interim. She denies chest pain or sob.  Allergies  Allergen Reactions  . Penicillins Hives and Swelling    Has patient had a PCN reaction causing immediate rash, facial/tongue/throat swelling, SOB or lightheadedness with hypotension: Yes Has patient had a PCN reaction causing severe rash involving mucus membranes or skin necrosis: No Has patient had a PCN reaction that required hospitalization: No Has patient had a PCN reaction occurring within the last 10 years: No If all of the above answers are "NO", then may proceed with Cephalosporin use.   . Nickel Swelling and Rash  . Avapro [Irbesartan] Other (See Comments)    Unknown rxn  . Lisinopril Swelling    Swelling around eyes; also says it caused increased sugar and BP  . Simvastatin Other (See Comments)    unknown  . Latex Rash  . Sulfa Antibiotics Rash  . Sulfamethoxazole Rash     Current Outpatient Medications  Medication Sig Dispense Refill  . apixaban (ELIQUIS) 5 MG TABS tablet Take 1 tablet by mouth twice daily, please schedule MD appt for further refills 180 tablet 0  . metoprolol tartrate (LOPRESSOR) 25 MG tablet Take 1 tablet (25 mg total) by mouth 2 (two) times daily. 180 tablet 0  . omeprazole (PRILOSEC) 20 MG capsule TAKE 1 CAPSULE BY MOUTH EVERY DAY 90 capsule 1   No current facility-administered medications for this visit.     Past Medical History:  Diagnosis Date  . Acute lower GI bleeding   . Diastolic dysfunction, left ventricle   . GERD (gastroesophageal reflux disease)   . 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   [name] History of blood transfusion   . History of rheumatic fever   . HTN (hypertension)    hx of refusing treatment--White coat HTN and/or situational HTN (?)   . Hyperlipidemia    hx of refusing treatment  . Insulin resistance    A1c's excellent per old records (6.1 in 2008 and 2009)  . Left bundle branch block 12/2011   Dr. Sallyanne Kuster at [facility] H&V; ECHO and myocardial perfusion scan showed  septal and apical wall motion abnormality but she had no significant valvular disease and no ischemia.  She did have mildly decreased EF (39% on lexiscan and 50% on echo) and abnl LV relaxation.  Mild amount of PVCs.  Pt at higher risk for other conduction abnormalities, good chance of eventually requiring a pacemaker.   [name] LVH (left ventricular hypertrophy)   . Nonischemic cardiomyopathy ([place]) 2016   LV dysfunction due to LBBB/septal dyssynchrony  . Osteoarthritis    bilat knees--needs bilat TKA.  Ortho is trying steroid injections as of 10/23/15.  . Presence of biventricular cardiac pacemaker 04/05/2019  . Presence of permanent cardiac pacemaker 05/05/2017  . Seasonal allergies   . Solitary pulmonary nodule 05/2016   8 mm pleural based nodule in RLL.  Radiology recommended f/u noncontrast CT in 6-12 mo.    ROS:   All systems reviewed and negative except as noted in the HPI.   Past Surgical History:  Procedure Laterality Date  . ABDOMINAL  HYSTERECTOMY  1963   At the time of her last C/S; says she had partial bladder resection at that time as well  . BI-VENTRICULAR PACEMAKER INSERTION (CRT-P)  05/05/2017  . BIV PACEMAKER INSERTION CRT-P N/A 05/05/2017   Procedure: BIV PACEMAKER INSERTION CRT-P;  Surgeon: Evans Lance, MD;  Location: [place] [facility];  Service: Cardiovascular;  Laterality: N/A;  . CARDIOVASCULAR STRESS TEST  08/2005 & 12/2011   Low risk scans (on the 12/2011 scan she did have EF 39% with moderately severe LV dysfunction with septal dyssynergy probably contributed by LBBB  . Carotid  dopplers  11/22/14; 11/30/16   NORMAL 2016 and 2018  . CATARACT EXTRACTION  08/14/11   both  . CESAREAN SECTION  X 5   One of her neonates died soon after birth  . CHOLECYSTECTOMY     1980's  . COLONOSCOPY WITH PROPOFOL N/A 09/02/2017   Procedure: COLONOSCOPY WITH PROPOFOL;  Surgeon: Ladene Artist, MD;  Location: [facility] ENDOSCOPY;  Service: Endoscopy;  Laterality: N/A;  . HOT HEMOSTASIS N/A 09/02/2017   Procedure: HOT HEMOSTASIS (ARGON PLASMA COAGULATION/BICAP);  Surgeon: Ladene Artist, MD;  Location: [facility] ENDOSCOPY;  Service: Endoscopy;  Laterality: N/A;  . INSERT / REPLACE / REMOVE PACEMAKER    . JOINT REPLACEMENT Bilateral    knee  . right knee surgery Right 05/2016   ? R TKA: no records.  [name] STRABISMUS SURGERY  1939  . TONSILLECTOMY  1938  . TRANSTHORACIC ECHOCARDIOGRAM  01/13/12   Septal dyssynergy, EF 50%, LV relaxation impaired.  No significant valvular abnormalities.     Family History  Problem Relation Age of Onset  . Hypertension Mother   . Heart disease Mother   . Hyperlipidemia Mother   . Diabetes Mother   . Heart disease Father   . Cancer Father      Social History   Socioeconomic History  . Marital status: Widowed    Spouse name: Not on file  . Number of children: Not on file  . Years of education: Not on file  . Highest education level: Not on file  Occupational History  . Not on file  Tobacco Use  . Smoking status: Former Smoker    Quit date: 05/31/1958    Years since quitting: 60.9  . Smokeless tobacco: Never Used  Substance and Sexual Activity  . Alcohol use: Yes    Alcohol/week: 1.0 standard drinks    Types: 1 Glasses of wine per week  . Drug use: No  . Sexual activity: Not on file  Other Topics Concern  . Not on file  Social History Narrative   Widower, husband died around 84 (wrongful death per pt's report).   Originally from [place], has been in [place] since about 1997.   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   Distant hx of tobacco abuse.  No alcohol or drugs.   Social Determinants of Health   Financial Resource Strain:   . Difficulty of Paying Living Expenses: Not on file  Food Insecurity:   . Worried About Charity fundraiser in the Last Year: Not on file  . Ran Out of Food in the Last Year: Not on file  Transportation Needs:   . Lack of Transportation (Medical): Not on file  . Lack of Transportation (Non-Medical): Not on file  Physical Activity:   . Days of Exercise per Week: Not on file  . Minutes of Exercise per Session:  Not on file  Stress:   . Feeling of Stress : Not on file  Social Connections:   . Frequency of Communication with Friends and Family: Not on file  . Frequency of Social Gatherings with Friends and Family: Not on file  . Attends Religious Services: Not on file  . Active Member of Clubs or Organizations: Not on file  . Attends Archivist Meetings: Not on file  . Marital Status: Not on file  Intimate Partner Violence:   . Fear of Current or Ex-Partner: Not on file  . Emotionally Abused: Not on file  . Physically Abused: Not on file  . Sexually Abused: Not on file     BP 140/82   Pulse 66   Ht 5\' 1"  (1.549 m)   Wt 150 lb 12.8 oz (68.4 kg)   SpO2 (!) 57%   BMI 28.49 kg/m   Physical Exam:  Well appearing NAD HEENT: Unremarkable Neck:  No JVD, no thyromegally Lymphatics:  No adenopathy Back:  No CVA tenderness Lungs:  Clear with no wheezes HEART:  Regular rate rhythm, no murmurs, no rubs, no clicks Abd:  soft, positive bowel sounds, no organomegally, no rebound, no guarding Ext:  2 plus pulses, no edema, no cyanosis, no clubbing Skin:  No rashes no nodules Neuro:  CN II through XII intact, motor grossly intact  EKG - NSR with Biv pacing  DEVICE  Normal device function.  See PaceArt for details.   Assess/Plan: 1. PAF - she is maintaining NSR about 99% of the time. 2. HTN - her bp is elevated. She is encouraged to  maintain a low sodium diet. 3. PPM - her [employer] biv PPM is working normally. She will recheck in several months. 4. Chronic systolic heart failure - her symptoms are class 2. She will continue her current meds. I asked her to maintain a low sodium diet.   Madison White.D.

## 2019-04-29 ENCOUNTER — Other Ambulatory Visit: Payer: Self-pay | Admitting: Cardiovascular Disease

## 2019-05-01 ENCOUNTER — Ambulatory Visit: Payer: Medicare HMO | Attending: Internal Medicine

## 2019-05-01 DIAGNOSIS — Z23 Encounter for immunization: Secondary | ICD-10-CM | POA: Insufficient documentation

## 2019-05-01 NOTE — Progress Notes (Signed)
   Covid-19 Vaccination Clinic  Name:  Madison White    MRN: [id number] DOB: 1929/03/14  05/01/2019  [name] was observed post Covid-19 immunization for 15 minutes without incidence. She was provided with Vaccine Information Sheet and instruction to access the V-Safe system.   [name] was instructed to call 911 with any severe reactions post vaccine: [name] Difficulty breathing  . Swelling of your face and throat  . A fast heartbeat  . A bad rash all over your body  . Dizziness and weakness    Immunizations Administered    Name Date Dose VIS Date Route   Pfizer COVID-19 Vaccine 05/01/2019  9:32 AM 0.3 mL 02/25/2019 Intramuscular   Manufacturer: [place]   Lot: [id number]   [place]: [id number]

## 2019-05-02 ENCOUNTER — Ambulatory Visit (INDEPENDENT_AMBULATORY_CARE_PROVIDER_SITE_OTHER): Payer: Medicare HMO | Admitting: *Deleted

## 2019-05-02 DIAGNOSIS — Z8679 Personal history of other diseases of the circulatory system: Secondary | ICD-10-CM | POA: Diagnosis not present

## 2019-05-02 LAB — CUP PACEART REMOTE DEVICE CHECK
Battery Remaining Longevity: 90 mo
Battery Remaining Percentage: 95.5 %
Battery Voltage: 2.99 V
Brady Statistic AP VP Percent: 9.8 %
Brady Statistic AP VS Percent: 1 %
Brady Statistic AS VP Percent: 89 %
Brady Statistic AS VS Percent: 1 %
Brady Statistic RA Percent Paced: 9.2 %
Date Time Interrogation Session: 20210215034949
Implantable Lead Implant Date: 20190219
Implantable Lead Implant Date: 20190219
Implantable Lead Implant Date: 20190219
Implantable Lead Location: 753858
Implantable Lead Location: 753859
Implantable Lead Location: 753860
Implantable Pulse Generator Implant Date: 20190219
Lead Channel Impedance Value: 460 Ohm
Lead Channel Impedance Value: 690 Ohm
Lead Channel Impedance Value: 850 Ohm
Lead Channel Pacing Threshold Amplitude: 0.5 V
Lead Channel Pacing Threshold Amplitude: 1 V
Lead Channel Pacing Threshold Amplitude: 1.75 V
Lead Channel Pacing Threshold Pulse Width: 0.5 ms
Lead Channel Pacing Threshold Pulse Width: 0.5 ms
Lead Channel Pacing Threshold Pulse Width: 0.8 ms
Lead Channel Sensing Intrinsic Amplitude: 12 mV
Lead Channel Sensing Intrinsic Amplitude: 3.1 mV
Lead Channel Setting Pacing Amplitude: 2 V
Lead Channel Setting Pacing Amplitude: 2.5 V
Lead Channel Setting Pacing Amplitude: 2.5 V
Lead Channel Setting Pacing Pulse Width: 0.5 ms
Lead Channel Setting Pacing Pulse Width: 0.8 ms
Lead Channel Setting Sensing Sensitivity: 4 mV
Pulse Gen Model: 3262
Pulse Gen Serial Number: 8995296

## 2019-05-03 NOTE — Progress Notes (Signed)
PPM Remote  

## 2019-05-24 ENCOUNTER — Ambulatory Visit: Payer: Medicare HMO | Attending: Internal Medicine

## 2019-05-24 DIAGNOSIS — Z23 Encounter for immunization: Secondary | ICD-10-CM | POA: Insufficient documentation

## 2019-05-24 NOTE — Progress Notes (Signed)
   Covid-19 Vaccination Clinic  Name:  Madison White    MRN: [id number] DOB: Aug 28, 1928  05/24/2019  [name] was observed post Covid-19 immunization for 30 minutes based on pre-vaccination screening without incident. She was provided with Vaccine Information Sheet and instruction to access the V-Safe system.   [name] was instructed to call 911 with any severe reactions post vaccine: [name] Difficulty breathing  . Swelling of face and throat  . A fast heartbeat  . A bad rash all over body  . Dizziness and weakness   Immunizations Administered    Name Date Dose VIS Date Route   Pfizer COVID-19 Vaccine 05/24/2019  4:48 PM 0.3 mL 02/25/2019 Intramuscular   Manufacturer: [place]   Lot: [id number]   [place]: [id number]

## 2019-06-18 ENCOUNTER — Other Ambulatory Visit: Payer: Self-pay | Admitting: Family Medicine

## 2019-07-18 ENCOUNTER — Telehealth: Payer: Self-pay | Admitting: Emergency Medicine

## 2019-07-18 NOTE — Telephone Encounter (Signed)
LMOM. Patient contact # is daughter Gershon Mussel [facility]). Patient currently in AF with AF burden of 11 % , Need to assess for symptoms an confirm + Eliquis and metoprolol as directed.

## 2019-07-20 NOTE — Telephone Encounter (Signed)
LMOM . Patient # is daughter ; Gershon Mussel " # - Message to contact office to check on patient - device clinic number provided.

## 2019-07-21 NOTE — Telephone Encounter (Signed)
Manual transmission received.  Pt continues in AF.   Spoke with Sammuel Cooper.  DPR on file.  She states that although pt appears to be feeling better today, she still has noticed pt feeling very tired, sleeping more.  Discussed option of AF clinic.  She would like for pt to have consult.

## 2019-07-21 NOTE — Telephone Encounter (Signed)
Pt grand daughter called and I walked her through how to do a transmission and it was successful. I assured her that a nurse will review it and give her a call back.  Pt does feel better today than she did few days ago. Please call house phone

## 2019-07-21 NOTE — Telephone Encounter (Signed)
Patient has been feeling extremely tired over the past week. Grandaughter reports patient may not have taken her meds consistently last week due to her daughter being out of town. Will send a remote transmission this afternoon.

## 2019-07-21 NOTE — Telephone Encounter (Signed)
Follow up      Granddaughter Is returning call

## 2019-07-22 NOTE — Telephone Encounter (Signed)
Left message

## 2019-07-22 NOTE — Telephone Encounter (Signed)
appt made for 5/10

## 2019-07-25 ENCOUNTER — Ambulatory Visit (HOSPITAL_COMMUNITY)
Admission: RE | Admit: 2019-07-25 | Discharge: 2019-07-25 | Disposition: A | Discharge status: Home / Self Care | Payer: Medicare HMO | Source: Ambulatory Visit | Attending: Nurse Practitioner | Admitting: Nurse Practitioner

## 2019-07-25 ENCOUNTER — Other Ambulatory Visit: Payer: Self-pay

## 2019-07-25 VITALS — BP 160/74 | HR 70 | Ht 61.0 in | Wt 155.0 lb

## 2019-07-25 DIAGNOSIS — Z91040 Latex allergy status: Secondary | ICD-10-CM | POA: Diagnosis not present

## 2019-07-25 DIAGNOSIS — Z882 Allergy status to sulfonamides status: Secondary | ICD-10-CM | POA: Diagnosis not present

## 2019-07-25 DIAGNOSIS — I480 Paroxysmal atrial fibrillation: Secondary | ICD-10-CM | POA: Insufficient documentation

## 2019-07-25 DIAGNOSIS — Z950 Presence of cardiac pacemaker: Secondary | ICD-10-CM | POA: Diagnosis not present

## 2019-07-25 DIAGNOSIS — Z87891 Personal history of nicotine dependence: Secondary | ICD-10-CM | POA: Diagnosis not present

## 2019-07-25 DIAGNOSIS — Z79899 Other long term (current) drug therapy: Secondary | ICD-10-CM | POA: Insufficient documentation

## 2019-07-25 DIAGNOSIS — Z8249 Family history of ischemic heart disease and other diseases of the circulatory system: Secondary | ICD-10-CM | POA: Insufficient documentation

## 2019-07-25 DIAGNOSIS — I10 Essential (primary) hypertension: Secondary | ICD-10-CM | POA: Insufficient documentation

## 2019-07-25 DIAGNOSIS — Z833 Family history of diabetes mellitus: Secondary | ICD-10-CM | POA: Diagnosis not present

## 2019-07-25 DIAGNOSIS — D6869 Other thrombophilia: Secondary | ICD-10-CM | POA: Diagnosis not present

## 2019-07-25 DIAGNOSIS — Z7901 Long term (current) use of anticoagulants: Secondary | ICD-10-CM | POA: Diagnosis not present

## 2019-07-25 DIAGNOSIS — Z96653 Presence of artificial knee joint, bilateral: Secondary | ICD-10-CM | POA: Diagnosis not present

## 2019-07-25 DIAGNOSIS — I4892 Unspecified atrial flutter: Secondary | ICD-10-CM

## 2019-07-25 DIAGNOSIS — Z880 Allergy status to penicillin: Secondary | ICD-10-CM | POA: Diagnosis not present

## 2019-07-25 DIAGNOSIS — Z8349 Family history of other endocrine, nutritional and metabolic diseases: Secondary | ICD-10-CM | POA: Diagnosis not present

## 2019-07-25 DIAGNOSIS — Z888 Allergy status to other drugs, medicaments and biological substances status: Secondary | ICD-10-CM | POA: Diagnosis not present

## 2019-07-25 DIAGNOSIS — K219 Gastro-esophageal reflux disease without esophagitis: Secondary | ICD-10-CM | POA: Insufficient documentation

## 2019-07-25 DIAGNOSIS — E785 Hyperlipidemia, unspecified: Secondary | ICD-10-CM | POA: Diagnosis not present

## 2019-07-25 MED ORDER — METOPROLOL TARTRATE 25 MG PO TABS: 37.5000 mg | ORAL_TABLET | Freq: Two times a day (BID) | ORAL | 0 refills | Status: DC

## 2019-07-25 NOTE — Patient Instructions (Signed)
Increase metoprolol to 37.5mg twice a day (1 and 1/2 tablets twice a day)   

## 2019-07-26 ENCOUNTER — Other Ambulatory Visit: Payer: Self-pay | Admitting: Cardiovascular Disease

## 2019-07-26 ENCOUNTER — Encounter (HOSPITAL_COMMUNITY): Payer: Self-pay | Admitting: Nurse Practitioner

## 2019-07-26 NOTE — Progress Notes (Signed)
Primary Care Physician: Leamon Arnt, MD Referring Physician:Device clinic EP: Dr. Lovena Le  Cardiologist: Dr. Reynold Bowen Madison White is a 86 y.o. female with a h/o paroxysmal afib that was referred by the device clinic for her PPM showing presence of AF since 5/3. Pt reports that she is feeling more fatigued. V paced rhythm today at 70 bpm,by EKG,  BP at 160/74. She  is already on eliquis 5 mg bid for a CHA2DS2VASc score of at least 4. Also on BB.   Today, she denies symptoms of palpitations, chest pain, shortness of breath, orthopnea, PND, lower extremity edema, dizziness, presyncope, syncope, or neurologic sequela. The patient is tolerating medications without difficulties and is otherwise without complaint today.   Past Medical History:  Diagnosis Date  . Acute lower GI bleeding   . Diastolic dysfunction, left ventricle   . GERD (gastroesophageal reflux disease)   . 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  [name] History of blood transfusion   . History of rheumatic fever   . HTN (hypertension)    hx of refusing treatment--White coat HTN and/or situational HTN (?)   . Hyperlipidemia    hx of refusing treatment  . Insulin resistance    A1c's excellent per old records (6.1 in 2008 and 2009)  . Left bundle branch block 12/2011   Dr. Sallyanne Kuster at [facility] H&V; ECHO and myocardial perfusion scan showed  septal and apical wall motion abnormality but she had no significant valvular disease and no ischemia.  She did have mildly decreased EF (39% on lexiscan and 50% on echo) and abnl LV relaxation.  Mild amount of PVCs.  Pt at higher risk for other conduction abnormalities, good chance of eventually requiring a pacemaker.   [name] LVH (left ventricular hypertrophy)   .  Nonischemic cardiomyopathy ([place]) 2016   LV dysfunction due to LBBB/septal dyssynchrony  . Osteoarthritis    bilat knees--needs bilat TKA.  Ortho is trying steroid injections as of 10/23/15.  . Presence of biventricular cardiac pacemaker 04/05/2019  . Presence of permanent cardiac pacemaker 05/05/2017  . Seasonal allergies   . Solitary pulmonary nodule 05/2016   8 mm pleural based nodule in RLL.  Radiology recommended f/u noncontrast CT in 6-12 mo.   Past Surgical History:  Procedure Laterality Date  . ABDOMINAL HYSTERECTOMY  1963   At the time of her last C/S; says she had partial bladder resection at that time as well  . BI-VENTRICULAR PACEMAKER INSERTION (CRT-P)  05/05/2017  . BIV PACEMAKER INSERTION CRT-P N/A 05/05/2017   Procedure: BIV PACEMAKER INSERTION CRT-P;  Surgeon: Evans Lance, MD;  Location: [place] [facility];  Service: Cardiovascular;  Laterality: N/A;  . CARDIOVASCULAR STRESS TEST  08/2005 & 12/2011   Low risk scans (on the 12/2011 scan she did have EF 39% with moderately severe LV dysfunction with septal dyssynergy probably contributed by LBBB  . Carotid dopplers  11/22/14; 11/30/16   NORMAL 2016 and 2018  . CATARACT EXTRACTION  08/14/11   both  . CESAREAN SECTION  X 5   One of her neonates died soon after birth  . CHOLECYSTECTOMY     1980's  . COLONOSCOPY WITH PROPOFOL N/A 09/02/2017   Procedure: COLONOSCOPY WITH PROPOFOL;  Surgeon: Lucio Edward  T, MD;  Location: [facility] ENDOSCOPY;  Service: Endoscopy;  Laterality: N/A;  . HOT HEMOSTASIS N/A 09/02/2017   Procedure: HOT HEMOSTASIS (ARGON PLASMA COAGULATION/BICAP);  Surgeon: Ladene Artist, MD;  Location: [facility] ENDOSCOPY;  Service: Endoscopy;  Laterality: N/A;  . INSERT / REPLACE / REMOVE PACEMAKER    . JOINT REPLACEMENT Bilateral    knee  . right knee surgery Right 05/2016   ? R TKA: no records.  [name] STRABISMUS SURGERY  1939  . TONSILLECTOMY  1938  . TRANSTHORACIC ECHOCARDIOGRAM  01/13/12   Septal dyssynergy, EF 50%, LV  relaxation impaired.  No significant valvular abnormalities.    Current Outpatient Medications  Medication Sig Dispense Refill  . metoprolol tartrate (LOPRESSOR) 25 MG tablet Take 1.5 tablets (37.5 mg total) by mouth 2 (two) times daily. 180 tablet 0  . Multiple Vitamin (MULTIVITAMIN) tablet Take 1 tablet by mouth daily.    [name] omeprazole (PRILOSEC) 20 MG capsule TAKE 1 CAPSULE BY MOUTH EVERY DAY 90 capsule 1  . vitamin E (VITAMIN E) 180 MG (400 UNITS) capsule Take 400 Units by mouth daily.    [name] ELIQUIS 5 MG TABS tablet TAKE 1 TABLET BY MOUTH TWICE A DAY 180 tablet 0   No current facility-administered medications for this encounter.    Allergies  Allergen Reactions  . Penicillins Hives and Swelling    Has patient had a PCN reaction causing immediate rash, facial/tongue/throat swelling, SOB or lightheadedness with hypotension: Yes Has patient had a PCN reaction causing severe rash involving mucus membranes or skin necrosis: No Has patient had a PCN reaction that required hospitalization: No Has patient had a PCN reaction occurring within the last 10 years: No If all of the above answers are "NO", then may proceed with Cephalosporin use.   . Nickel Swelling and Rash  . Avapro [Irbesartan] Other (See Comments)    Unknown rxn  . Lisinopril Swelling    Swelling around eyes; also says it caused increased sugar and BP  . Simvastatin Other (See Comments)    unknown  . Latex Rash  . Sulfa Antibiotics Rash  . Sulfamethoxazole Rash    Social History   Socioeconomic History  . Marital status: Widowed    Spouse name: Not on file  . Number of children: Not on file  . Years of education: Not on file  . Highest education level: Not on file  Occupational History  . Not on file  Tobacco Use  . Smoking status: Former Smoker    Quit date: 05/31/1958    Years since quitting: 61.1  . Smokeless tobacco: Never Used  Substance and Sexual Activity  . Alcohol use: Yes    Alcohol/week: 1.0  standard drinks    Types: 1 Glasses of wine per week  . Drug use: No  . Sexual activity: Not on file  Other Topics Concern  . Not on file  Social History Narrative   Widower, husband died around 76 (wrongful death per pt's report).   Originally from [place], has been in [place] since about 1997.   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   Distant hx of tobacco abuse.  No alcohol or drugs.   Social Determinants of Health   Financial Resource Strain:   . Difficulty of Paying Living Expenses:   Food Insecurity:   . Worried About Charity fundraiser in the Last Year:   . [place] in the Last Year:  Transportation Needs:   . Film/video editor (Medical):   [name] Lack of Transportation (Non-Medical):   Physical Activity:   . Days of Exercise per Week:   . Minutes of Exercise per Session:   Stress:   . Feeling of Stress :   Social Connections:   . Frequency of Communication with Friends and Family:   . Frequency of Social Gatherings with Friends and Family:   . Attends Religious Services:   . Active Member of Clubs or Organizations:   . Attends Archivist Meetings:   [name] Marital Status:   Intimate Partner Violence:   . Fear of Current or Ex-Partner:   . Emotionally Abused:   [name] Physically Abused:   . Sexually Abused:     Family History  Problem Relation Age of Onset  . Hypertension Mother   . Heart disease Mother   . Hyperlipidemia Mother   . Diabetes Mother   . Heart disease Father   . Cancer Father     ROS- All systems are reviewed and negative except as per the HPI above  Physical Exam: Vitals:   07/25/19 1446  BP: (!) 160/74  Pulse: 70  Weight: 70.3 kg  Height: 5\' 1"  (1.549 m)   Wt Readings from Last 3 Encounters:  07/25/19 70.3 kg  04/28/19 68.4 kg  04/05/19 68.9 kg    Labs: Lab Results  Component Value Date   NA 140 04/05/2019   K 4.4 04/05/2019   CL 102 04/05/2019   CO2 30  04/05/2019   GLUCOSE 114 (H) 04/05/2019   BUN 14 04/05/2019   CREATININE 0.92 04/05/2019   CALCIUM 9.7 04/05/2019   MG 2.0 09/01/2017   Lab Results  Component Value Date   INR 1.08 08/31/2017   Lab Results  Component Value Date   CHOL 207 (H) 04/05/2019   HDL 66.20 04/05/2019   LDLCALC 111 (H) 01/07/2016   TRIG 207.0 (H) 04/05/2019     GEN- The patient is well appearing, alert and oriented x 3 today.   Head- normocephalic, atraumatic Eyes-  Sclera clear, conjunctiva pink Ears- hearing intact Oropharynx- clear Neck- supple, no JVP Lymph- no cervical lymphadenopathy Lungs- Clear to ausculation bilaterally, normal work of breathing Heart- Regular rate and rhythm, no murmurs, rubs or gallops, PMI not laterally displaced GI- soft, NT, ND, + BS Extremities- no clubbing, cyanosis, or edema MS- no significant deformity or atrophy Skin- no rash or lesion Psych- euthymic mood, full affect Neuro- strength and sensation are intact  EKG-v paced rhythm at 70 bpm, qrs int 166 ms, qrs int 512 ms    Assessment and Plan: 1. AF Present on device since 5/3 Associated with some fatigue  Increase metoprolol to 1 1/2 bid  2. CHA2DS2VASc score of 4 Continue eliquis 5 mg bid  Reminded not to miss any doses in case cardioversion is needed   F/u in office within one week   Butch Penny C. Valbona Slabach, [facility] [address] [place], [place] [address] [phone]

## 2019-08-01 ENCOUNTER — Ambulatory Visit (INDEPENDENT_AMBULATORY_CARE_PROVIDER_SITE_OTHER): Payer: Medicare HMO | Admitting: *Deleted

## 2019-08-01 ENCOUNTER — Other Ambulatory Visit: Payer: Self-pay

## 2019-08-01 ENCOUNTER — Ambulatory Visit (HOSPITAL_COMMUNITY)
Admission: RE | Admit: 2019-08-01 | Discharge: 2019-08-01 | Disposition: A | Discharge status: Home / Self Care | Payer: Medicare HMO | Source: Ambulatory Visit | Attending: Physician Assistant | Admitting: Physician Assistant

## 2019-08-01 VITALS — BP 170/76 | HR 70 | Wt 152.8 lb

## 2019-08-01 DIAGNOSIS — I428 Other cardiomyopathies: Secondary | ICD-10-CM | POA: Diagnosis not present

## 2019-08-01 DIAGNOSIS — I4819 Other persistent atrial fibrillation: Secondary | ICD-10-CM | POA: Insufficient documentation

## 2019-08-01 DIAGNOSIS — Z01818 Encounter for other preprocedural examination: Secondary | ICD-10-CM | POA: Diagnosis not present

## 2019-08-01 DIAGNOSIS — D6869 Other thrombophilia: Secondary | ICD-10-CM | POA: Insufficient documentation

## 2019-08-01 LAB — CUP PACEART REMOTE DEVICE CHECK
Battery Remaining Longevity: 89 mo
Battery Remaining Percentage: 95.5 %
Battery Voltage: 2.98 V
Brady Statistic AP VP Percent: 8.2 %
Brady Statistic AP VS Percent: 1 %
Brady Statistic AS VP Percent: 91 %
Brady Statistic AS VS Percent: 1 %
Brady Statistic RA Percent Paced: 5.9 %
Date Time Interrogation Session: 20210517020027
Implantable Lead Implant Date: 20190219
Implantable Lead Implant Date: 20190219
Implantable Lead Implant Date: 20190219
Implantable Lead Location: 753858
Implantable Lead Location: 753859
Implantable Lead Location: 753860
Implantable Pulse Generator Implant Date: 20190219
Lead Channel Impedance Value: 430 Ohm
Lead Channel Impedance Value: 650 Ohm
Lead Channel Impedance Value: 840 Ohm
Lead Channel Pacing Threshold Amplitude: 0.5 V
Lead Channel Pacing Threshold Amplitude: 1 V
Lead Channel Pacing Threshold Amplitude: 1.75 V
Lead Channel Pacing Threshold Pulse Width: 0.5 ms
Lead Channel Pacing Threshold Pulse Width: 0.5 ms
Lead Channel Pacing Threshold Pulse Width: 0.8 ms
Lead Channel Sensing Intrinsic Amplitude: 1.9 mV
Lead Channel Sensing Intrinsic Amplitude: 12 mV
Lead Channel Setting Pacing Amplitude: 2 V
Lead Channel Setting Pacing Amplitude: 2.5 V
Lead Channel Setting Pacing Amplitude: 2.5 V
Lead Channel Setting Pacing Pulse Width: 0.5 ms
Lead Channel Setting Pacing Pulse Width: 0.8 ms
Lead Channel Setting Sensing Sensitivity: 4 mV
Pulse Gen Model: 3262
Pulse Gen Serial Number: 8995296

## 2019-08-01 LAB — CBC
HCT: 41.5 % (ref 36.0–46.0)
Hemoglobin: 12.9 g/dL (ref 12.0–15.0)
MCH: 26.5 pg (ref 26.0–34.0)
MCHC: 31.1 g/dL (ref 30.0–36.0)
MCV: 85.4 fL (ref 80.0–100.0)
Platelets: 354 10*3/uL (ref 150–400)
RBC: 4.86 MIL/uL (ref 3.87–5.11)
RDW: 14.1 % (ref 11.5–15.5)
WBC: 9.2 10*3/uL (ref 4.0–10.5)
nRBC: 0 % (ref 0.0–0.2)

## 2019-08-01 LAB — BASIC METABOLIC PANEL
Anion gap: 9 (ref 5–15)
BUN: 14 mg/dL (ref 8–23)
CO2: 25 mmol/L (ref 22–32)
Calcium: 8.9 mg/dL (ref 8.9–10.3)
Chloride: 107 mmol/L (ref 98–111)
Creatinine, Ser: 1.01 mg/dL — ABNORMAL HIGH (ref 0.44–1.00)
GFR calc Af Amer: 58 mL/min — ABNORMAL LOW (ref >60)
GFR calc non Af Amer: 50 mL/min — ABNORMAL LOW (ref >60)
Glucose, Bld: 99 mg/dL (ref 70–99)
Potassium: 5 mmol/L (ref 3.5–5.1)
Sodium: 141 mmol/L (ref 135–145)

## 2019-08-01 NOTE — Progress Notes (Signed)
Patient returns for ECG after increasing BB. ECG today shows V paced rhythm HR 70 underlying afib, QRS 174, QTc 529. Will plan for DCCV, check bmet/CBC. Follow up in the AF clinic one week post DCCV. Patient denies any missed doses of anticoagulation.

## 2019-08-01 NOTE — Patient Instructions (Signed)
Cardioversion scheduled for Tuesday, May 25th  - Arrive at the [facility] and go to admitting at [facility] not eat or drink anything after midnight the night prior to your procedure.  - Take all your morning medication with a sip of water prior to arrival.  - You will not be able to drive home after your procedure.

## 2019-08-02 NOTE — Progress Notes (Signed)
Remote pacemaker transmission.   

## 2019-08-06 ENCOUNTER — Other Ambulatory Visit (HOSPITAL_COMMUNITY)
Admission: RE | Admit: 2019-08-06 | Discharge: 2019-08-06 | Disposition: A | Discharge status: Home / Self Care | Payer: Medicare HMO | Source: Ambulatory Visit | Attending: Internal Medicine | Admitting: Internal Medicine

## 2019-08-06 DIAGNOSIS — Z20822 Contact with and (suspected) exposure to covid-19: Secondary | ICD-10-CM | POA: Insufficient documentation

## 2019-08-06 DIAGNOSIS — Z01812 Encounter for preprocedural laboratory examination: Secondary | ICD-10-CM | POA: Insufficient documentation

## 2019-08-06 LAB — SARS CORONAVIRUS 2 (TAT 6-24 HRS): SARS Coronavirus 2: NEGATIVE

## 2019-08-09 ENCOUNTER — Ambulatory Visit (HOSPITAL_COMMUNITY)
Admission: RE | Admit: 2019-08-09 | Discharge: 2019-08-09 | Disposition: A | Discharge status: Home / Self Care | Payer: Medicare HMO | Attending: Internal Medicine | Admitting: Internal Medicine

## 2019-08-09 ENCOUNTER — Encounter (HOSPITAL_COMMUNITY)
Admission: RE | Disposition: A | Discharge status: Home / Self Care | Payer: Self-pay | Source: Home / Self Care | Attending: Internal Medicine

## 2019-08-09 ENCOUNTER — Ambulatory Visit (HOSPITAL_COMMUNITY): Payer: Medicare HMO | Admitting: Certified Registered"

## 2019-08-09 ENCOUNTER — Encounter (HOSPITAL_COMMUNITY): Payer: Self-pay | Admitting: Internal Medicine

## 2019-08-09 DIAGNOSIS — I4891 Unspecified atrial fibrillation: Secondary | ICD-10-CM | POA: Diagnosis present

## 2019-08-09 DIAGNOSIS — I509 Heart failure, unspecified: Secondary | ICD-10-CM | POA: Diagnosis not present

## 2019-08-09 DIAGNOSIS — I4819 Other persistent atrial fibrillation: Secondary | ICD-10-CM | POA: Diagnosis not present

## 2019-08-09 DIAGNOSIS — D6869 Other thrombophilia: Secondary | ICD-10-CM | POA: Insufficient documentation

## 2019-08-09 DIAGNOSIS — Z96653 Presence of artificial knee joint, bilateral: Secondary | ICD-10-CM | POA: Diagnosis not present

## 2019-08-09 DIAGNOSIS — Z87891 Personal history of nicotine dependence: Secondary | ICD-10-CM | POA: Insufficient documentation

## 2019-08-09 DIAGNOSIS — Z7901 Long term (current) use of anticoagulants: Secondary | ICD-10-CM | POA: Diagnosis not present

## 2019-08-09 DIAGNOSIS — E785 Hyperlipidemia, unspecified: Secondary | ICD-10-CM | POA: Diagnosis not present

## 2019-08-09 DIAGNOSIS — I110 Hypertensive heart disease with heart failure: Secondary | ICD-10-CM | POA: Diagnosis not present

## 2019-08-09 DIAGNOSIS — I10 Essential (primary) hypertension: Secondary | ICD-10-CM | POA: Diagnosis not present

## 2019-08-09 DIAGNOSIS — Z950 Presence of cardiac pacemaker: Secondary | ICD-10-CM | POA: Insufficient documentation

## 2019-08-09 DIAGNOSIS — Z880 Allergy status to penicillin: Secondary | ICD-10-CM | POA: Insufficient documentation

## 2019-08-09 DIAGNOSIS — Z888 Allergy status to other drugs, medicaments and biological substances status: Secondary | ICD-10-CM | POA: Diagnosis not present

## 2019-08-09 DIAGNOSIS — I4892 Unspecified atrial flutter: Secondary | ICD-10-CM | POA: Diagnosis not present

## 2019-08-09 DIAGNOSIS — Z79899 Other long term (current) drug therapy: Secondary | ICD-10-CM | POA: Insufficient documentation

## 2019-08-09 DIAGNOSIS — I428 Other cardiomyopathies: Secondary | ICD-10-CM | POA: Insufficient documentation

## 2019-08-09 DIAGNOSIS — Z8249 Family history of ischemic heart disease and other diseases of the circulatory system: Secondary | ICD-10-CM | POA: Insufficient documentation

## 2019-08-09 DIAGNOSIS — M170 Bilateral primary osteoarthritis of knee: Secondary | ICD-10-CM | POA: Diagnosis not present

## 2019-08-09 DIAGNOSIS — K219 Gastro-esophageal reflux disease without esophagitis: Secondary | ICD-10-CM | POA: Insufficient documentation

## 2019-08-09 DIAGNOSIS — Z882 Allergy status to sulfonamides status: Secondary | ICD-10-CM | POA: Diagnosis not present

## 2019-08-09 HISTORY — PX: CARDIOVERSION: SHX1299

## 2019-08-09 SURGERY — CARDIOVERSION
Anesthesia: General

## 2019-08-09 MED ORDER — LIDOCAINE 2% (20 MG/ML) 5 ML SYRINGE
INTRAMUSCULAR | Status: DC | PRN
  Administered 2019-08-09: 40 mg via INTRAVENOUS

## 2019-08-09 MED ORDER — AMLODIPINE BESYLATE 5 MG PO TABS
5.0000 mg | ORAL_TABLET | Freq: Every day | ORAL | 11 refills | Status: DC
Start: 2019-08-09 — End: 2020-06-11

## 2019-08-09 MED ORDER — PROPOFOL 10 MG/ML IV BOLUS
INTRAVENOUS | Status: DC | PRN
  Administered 2019-08-09: 60 mg via INTRAVENOUS

## 2019-08-09 MED ORDER — SODIUM CHLORIDE 0.9 % IV SOLN: INTRAVENOUS | Status: DC

## 2019-08-09 NOTE — Progress Notes (Signed)
Patient stated that she missed two doses of Eliquis over the last couple of weeks but that she could not remember what she did with the missed doses. IV was removed and the cardioversion procedure was going to be cancelled until Dr. Debara Pickett spoke to daughter Maurine who assured there were no missed doses of Eliquis. Patient was taken back to the room and IV placed again to prepare for cardioversion.

## 2019-08-09 NOTE — Anesthesia Procedure Notes (Signed)
Procedure Name: MAC Date/Time: 08/09/2019 3:30 PM Performed by: Amadeo Garnet, CRNA Pre-anesthesia Checklist: Emergency Drugs available, Patient identified, Suction available and Patient being monitored Patient Re-evaluated:Patient Re-evaluated prior to induction Oxygen Delivery Method: Ambu bag Preoxygenation: Pre-oxygenation with 100% oxygen Induction Type: IV induction Placement Confirmation: positive ETCO2 Dental Injury: Teeth and Oropharynx as per pre-operative assessment

## 2019-08-09 NOTE — H&P (Signed)
   INTERVAL PROCEDURE H&P  History and Physical Interval Note:  08/09/2019 3:05 PM  Madison White has presented today for their planned procedure. The various methods of treatment have been discussed with the patient and family. After consideration of risks, benefits and other options for treatment, the patient has consented to the procedure.  The patients' outpatient history has been reviewed, patient examined, and no change in status from most recent office note within the past 30 days. I have reviewed the patients' chart and labs and will proceed as planned. Questions were answered to the patient's satisfaction.   Pixie Casino, MD, [facility], [place] Director of the Advanced Lipid Disorders &  Cardiovascular Risk Reduction Clinic Diplomate of the American Board of Clinical Lipidology Attending Cardiologist  Direct Dial: [phone]  Fax: [phone]  Website:  [email or web address] 08/09/2019, 3:05 PM

## 2019-08-09 NOTE — Anesthesia Preprocedure Evaluation (Addendum)
Anesthesia Evaluation  Patient identified by MRN, date of birth, ID band Patient awake    Reviewed: Allergy & Precautions, NPO status , Patient's Chart, lab work & pertinent test results  Airway Mallampati: II  TM Distance: >3 FB Neck ROM: Full    Dental  (+) Teeth Intact, Dental Advisory Given   Pulmonary former smoker,    breath sounds clear to auscultation       Cardiovascular hypertension,  Rhythm:Irregular Rate:Normal     Neuro/Psych    GI/Hepatic   Endo/Other    Renal/GU      Musculoskeletal   Abdominal   Peds  Hematology   Anesthesia Other Findings   Reproductive/Obstetrics                            Anesthesia Physical Anesthesia Plan  ASA: III  Anesthesia Plan: General   Post-op Pain Management:    Induction: Intravenous  PONV Risk Score and Plan:   Airway Management Planned: Mask  Additional Equipment:   Intra-op Plan:   Post-operative Plan:   Informed Consent: I have reviewed the patients History and Physical, chart, labs and discussed the procedure including the risks, benefits and alternatives for the proposed anesthesia with the patient or authorized representative who has indicated his/her understanding and acceptance.       Plan Discussed with: CRNA and Anesthesiologist  Anesthesia Plan Comments:         Anesthesia Quick Evaluation  

## 2019-08-09 NOTE — Discharge Instructions (Signed)
Electrical Cardioversion Electrical cardioversion is the delivery of a jolt of electricity to restore a normal rhythm to the heart. A rhythm that is too fast or is not regular keeps the heart from pumping well. In this procedure, sticky patches or metal paddles are placed on the chest to deliver electricity to the heart from a device. This procedure may be done in an emergency if:  There is low or no blood pressure as a result of the heart rhythm.  Normal rhythm must be restored as fast as possible to protect the brain and heart from further damage.  It may save a life. This may also be a scheduled procedure for irregular or fast heart rhythms that are not immediately life-threatening. Tell a health care provider about:  Any allergies you have.  All medicines you are taking, including vitamins, herbs, eye drops, creams, and over-the-counter medicines.  Any problems you or family members have had with anesthetic medicines.  Any blood disorders you have.  Any surgeries you have had.  Any medical conditions you have.  Whether you are pregnant or may be pregnant. What are the risks? Generally, this is a safe procedure. However, problems may occur, including:  Allergic reactions to medicines.  A blood clot that breaks free and travels to other parts of your body.  The possible return of an abnormal heart rhythm within hours or days after the procedure.  Your heart stopping (cardiac arrest). This is rare. What happens before the procedure? Medicines  Your health care provider may have you start taking: ? Blood-thinning medicines (anticoagulants) so your blood does not clot as easily. ? Medicines to help stabilize your heart rate and rhythm.  Ask your health care provider about: ? Changing or stopping your regular medicines. This is especially important if you are taking diabetes medicines or blood thinners. ? Taking medicines such as aspirin and ibuprofen. These medicines can  thin your blood. Do not take these medicines unless your health care provider tells you to take them. ? Taking over-the-counter medicines, vitamins, herbs, and supplements. General instructions  Follow instructions from your health care provider about eating or drinking restrictions.  Plan to have someone take you home from the hospital or clinic.  If you will be going home right after the procedure, plan to have someone with you for 24 hours.  Ask your health care provider what steps will be taken to help prevent infection. These may include washing your skin with a germ-killing soap. What happens during the procedure?   An IV will be inserted into one of your veins.  Sticky patches (electrodes) or metal paddles may be placed on your chest.  You will be given a medicine to help you relax (sedative).  An electrical shock will be delivered. The procedure may vary among health care providers and hospitals. What can I expect after the procedure?  Your blood pressure, heart rate, breathing rate, and blood oxygen level will be monitored until you leave the hospital or clinic.  Your heart rhythm will be watched to make sure it does not change.  You may have some redness on the skin where the shocks were given. Follow these instructions at home:  Do not drive for 24 hours if you were given a sedative during your procedure.  Take over-the-counter and prescription medicines only as told by your health care provider.  Ask your health care provider how to check your pulse. Check it often.  Rest for 48 hours after the procedure or   as told by your health care provider.  Avoid or limit your caffeine use as told by your health care provider.  Keep all follow-up visits as told by your health care provider. This is important. Contact a health care provider if:  You feel like your heart is beating too quickly or your pulse is not regular.  You have a serious muscle cramp that does not go  away. Get help right away if:  You have discomfort in your chest.  You are dizzy or you feel faint.  You have trouble breathing or you are short of breath.  Your speech is slurred.  You have trouble moving an arm or leg on one side of your body.  Your fingers or toes turn cold or blue. Summary  Electrical cardioversion is the delivery of a jolt of electricity to restore a normal rhythm to the heart.  This procedure may be done right away in an emergency or may be a scheduled procedure if the condition is not an emergency.  Generally, this is a safe procedure.  After the procedure, check your pulse often as told by your health care provider. This information is not intended to replace advice given to you by your health care provider. Make sure you discuss any questions you have with your health care provider. Document Revised: 10/04/2018 Document Reviewed: 10/04/2018 Elsevier Patient Education  2020 [employer]  

## 2019-08-09 NOTE — Anesthesia Postprocedure Evaluation (Signed)
Anesthesia Post Note  Patient: Madison White  Procedure(s) Performed: CARDIOVERSION (N/A )     Patient location during evaluation: Endoscopy Anesthesia Type: General Level of consciousness: awake and alert Pain management: pain level controlled Vital Signs Assessment: post-procedure vital signs reviewed and stable Respiratory status: spontaneous breathing, nonlabored ventilation, respiratory function stable and patient connected to nasal cannula oxygen Cardiovascular status: blood pressure returned to baseline and stable Postop Assessment: no apparent nausea or vomiting Anesthetic complications: no    Last Vitals:  Vitals:   08/09/19 1511 08/09/19 1541  BP: (!) 195/79 (!) 185/77  Resp:  (!) 9  Temp:  36.8 C  SpO2:  98%    Last Pain:  Vitals:   08/09/19 1541  TempSrc: Axillary  PainSc: 0-No pain                 Tika Hannis COKER

## 2019-08-09 NOTE — Transfer of Care (Signed)
Immediate Anesthesia Transfer of Care Note  Patient: Madison White  Procedure(s) Performed: CARDIOVERSION (N/A )  Patient Location: Endoscopy Unit  Anesthesia Type:MAC  Level of Consciousness: awake, alert  and oriented  Airway & Oxygen Therapy: Patient Spontanous Breathing  Post-op Assessment: Report given to RN, Post -op Vital signs reviewed and stable and Patient moving all extremities  Post vital signs: Reviewed and stable  Last Vitals:  Vitals Value Taken Time  BP    Temp    Pulse    Resp    SpO2      Last Pain:  Vitals:   08/09/19 1420  TempSrc: Oral         Complications: No apparent anesthesia complications

## 2019-08-09 NOTE — CV Procedure (Signed)
   CARDIOVERSION NOTE  Procedure: Electrical Cardioversion Indications:  Atrial Fibrillation  Procedure Details:  Consent: Risks of procedure as well as the alternatives and risks of each were explained to the (patient/caregiver).  Consent for procedure obtained.  Time Out: Verified patient identification, verified procedure, site/side was marked, verified correct patient position, special equipment/implants available, medications/allergies/relevent history reviewed, required imaging and test results available.  Performed  Patient placed on cardiac monitor, pulse oximetry, supplemental oxygen as necessary.  Sedation given: propofol per anesthesia Pacer pads placed anterior and posterior chest.  Cardioverted 1 time(s).  Cardioverted at 120J biphasic.  Impression: Findings: Post procedure EKG shows: Atrial sensed, v-paced Complications: None Patient did tolerate procedure well.  Plan: 1. Successful DCCV with a single 120J biphasic shock - rhythm appears to be a-sensed and Bi-V paced. 2. BP poorly controlled- start amlodipine 5 mg daily (she is intolerance of ACE-I or ARB). 3. Follow-up in afib clinic.  Time Spent Directly with the Patient:  60 minutes   Pixie Casino, MD, [facility], [place] Director of the [facility] Lip[facility]  Cardiovascular Risk Reduction Clinic Diplomate of the American Board of Clinical Lipidology Attending Cardiologist  Direct Dial: [phone]  Fax: [phone]  Website:  [email or web address] 08/09/2019, 3:42 PM

## 2019-08-10 ENCOUNTER — Ambulatory Visit (HOSPITAL_COMMUNITY): Payer: Medicare HMO | Admitting: Nurse Practitioner

## 2019-08-12 ENCOUNTER — Other Ambulatory Visit: Payer: Self-pay

## 2019-08-12 ENCOUNTER — Ambulatory Visit (INDEPENDENT_AMBULATORY_CARE_PROVIDER_SITE_OTHER): Payer: Medicare HMO | Admitting: Family Medicine

## 2019-08-12 ENCOUNTER — Ambulatory Visit (INDEPENDENT_AMBULATORY_CARE_PROVIDER_SITE_OTHER): Payer: Medicare HMO

## 2019-08-12 ENCOUNTER — Encounter: Payer: Self-pay | Admitting: Family Medicine

## 2019-08-12 VITALS — BP 138/78 | HR 59 | Temp 97.9°F | Ht 61.0 in | Wt 151.0 lb

## 2019-08-12 DIAGNOSIS — M533 Sacrococcygeal disorders, not elsewhere classified: Secondary | ICD-10-CM

## 2019-08-12 NOTE — Patient Instructions (Signed)
1) xray doesn't look like any acute issues, but radiologist will read it in next 24 hours.   2) pain is located over your si joint. I have a handout of exercises for you to try at home, if not getting better we can send to PT just let us know.   3) start voltaren gel over the counter and can put this on area up to four times a day.   4) I will sometimes do steroids, but if you are feeling back to normal im going to hold off on this right now.   So nice to meet you!  Dr. Rogers Blocker

## 2019-08-12 NOTE — Progress Notes (Signed)
Patient: Madison White MRN: [id number] DOB: April 09, 1928 PCP: Leamon Arnt, MD     Subjective:  Chief Complaint  Patient presents with  . Back Pain    HPI: The patient is a 86 y.o. female who presents today for back pain. She says that se feels better today. She says that can walk. This started about a month ago. Her son says that they took a trip to [facility] and she started complaining afterwards. Her son thinks traveling in the car may have been too much for her. She started to complain shortly after this. Pain is in lower back. She states it affects the top of her legs as well. No numbness or tingling going down her legs. She does feel weak, but it's hard to walk because of the pain. Pain rated as an 8/10. Described as stabbing in nature. It takes her breath away. She has been putting ice packs on her back and this has helped. Sitting tends to make it worse.   She felt great this Am when she woke up. She denies ay fever/chills. No urinary incontinence. She feels like she is back to baseline.   Review of Systems  Respiratory: Negative for cough, shortness of breath and wheezing.   Cardiovascular: Negative for chest pain and palpitations.  Musculoskeletal: Positive for back pain.  Neurological: Negative for dizziness, light-headedness and headaches.    Allergies Patient is allergic to penicillins; nickel; avapro [irbesartan]; lisinopril; simvastatin; latex; sulfa antibiotics; and sulfamethoxazole.  Past Medical History Patient  has a past medical history of Acute lower GI bleeding, Diastolic dysfunction, left ventricle, GERD (gastroesophageal reflux disease), Headache disorder (2016), History of blood transfusion, History of rheumatic fever, HTN (hypertension), Hyperlipidemia, Insulin resistance, Left bundle branch block (12/2011), LVH (left ventricular hypertrophy), Nonischemic cardiomyopathy ([place]) (2016), Osteoarthritis, Presence of biventricular cardiac pacemaker (04/05/2019),  Presence of permanent cardiac pacemaker (05/05/2017), Seasonal allergies, and Solitary pulmonary nodule (05/2016).  Surgical History Patient  has a past surgical history that includes Cesarean section (X 5); Strabismus surgery (1939); Abdominal hysterectomy (1963); Cardiovascular stress test (08/2005 & 12/2011); Cataract extraction (08/14/11); transthoracic echocardiogram (01/13/12); Carotid dopplers (11/22/14; 11/30/16); Cholecystectomy; right knee surgery (Right, 05/2016); Joint replacement (Bilateral); Tonsillectomy (1938); Bi-ventricular pacemaker insertion (crt-p) (05/05/2017); Insert / replace / remove pacemaker; BIV PACEMAKER INSERTION CRT-P (N/A, 05/05/2017); Colonoscopy with propofol (N/A, 09/02/2017); Hot hemostasis (N/A, 09/02/2017); and Cardioversion (N/A, 08/09/2019).  Family History Pateint's family history includes Cancer in her father; Diabetes in her mother; Heart disease in her father and mother; Hyperlipidemia in her mother; Hypertension in her mother.  Social History Patient  reports that she quit smoking about 61 years ago. She has never used smokeless tobacco. She reports current alcohol use of about 1.0 standard drinks of alcohol per week. She reports that she does not use drugs.    Objective: Vitals:   08/12/19 1420  BP: 138/78  Pulse: (!) 59  Temp: 97.9 F (36.6 C)  TempSrc: Temporal  SpO2: 97%  Weight: 151 lb (68.5 kg)  Height: 5\' 1"  (1.549 m)    Body mass index is 28.53 kg/m.  Physical Exam Vitals reviewed.  Constitutional:      General: She is not in acute distress.    Appearance: Normal appearance. She is not ill-appearing.  Pulmonary:     Effort: Pulmonary effort is normal.  Musculoskeletal:        General: Tenderness (no TTP over spinal processes of her back. TTP over right SI joint. she has strength, sensation intact in her  lower legs. gait normal. DTR normal ) present.  Neurological:     General: No focal deficit present.     Mental Status: She is alert  and oriented to person, place, and time.  Psychiatric:        Mood and Affect: Mood normal.        Behavior: Behavior normal.   lumbar xray: no acute findings. DDD. Official read pending.      Assessment/plan: 1. SI (sacroiliac) pain She is back to her baseline today and feeling better. We are going to do conservative therapy with home exercises for SI joint, voltaren gel, ice. Discussed referring to PT, but transportation will be tricky so they will let us know if the home exercises do not work or if pain returns so we can either refer to PT or see her back to see if further work up/imaging is warranted. Discussed steroids as well, but since she is back to her baseline I would rather hold off on this and do more conservative treatment.  - DG Lumbar Spine Complete; Future  Total time of encounter: 40 minutes total time of encounter, including 35 minutes spent in face-to-face patient care. This time includes coordination of care and counseling regarding acute complaints, review of xray, plan of care. Remainder of non-face-to-face time involved reviewing chart documents/testing relevant to the patient encounter and documentation in the medical record.   Return if symptoms worsen or fail to improve.   Orma Flaming, MD [place]   08/12/2019

## 2019-08-16 ENCOUNTER — Ambulatory Visit (HOSPITAL_COMMUNITY): Payer: Medicare HMO | Admitting: Nurse Practitioner

## 2019-08-16 ENCOUNTER — Telehealth: Payer: Self-pay | Admitting: Internal Medicine

## 2019-08-16 ENCOUNTER — Telehealth: Payer: Self-pay

## 2019-08-16 NOTE — Telephone Encounter (Signed)
Merlin alert received- Ongoing AF as of last transmission 08/14/19 at 0342.  Pt with recent DCCV on 5/25.    Spoke with pt granddaughter, she reports that pt was complaining of dizziness earlier.  She confirmed that pt has appt with AF clinic tomorrow.

## 2019-08-16 NOTE — Telephone Encounter (Signed)
New message  Patient c/o Palpitations:  High priority if patient c/o lightheadedness, shortness of breath, or chest pain  How long have you had palpitations/irregular HR/ Afib? Are you having the symptoms now? Yes, dizzy and lightheaded  1) Are you currently experiencing lightheadedness, SOB or CP? sob  2) Do you have a history of afib (atrial fibrillation) or irregular heart rhythm? Yes   3) Have you checked your BP or HR? (document readings if available): no   4) Are you experiencing any other symptoms? No

## 2019-08-16 NOTE — Telephone Encounter (Signed)
I spoke to the patient's daughter-in-law who called because the patient thinks that she is back in Afib and "just feels lousy".  She denies CP, but is SOB and lightheaded.   The patient is scheduled with Afib Clinic on 6/2, but the daughter-in-law, who is a [employer] scientist, knows to go to the ED, if symptoms worsen.  She is on her way to the patient's house to further assess.

## 2019-08-17 ENCOUNTER — Other Ambulatory Visit: Payer: Self-pay

## 2019-08-17 ENCOUNTER — Ambulatory Visit (HOSPITAL_COMMUNITY): Payer: Medicare HMO | Admitting: Nurse Practitioner

## 2019-08-17 ENCOUNTER — Ambulatory Visit (HOSPITAL_COMMUNITY)
Admit: 2019-08-17 | Discharge: 2019-08-17 | Disposition: A | Discharge status: Home / Self Care | Payer: PRIVATE HEALTH INSURANCE | Attending: Nurse Practitioner | Admitting: Nurse Practitioner

## 2019-08-17 VITALS — BP 150/62 | HR 64 | Ht 61.0 in | Wt 153.0 lb

## 2019-08-17 DIAGNOSIS — Z7901 Long term (current) use of anticoagulants: Secondary | ICD-10-CM | POA: Insufficient documentation

## 2019-08-17 DIAGNOSIS — I428 Other cardiomyopathies: Secondary | ICD-10-CM | POA: Diagnosis not present

## 2019-08-17 DIAGNOSIS — E785 Hyperlipidemia, unspecified: Secondary | ICD-10-CM | POA: Diagnosis not present

## 2019-08-17 DIAGNOSIS — D6869 Other thrombophilia: Secondary | ICD-10-CM

## 2019-08-17 DIAGNOSIS — Z87891 Personal history of nicotine dependence: Secondary | ICD-10-CM | POA: Insufficient documentation

## 2019-08-17 DIAGNOSIS — K219 Gastro-esophageal reflux disease without esophagitis: Secondary | ICD-10-CM | POA: Insufficient documentation

## 2019-08-17 DIAGNOSIS — Z79899 Other long term (current) drug therapy: Secondary | ICD-10-CM | POA: Diagnosis not present

## 2019-08-17 DIAGNOSIS — I10 Essential (primary) hypertension: Secondary | ICD-10-CM | POA: Insufficient documentation

## 2019-08-17 DIAGNOSIS — I480 Paroxysmal atrial fibrillation: Secondary | ICD-10-CM | POA: Insufficient documentation

## 2019-08-17 DIAGNOSIS — Z881 Allergy status to other antibiotic agents status: Secondary | ICD-10-CM | POA: Diagnosis not present

## 2019-08-17 DIAGNOSIS — Z880 Allergy status to penicillin: Secondary | ICD-10-CM | POA: Diagnosis not present

## 2019-08-17 DIAGNOSIS — Z950 Presence of cardiac pacemaker: Secondary | ICD-10-CM | POA: Diagnosis not present

## 2019-08-17 DIAGNOSIS — Z91040 Latex allergy status: Secondary | ICD-10-CM | POA: Diagnosis not present

## 2019-08-17 DIAGNOSIS — I4819 Other persistent atrial fibrillation: Secondary | ICD-10-CM | POA: Diagnosis not present

## 2019-08-17 DIAGNOSIS — I447 Left bundle-branch block, unspecified: Secondary | ICD-10-CM | POA: Diagnosis not present

## 2019-08-17 DIAGNOSIS — R911 Solitary pulmonary nodule: Secondary | ICD-10-CM | POA: Insufficient documentation

## 2019-08-17 DIAGNOSIS — Z8249 Family history of ischemic heart disease and other diseases of the circulatory system: Secondary | ICD-10-CM | POA: Diagnosis not present

## 2019-08-17 DIAGNOSIS — Z888 Allergy status to other drugs, medicaments and biological substances status: Secondary | ICD-10-CM | POA: Diagnosis not present

## 2019-08-18 ENCOUNTER — Encounter (HOSPITAL_COMMUNITY): Payer: Self-pay | Admitting: Nurse Practitioner

## 2019-08-18 ENCOUNTER — Telehealth: Payer: Self-pay | Admitting: Cardiovascular Disease

## 2019-08-18 NOTE — Progress Notes (Signed)
TY

## 2019-08-18 NOTE — Telephone Encounter (Signed)
New message   Called patient on both numbers, no answer, left vm to call back to schedule 3-4 week follow up with Dr. Loletha Grayer or an APP.     Staff Msg: Pt needs appt with dr C in next 3-4 weeks for follow up per donna carroll  Thanks  Marzetta Board

## 2019-08-18 NOTE — Progress Notes (Signed)
Primary Care Physician: Leamon Arnt, MD Referring Physician:Device clinic EP: Dr. Lovena Le  Cardiologist: Dr. Reynold Bowen Madison White is a 86 y.o. female with a h/o paroxysmal afib that was referred by the device clinic for her PPM showing presence of AF since 5/3. Pt reports that she is feeling more fatigued. V paced rhythm today at 70 bpm,by EKG,  BP at 160/74. She  is already on eliquis 5 mg bid for a CHA2DS2VASc score of at least 4. Also on BB.   F/u in the afib clinic, s/p successful cardioversion, 5/25. She  is in SR today. She  had a few hours of afib the other night as alerted by device clinic. She has a couple of glasses of wine a week. Her afib burden has been low so far. SHe had elevated of her BP at time of cardioversion and Dr. Debara Pickett placed her on amlodipine w9ith BP improved today at 150/62.    Today, she denies symptoms of palpitations, chest pain, shortness of breath, orthopnea, PND, lower extremity edema, dizziness, presyncope, syncope, or neurologic sequela. The patient is tolerating medications without difficulties and is otherwise without complaint today.   Past Medical History:  Diagnosis Date  . Acute lower GI bleeding   . Diastolic dysfunction, left ventricle   . GERD (gastroesophageal reflux disease)   . 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  [name] History of blood transfusion   . History of rheumatic fever   . HTN (hypertension)    hx of refusing treatment--White coat HTN and/or situational HTN (?)   . Hyperlipidemia    hx of refusing treatment  . Insulin resistance    A1c's excellent per old records (6.1 in 2008 and 2009)  . Left bundle branch block 12/2011   Dr. Sallyanne Kuster at [facility]; ECHO and myocardial perfusion scan showed   septal and apical wall motion abnormality but she had no significant valvular disease and no ischemia.  She did have mildly decreased EF (39% on lexiscan and 50% on echo) and abnl LV relaxation.  Mild amount of PVCs.  Pt at higher risk for other conduction abnormalities, good chance of eventually requiring a pacemaker.   [name] LVH (left ventricular hypertrophy)   . Nonischemic cardiomyopathy ([place]) 2016   LV dysfunction due to LBBB/septal dyssynchrony  . Osteoarthritis    bilat knees--needs bilat TKA.  Ortho is trying steroid injections as of 10/23/15.  . Presence of biventricular cardiac pacemaker 04/05/2019  . Presence of permanent cardiac pacemaker 05/05/2017  . Seasonal allergies   . Solitary pulmonary nodule 05/2016   8 mm pleural based nodule in RLL.  Radiology recommended f/u noncontrast CT in 6-12 mo.   Past Surgical History:  Procedure Laterality Date  . ABDOMINAL HYSTERECTOMY  1963   At the time of her last C/S; says she had partial bladder resection at that time as well  . BI-VENTRICULAR PACEMAKER INSERTION (CRT-P)  05/05/2017  . BIV PACEMAKER INSERTION CRT-P N/A 05/05/2017   Procedure: BIV PACEMAKER INSERTION CRT-P;  Surgeon: Evans Lance, MD;  Location: [facility];  Service: Cardiovascular;  Laterality: N/A;  . CARDIOVASCULAR STRESS TEST  08/2005 & 12/2011   Low risk scans (on the 12/2011 scan she did have EF 39% with  moderately severe LV dysfunction with septal dyssynergy probably contributed by LBBB  . CARDIOVERSION N/A 08/09/2019   Procedure: CARDIOVERSION;  Surgeon: Pixie Casino, MD;  Location: [facility] ENDOSCOPY;  Service: Cardiovascular;  Laterality: N/A;  . Carotid dopplers  11/22/14; 11/30/16   NORMAL 2016 and 2018  . CATARACT EXTRACTION  08/14/11   both  . CESAREAN SECTION  X 5   One of her neonates died soon after birth  . CHOLECYSTECTOMY     1980's  . COLONOSCOPY WITH PROPOFOL N/A 09/02/2017   Procedure: COLONOSCOPY WITH PROPOFOL;  Surgeon: Ladene Artist, MD;   Location[facility] ENDOSCOPY;  Service: Endoscopy;  Laterality: N/A;  . HOT HEMOSTASIS N/A 09/02/2017   Procedure: HOT HEMOSTASIS (ARGON PLASMA COAGULATION/BICAP);  Surgeon: Ladene Artist, MD;  Location: [facility] ENDOSCOPY;  Service: Endoscopy;  Laterality: N/A;  . INSERT / REPLACE / REMOVE PACEMAKER    . JOINT REPLACEMENT Bilateral    knee  . right knee surgery Right 05/2016   ? R TKA: no records.  [name] STRABISMUS SURGERY  1939  . TONSILLECTOMY  1938  . TRANSTHORACIC ECHOCARDIOGRAM  01/13/12   Septal dyssynergy, EF 50%, LV relaxation impaired.  No significant valvular abnormalities.    Current Outpatient Medications  Medication Sig Dispense Refill  . amLODipine (NORVASC) 5 MG tablet Take 1 tablet (5 mg total) by mouth daily. 30 tablet 11  . cetirizine (ZYRTEC) 10 MG tablet Take 5 mg by mouth in the morning and at bedtime.    . Cholecalciferol (VITAMIN D3 PO) Take 1,000 mg by mouth daily.    [name] ELIQUIS 5 MG TABS tablet TAKE 1 TABLET BY MOUTH TWICE A DAY (Patient taking differently: Take 5 mg by mouth 2 (two) times daily. ) 180 tablet 0  . metoprolol tartrate (LOPRESSOR) 25 MG tablet Take 1.5 tablets (37.5 mg total) by mouth 2 (two) times daily. 180 tablet 0  . Multiple Vitamin (MULTIVITAMIN) tablet Take 1 tablet by mouth daily.    [name] omeprazole (PRILOSEC) 20 MG capsule TAKE 1 CAPSULE BY MOUTH EVERY DAY (Patient taking differently: Take 20 mg by mouth daily. ) 90 capsule 1  . pyridOXINE (VITAMIN B-6) 100 MG tablet Take 100 mg by mouth daily.    . vitamin B-12 (CYANOCOBALAMIN) 1000 MCG tablet Take 1,000 mcg by mouth daily.    . vitamin E (VITAMIN E) 180 MG (400 UNITS) capsule Take 400 Units by mouth daily.    . fexofenadine (ALLEGRA) 180 MG tablet Take 180 mg by mouth daily.     No current facility-administered medications for this encounter.    Allergies  Allergen Reactions  . Penicillins Hives and Swelling    Has patient had a PCN reaction causing immediate rash, facial/tongue/throat swelling, SOB  or lightheadedness with hypotension: Yes Has patient had a PCN reaction causing severe rash involving mucus membranes or skin necrosis: No Has patient had a PCN reaction that required hospitalization: No Has patient had a PCN reaction occurring within the last 10 years: No If all of the above answers are "NO", then may proceed with Cephalosporin use.   . Nickel Swelling and Rash  . Avapro [Irbesartan] Other (See Comments)    Unknown rxn  . Lisinopril Swelling    Swelling around eyes; also says it caused increased sugar and BP  . Simvastatin Other (See Comments)    unknown  . Latex Rash  . Sulfa Antibiotics Rash  . Sulfamethoxazole Rash    Social History   Socioeconomic History  . Marital  status: Widowed    Spouse name: Not on file  . Number of children: Not on file  . Years of education: Not on file  . Highest education level: Not on file  Occupational History  . Not on file  Tobacco Use  . Smoking status: Former Smoker    Quit date: 05/31/1958    Years since quitting: 61.2  . Smokeless tobacco: Never Used  Substance and Sexual Activity  . Alcohol use: Yes    Alcohol/week: 1.0 standard drinks    Types: 1 Glasses of wine per week  . Drug use: No  . Sexual activity: Not on file  Other Topics Concern  . Not on file  Social History Narrative   Widower, husband died around 71 (wrongful death per pt's report).   Originally from [place], has been in [place] since about 1997.   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   Distant hx of tobacco abuse.  No alcohol or drugs.   Social Determinants of Health   Financial Resource Strain:   . Difficulty of Paying Living Expenses:   Food Insecurity:   . Worried About Charity fundraiser in the Last Year:   . Arboriculturist in the Last Year:   Transportation Needs:   . Film/video editor (Medical):   [name] Lack of Transportation (Non-Medical):   Physical Activity:   . Days of  Exercise per Week:   . Minutes of Exercise per Session:   Stress:   . Feeling of Stress :   Social Connections:   . Frequency of Communication with Friends and Family:   . Frequency of Social Gatherings with Friends and Family:   . Attends Religious Services:   . Active Member of Clubs or Organizations:   . Attends Archivist Meetings:   [name] Marital Status:   Intimate Partner Violence:   . Fear of Current or Ex-Partner:   . Emotionally Abused:   [name] Physically Abused:   . Sexually Abused:     Family History  Problem Relation Age of Onset  . Hypertension Mother   . Heart disease Mother   . Hyperlipidemia Mother   . Diabetes Mother   . Heart disease Father   . Cancer Father     ROS- All systems are reviewed and negative except as per the HPI above  Physical Exam: Vitals:   08/17/19 1413  BP: (!) 150/62  Pulse: 64  Weight: 69.4 kg  Height: 5\' 1"  (1.549 m)   Wt Readings from Last 3 Encounters:  08/17/19 69.4 kg  08/12/19 68.5 kg  08/09/19 69.3 kg    Labs: Lab Results  Component Value Date   NA 141 08/01/2019   K 5.0 08/01/2019   CL 107 08/01/2019   CO2 25 08/01/2019   GLUCOSE 99 08/01/2019   BUN 14 08/01/2019   CREATININE 1.01 (H) 08/01/2019   CALCIUM 8.9 08/01/2019   MG 2.0 09/01/2017   Lab Results  Component Value Date   INR 1.08 08/31/2017   Lab Results  Component Value Date   CHOL 207 (H) 04/05/2019   HDL 66.20 04/05/2019   LDLCALC 111 (H) 01/07/2016   TRIG 207.0 (H) 04/05/2019     GEN- The patient is well appearing, alert and oriented x 3 today.   Head- normocephalic, atraumatic Eyes-  Sclera clear, conjunctiva pink Ears- hearing intact Oropharynx- clear Neck- supple, no JVP Lymph- no cervical lymphadenopathy Lungs- Clear to  ausculation bilaterally, normal work of breathing Heart- Regular rate and rhythm, no murmurs, rubs or gallops, PMI not laterally displaced GI- soft, NT, ND, + BS Extremities- no clubbing, cyanosis, or  edema MS- no significant deformity or atrophy Skin- no rash or lesion Psych- euthymic mood, full affect Neuro- strength and sensation are intact  EKG-a sensed v paced rhythm at 64 bpm, qrs int 174 ms, qrs int 511 ms    Assessment and Plan: 1. AFib Present on device since 5/3 Associated with some fatigue Was successfully cardioverted   Continue  Metoprolol at 1 1/2 bid May want to avoid alcohol, may be a trigger   2. CHA2DS2VASc score of 4 Continue eliquis 5 mg bid   3. HTN Recently started on amlodipine 5 mg at time of cardioversion with BP around 99991111 systolic that day Improved today   F/u with Dr. Lurline Del office within the month    Butch Penny C. Aul Mangieri, [facility] [address] [place], [place] [address] [phone]

## 2019-08-19 ENCOUNTER — Other Ambulatory Visit: Payer: Self-pay

## 2019-08-19 DIAGNOSIS — M533 Sacrococcygeal disorders, not elsewhere classified: Secondary | ICD-10-CM

## 2019-08-29 ENCOUNTER — Other Ambulatory Visit: Payer: Self-pay

## 2019-08-29 DIAGNOSIS — M533 Sacrococcygeal disorders, not elsewhere classified: Secondary | ICD-10-CM

## 2019-09-05 ENCOUNTER — Ambulatory Visit: Payer: PRIVATE HEALTH INSURANCE | Admitting: Physical Therapy

## 2019-09-15 ENCOUNTER — Ambulatory Visit: Payer: Medicare HMO | Admitting: Physician Assistant

## 2019-09-17 ENCOUNTER — Other Ambulatory Visit: Payer: Self-pay | Admitting: Family Medicine

## 2019-10-17 ENCOUNTER — Telehealth: Payer: Self-pay | Admitting: *Deleted

## 2019-10-17 NOTE — Telephone Encounter (Signed)
LMOVM for Cyril Mourning [facility]) requesting call back to DC. Direct number and office hours provided.  Merlin alert received for AF ongoing since ~10/15/19, though increasing burden noted since 10/12/19. V rates controlled. S/P DCCV on 08/09/19. Pt overdue for f/u with Dr. Sallyanne Kuster or APP per phone note from 08/18/19. Will discuss symptoms, confirm compliance with Eliquis, and arrange for f/u.

## 2019-10-18 ENCOUNTER — Telehealth: Payer: Self-pay

## 2019-10-18 NOTE — Telephone Encounter (Signed)
No change in treatment since she is asymptomatic, rate controlled and on anticoagulants. Thanks!

## 2019-10-18 NOTE — Telephone Encounter (Signed)
The pt granddaughter Madison White was returning Madison Czar, rn phone call. I let her know per Dr. Sallyanne Kuster phone note Croitoru, Dani Gobble, MD     12:35 PM Note   No change in treatment since she is asymptomatic, rate controlled and on anticoagulants. Thanks!     I told the pt if the nurse had any further questions then the nurse will give her a call back.

## 2019-10-18 NOTE — Telephone Encounter (Signed)
Spoke to patients granddaughter Cyril Mourning, [place]). States she spoke to patient and patient has no complaints. States patient has been compliant with medications including [place]. Per Cyril Mourning patient has expressed that she does not want anymore shock or major therapies to keep her out of AF. Reports of pain free and wants to enjoy her time here. Cyril Mourning states the follow-up apt. Was canceled due to patient feeling fine and does not want more therapy regarding shocks. Lindaann Pascal I will forward this information to Dr. Sallyanne Kuster. Advised if she had any further questions to please call DC or [facility]. Agreeable.

## 2019-10-18 NOTE — Telephone Encounter (Signed)
Cyril Mourning is returning [place] phone call. I let her speak with Marliss Czar, Rolanda Lundborg is on the dpr.

## 2019-10-18 NOTE — Telephone Encounter (Signed)
Attempted to call granddaughter Cyril Mourning for update from Dr. Loletha Grayer. LMOVM.

## 2019-10-24 ENCOUNTER — Emergency Department (HOSPITAL_BASED_OUTPATIENT_CLINIC_OR_DEPARTMENT_OTHER)
Admission: EM | Admit: 2019-10-24 | Discharge: 2019-10-25 | Disposition: A | Discharge status: Home / Self Care | Payer: Medicare HMO | Attending: Emergency Medicine | Admitting: Emergency Medicine

## 2019-10-24 ENCOUNTER — Other Ambulatory Visit: Payer: Self-pay

## 2019-10-24 ENCOUNTER — Telehealth: Payer: Self-pay

## 2019-10-24 ENCOUNTER — Encounter (HOSPITAL_BASED_OUTPATIENT_CLINIC_OR_DEPARTMENT_OTHER): Payer: Self-pay | Admitting: *Deleted

## 2019-10-24 DIAGNOSIS — L02214 Cutaneous abscess of groin: Secondary | ICD-10-CM | POA: Diagnosis not present

## 2019-10-24 DIAGNOSIS — B9689 Other specified bacterial agents as the cause of diseases classified elsewhere: Secondary | ICD-10-CM | POA: Diagnosis not present

## 2019-10-24 DIAGNOSIS — Z5321 Procedure and treatment not carried out due to patient leaving prior to being seen by health care provider: Secondary | ICD-10-CM | POA: Diagnosis not present

## 2019-10-24 NOTE — ED Triage Notes (Signed)
Abscess right groin x 3 days.

## 2019-10-24 NOTE — Telephone Encounter (Signed)
The pt granddaughter Madison White wanted to know if the pt should take her at home monitor with her out of town. I answered yes. She asked if she should take her [employer] ID card? I told her she should have that handy. If they can not find the card to call [employer] tech support.

## 2019-10-25 ENCOUNTER — Telehealth: Payer: Self-pay

## 2019-10-25 ENCOUNTER — Ambulatory Visit: Payer: Medicare HMO | Admitting: Physician Assistant

## 2019-10-25 ENCOUNTER — Encounter: Payer: Self-pay | Admitting: Family Medicine

## 2019-10-25 ENCOUNTER — Ambulatory Visit (INDEPENDENT_AMBULATORY_CARE_PROVIDER_SITE_OTHER): Payer: PRIVATE HEALTH INSURANCE | Admitting: Family Medicine

## 2019-10-25 VITALS — Temp 98.0°F | Wt 155.2 lb

## 2019-10-25 DIAGNOSIS — I428 Other cardiomyopathies: Secondary | ICD-10-CM

## 2019-10-25 DIAGNOSIS — R4182 Altered mental status, unspecified: Secondary | ICD-10-CM | POA: Diagnosis not present

## 2019-10-25 DIAGNOSIS — I4891 Unspecified atrial fibrillation: Secondary | ICD-10-CM | POA: Diagnosis not present

## 2019-10-25 DIAGNOSIS — R197 Diarrhea, unspecified: Secondary | ICD-10-CM

## 2019-10-25 DIAGNOSIS — I959 Hypotension, unspecified: Secondary | ICD-10-CM | POA: Diagnosis not present

## 2019-10-25 DIAGNOSIS — Z950 Presence of cardiac pacemaker: Secondary | ICD-10-CM | POA: Diagnosis not present

## 2019-10-25 DIAGNOSIS — Z8679 Personal history of other diseases of the circulatory system: Secondary | ICD-10-CM

## 2019-10-25 DIAGNOSIS — I10 Essential (primary) hypertension: Secondary | ICD-10-CM

## 2019-10-25 DIAGNOSIS — L02214 Cutaneous abscess of groin: Secondary | ICD-10-CM | POA: Diagnosis not present

## 2019-10-25 DIAGNOSIS — R55 Syncope and collapse: Secondary | ICD-10-CM | POA: Diagnosis not present

## 2019-10-25 DIAGNOSIS — R0602 Shortness of breath: Secondary | ICD-10-CM | POA: Diagnosis not present

## 2019-10-25 DIAGNOSIS — R531 Weakness: Secondary | ICD-10-CM | POA: Diagnosis not present

## 2019-10-25 DIAGNOSIS — R0902 Hypoxemia: Secondary | ICD-10-CM | POA: Diagnosis not present

## 2019-10-25 MED ORDER — CEPHALEXIN 500 MG PO CAPS: 500.0000 mg | ORAL_CAPSULE | Freq: Two times a day (BID) | ORAL | 0 refills | Status: DC

## 2019-10-25 NOTE — Telephone Encounter (Signed)
Notified Cyril Mourning that patient's transmission showed AT/AF episodes that resemble previous episodes. + Xarelto. N,V, & diarrhea x 3 days. Patient at PCP at this time.

## 2019-10-25 NOTE — Telephone Encounter (Signed)
The pt granddaughter Cyril Mourning who is on the dpr, sent a transmission last night and would like for it to be reviewed. She states the pt is pale, vomiting, and diarrhea this morning. She called the ambulance and they said her vitals are fine. She has a 9 am appointment with her primary care this morning. She would like a call back. Her phone number is [phone].

## 2019-10-25 NOTE — Progress Notes (Signed)
Subjective  CC:  Chief Complaint  Patient presents with  . Fall    3 weeks ago, reoccuring headaches, fatigue/weakness, possible dehydration    HPI: Madison White is a 86 y.o. female who presents to the office today to address the problems listed above in the chief complaint.  86 year old female presents with both of her daughters due to feeling poorly over the weekend.  She describes several episodes of loose watery diarrhea without blood or mucus.  She denies abdominal pain.  This morning she experienced an episode of lightheadedness while in the shower.  She felt like she was going to pass out.  She denies chest pain or palpitations.  She called her daughter who came to assist her.  She suddenly vomited.  They laid her down and she is feeling better now.  She also has been complaining of right-sided sharp headaches more frequently since her fall 3 weeks ago.  Patient was getting up out of bed, was kind of confused, thought her daughter was standing in front of her, reached for her and fell forward.  She hit her head on a dresser and landed on the floor.  Had a small contusion of her right arm.  Her daughter says her mental status has been at her baseline since.  She complains of these intermittent headaches and has been doing so for the last 4 to 5 years.  There is no change in description of her headaches.  She denies diplopia, dysarthria, paresis.  She had no palpitations, chest pain or shortness of breath prior to her fall.  Her daughters do not believe that she drinks a lot during the day.  She tends to get headaches when she is dehydrated as well.  She has had no fevers, chills or change in her appetite.  She is eating well.  She has not complained of nausea.  She has not had any exposure to ill contacts.  No URI symptoms.  No urinary symptoms.  History of A. fib status post cardioversion several months ago.  At that time her beta-blocker dose was increased from 25 twice daily to 37.5 mg  twice daily.  Her blood pressures at home have been running low.  She has been rate controlled.  EMS showed positive orthostatic hypotension this a.m.  Blood pressures running low here in the office today.  She is also on amlodipine 5 mg daily.  Reviewed her most recent cardiology visits and hospitalization for cardioversion.  She denies bleeding.  Right abscess: Daughter reports an infection in her right groin that has been tender.  No fevers or chills.  Assessment  1. Vasovagal episode   2. HTN (hypertension), benign   3. History of complete heart block   4. Presence of biventricular cardiac pacemaker   5. Nonischemic cardiomyopathy ([place])   6. Diarrhea of presumed infectious origin   7. Abscess of right groin      Plan    Hypertension f/u: In the setting of a possible vasovagal episode and orthostatic hypotension, will cut back beta-blocker to 25 mg twice daily monitor for palpitations and monitor her blood pressure.  We can also consider adjusting amlodipine if needed.  Clinically she looks euvolemic.  Lightheadedness, diarrhea and vomiting: Sounds vasovagal this morning but could be having a viral gastroenteritis or other.  Check lab work.  She has a benign abdomen today.  She has a good appetite.  Continue working on good oral hydration.  Confusion: Reportedly at baseline.  Recommend office  visit to further address and consider medications.  Check for urinary infection today.  Right groin abscess, mild: More like a blood blister.  She is on anticoagulation.  No indication for drainage today.  Cover with Keflex twice daily x7 days.  Education regarding management of these chronic disease states was given. Management strategies discussed on successive visits include dietary and exercise recommendations, goals of achieving and maintaining IBW, and lifestyle modifications aiming for adequate sleep and minimizing stressors.   Follow up: 3 weeks for recheck  Orders Placed This Encounter    Procedures  . Urine Culture  . CBC with Differential/Platelet  . Comprehensive metabolic panel  . Lipid panel  . TSH  . B Nat Peptide  . Urinalysis, Routine w reflex microscopic   Meds ordered this encounter  Medications  . cephALEXin (KEFLEX) 500 MG capsule    Sig: Take 1 capsule (500 mg total) by mouth 2 (two) times daily.    Dispense:  14 capsule    Refill:  0      BP Readings from Last 3 Encounters:  10/24/19 (!) 111/98  08/17/19 (!) 150/62  08/12/19 138/78   Wt Readings from Last 3 Encounters:  10/25/19 155 lb 3.2 oz (70.4 kg)  10/24/19 153 lb (69.4 kg)  08/17/19 153 lb (69.4 kg)    Lab Results  Component Value Date   CHOL 207 (H) 04/05/2019   CHOL 194 01/07/2016   CHOL 208 (H) 05/29/2011   Lab Results  Component Value Date   HDL 66.20 04/05/2019   HDL 58.40 01/07/2016   HDL 70.40 05/29/2011   Lab Results  Component Value Date   LDLCALC 111 (H) 01/07/2016   Lab Results  Component Value Date   TRIG 207.0 (H) 04/05/2019   TRIG 121.0 01/07/2016   TRIG 93.0 05/29/2011   Lab Results  Component Value Date   CHOLHDL 3 04/05/2019   CHOLHDL 3 01/07/2016   CHOLHDL 3 05/29/2011   Lab Results  Component Value Date   LDLDIRECT 125.0 04/05/2019   LDLDIRECT 117.9 05/29/2011   Lab Results  Component Value Date   CREATININE 1.01 (H) 08/01/2019   BUN 14 08/01/2019   NA 141 08/01/2019   K 5.0 08/01/2019   CL 107 08/01/2019   CO2 25 08/01/2019    The ASCVD Risk score (Goff DC Jr., et al., 2013) failed to calculate for the following reasons:   The 2013 ASCVD risk score is only valid for ages 32 to 63  I reviewed the patients updated PMH, FH, and SocHx.    Patient Active Problem List   Diagnosis Date Noted  . Persistent atrial fibrillation ([place]) 08/01/2019  . Secondary hypercoagulable state ([place]) 08/01/2019  . Presence of biventricular cardiac pacemaker 04/05/2019  . History of complete heart block 05/05/2017  . Renal cyst 03/01/2017  .  Hydronephrosis with urinary obstruction due to renal calculus 03/01/2017  . Total knee replacement status, bilateral 11/06/2016  . Nonischemic cardiomyopathy ([place]) 11/17/2014  . Upper airway cough syndrome 02/26/2014  . Constipation, chronic 02/26/2014  . GERD (gastroesophageal reflux disease) 11/19/2012  . Dyslipidemia 09/02/2012  . Left bundle branch block 12/18/2011  . HTN (hypertension), benign 05/31/2011    Allergies: Penicillins, Nickel, Avapro [irbesartan], Lisinopril, Simvastatin, Latex, Sulfa antibiotics, and Sulfamethoxazole  Social History: Patient  reports that she quit smoking about 61 years ago. She has never used smokeless tobacco. She reports current alcohol use of about 1.0 standard drink of alcohol per week. She reports that she does not  use drugs.  Current Meds  Medication Sig  . amLODipine (NORVASC) 5 MG tablet Take 1 tablet (5 mg total) by mouth daily.  . cetirizine (ZYRTEC) 10 MG tablet Take 5 mg by mouth in the morning and at bedtime.  [name] ELIQUIS 5 MG TABS tablet TAKE 1 TABLET BY MOUTH TWICE A DAY (Patient taking differently: Take 5 mg by mouth 2 (two) times daily. )  . fexofenadine (ALLEGRA) 180 MG tablet Take 180 mg by mouth daily.  . metoprolol tartrate (LOPRESSOR) 25 MG tablet TAKE 1 TABLET BY MOUTH TWICE A DAY  . omeprazole (PRILOSEC) 20 MG capsule TAKE 1 CAPSULE BY MOUTH EVERY DAY    Review of Systems: Cardiovascular: negative for chest pain, palpitations, leg swelling, orthopnea Respiratory: negative for SOB, wheezing or persistent cough Gastrointestinal: negative for abdominal pain Genitourinary: negative for dysuria or gross hematuria  Objective  Vitals: Temp 98 F (36.7 C) (Temporal)   Wt 155 lb 3.2 oz (70.4 kg)   SpO2 98%   BMI 29.32 kg/m  General: no acute distress  Psych:  Alert and oriented, normal mood and affect HEENT:  Normocephalic, atraumatic, supple neck  Cardiovascular:  RRR systolic murmur present, no edema  respiratory:  Good  breath sounds bilaterally, CTAB with normal respiratory effort, no rales Skin:  Warm, no rashes, right groin with 1 cm blood blister with surrounding erythema, nontender, no fluctuance, no warmth Neurologic:   Mental status is normal, some confusion present.  Commons side effects, risks, benefits, and alternatives for medications and treatment plan prescribed today were discussed, and the patient expressed understanding of the given instructions. Patient is instructed to call or message via MyChart if he/she has any questions or concerns regarding our treatment plan. No barriers to understanding were identified. We discussed Red Flag symptoms and signs in detail. Patient expressed understanding regarding what to do in case of urgent or emergency type symptoms.   Medication list was reconciled, printed and provided to the patient in AVS. Patient instructions and summary information was reviewed with the patient as documented in the AVS. This note was prepared with assistance of Dragon voice recognition software. Occasional wrong-word or sound-a-like substitutions may have occurred due to the inherent limitations of voice recognition software  This visit occurred during the SARS-CoV-2 public health emergency.  Safety protocols were in place, including screening questions prior to the visit, additional usage of staff PPE, and extensive cleaning of exam room while observing appropriate contact time as indicated for disinfecting solutions.

## 2019-10-25 NOTE — Patient Instructions (Addendum)
Please return in 2-3 weeks for recheck.  Please decrease the lopressor (metoprolol) back down to 25mg  (one tab) twice a day. Monitor her blood pressures and her symptoms of light headedness. IF the blood pressures remain low, we would decrease the amlodipine down to 2.5 (1/2 tab) a day next. Keep hydrated.   I will release your lab results to you on your MyChart account with further instructions. Please reply with any questions.   If you have any questions or concerns, please don't hesitate to send me a message via MyChart or call the office at [phone]. Thank you for visiting with [place] today! It's our pleasure caring for you.

## 2019-10-25 NOTE — Telephone Encounter (Signed)
Pt.s Granddaughter called and asked that if she is so dehydrated as Dr.Andy states, that she be given IV fluids. She says if we can not give it , can we order fluids and piping so she can administer them, as she is a Marine scientist. Please advise.

## 2019-10-26 LAB — URINALYSIS, ROUTINE W REFLEX MICROSCOPIC
Bilirubin Urine: NEGATIVE
Hgb urine dipstick: NEGATIVE
Ketones, ur: NEGATIVE
Nitrite: NEGATIVE
Specific Gravity, Urine: 1.028 (ref 1.001–1.03)
pH: <=5 (ref 5.0–8.0)

## 2019-10-26 LAB — CBC WITH DIFFERENTIAL/PLATELET
Absolute Monocytes: 1092 cells/uL — ABNORMAL HIGH (ref 200–950)
Basophils Absolute: 56 cells/uL (ref 0–200)
Basophils Relative: 0.4 %
Eosinophils Absolute: 84 cells/uL (ref 15–500)
Eosinophils Relative: 0.6 %
HCT: 32.9 % — ABNORMAL LOW (ref 35.0–45.0)
Hemoglobin: 10.4 g/dL — ABNORMAL LOW (ref 11.7–15.5)
Lymphs Abs: 1610 cells/uL (ref 850–3900)
MCH: 24.7 pg — ABNORMAL LOW (ref 27.0–33.0)
MCHC: 31.6 g/dL — ABNORMAL LOW (ref 32.0–36.0)
MCV: 78.1 fL — ABNORMAL LOW (ref 80.0–100.0)
MPV: 10.4 fL (ref 7.5–12.5)
Monocytes Relative: 7.8 %
Neutro Abs: 11158 cells/uL — ABNORMAL HIGH (ref 1500–7800)
Neutrophils Relative %: 79.7 %
Platelets: 285 10*3/uL (ref 140–400)
RBC: 4.21 10*6/uL (ref 3.80–5.10)
RDW: 14.5 % (ref 11.0–15.0)
Total Lymphocyte: 11.5 %
WBC: 14 10*3/uL — ABNORMAL HIGH (ref 3.8–10.8)

## 2019-10-26 LAB — COMPREHENSIVE METABOLIC PANEL
AG Ratio: 1.2 (calc) (ref 1.0–2.5)
ALT: 6 U/L (ref 6–29)
AST: 12 U/L (ref 10–35)
Albumin: 3.8 g/dL (ref 3.6–5.1)
Alkaline phosphatase (APISO): 57 U/L (ref 37–153)
BUN/Creatinine Ratio: 12 (calc) (ref 6–22)
BUN: 12 mg/dL (ref 7–25)
CO2: 27 mmol/L (ref 20–32)
Calcium: 9.1 mg/dL (ref 8.6–10.4)
Chloride: 102 mmol/L (ref 98–110)
Creat: 0.99 mg/dL — ABNORMAL HIGH (ref 0.60–0.88)
Globulin: 3.2 g/dL (calc) (ref 1.9–3.7)
Glucose, Bld: 175 mg/dL — ABNORMAL HIGH (ref 65–99)
Potassium: 3.7 mmol/L (ref 3.5–5.3)
Sodium: 137 mmol/L (ref 135–146)
Total Bilirubin: 0.6 mg/dL (ref 0.2–1.2)
Total Protein: 7 g/dL (ref 6.1–8.1)

## 2019-10-26 LAB — URINE CULTURE
MICRO NUMBER:: 10808233
SPECIMEN QUALITY:: ADEQUATE

## 2019-10-26 LAB — LIPID PANEL
Cholesterol: 181 mg/dL (ref <200)
HDL: 57 mg/dL (ref >=50)
LDL Cholesterol (Calc): 103 mg/dL (calc) — ABNORMAL HIGH
Non-HDL Cholesterol (Calc): 124 mg/dL (calc) (ref <130)
Total CHOL/HDL Ratio: 3.2 (calc) (ref <5.0)
Triglycerides: 112 mg/dL (ref <150)

## 2019-10-26 LAB — TSH: TSH: 3.48 mIU/L (ref 0.40–4.50)

## 2019-10-26 LAB — BRAIN NATRIURETIC PEPTIDE: Brain Natriuretic Peptide: 216 pg/mL — ABNORMAL HIGH (ref <100)

## 2019-10-26 NOTE — Telephone Encounter (Signed)
See result note instructions; pt has been notified.

## 2019-10-27 ENCOUNTER — Telehealth (INDEPENDENT_AMBULATORY_CARE_PROVIDER_SITE_OTHER): Payer: PRIVATE HEALTH INSURANCE | Admitting: Family Medicine

## 2019-10-27 DIAGNOSIS — R079 Chest pain, unspecified: Secondary | ICD-10-CM | POA: Diagnosis not present

## 2019-10-27 DIAGNOSIS — R1032 Left lower quadrant pain: Secondary | ICD-10-CM | POA: Diagnosis not present

## 2019-10-27 DIAGNOSIS — R600 Localized edema: Secondary | ICD-10-CM | POA: Diagnosis not present

## 2019-10-27 DIAGNOSIS — I82422 Acute embolism and thrombosis of left iliac vein: Secondary | ICD-10-CM | POA: Diagnosis not present

## 2019-10-27 DIAGNOSIS — R531 Weakness: Secondary | ICD-10-CM

## 2019-10-27 DIAGNOSIS — R5383 Other fatigue: Secondary | ICD-10-CM | POA: Diagnosis not present

## 2019-10-27 DIAGNOSIS — R0600 Dyspnea, unspecified: Secondary | ICD-10-CM | POA: Diagnosis not present

## 2019-10-27 DIAGNOSIS — Z882 Allergy status to sulfonamides status: Secondary | ICD-10-CM | POA: Diagnosis not present

## 2019-10-27 DIAGNOSIS — I82492 Acute embolism and thrombosis of other specified deep vein of left lower extremity: Secondary | ICD-10-CM | POA: Diagnosis not present

## 2019-10-27 DIAGNOSIS — R778 Other specified abnormalities of plasma proteins: Secondary | ICD-10-CM | POA: Diagnosis not present

## 2019-10-27 DIAGNOSIS — R9431 Abnormal electrocardiogram [ECG] [EKG]: Secondary | ICD-10-CM | POA: Diagnosis not present

## 2019-10-27 DIAGNOSIS — I82402 Acute embolism and thrombosis of unspecified deep veins of left lower extremity: Secondary | ICD-10-CM | POA: Diagnosis not present

## 2019-10-27 DIAGNOSIS — Z880 Allergy status to penicillin: Secondary | ICD-10-CM | POA: Diagnosis not present

## 2019-10-27 DIAGNOSIS — I440 Atrioventricular block, first degree: Secondary | ICD-10-CM | POA: Diagnosis not present

## 2019-10-27 NOTE — Progress Notes (Signed)
Virtual Visit via Video Note  I connected with Madison White   on 10/27/19 at 11:00 AM EDT by a video enabled telemedicine application and verified that I am speaking with the correct person using two identifiers.  Location patient: home, [place] Location provider:work or home office Persons participating in the virtual visit: patient, provider, daughter and grandaughter  I discussed the limitations of evaluation and management by telemedicine and the availability of in person appointments. The patient expressed understanding and agreed to proceed.   HPI:  Acute visit for Dyspnea, weakness and acute onset of L leg swelling: -has some dyspnea at baseline but worsening the last several days with worsening weakness -also reports groin and upper leg pain for several day, now with significant L leg edema developing over the last 24 hours -saw PCP 2 days ago for UTI, and abscess in R groin - reports this is improving -no discoloration of the leg -denies fevers, CP, procedures or known injury to L leg -hurting more when walks on this leg -has A. Fib, failed cardioversion per daughter, pm -also anemic recently on labs per family members  ROS: See pertinent positives and negatives per HPI.  Past Medical History:  Diagnosis Date  . Acute lower GI bleeding   . Diastolic dysfunction, left ventricle   . GERD (gastroesophageal reflux disease)   . 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  [name] History of blood transfusion   . History of rheumatic fever   . HTN (hypertension)    hx of refusing treatment--White coat HTN and/or situational HTN (?)   . Hyperlipidemia    hx of refusing treatment  . Insulin resistance    A1c's excellent per old records (6.1 in 2008 and 2009)  . Left  bundle branch block 12/2011   Dr. Sallyanne Kuster at [facility]; ECHO and myocardial perfusion scan showed  septal and apical wall motion abnormality but she had no significant valvular disease and no ischemia.  She did have mildly decreased EF (39% on lexiscan and 50% on echo) and abnl LV relaxation.  Mild amount of PVCs.  Pt at higher risk for other conduction abnormalities, good chance of eventually requiring a pacemaker.   [name] LVH (left ventricular hypertrophy)   . Nonischemic cardiomyopathy ([place]) 2016   LV dysfunction due to LBBB/septal dyssynchrony  . Osteoarthritis    bilat knees--needs bilat TKA.  Ortho is trying steroid injections as of 10/23/15.  . Presence of biventricular cardiac pacemaker 04/05/2019  . Presence of permanent cardiac pacemaker 05/05/2017  . Seasonal allergies   . Solitary pulmonary nodule 05/2016   8 mm pleural based nodule in RLL.  Radiology recommended f/u noncontrast CT in 6-12 mo.    Past Surgical History:  Procedure Laterality Date  . ABDOMINAL HYSTERECTOMY  1963   At the time of her last C/S; says she had partial bladder resection at that time as well  . BI-VENTRICULAR PACEMAKER INSERTION (CRT-P)  05/05/2017  . BIV PACEMAKER INSERTION CRT-P N/A 05/05/2017   Procedure: BIV PACEMAKER INSERTION CRT-P;  Surgeon: Evans Lance, MD;  [facility];  Service: Cardiovascular;  Laterality: N/A;  . CARDIOVASCULAR STRESS TEST  08/2005 & 12/2011   Low risk scans (on the 12/2011 scan she did have EF 39% with moderately severe  LV dysfunction with septal dyssynergy probably contributed by LBBB  . CARDIOVERSION N/A 08/09/2019   Procedure: CARDIOVERSION;  Surgeon: Pixie Casino, MD;  Location: [facility] ENDOSCOPY;  Service: Cardiovascular;  Laterality: N/A;  . Carotid dopplers  11/22/14; 11/30/16   NORMAL 2016 and 2018  . CATARACT EXTRACTION  08/14/11   both  . CESAREAN SECTION  X 5   One of her neonates died soon after birth  . CHOLECYSTECTOMY     1980's  . COLONOSCOPY WITH  PROPOFOL N/A 09/02/2017   Procedure: COLONOSCOPY WITH PROPOFOL;  Surgeon: Ladene Artist, MD;  Location: [facility] ENDOSCOPY;  Service: Endoscopy;  Laterality: N/A;  . HOT HEMOSTASIS N/A 09/02/2017   Procedure: HOT HEMOSTASIS (ARGON PLASMA COAGULATION/BICAP);  Surgeon: Ladene Artist, MD;  Location: [facility] ENDOSCOPY;  Service: Endoscopy;  Laterality: N/A;  . INSERT / REPLACE / REMOVE PACEMAKER    . JOINT REPLACEMENT Bilateral    knee  . right knee surgery Right 05/2016   ? R TKA: no records.  [name] STRABISMUS SURGERY  1939  . TONSILLECTOMY  1938  . TRANSTHORACIC ECHOCARDIOGRAM  01/13/12   Septal dyssynergy, EF 50%, LV relaxation impaired.  No significant valvular abnormalities.    Family History  Problem Relation Age of Onset  . Hypertension Mother   . Heart disease Mother   . Hyperlipidemia Mother   . Diabetes Mother   . Heart disease Father   . Cancer Father     SOCIAL HX: see hpi   Current Outpatient Medications:  .  amLODipine (NORVASC) 5 MG tablet, Take 1 tablet (5 mg total) by mouth daily., Disp: 30 tablet, Rfl: 11 .  cephALEXin (KEFLEX) 500 MG capsule, Take 1 capsule (500 mg total) by mouth 2 (two) times daily., Disp: 14 capsule, Rfl: 0 .  cetirizine (ZYRTEC) 10 MG tablet, Take 5 mg by mouth in the morning and at bedtime., Disp: , Rfl:  .  ELIQUIS 5 MG TABS tablet, TAKE 1 TABLET BY MOUTH TWICE A DAY (Patient taking differently: Take 5 mg by mouth 2 (two) times daily. ), Disp: 180 tablet, Rfl: 0 .  fexofenadine (ALLEGRA) 180 MG tablet, Take 180 mg by mouth daily., Disp: , Rfl:  .  metoprolol tartrate (LOPRESSOR) 25 MG tablet, TAKE 1 TABLET BY MOUTH TWICE A DAY, Disp: 180 tablet, Rfl: 0 .  Multiple Vitamin (MULTIVITAMIN) tablet, Take 1 tablet by mouth daily. (Patient not taking: Reported on 10/25/2019), Disp: , Rfl:  .  omeprazole (PRILOSEC) 20 MG capsule, TAKE 1 CAPSULE BY MOUTH EVERY DAY, Disp: 90 capsule, Rfl: 1  EXAM:  VITALS per patient if applicable:see hpi  GENERAL: alert,  oriented, appears well and in no acute distress  HEENT: atraumatic, conjunttiva clear, no obvious abnormalities on inspection of external nose and ears  NECK: normal movements of the head and neck  LUNGS: on inspection no signs of respiratory distress, breathing rate appears normal, no obvious gross SOB, gasping or wheezing  CV: no obvious cyanosis, per granddaughter who works in healthcare and video exam 2+ pitting edema of the L leg from foot to upper thigh, good pedal pulses; minimal R ankle edema  MS: moves all visible extremities without noticeable abnormality  PSYCH/NEURO: pleasant and cooperative, no obvious depression or anxiety, speech and thought processing grossly intact  ASSESSMENT AND PLAN:  Discussed the following assessment and plan:  Dyspnea, unspecified type  Weakness  Leg edema, left  Left groin pain  -we discussed possible serious and likely etiologies, options for evaluation and  workup, limitations of telemedicine visit vs in person visit, treatment, treatment risks and precautions. Had lengthy discussion with daughter and patient and given severity and concerning symptoms along with the potential differential advised referral for inperson emergency care today. They agree as she was supposed to travel by airplane and they want to rule out any potential serious causes asap. Over 20 minutes spent on this visit, most in counseling. Discussed options and grandaughter is considering options and agrees to take her for evaluation today.    I discussed the assessment and treatment plan with the patient. The patient was provided an opportunity to ask questions and all were answered. The patient agreed with the plan and demonstrated an understanding of the instructions.   The patient was advised to call back or seek an in-person evaluation if the symptoms worsen or if the condition fails to improve as anticipated.   Lucretia Kern, DO

## 2019-10-28 ENCOUNTER — Telehealth: Payer: Self-pay | Admitting: Cardiovascular Disease

## 2019-10-28 ENCOUNTER — Encounter: Payer: Self-pay | Admitting: Family Medicine

## 2019-10-28 DIAGNOSIS — I82402 Acute embolism and thrombosis of unspecified deep veins of left lower extremity: Secondary | ICD-10-CM | POA: Diagnosis not present

## 2019-10-28 NOTE — Telephone Encounter (Signed)
Patient's granddaughter calling stating the patient has DVT in her left leg. Patient is scheduled 11/16/2019 with Roby Lofts. She states she also left a message with the patient's vascular doctor.

## 2019-10-28 NOTE — Telephone Encounter (Signed)
     Transferred call to [place]

## 2019-10-28 NOTE — Telephone Encounter (Signed)
LMTCB - per chart review, saw Colin Benton DO yesterday via virtual visit - could not locate diagnosis in Epic for DVT. Patient is on Eliquis

## 2019-10-28 NOTE — Telephone Encounter (Signed)
Granddaughter, Cyril Mourning returned call  Patient went to [facility] ED yesterday after telehealth visit with Colin Benton DO and was diagnosed with left leg DVT.   Patient has an appointment with the DVT clinic with [facility] today   She was given a lovenox injection prescription, which granddaughter is assisting with   Patient is already on Eliquis anticoagulation  They were advised to f/u with cardiology - OV 9/1 with Daleen Snook PA schedule  Will send to Dr. Sallyanne Kuster for any further advice

## 2019-10-29 ENCOUNTER — Other Ambulatory Visit: Payer: Self-pay | Admitting: Cardiovascular Disease

## 2019-10-31 ENCOUNTER — Encounter: Payer: Self-pay | Admitting: Family Medicine

## 2019-10-31 DIAGNOSIS — Z79899 Other long term (current) drug therapy: Secondary | ICD-10-CM | POA: Diagnosis not present

## 2019-10-31 DIAGNOSIS — R0602 Shortness of breath: Secondary | ICD-10-CM | POA: Diagnosis not present

## 2019-10-31 DIAGNOSIS — Z950 Presence of cardiac pacemaker: Secondary | ICD-10-CM | POA: Diagnosis not present

## 2019-10-31 DIAGNOSIS — Z20822 Contact with and (suspected) exposure to covid-19: Secondary | ICD-10-CM | POA: Diagnosis not present

## 2019-10-31 DIAGNOSIS — Z882 Allergy status to sulfonamides status: Secondary | ICD-10-CM | POA: Diagnosis not present

## 2019-10-31 DIAGNOSIS — R9431 Abnormal electrocardiogram [ECG] [EKG]: Secondary | ICD-10-CM | POA: Diagnosis not present

## 2019-10-31 DIAGNOSIS — R0682 Tachypnea, not elsewhere classified: Secondary | ICD-10-CM | POA: Diagnosis not present

## 2019-10-31 DIAGNOSIS — I824Y2 Acute embolism and thrombosis of unspecified deep veins of left proximal lower extremity: Secondary | ICD-10-CM | POA: Diagnosis not present

## 2019-10-31 DIAGNOSIS — Z880 Allergy status to penicillin: Secondary | ICD-10-CM | POA: Diagnosis not present

## 2019-10-31 DIAGNOSIS — R7989 Other specified abnormal findings of blood chemistry: Secondary | ICD-10-CM | POA: Diagnosis not present

## 2019-10-31 DIAGNOSIS — I4891 Unspecified atrial fibrillation: Secondary | ICD-10-CM | POA: Diagnosis not present

## 2019-10-31 DIAGNOSIS — R918 Other nonspecific abnormal finding of lung field: Secondary | ICD-10-CM | POA: Diagnosis not present

## 2019-10-31 DIAGNOSIS — J439 Emphysema, unspecified: Secondary | ICD-10-CM | POA: Diagnosis not present

## 2019-10-31 LAB — HEPATIC FUNCTION PANEL
ALT: 9 (ref 7–35)
AST: 18 (ref 13–35)
Alkaline Phosphatase: 64 (ref 25–125)
Bilirubin, Total: 0.2

## 2019-10-31 LAB — COMPREHENSIVE METABOLIC PANEL
Albumin: 3.6 (ref 3.5–5.0)
Calcium: 9.3 (ref 8.7–10.7)
GFR calc Af Amer: 71
GFR calc non Af Amer: 61
Globulin: 3.3

## 2019-10-31 LAB — NOVEL CORONAVIRUS, NAA: SARS-CoV-2, NAA: NOT DETECTED

## 2019-10-31 LAB — BASIC METABOLIC PANEL
BUN: 12 (ref 4–21)
CO2: 28 — AB (ref 13–22)
Chloride: 104 (ref 99–108)
Creatinine: 0.9 (ref 0.5–1.1)
Glucose: 181
Potassium: 3.9 (ref 3.4–5.3)
Sodium: 139 (ref 137–147)

## 2019-10-31 LAB — CBC AND DIFFERENTIAL
HCT: 33 — AB (ref 36–46)
Hemoglobin: 10 — AB (ref 12.0–16.0)
Platelets: 380 (ref 150–399)
WBC: 8.4

## 2019-10-31 NOTE — Telephone Encounter (Signed)
Can call pt's granddaughter: reviewed notes. Needs f/u with heme and cards as recommended.  I'm very concerned about her extensive clog and decreasing blood counts while she was anticoagulated. Need to find out why this happened. Can order home health to assist with injections and for observations of vitals etc.  Can ask pt what she is wanting. She may qualify for an aid for a few hours a day but may not. She would not qualify for someone to be with her full time etc ... Can place referral once this is clarified. thanks

## 2019-11-01 ENCOUNTER — Telehealth: Payer: Self-pay

## 2019-11-01 DIAGNOSIS — R531 Weakness: Secondary | ICD-10-CM

## 2019-11-01 NOTE — Telephone Encounter (Signed)
Spoke with the patient' granddaughter Madison White and she stated that her grandmother is very deconditioned and unable to complete simple ADLs without needing to take 15 min break or nap to gain some of her energy back. As of right now she is staying with different family member throughout the day so that she is not alone. Patient's granddaughter would like to know does she still need to continue her Norvasc? Patient does have a follow up with Cardiology on September 1st. Granddaughter is going to make a follow up with Hematology today. Patient has been doing some exercises in the pool with her granddaughter's assistance. Granddaughter would love for a referral for home health and physical therapy, she verbalized understanding that it may only be for a couple of hours a day. Referral has been placed.

## 2019-11-01 NOTE — Telephone Encounter (Signed)
Can add PT referral for strengthening.  What is BP running? If stable, continue amlodipine. Reply back if low or has questions.

## 2019-11-07 ENCOUNTER — Ambulatory Visit (INDEPENDENT_AMBULATORY_CARE_PROVIDER_SITE_OTHER): Payer: Medicare HMO | Admitting: *Deleted

## 2019-11-07 DIAGNOSIS — I428 Other cardiomyopathies: Secondary | ICD-10-CM | POA: Diagnosis not present

## 2019-11-07 DIAGNOSIS — I442 Atrioventricular block, complete: Secondary | ICD-10-CM

## 2019-11-07 LAB — CUP PACEART REMOTE DEVICE CHECK
Battery Remaining Longevity: 89 mo
Battery Remaining Percentage: 95.5 %
Battery Voltage: 2.98 V
Brady Statistic AP VP Percent: 7.2 %
Brady Statistic AP VS Percent: 1 %
Brady Statistic AS VP Percent: 91 %
Brady Statistic AS VS Percent: 1 %
Brady Statistic RA Percent Paced: 5.1 %
Date Time Interrogation Session: 20210821221000
Implantable Lead Implant Date: 20190219
Implantable Lead Implant Date: 20190219
Implantable Lead Implant Date: 20190219
Implantable Lead Location: 753858
Implantable Lead Location: 753859
Implantable Lead Location: 753860
Implantable Pulse Generator Implant Date: 20190219
Lead Channel Impedance Value: 460 Ohm
Lead Channel Impedance Value: 690 Ohm
Lead Channel Impedance Value: 810 Ohm
Lead Channel Pacing Threshold Amplitude: 0.5 V
Lead Channel Pacing Threshold Amplitude: 1 V
Lead Channel Pacing Threshold Amplitude: 1.75 V
Lead Channel Pacing Threshold Pulse Width: 0.5 ms
Lead Channel Pacing Threshold Pulse Width: 0.5 ms
Lead Channel Pacing Threshold Pulse Width: 0.8 ms
Lead Channel Sensing Intrinsic Amplitude: 0.9 mV
Lead Channel Sensing Intrinsic Amplitude: 12 mV
Lead Channel Setting Pacing Amplitude: 2 V
Lead Channel Setting Pacing Amplitude: 2.5 V
Lead Channel Setting Pacing Amplitude: 2.5 V
Lead Channel Setting Pacing Pulse Width: 0.5 ms
Lead Channel Setting Pacing Pulse Width: 0.8 ms
Lead Channel Setting Sensing Sensitivity: 4 mV
Pulse Gen Model: 3262
Pulse Gen Serial Number: 8995296

## 2019-11-08 NOTE — Telephone Encounter (Signed)
If blood pressure is not low, (says it is normal) Can continue norvasc and f/u with cards. thanks

## 2019-11-09 ENCOUNTER — Telehealth: Payer: Self-pay

## 2019-11-09 NOTE — Telephone Encounter (Signed)
Appt scheduled and given note that referral was being checked on

## 2019-11-09 NOTE — Telephone Encounter (Signed)
Pt.'s granddaughter called with 2 concerns/questions.  1. Pt has an appointment for a hospital follow up on the 1st , but cant make it. The next available is the 21st. Could I use a same day to get her in earlier?  2. Pt.'s granddaughter had requested home care and pt for her grandmother and hasn't heard anything back.

## 2019-11-09 NOTE — Telephone Encounter (Signed)
There are virtual slots available on the 8th and 10th the following week. Home care referral has been previously placed, Madison White is f/u today.

## 2019-11-09 NOTE — Telephone Encounter (Signed)
[facility] alert received 11/09/19 for persistent AF w/ controlled VR's. OAC-Eliquis  Could we turn the AF alerts off or would you like to leave them on?

## 2019-11-09 NOTE — Telephone Encounter (Signed)
Put in note for next apt. Ok to turn off AF alerts per Dr. Sallyanne Kuster.

## 2019-11-09 NOTE — Telephone Encounter (Signed)
OK to turn alerts off. Thanks.

## 2019-11-11 DIAGNOSIS — K219 Gastro-esophageal reflux disease without esophagitis: Secondary | ICD-10-CM | POA: Diagnosis not present

## 2019-11-11 DIAGNOSIS — K5909 Other constipation: Secondary | ICD-10-CM | POA: Diagnosis not present

## 2019-11-11 DIAGNOSIS — I428 Other cardiomyopathies: Secondary | ICD-10-CM | POA: Diagnosis not present

## 2019-11-11 DIAGNOSIS — I442 Atrioventricular block, complete: Secondary | ICD-10-CM | POA: Diagnosis not present

## 2019-11-11 DIAGNOSIS — I447 Left bundle-branch block, unspecified: Secondary | ICD-10-CM | POA: Diagnosis not present

## 2019-11-11 DIAGNOSIS — I951 Orthostatic hypotension: Secondary | ICD-10-CM | POA: Diagnosis not present

## 2019-11-11 DIAGNOSIS — I10 Essential (primary) hypertension: Secondary | ICD-10-CM | POA: Diagnosis not present

## 2019-11-11 DIAGNOSIS — N132 Hydronephrosis with renal and ureteral calculous obstruction: Secondary | ICD-10-CM | POA: Diagnosis not present

## 2019-11-11 DIAGNOSIS — I4819 Other persistent atrial fibrillation: Secondary | ICD-10-CM | POA: Diagnosis not present

## 2019-11-11 DIAGNOSIS — E785 Hyperlipidemia, unspecified: Secondary | ICD-10-CM | POA: Diagnosis not present

## 2019-11-11 NOTE — Progress Notes (Signed)
Remote pacemaker transmission.   

## 2019-11-14 ENCOUNTER — Telehealth: Payer: Self-pay | Admitting: Family Medicine

## 2019-11-14 DIAGNOSIS — I447 Left bundle-branch block, unspecified: Secondary | ICD-10-CM | POA: Diagnosis not present

## 2019-11-14 DIAGNOSIS — I4819 Other persistent atrial fibrillation: Secondary | ICD-10-CM | POA: Diagnosis not present

## 2019-11-14 DIAGNOSIS — I428 Other cardiomyopathies: Secondary | ICD-10-CM | POA: Diagnosis not present

## 2019-11-14 DIAGNOSIS — K5909 Other constipation: Secondary | ICD-10-CM | POA: Diagnosis not present

## 2019-11-14 DIAGNOSIS — I442 Atrioventricular block, complete: Secondary | ICD-10-CM | POA: Diagnosis not present

## 2019-11-14 DIAGNOSIS — N132 Hydronephrosis with renal and ureteral calculous obstruction: Secondary | ICD-10-CM | POA: Diagnosis not present

## 2019-11-14 DIAGNOSIS — E785 Hyperlipidemia, unspecified: Secondary | ICD-10-CM | POA: Diagnosis not present

## 2019-11-14 DIAGNOSIS — I10 Essential (primary) hypertension: Secondary | ICD-10-CM | POA: Diagnosis not present

## 2019-11-14 DIAGNOSIS — I951 Orthostatic hypotension: Secondary | ICD-10-CM | POA: Diagnosis not present

## 2019-11-14 DIAGNOSIS — K219 Gastro-esophageal reflux disease without esophagitis: Secondary | ICD-10-CM | POA: Diagnosis not present

## 2019-11-14 NOTE — Telephone Encounter (Signed)
Madison White is calling from [facility] requesting verbal orders for at home PT 1 week 7 then home health aid for 1 week for 3 weeks.

## 2019-11-14 NOTE — Telephone Encounter (Signed)
Spoke with [place] with [facility] to give verbal orders from Dr. Jonni Sanger. No other questions or concerns at this time.

## 2019-11-14 NOTE — Telephone Encounter (Signed)
Yes please

## 2019-11-15 NOTE — Progress Notes (Signed)
Cardiology Office Note   Date:  11/16/2019   ID:  Madison White, DOB April 10, 1928, MRN [id number]  PCP:  Leamon Arnt, MD  Cardiologist:  Sanda Klein, MD EP: Cristopher Peru, MD  Chief Complaint  Patient presents with  . Follow-up    recent DVT      History of Present Illness: Madison White is a 86 y.o. female with PMH of non-ischemic cardiomyopathy felt to be 2/2 LBBB, persistent atrial fibrillation, HTN, HLD, CHB s/p PPM who presents for follow-up of recent DVT.  She was last evaluated by cardiology at an outpatient visit with Madison Palau, NP 08/2019 in follow-up of her recent successful cardioversion 07/2019 for management of recurrent atrial fibrillation. She was doing well at that visit without cardiac complaints. No medication changes occurred and she was recommended to follow-up with general cardiology within a month, however has not bee seen since that time. In the interim, patient developed LLE swelling and pain, seen at the ED in [facility] 10/27/19 and diagnosed with an acute LLE DVT. She was discharged home on therapeutic lovenox injections BID. She was seen by vascular surgery 10/28/19 and recommended to continue lovenox injections x3 months and follow-up with cardiology for ongoing anticoagulation recommendations. She was referred to hematology as well given DVT occurred while on apixaban. She had a subsequent ED visit 10/31/19 for worsening SOB and fatigue. CTA chest did not show any PE but noted small groundglass nodular infiltrates of the posterior lateral RUL c/f infectious or inflammatory process recommended for repeat study in 3 months.   Her last ischemic evaluation was a NST in 2013 which showed no reversible ischemia, though noted EF to be 39%, felt to be 2/2 her LBBB. She had a CTA chest 10/31/19 which showed atherosclerotic disease.  Since being diagnosed with a DVT she has had worsening SOB. CXR 10/31/19 did not show any acute findings and there was no  suggestion of PE, pleural effusions, or pulmonary edema on CTA Chest that day. Labs were notable for Hgb 10.0, down from 12.9 07/2019, though she has no complaints of bleeding. BNP was 216 10/25/19.   Daughter and patient report she had been quite deconditioned since COVID onset. Her DOE has improved significantly over the past couple weeks with addition of home PT/OT and water exercises. She reports improvement in her LLE swelling and is tolerating the lovenox injections without bleeding. Daughter reports that prior to [name]'s DVT diagnosis, the patient had been staying with her for the week and, unbeknownst to her, did not have her apixaban in her pillbox for at least 3-4 days. She has no complaints of chest pain, SOB at rest, dizziness, lightheadedness, syncope, orthopnea, or PND.     Past Medical History:  Diagnosis Date  . Acute lower GI bleeding   . Diastolic dysfunction, left ventricle   . GERD (gastroesophageal reflux disease)   . 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  [name] History of blood transfusion   . History of rheumatic fever   . HTN (hypertension)    hx of refusing treatment--White coat HTN and/or situational HTN (?)   . Hyperlipidemia    hx of refusing treatment  . Insulin resistance    A1c's excellent per old records (6.1 in 2008  and 2009)  . Left bundle branch block 12/2011   Dr. Sallyanne Kuster at [facility]; ECHO and myocardial perfusion scan showed  septal and apical wall motion abnormality but she had no significant valvular disease and no ischemia.  She did have mildly decreased EF (39% on lexiscan and 50% on echo) and abnl LV relaxation.  Mild amount of PVCs.  Pt at higher risk for other conduction abnormalities, good chance of eventually requiring a  pacemaker.   [name] LVH (left ventricular hypertrophy)   . Nonischemic cardiomyopathy ([place]) 2016   LV dysfunction due to LBBB/septal dyssynchrony  . Osteoarthritis    bilat knees--needs bilat TKA.  Ortho is trying steroid injections as of 10/23/15.  . Presence of biventricular cardiac pacemaker 04/05/2019  . Presence of permanent cardiac pacemaker 05/05/2017  . Seasonal allergies   . Solitary pulmonary nodule 05/2016   8 mm pleural based nodule in RLL.  Radiology recommended f/u noncontrast CT in 6-12 mo.    Past Surgical History:  Procedure Laterality Date  . ABDOMINAL HYSTERECTOMY  1963   At the time of her last C/S; says she had partial bladder resection at that time as well  . BI-VENTRICULAR PACEMAKER INSERTION (CRT-P)  05/05/2017  . BIV PACEMAKER INSERTION CRT-P N/A 05/05/2017   Procedure: BIV PACEMAKER INSERTION CRT-P;  Surgeon: Evans Lance, MD;  Location: [facility] LAB;  Service: Cardiovascular;  Laterality: N/A;  . CARDIOVASCULAR STRESS TEST  08/2005 & 12/2011   Low risk scans (on the 12/2011 scan she did have EF 39% with moderately severe LV dysfunction with septal dyssynergy probably contributed by LBBB  . CARDIOVERSION N/A 08/09/2019   Procedure: CARDIOVERSION;  Surgeon: Pixie Casino, MD;  Location: [facility] ENDOSCOPY;  Service: Cardiovascular;  Laterality: N/A;  . Carotid dopplers  11/22/14; 11/30/16   NORMAL 2016 and 2018  . CATARACT EXTRACTION  08/14/11   both  . CESAREAN SECTION  X 5   One of her neonates died soon after birth  . CHOLECYSTECTOMY     1980's  . COLONOSCOPY WITH PROPOFOL N/A 09/02/2017   Procedure: COLONOSCOPY WITH PROPOFOL;  Surgeon: Ladene Artist, MD;  Location: [facility] ENDOSCOPY;  Service: Endoscopy;  Laterality: N/A;  . HOT HEMOSTASIS N/A 09/02/2017   Procedure: HOT HEMOSTASIS (ARGON PLASMA COAGULATION/BICAP);  Surgeon: Ladene Artist, MD;  Location: [facility] ENDOSCOPY;  Service: Endoscopy;  Laterality: N/A;  . INSERT / REPLACE / REMOVE PACEMAKER    . JOINT  REPLACEMENT Bilateral    knee  . right knee surgery Right 05/2016   ? R TKA: no records.  [name] STRABISMUS SURGERY  1939  . TONSILLECTOMY  1938  . TRANSTHORACIC ECHOCARDIOGRAM  01/13/12   Septal dyssynergy, EF 50%, LV relaxation impaired.  No significant valvular abnormalities.     Current Outpatient Medications  Medication Sig Dispense Refill  . amLODipine (NORVASC) 5 MG tablet Take 1 tablet (5 mg total) by mouth daily. 30 tablet 11  . calcium-vitamin D (OSCAL WITH D) 500-200 MG-UNIT tablet Take 1 tablet by mouth.    . cyanocobalamin 100 MCG tablet Take by mouth.    . enoxaparin (LOVENOX) 60 MG/0.6ML injection SMARTSIG:0.6 Milliliter(s) SUB-Q Every 12 Hours    . Iron-Vitamin C 100-250 MG TABS Take by mouth.    . metoprolol tartrate (LOPRESSOR) 25 MG tablet TAKE 1 TABLET BY MOUTH TWICE A DAY 180 tablet 0  . Multiple Vitamin (MULTIVITAMIN) tablet Take 1 tablet by mouth daily.     [name] omeprazole (PRILOSEC) 20 MG capsule  TAKE 1 CAPSULE BY MOUTH EVERY DAY 90 capsule 1  . PEDIATRIC MULTIVITAMINS-IRON PO Take by mouth.    [name] apixaban (ELIQUIS) 5 MG TABS tablet Take 1 tablet (5 mg total) by mouth 2 (two) times daily. 180 tablet 3   No current facility-administered medications for this visit.    Allergies:   Penicillins, Nickel, Avapro [irbesartan], Lisinopril, Simvastatin, Latex, Sulfa antibiotics, and Sulfamethoxazole    Social History:  The patient  reports that she quit smoking about 61 years ago. She has never used smokeless tobacco. She reports current alcohol use of about 1.0 standard drink of alcohol per week. She reports that she does not use drugs.   Family History:  The patient's family history includes Cancer in her father; Diabetes in her mother; Heart disease in her father and mother; Hyperlipidemia in her mother; Hypertension in her mother.    ROS:  Please see the history of present illness.   Otherwise, review of systems are positive for none.   All other systems are reviewed and  negative.    PHYSICAL EXAM: VS:  BP 134/78   Pulse 66   Ht 5\' 1"  (1.549 m)   Wt 154 lb (69.9 kg)   BMI 29.10 kg/m  , BMI Body mass index is 29.1 kg/m. GEN: Well nourished, well developed, in no acute distress HEENT: sclera anicteric Neck: no JVD, carotid bruits, or masses Cardiac: RRR; no murmurs, rubs, or gallops, no RLE edema, non-pitting LLE edema  Respiratory:  clear to auscultation bilaterally, normal work of breathing GI: soft, nontender, nondistended, + BS MS: no deformity or atrophy Skin: warm and dry, no rash Neuro:  Strength and sensation are intact Psych: euthymic mood, full affect   EKG:  EKG is not ordered today.   Recent Labs: 10/25/2019: Brain Natriuretic Peptide 216; TSH 3.48 10/31/2019: ALT 9; BUN 12; Creatinine 0.9; Hemoglobin 10.0; Platelets 380; Potassium 3.9; Sodium 139    Lipid Panel    Component Value Date/Time   CHOL 181 10/25/2019 0938   TRIG 112 10/25/2019 0938   HDL 57 10/25/2019 0938   CHOLHDL 3.2 10/25/2019 0938   VLDL 41.4 (H) 04/05/2019 1516   LDLCALC 103 (H) 10/25/2019 0938   LDLDIRECT 125.0 04/05/2019 1516      Wt Readings from Last 3 Encounters:  11/16/19 154 lb (69.9 kg)  10/25/19 155 lb 3.2 oz (70.4 kg)  10/24/19 153 lb (69.4 kg)      Other studies Reviewed: Additional studies/ records that were reviewed today include:  None    ASSESSMENT AND PLAN:  1. Acute LLE DVT: this occurred in the setting of being off eliquis 5mg  BID for at least 3-4 days. Seen by vascular and no indication for surgical intervention. She was transitioned to therapeutic lovenox BID, though given this is not failed eliquis would be reasonable to transition back to [place] now. Xarelto was not covered by insurance so will resume apixaban 5mg  BID - Continue apixaban 5mg  BID - assistance paperwork completed at today's visit to help with cost. Can always transition to coumadin if cost is prohibitive.   2. DOE in patient with history of non-ischemic  cardiomyopathy: attributed to deconditioning. This has improved dramatically with increased activity - home PT/OT and water exercises.  - Continue to monitor for now - Low threshold to repeat echocardiogram if symptoms worsen.  3. Paroxysmal atrial fibrillation: per device interrogation, patient has been in persistent atrial fibrillation with CVR since 10/15/19, with PAF burden increased since 10/12/19. She is s/p  DCCV 07/2019, though per chart review is not interested in pursing another cardioversion. Seems to be tolerating intermittent episodes without issue - Continue metoprolol for rate control - Will transition back to apixaban 5mg  BID as above  4. HTN: BP 134/78 today - Continue amlodipine and metoprolol  5. HLD: LDL 103 10/25/19; near goal of <100. Not on statin given age and lack of CAD diagnosis - Continue dietary modifications to reduce cholesterol   6. CHB s/p PPM: normal device function on recent interrogation 11/05/19. - Continue routine monitoring per Dr. Lovena Le    Current medicines are reviewed at length with the patient today.  The patient does not have concerns regarding medicines.  The following changes have been made:  As above  Labs/ tests ordered today include:  No orders of the defined types were placed in this encounter.    Disposition:   FU with Dr. Lovena Le in 6 months  Signed, Abigail Butts, PA-C  11/16/2019 11:53 AM

## 2019-11-16 ENCOUNTER — Encounter: Payer: Self-pay | Admitting: Medical

## 2019-11-16 ENCOUNTER — Ambulatory Visit: Payer: Medicare HMO | Admitting: Family Medicine

## 2019-11-16 ENCOUNTER — Telehealth: Payer: Self-pay

## 2019-11-16 ENCOUNTER — Ambulatory Visit (INDEPENDENT_AMBULATORY_CARE_PROVIDER_SITE_OTHER): Payer: Medicare HMO | Admitting: Medical

## 2019-11-16 ENCOUNTER — Other Ambulatory Visit: Payer: Self-pay

## 2019-11-16 VITALS — BP 134/78 | HR 66 | Ht 61.0 in | Wt 154.0 lb

## 2019-11-16 DIAGNOSIS — I10 Essential (primary) hypertension: Secondary | ICD-10-CM | POA: Diagnosis not present

## 2019-11-16 DIAGNOSIS — I4819 Other persistent atrial fibrillation: Secondary | ICD-10-CM | POA: Diagnosis not present

## 2019-11-16 DIAGNOSIS — I442 Atrioventricular block, complete: Secondary | ICD-10-CM

## 2019-11-16 DIAGNOSIS — I428 Other cardiomyopathies: Secondary | ICD-10-CM | POA: Diagnosis not present

## 2019-11-16 DIAGNOSIS — E785 Hyperlipidemia, unspecified: Secondary | ICD-10-CM

## 2019-11-16 DIAGNOSIS — I82402 Acute embolism and thrombosis of unspecified deep veins of left lower extremity: Secondary | ICD-10-CM | POA: Diagnosis not present

## 2019-11-16 MED ORDER — APIXABAN 5 MG PO TABS: 5.0000 mg | ORAL_TABLET | Freq: Two times a day (BID) | ORAL | 3 refills | Status: DC

## 2019-11-16 NOTE — Telephone Encounter (Signed)
Yes please

## 2019-11-16 NOTE — Telephone Encounter (Signed)
Faxed completed form and patient demographics to the [employer] patient assistance [employer].

## 2019-11-16 NOTE — Telephone Encounter (Signed)
LMOVM advising Clair Gulling OK for verbal orders

## 2019-11-16 NOTE — Patient Instructions (Addendum)
Medication Instructions:   RESTART ELIQUIS 5 mg 2 times a day, after completing Lovenox *If you need a refill on your cardiac medications before your next appointment, please call your pharmacy*  Lab Work: NONE ordered at this time of appointment   If you have labs (blood work) drawn today and your tests are completely normal, you will receive your results only by: [name] MyChart Message (if you have MyChart) OR . A paper copy in the mail If you have any lab test that is abnormal or we need to change your treatment, we will call you to review the results.  Testing/Procedures: NONE ordered at this time of appointment   Follow-Up: At [facility], you and your health needs are our priority.  As part of our continuing mission to provide you with exceptional heart care, we have created designated Provider Care Teams.  These Care Teams include your primary Cardiologist (physician) and Advanced Practice Providers (APPs -  Physician Assistants and Nurse Practitioners) who all work together to provide you with the care you need, when you need it.  Your next appointment:   6 month(s)  The format for your next appointment:   In Person  Provider:   Cristopher Peru, MD  Other Instructions

## 2019-11-16 NOTE — Telephone Encounter (Signed)
Verbal Orders for OT  1 week 5 Speech order eval for cognition   [phone]- ok to leave voicemail

## 2019-11-17 ENCOUNTER — Telehealth: Payer: Self-pay | Admitting: Family Medicine

## 2019-11-17 NOTE — Telephone Encounter (Signed)
Madison White from [facility] is calling in wanting to let Dr.Andy that the family canceled an appointment with him at the last minute and is unable to get her back in this week.

## 2019-11-18 DIAGNOSIS — Z7901 Long term (current) use of anticoagulants: Secondary | ICD-10-CM

## 2019-11-18 DIAGNOSIS — I951 Orthostatic hypotension: Secondary | ICD-10-CM | POA: Diagnosis not present

## 2019-11-18 DIAGNOSIS — I428 Other cardiomyopathies: Secondary | ICD-10-CM | POA: Diagnosis not present

## 2019-11-18 DIAGNOSIS — K219 Gastro-esophageal reflux disease without esophagitis: Secondary | ICD-10-CM

## 2019-11-18 DIAGNOSIS — I4819 Other persistent atrial fibrillation: Secondary | ICD-10-CM | POA: Diagnosis not present

## 2019-11-18 DIAGNOSIS — I10 Essential (primary) hypertension: Secondary | ICD-10-CM | POA: Diagnosis not present

## 2019-11-18 DIAGNOSIS — I442 Atrioventricular block, complete: Secondary | ICD-10-CM | POA: Diagnosis not present

## 2019-11-18 DIAGNOSIS — Z950 Presence of cardiac pacemaker: Secondary | ICD-10-CM

## 2019-11-18 DIAGNOSIS — N132 Hydronephrosis with renal and ureteral calculous obstruction: Secondary | ICD-10-CM | POA: Diagnosis not present

## 2019-11-18 DIAGNOSIS — Z87440 Personal history of urinary (tract) infections: Secondary | ICD-10-CM

## 2019-11-18 DIAGNOSIS — K5909 Other constipation: Secondary | ICD-10-CM | POA: Diagnosis not present

## 2019-11-18 DIAGNOSIS — E785 Hyperlipidemia, unspecified: Secondary | ICD-10-CM | POA: Diagnosis not present

## 2019-11-18 DIAGNOSIS — Z87891 Personal history of nicotine dependence: Secondary | ICD-10-CM

## 2019-11-18 DIAGNOSIS — Z9181 History of falling: Secondary | ICD-10-CM

## 2019-11-18 DIAGNOSIS — I447 Left bundle-branch block, unspecified: Secondary | ICD-10-CM | POA: Diagnosis not present

## 2019-11-21 DIAGNOSIS — I447 Left bundle-branch block, unspecified: Secondary | ICD-10-CM | POA: Diagnosis not present

## 2019-11-21 DIAGNOSIS — I951 Orthostatic hypotension: Secondary | ICD-10-CM | POA: Diagnosis not present

## 2019-11-21 DIAGNOSIS — I442 Atrioventricular block, complete: Secondary | ICD-10-CM | POA: Diagnosis not present

## 2019-11-21 DIAGNOSIS — I4819 Other persistent atrial fibrillation: Secondary | ICD-10-CM | POA: Diagnosis not present

## 2019-11-21 DIAGNOSIS — N132 Hydronephrosis with renal and ureteral calculous obstruction: Secondary | ICD-10-CM | POA: Diagnosis not present

## 2019-11-21 DIAGNOSIS — I428 Other cardiomyopathies: Secondary | ICD-10-CM | POA: Diagnosis not present

## 2019-11-21 DIAGNOSIS — K219 Gastro-esophageal reflux disease without esophagitis: Secondary | ICD-10-CM | POA: Diagnosis not present

## 2019-11-21 DIAGNOSIS — I10 Essential (primary) hypertension: Secondary | ICD-10-CM | POA: Diagnosis not present

## 2019-11-21 DIAGNOSIS — E785 Hyperlipidemia, unspecified: Secondary | ICD-10-CM | POA: Diagnosis not present

## 2019-11-21 DIAGNOSIS — K5909 Other constipation: Secondary | ICD-10-CM | POA: Diagnosis not present

## 2019-11-22 ENCOUNTER — Telehealth: Payer: Self-pay | Admitting: Family Medicine

## 2019-11-22 NOTE — Telephone Encounter (Signed)
Anna Genre is calling in from [facility] asking for verbal orders to treat patient, once a week for 4 weeks.

## 2019-11-23 DIAGNOSIS — I4819 Other persistent atrial fibrillation: Secondary | ICD-10-CM | POA: Diagnosis not present

## 2019-11-23 DIAGNOSIS — I442 Atrioventricular block, complete: Secondary | ICD-10-CM | POA: Diagnosis not present

## 2019-11-23 DIAGNOSIS — I951 Orthostatic hypotension: Secondary | ICD-10-CM | POA: Diagnosis not present

## 2019-11-23 DIAGNOSIS — E785 Hyperlipidemia, unspecified: Secondary | ICD-10-CM | POA: Diagnosis not present

## 2019-11-23 DIAGNOSIS — I10 Essential (primary) hypertension: Secondary | ICD-10-CM | POA: Diagnosis not present

## 2019-11-23 DIAGNOSIS — K219 Gastro-esophageal reflux disease without esophagitis: Secondary | ICD-10-CM | POA: Diagnosis not present

## 2019-11-23 DIAGNOSIS — I447 Left bundle-branch block, unspecified: Secondary | ICD-10-CM | POA: Diagnosis not present

## 2019-11-23 DIAGNOSIS — I428 Other cardiomyopathies: Secondary | ICD-10-CM | POA: Diagnosis not present

## 2019-11-23 DIAGNOSIS — N132 Hydronephrosis with renal and ureteral calculous obstruction: Secondary | ICD-10-CM | POA: Diagnosis not present

## 2019-11-23 DIAGNOSIS — K5909 Other constipation: Secondary | ICD-10-CM | POA: Diagnosis not present

## 2019-11-23 NOTE — Telephone Encounter (Signed)
Please clarify: treat? PT/OT or something else.  If PT or OT, yes this is fine.

## 2019-11-23 NOTE — Telephone Encounter (Signed)
Lauren, FYI: when taking messages, please clarify or try to be specific. "ok to treat" - doesn't give me the information that is needed... is he requesting PT/OT, skilled nursing or something else.  Please be specific.  Thanks. I've asked the nurse to call back to clarify. Dr. Jonni Sanger

## 2019-11-25 ENCOUNTER — Other Ambulatory Visit: Payer: Self-pay | Admitting: Family Medicine

## 2019-11-25 ENCOUNTER — Other Ambulatory Visit: Payer: Self-pay

## 2019-11-25 DIAGNOSIS — I442 Atrioventricular block, complete: Secondary | ICD-10-CM | POA: Diagnosis not present

## 2019-11-25 DIAGNOSIS — E785 Hyperlipidemia, unspecified: Secondary | ICD-10-CM | POA: Diagnosis not present

## 2019-11-25 DIAGNOSIS — I447 Left bundle-branch block, unspecified: Secondary | ICD-10-CM | POA: Diagnosis not present

## 2019-11-25 DIAGNOSIS — I428 Other cardiomyopathies: Secondary | ICD-10-CM | POA: Diagnosis not present

## 2019-11-25 DIAGNOSIS — N132 Hydronephrosis with renal and ureteral calculous obstruction: Secondary | ICD-10-CM | POA: Diagnosis not present

## 2019-11-25 DIAGNOSIS — I10 Essential (primary) hypertension: Secondary | ICD-10-CM | POA: Diagnosis not present

## 2019-11-25 DIAGNOSIS — K219 Gastro-esophageal reflux disease without esophagitis: Secondary | ICD-10-CM | POA: Diagnosis not present

## 2019-11-25 DIAGNOSIS — I4819 Other persistent atrial fibrillation: Secondary | ICD-10-CM | POA: Diagnosis not present

## 2019-11-25 DIAGNOSIS — I951 Orthostatic hypotension: Secondary | ICD-10-CM | POA: Diagnosis not present

## 2019-11-25 DIAGNOSIS — K5909 Other constipation: Secondary | ICD-10-CM | POA: Diagnosis not present

## 2019-11-25 MED ORDER — METOPROLOL TARTRATE 25 MG PO TABS
25.0000 mg | ORAL_TABLET | Freq: Two times a day (BID) | ORAL | 3 refills | Status: DC
Start: 2019-11-25 — End: 2020-11-27

## 2019-11-25 NOTE — Telephone Encounter (Signed)
Anna Genre called back to speak to [place].

## 2019-11-25 NOTE — Telephone Encounter (Signed)
LVM asking Madison White to give me call back in regards to verbal orders. Office number was provided.

## 2019-11-28 DIAGNOSIS — K5909 Other constipation: Secondary | ICD-10-CM | POA: Diagnosis not present

## 2019-11-28 DIAGNOSIS — N132 Hydronephrosis with renal and ureteral calculous obstruction: Secondary | ICD-10-CM | POA: Diagnosis not present

## 2019-11-28 DIAGNOSIS — E785 Hyperlipidemia, unspecified: Secondary | ICD-10-CM | POA: Diagnosis not present

## 2019-11-28 DIAGNOSIS — I10 Essential (primary) hypertension: Secondary | ICD-10-CM | POA: Diagnosis not present

## 2019-11-28 DIAGNOSIS — I447 Left bundle-branch block, unspecified: Secondary | ICD-10-CM | POA: Diagnosis not present

## 2019-11-28 DIAGNOSIS — I4819 Other persistent atrial fibrillation: Secondary | ICD-10-CM | POA: Diagnosis not present

## 2019-11-28 DIAGNOSIS — I428 Other cardiomyopathies: Secondary | ICD-10-CM | POA: Diagnosis not present

## 2019-11-28 DIAGNOSIS — I442 Atrioventricular block, complete: Secondary | ICD-10-CM | POA: Diagnosis not present

## 2019-11-28 DIAGNOSIS — K219 Gastro-esophageal reflux disease without esophagitis: Secondary | ICD-10-CM | POA: Diagnosis not present

## 2019-11-28 DIAGNOSIS — I951 Orthostatic hypotension: Secondary | ICD-10-CM | POA: Diagnosis not present

## 2019-11-28 NOTE — Telephone Encounter (Signed)
LMOVM to return call.

## 2019-11-29 DIAGNOSIS — I442 Atrioventricular block, complete: Secondary | ICD-10-CM | POA: Diagnosis not present

## 2019-11-29 DIAGNOSIS — I428 Other cardiomyopathies: Secondary | ICD-10-CM | POA: Diagnosis not present

## 2019-11-29 DIAGNOSIS — I951 Orthostatic hypotension: Secondary | ICD-10-CM | POA: Diagnosis not present

## 2019-11-29 DIAGNOSIS — I10 Essential (primary) hypertension: Secondary | ICD-10-CM | POA: Diagnosis not present

## 2019-11-29 DIAGNOSIS — E785 Hyperlipidemia, unspecified: Secondary | ICD-10-CM | POA: Diagnosis not present

## 2019-11-29 DIAGNOSIS — K219 Gastro-esophageal reflux disease without esophagitis: Secondary | ICD-10-CM | POA: Diagnosis not present

## 2019-11-29 DIAGNOSIS — K5909 Other constipation: Secondary | ICD-10-CM | POA: Diagnosis not present

## 2019-11-29 DIAGNOSIS — I447 Left bundle-branch block, unspecified: Secondary | ICD-10-CM | POA: Diagnosis not present

## 2019-11-29 DIAGNOSIS — I4819 Other persistent atrial fibrillation: Secondary | ICD-10-CM | POA: Diagnosis not present

## 2019-11-29 DIAGNOSIS — N132 Hydronephrosis with renal and ureteral calculous obstruction: Secondary | ICD-10-CM | POA: Diagnosis not present

## 2019-11-29 NOTE — Telephone Encounter (Signed)
Verbal order OK given to [facility] for speech therapy

## 2019-12-02 ENCOUNTER — Telehealth: Payer: Self-pay

## 2019-12-02 NOTE — Telephone Encounter (Signed)
Social worker with [facility] wanted to make PCP aware that her visit with Madison White was pushed back to next week.

## 2019-12-05 DIAGNOSIS — K219 Gastro-esophageal reflux disease without esophagitis: Secondary | ICD-10-CM | POA: Diagnosis not present

## 2019-12-05 DIAGNOSIS — K5909 Other constipation: Secondary | ICD-10-CM | POA: Diagnosis not present

## 2019-12-05 DIAGNOSIS — N132 Hydronephrosis with renal and ureteral calculous obstruction: Secondary | ICD-10-CM | POA: Diagnosis not present

## 2019-12-05 DIAGNOSIS — I10 Essential (primary) hypertension: Secondary | ICD-10-CM | POA: Diagnosis not present

## 2019-12-05 DIAGNOSIS — I447 Left bundle-branch block, unspecified: Secondary | ICD-10-CM | POA: Diagnosis not present

## 2019-12-05 DIAGNOSIS — I951 Orthostatic hypotension: Secondary | ICD-10-CM | POA: Diagnosis not present

## 2019-12-05 DIAGNOSIS — E785 Hyperlipidemia, unspecified: Secondary | ICD-10-CM | POA: Diagnosis not present

## 2019-12-05 DIAGNOSIS — I428 Other cardiomyopathies: Secondary | ICD-10-CM | POA: Diagnosis not present

## 2019-12-05 DIAGNOSIS — I4819 Other persistent atrial fibrillation: Secondary | ICD-10-CM | POA: Diagnosis not present

## 2019-12-05 DIAGNOSIS — I442 Atrioventricular block, complete: Secondary | ICD-10-CM | POA: Diagnosis not present

## 2019-12-06 DIAGNOSIS — I951 Orthostatic hypotension: Secondary | ICD-10-CM | POA: Diagnosis not present

## 2019-12-06 DIAGNOSIS — K219 Gastro-esophageal reflux disease without esophagitis: Secondary | ICD-10-CM | POA: Diagnosis not present

## 2019-12-06 DIAGNOSIS — I4819 Other persistent atrial fibrillation: Secondary | ICD-10-CM | POA: Diagnosis not present

## 2019-12-06 DIAGNOSIS — K5909 Other constipation: Secondary | ICD-10-CM | POA: Diagnosis not present

## 2019-12-06 DIAGNOSIS — I442 Atrioventricular block, complete: Secondary | ICD-10-CM | POA: Diagnosis not present

## 2019-12-06 DIAGNOSIS — I10 Essential (primary) hypertension: Secondary | ICD-10-CM | POA: Diagnosis not present

## 2019-12-06 DIAGNOSIS — N132 Hydronephrosis with renal and ureteral calculous obstruction: Secondary | ICD-10-CM | POA: Diagnosis not present

## 2019-12-06 DIAGNOSIS — E785 Hyperlipidemia, unspecified: Secondary | ICD-10-CM | POA: Diagnosis not present

## 2019-12-06 DIAGNOSIS — I428 Other cardiomyopathies: Secondary | ICD-10-CM | POA: Diagnosis not present

## 2019-12-06 DIAGNOSIS — I447 Left bundle-branch block, unspecified: Secondary | ICD-10-CM | POA: Diagnosis not present

## 2019-12-07 DIAGNOSIS — I428 Other cardiomyopathies: Secondary | ICD-10-CM | POA: Diagnosis not present

## 2019-12-07 DIAGNOSIS — I4819 Other persistent atrial fibrillation: Secondary | ICD-10-CM | POA: Diagnosis not present

## 2019-12-07 DIAGNOSIS — E785 Hyperlipidemia, unspecified: Secondary | ICD-10-CM | POA: Diagnosis not present

## 2019-12-07 DIAGNOSIS — I447 Left bundle-branch block, unspecified: Secondary | ICD-10-CM | POA: Diagnosis not present

## 2019-12-07 DIAGNOSIS — I442 Atrioventricular block, complete: Secondary | ICD-10-CM | POA: Diagnosis not present

## 2019-12-07 DIAGNOSIS — K219 Gastro-esophageal reflux disease without esophagitis: Secondary | ICD-10-CM | POA: Diagnosis not present

## 2019-12-07 DIAGNOSIS — I10 Essential (primary) hypertension: Secondary | ICD-10-CM | POA: Diagnosis not present

## 2019-12-07 DIAGNOSIS — K5909 Other constipation: Secondary | ICD-10-CM | POA: Diagnosis not present

## 2019-12-07 DIAGNOSIS — I951 Orthostatic hypotension: Secondary | ICD-10-CM | POA: Diagnosis not present

## 2019-12-07 DIAGNOSIS — N132 Hydronephrosis with renal and ureteral calculous obstruction: Secondary | ICD-10-CM | POA: Diagnosis not present

## 2019-12-11 DIAGNOSIS — I4819 Other persistent atrial fibrillation: Secondary | ICD-10-CM | POA: Diagnosis not present

## 2019-12-11 DIAGNOSIS — K5909 Other constipation: Secondary | ICD-10-CM | POA: Diagnosis not present

## 2019-12-11 DIAGNOSIS — E785 Hyperlipidemia, unspecified: Secondary | ICD-10-CM | POA: Diagnosis not present

## 2019-12-11 DIAGNOSIS — I428 Other cardiomyopathies: Secondary | ICD-10-CM | POA: Diagnosis not present

## 2019-12-11 DIAGNOSIS — I951 Orthostatic hypotension: Secondary | ICD-10-CM | POA: Diagnosis not present

## 2019-12-11 DIAGNOSIS — I10 Essential (primary) hypertension: Secondary | ICD-10-CM | POA: Diagnosis not present

## 2019-12-11 DIAGNOSIS — I442 Atrioventricular block, complete: Secondary | ICD-10-CM | POA: Diagnosis not present

## 2019-12-11 DIAGNOSIS — N132 Hydronephrosis with renal and ureteral calculous obstruction: Secondary | ICD-10-CM | POA: Diagnosis not present

## 2019-12-11 DIAGNOSIS — I447 Left bundle-branch block, unspecified: Secondary | ICD-10-CM | POA: Diagnosis not present

## 2019-12-11 DIAGNOSIS — K219 Gastro-esophageal reflux disease without esophagitis: Secondary | ICD-10-CM | POA: Diagnosis not present

## 2019-12-13 DIAGNOSIS — I951 Orthostatic hypotension: Secondary | ICD-10-CM | POA: Diagnosis not present

## 2019-12-13 DIAGNOSIS — I428 Other cardiomyopathies: Secondary | ICD-10-CM | POA: Diagnosis not present

## 2019-12-13 DIAGNOSIS — E785 Hyperlipidemia, unspecified: Secondary | ICD-10-CM | POA: Diagnosis not present

## 2019-12-13 DIAGNOSIS — K5909 Other constipation: Secondary | ICD-10-CM | POA: Diagnosis not present

## 2019-12-13 DIAGNOSIS — I10 Essential (primary) hypertension: Secondary | ICD-10-CM | POA: Diagnosis not present

## 2019-12-13 DIAGNOSIS — I447 Left bundle-branch block, unspecified: Secondary | ICD-10-CM | POA: Diagnosis not present

## 2019-12-13 DIAGNOSIS — I4819 Other persistent atrial fibrillation: Secondary | ICD-10-CM | POA: Diagnosis not present

## 2019-12-13 DIAGNOSIS — I442 Atrioventricular block, complete: Secondary | ICD-10-CM | POA: Diagnosis not present

## 2019-12-13 DIAGNOSIS — K219 Gastro-esophageal reflux disease without esophagitis: Secondary | ICD-10-CM | POA: Diagnosis not present

## 2019-12-13 DIAGNOSIS — N132 Hydronephrosis with renal and ureteral calculous obstruction: Secondary | ICD-10-CM | POA: Diagnosis not present

## 2019-12-14 DIAGNOSIS — E785 Hyperlipidemia, unspecified: Secondary | ICD-10-CM | POA: Diagnosis not present

## 2019-12-14 DIAGNOSIS — I951 Orthostatic hypotension: Secondary | ICD-10-CM | POA: Diagnosis not present

## 2019-12-14 DIAGNOSIS — I428 Other cardiomyopathies: Secondary | ICD-10-CM | POA: Diagnosis not present

## 2019-12-14 DIAGNOSIS — I4819 Other persistent atrial fibrillation: Secondary | ICD-10-CM | POA: Diagnosis not present

## 2019-12-14 DIAGNOSIS — K219 Gastro-esophageal reflux disease without esophagitis: Secondary | ICD-10-CM | POA: Diagnosis not present

## 2019-12-14 DIAGNOSIS — N132 Hydronephrosis with renal and ureteral calculous obstruction: Secondary | ICD-10-CM | POA: Diagnosis not present

## 2019-12-14 DIAGNOSIS — I447 Left bundle-branch block, unspecified: Secondary | ICD-10-CM | POA: Diagnosis not present

## 2019-12-14 DIAGNOSIS — I442 Atrioventricular block, complete: Secondary | ICD-10-CM | POA: Diagnosis not present

## 2019-12-14 DIAGNOSIS — I10 Essential (primary) hypertension: Secondary | ICD-10-CM | POA: Diagnosis not present

## 2019-12-14 DIAGNOSIS — K5909 Other constipation: Secondary | ICD-10-CM | POA: Diagnosis not present

## 2019-12-15 DIAGNOSIS — I447 Left bundle-branch block, unspecified: Secondary | ICD-10-CM | POA: Diagnosis not present

## 2019-12-15 DIAGNOSIS — I4819 Other persistent atrial fibrillation: Secondary | ICD-10-CM | POA: Diagnosis not present

## 2019-12-15 DIAGNOSIS — I442 Atrioventricular block, complete: Secondary | ICD-10-CM | POA: Diagnosis not present

## 2019-12-15 DIAGNOSIS — K219 Gastro-esophageal reflux disease without esophagitis: Secondary | ICD-10-CM | POA: Diagnosis not present

## 2019-12-15 DIAGNOSIS — I951 Orthostatic hypotension: Secondary | ICD-10-CM | POA: Diagnosis not present

## 2019-12-15 DIAGNOSIS — E785 Hyperlipidemia, unspecified: Secondary | ICD-10-CM | POA: Diagnosis not present

## 2019-12-15 DIAGNOSIS — I10 Essential (primary) hypertension: Secondary | ICD-10-CM | POA: Diagnosis not present

## 2019-12-15 DIAGNOSIS — I428 Other cardiomyopathies: Secondary | ICD-10-CM | POA: Diagnosis not present

## 2019-12-15 DIAGNOSIS — N132 Hydronephrosis with renal and ureteral calculous obstruction: Secondary | ICD-10-CM | POA: Diagnosis not present

## 2019-12-15 DIAGNOSIS — K5909 Other constipation: Secondary | ICD-10-CM | POA: Diagnosis not present

## 2019-12-19 ENCOUNTER — Telehealth: Payer: Self-pay

## 2019-12-19 NOTE — Telephone Encounter (Signed)
Called the patient granddaughter she said they are going to keep the appt. She is reaching out to another office also.

## 2019-12-19 NOTE — Telephone Encounter (Signed)
Madison White, end of note says pt going to Urgent Care. So is she coming tomorrow for [place] too?

## 2019-12-19 NOTE — Telephone Encounter (Signed)
Chief Complaint Groin pain Reason for Call Symptomatic / Request for Health Information Initial Comment Caller states she has groin pain. She had a DVT in her leg and needs to make sure this isn't another one. Translation No Nurse Assessment Nurse: Raphael Gibney, RN, Vanita Ingles Date/Time (Eastern Time): 12/19/2019 9:08:46 AM Confirm and document reason for call. If symptomatic, describe symptoms. ---Caller states her grandmother has left groin pain. History of DVT in her left leg. no swelling or swelling. Does the patient have any new or worsening symptoms? ---Yes Will a triage be completed? ---Yes Related visit to physician within the last 2 weeks? ---No Does the PT have any chronic conditions? (i.e. diabetes, asthma, this includes High risk factors for pregnancy, etc.) ---Yes List chronic conditions. ---DVT; heart problems; A fib; pacemaker Is this a behavioral health or substance abuse call? ---No Guidelines Guideline Title Affirmed Question Affirmed Notes Nurse Date/Time Eilene Ghazi Time) Leg Pain History of prior "blood clot" in leg or lungs (i.e., deep vein thrombosis, pulmonary embolism) Raphael Gibney, RN, Vanita Ingles 12/19/2019 9:13:33 AM Disp. Time Eilene Ghazi Time) Disposition Final User 12/19/2019 9:18:46 AM See HCP within 4 Hours (or PCP triage) Yes Raphael Gibney, RN, VeraPLEASE NOTE: All timestamps contained within this report are represented as [place] Standard Time. CONFIDENTIALTY NOTICE: This fax transmission is intended only for the addressee. It contains information that is legally privileged, confidential or otherwise protected from use or disclosure. If you are not the intended recipient, you are strictly prohibited from reviewing, disclosing, copying using or disseminating any of this information or taking any action in reliance on or regarding this information. If you have received this fax in error, please notify us immediately by telephone so that we can arrange for its return to [place]. Phone:  [phone], Toll-Free: [phone], Fax: [phone] Page: 2 of 2 Call Id: [id number] [place] Disagree/Comply Comply Caller Understands Yes PreDisposition Call Doctor Care Advice Given Per Guideline * IF OFFICE WILL BE OPEN: You need to be seen within the next 3 or 4 hours. Call your doctor (or NP/PA) now or as soon as the office opens. SEE HCP (OR PCP TRIAGE) WITHIN 4 HOURS: CALL BACK IF: * You become worse CARE ADVICE given per Leg Pain (Adult) guideline. Comments User: Dannielle Burn, RN Date/Time Eilene Ghazi Time): 12/19/2019 9:18:15 AM Called back line and no appts available within 4 hrs. Advised caller to go to urgent care. States she will take her grandmother to one. Referrals GO TO FACILITY UNDECIDE

## 2019-12-20 ENCOUNTER — Ambulatory Visit (INDEPENDENT_AMBULATORY_CARE_PROVIDER_SITE_OTHER): Payer: Medicare HMO | Admitting: Physician Assistant

## 2019-12-20 ENCOUNTER — Other Ambulatory Visit: Payer: Self-pay

## 2019-12-20 ENCOUNTER — Encounter: Payer: Self-pay | Admitting: Physician Assistant

## 2019-12-20 VITALS — BP 140/80 | HR 76 | Temp 97.0°F | Ht 61.0 in | Wt 155.5 lb

## 2019-12-20 DIAGNOSIS — K219 Gastro-esophageal reflux disease without esophagitis: Secondary | ICD-10-CM | POA: Diagnosis not present

## 2019-12-20 DIAGNOSIS — N132 Hydronephrosis with renal and ureteral calculous obstruction: Secondary | ICD-10-CM | POA: Diagnosis not present

## 2019-12-20 DIAGNOSIS — I428 Other cardiomyopathies: Secondary | ICD-10-CM | POA: Diagnosis not present

## 2019-12-20 DIAGNOSIS — D6869 Other thrombophilia: Secondary | ICD-10-CM

## 2019-12-20 DIAGNOSIS — I951 Orthostatic hypotension: Secondary | ICD-10-CM | POA: Diagnosis not present

## 2019-12-20 DIAGNOSIS — R1032 Left lower quadrant pain: Secondary | ICD-10-CM | POA: Diagnosis not present

## 2019-12-20 DIAGNOSIS — K5909 Other constipation: Secondary | ICD-10-CM | POA: Diagnosis not present

## 2019-12-20 DIAGNOSIS — E785 Hyperlipidemia, unspecified: Secondary | ICD-10-CM | POA: Diagnosis not present

## 2019-12-20 DIAGNOSIS — I447 Left bundle-branch block, unspecified: Secondary | ICD-10-CM | POA: Diagnosis not present

## 2019-12-20 DIAGNOSIS — I10 Essential (primary) hypertension: Secondary | ICD-10-CM | POA: Diagnosis not present

## 2019-12-20 DIAGNOSIS — I4819 Other persistent atrial fibrillation: Secondary | ICD-10-CM | POA: Diagnosis not present

## 2019-12-20 DIAGNOSIS — I442 Atrioventricular block, complete: Secondary | ICD-10-CM | POA: Diagnosis not present

## 2019-12-20 NOTE — Progress Notes (Signed)
Madison White is a 86 y.o. female here for a new problem.  I acted as a Education administrator for [employer], PA-C Anselmo Pickler, LPN   History of Present Illness:   Chief Complaint  Patient presents with  . Groin Pain    HPI   Groin pain; DVT hx --10/27/19 ER -- diagnosed with an acute LLE DVT. She was discharged home on therapeutic lovenox injections BID. She was on eliquis prior to DVT for afib. Per cardiology note, "Daughter reports that prior to Ms. Kimmer's DVT diagnosis, the patient had been staying with her for the week and, unbeknownst to her, did not have her apixaban in her pillbox for at least 3-4 days." --10/28/19 vascular surgery -- recommended to continue lovenox injections x3 months and follow-up with cardiology for ongoing anticoagulation recommendations --10/31/19 ER -- for worsening SOB and fatigue. CTA chest did not show any PE but noted small groundglass nodular infiltrates of the posterior lateral RUL c/f infectious or inflammatory process recommended for repeat study in 3 months.  Labs were notable for Hgb 10.0, down from 12.9 07/2019, though she has no complaints of bleeding. BNP was 216 10/25/19.  --11/16/19 cardiology f/u -- was taken off lovenox by Roby Lofts "She was transitioned to therapeutic lovenox BID, though given this is not failed eliquis would be reasonable to transition back to [place] now. Xarelto was not covered by insurance so will resume apixaban 5mg  BID." Patient reports that she completed her remaining lovenox that she had and has been off of this.  Patient is present today because she is having groin pain since 9/30. Pain is 8-9/10. Ongoing SOB.  --Referred to hematology--> I reviewed with family and do not see where this was done?  Past Medical History:  Diagnosis Date  . Acute lower GI bleeding   . Diastolic dysfunction, left ventricle   . GERD (gastroesophageal reflux disease)   . Headache disorder 2016   R frontoparietal pain, episodic (paroxysmal  hemicrania vs trig neuralgia of ophth br of CN V)--MRI brain 11/2014 showed age related changes but no explanation for her HA's.  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  [name] History of blood transfusion   . History of rheumatic fever   . HTN (hypertension)    hx of refusing treatment--White coat HTN and/or situational HTN (?)   . Hyperlipidemia    hx of refusing treatment  . Insulin resistance    A1c's excellent per old records (6.1 in 2008 and 2009)  . Left bundle branch block 12/2011   Dr. Sallyanne Kuster at [facility]; ECHO and myocardial perfusion scan showed  septal and apical wall motion abnormality but she had no significant valvular disease and no ischemia.  She did have mildly decreased EF (39% on lexiscan and 50% on echo) and abnl LV relaxation.  Mild amount of PVCs.  Pt at higher risk for other conduction abnormalities, good chance of eventually requiring a pacemaker.   [name] LVH (left ventricular hypertrophy)   . Nonischemic cardiomyopathy ([place]) 2016   LV dysfunction due to LBBB/septal dyssynchrony  . Osteoarthritis    bilat knees--needs bilat TKA.  Ortho is trying steroid injections as of 10/23/15.  . Presence of biventricular cardiac pacemaker 04/05/2019  . Presence of permanent cardiac pacemaker 05/05/2017  . Seasonal allergies   . Solitary pulmonary nodule 05/2016   8 mm pleural based nodule in RLL.  Radiology recommended f/u noncontrast CT in 6-12 mo.     Social History  Tobacco Use  . Smoking status: Former Smoker    Quit date: 05/31/1958    Years since quitting: 61.5  . Smokeless tobacco: Never Used  Vaping Use  . Vaping Use: Never used  Substance Use Topics  . Alcohol use: Yes    Alcohol/week: 1.0 standard drink    Types: 1 Glasses of wine per week  . Drug use: No    Past Surgical History:  Procedure Laterality Date  . ABDOMINAL HYSTERECTOMY  1963   At the time of her last C/S; says she had partial bladder resection at that time as well  .  BI-VENTRICULAR PACEMAKER INSERTION (CRT-P)  05/05/2017  . BIV PACEMAKER INSERTION CRT-P N/A 05/05/2017   Procedure: BIV PACEMAKER INSERTION CRT-P;  Surgeon: Evans Lance, MD;  Location: [place] [facility];  Service: Cardiovascular;  Laterality: N/A;  . CARDIOVASCULAR STRESS TEST  08/2005 & 12/2011   Low risk scans (on the 12/2011 scan she did have EF 39% with moderately severe LV dysfunction with septal dyssynergy probably contributed by LBBB  . CARDIOVERSION N/A 08/09/2019   Procedure: CARDIOVERSION;  Surgeon: Pixie Casino, MD;  Location[facility] ENDOSCOPY;  Service: Cardiovascular;  Laterality: N/A;  . Carotid dopplers  11/22/14; 11/30/16   NORMAL 2016 and 2018  . CATARACT EXTRACTION  08/14/11   both  . CESAREAN SECTION  X 5   One of her neonates died soon after birth  . CHOLECYSTECTOMY     1980's  . COLONOSCOPY WITH PROPOFOL N/A 09/02/2017   Procedure: COLONOSCOPY WITH PROPOFOL;  Surgeon: Ladene Artist, MD;  Location: [facility] ENDOSCOPY;  Service: Endoscopy;  Laterality: N/A;  . HOT HEMOSTASIS N/A 09/02/2017   Procedure: HOT HEMOSTASIS (ARGON PLASMA COAGULATION/BICAP);  Surgeon: Ladene Artist, MD;  Location: [facility] ENDOSCOPY;  Service: Endoscopy;  Laterality: N/A;  . INSERT / REPLACE / REMOVE PACEMAKER    . JOINT REPLACEMENT Bilateral    knee  . right knee surgery Right 05/2016   ? R TKA: no records.  [name] STRABISMUS SURGERY  1939  . TONSILLECTOMY  1938  . TRANSTHORACIC ECHOCARDIOGRAM  01/13/12   Septal dyssynergy, EF 50%, LV relaxation impaired.  No significant valvular abnormalities.    Family History  Problem Relation Age of Onset  . Hypertension Mother   . Heart disease Mother   . Hyperlipidemia Mother   . Diabetes Mother   . Heart disease Father   . Cancer Father     Allergies  Allergen Reactions  . Penicillins Hives and Swelling    Has patient had a PCN reaction causing immediate rash, facial/tongue/throat swelling, SOB or lightheadedness with hypotension: Yes Has patient had a  PCN reaction causing severe rash involving mucus membranes or skin necrosis: No Has patient had a PCN reaction that required hospitalization: No Has patient had a PCN reaction occurring within the last 10 years: No If all of the above answers are "NO", then may proceed with Cephalosporin use.   . Nickel Swelling and Rash  . Avapro [Irbesartan] Other (See Comments)    Unknown rxn  . Lisinopril Swelling    Swelling around eyes; also says it caused increased sugar and BP  . Simvastatin Other (See Comments)    unknown  . Latex Rash  . Sulfa Antibiotics Rash  . Sulfamethoxazole Rash    Current Medications:   Current Outpatient Medications:  .  amLODipine (NORVASC) 5 MG tablet, Take 1 tablet (5 mg total) by mouth daily., Disp: 30 tablet, Rfl: 11 .  apixaban (  ELIQUIS) 5 MG TABS tablet, Take 1 tablet (5 mg total) by mouth 2 (two) times daily., Disp: 180 tablet, Rfl: 3 .  calcium-vitamin D (OSCAL WITH D) 500-200 MG-UNIT tablet, Take 1 tablet by mouth., Disp: , Rfl:  .  cyanocobalamin 100 MCG tablet, Take by mouth., Disp: , Rfl:  .  Iron-Vitamin C 100-250 MG TABS, Take by mouth., Disp: , Rfl:  .  metoprolol tartrate (LOPRESSOR) 25 MG tablet, TAKE 1 TABLET BY MOUTH TWICE A DAY, Disp: 180 tablet, Rfl: 0 .  metoprolol tartrate (LOPRESSOR) 25 MG tablet, Take 1 tablet (25 mg total) by mouth 2 (two) times daily., Disp: 180 tablet, Rfl: 3 .  Multiple Vitamin (MULTIVITAMIN) tablet, Take 1 tablet by mouth daily. , Disp: , Rfl:  .  omeprazole (PRILOSEC) 20 MG capsule, TAKE 1 CAPSULE BY MOUTH EVERY DAY, Disp: 90 capsule, Rfl: 1 .  PEDIATRIC MULTIVITAMINS-IRON PO, Take by mouth., Disp: , Rfl:    Review of Systems:   ROS  Negative unless otherwise specified per HPI.  Vitals:   Vitals:   12/20/19 1133  BP: 140/80  Pulse: 76  Temp: (!) 97 F (36.1 C)  TempSrc: Temporal  SpO2: 95%  Weight: 155 lb 8 oz (70.5 kg)  Height: 5\' 1"  (1.549 m)     Body mass index is 29.38 kg/m.  Physical Exam:    Physical Exam Vitals and nursing note reviewed.  Constitutional:      General: She is not in acute distress.    Appearance: She is well-developed. She is not ill-appearing or toxic-appearing.  Cardiovascular:     Rate and Rhythm: Normal rate and regular rhythm.     Pulses: Normal pulses.          Dorsalis pedis pulses are 2+ on the right side and 2+ on the left side.       Posterior tibial pulses are 2+ on the right side and 2+ on the left side.     Heart sounds: Normal heart sounds, S1 normal and S2 normal.  Pulmonary:     Effort: Pulmonary effort is normal.     Breath sounds: Normal breath sounds.  Musculoskeletal:     Right lower leg: 1+ Edema present.  Skin:    General: Skin is warm and dry.  Neurological:     Mental Status: She is alert.     GCS: GCS eye subscore is 4. GCS verbal subscore is 5. GCS motor subscore is 6.  Psychiatric:        Speech: Speech normal.        Behavior: Behavior normal. Behavior is cooperative.      Assessment and Plan:   Genieve was seen today for groin pain.  Diagnoses and all orders for this visit:  Left groin pain Stat U/S for further evaluation. Worsening precautions advised -- if any concerns for chest pain, SOB, worsening pain, she was instructed to go to the ER. -     VAS [place] LOWER EXTREMITY VENOUS (DVT); Future  Secondary hypercoagulable state [facility]) -     Ambulatory referral to Hematology  CMA or LPN served as scribe during this visit. History, Physical, and Plan performed by medical provider. The above documentation has been reviewed and is accurate and complete.  Time spent with patient today was 30 minutes which consisted of chart review, discussing diagnosis, work up, treatment answering questions and documentation.   Inda Coke, PA-C

## 2019-12-20 NOTE — Patient Instructions (Addendum)
It was great to see you!  We are putting in an urgent referral to a hematologist so they can evaluate why you are having blood clots while taking blood thinners. This will help [place] figure out what the best blood thinner medication is for you, why you are having clots and next steps.  Please get your ultrasound as ordered. We will be in touch with these results.  If any chest pain, severe worsening shortness of breath, or other concerns, please do not delay care and go to the ER.  Take care,  Inda Coke PA-C

## 2019-12-21 ENCOUNTER — Ambulatory Visit (HOSPITAL_COMMUNITY)
Admission: RE | Admit: 2019-12-21 | Discharge: 2019-12-21 | Disposition: A | Discharge status: Home / Self Care | Payer: Medicare HMO | Source: Ambulatory Visit | Attending: Physician Assistant | Admitting: Physician Assistant

## 2019-12-21 ENCOUNTER — Telehealth: Payer: Self-pay | Admitting: *Deleted

## 2019-12-21 DIAGNOSIS — R1032 Left lower quadrant pain: Secondary | ICD-10-CM | POA: Insufficient documentation

## 2019-12-21 NOTE — Telephone Encounter (Signed)
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 Samantha given report. Told Erica per Aldona Bar to tell pt that she is Negative for DVT and we will call her later. Erica verbalized understanding.

## 2019-12-21 NOTE — Telephone Encounter (Signed)
Aldona Bar, see report.

## 2019-12-22 ENCOUNTER — Telehealth: Payer: Self-pay | Admitting: Hematology and Oncology

## 2019-12-22 NOTE — Telephone Encounter (Signed)
Received a new pt referral from Inda Coke for secondary hypercoagulable state. [name] has been scheduled to see Dr. Lorenso Courier on 10/13 at [place] date and time has been given to the pt's granddaughter Cyril Mourning. Aware to arrive 20 minutes early.

## 2019-12-23 ENCOUNTER — Other Ambulatory Visit: Payer: Self-pay | Admitting: Physician Assistant

## 2019-12-23 ENCOUNTER — Telehealth: Payer: Self-pay | Admitting: Hematology and Oncology

## 2019-12-23 ENCOUNTER — Telehealth: Payer: Self-pay

## 2019-12-23 DIAGNOSIS — I824Y2 Acute embolism and thrombosis of unspecified deep veins of left proximal lower extremity: Secondary | ICD-10-CM

## 2019-12-23 MED ORDER — TRAMADOL HCL 50 MG PO TABS: 50.0000 mg | ORAL_TABLET | Freq: Three times a day (TID) | ORAL | 0 refills | Status: AC | PRN

## 2019-12-23 NOTE — Telephone Encounter (Signed)
Called Sherri back told her was looking in the chart and it is Vascular Surgeon she saw at [facility]. I will put referral in for Vascular Surgeon here with [facility]. Sherri verbalized understanding.

## 2019-12-23 NOTE — Telephone Encounter (Signed)
Message from [place], Mikhaila Roh called saying pt is having 7/10 pain with walking, 3/10 when sitting, SOB when ambulating and wants pain addressed. Also swelling in leg.   Discussed with Aldona Bar she said see if pt wants pain medication and pt is seeing Hematology on Wednesday 10/13.

## 2019-12-23 NOTE — Telephone Encounter (Signed)
Nurse Assessment Nurse: Aris Lot, RN, Christina Date/Time (Eastern Time): 12/22/2019 6:36:17 PM Confirm and document reason for call. If symptomatic, describe symptoms. ---Caller states she recently took her grandmother to office regarding her DVT in her leg that's causing her a lot of pain and the DVT has been confirmed with the ultrasound. Grandmother has an appointment with the hematologist next Wednesday, 10/13. Caller stated that pain is 8-10. Caller stated that she has shortness of breath. No other symptoms Does the patient have any new or worsening symptoms? ---Yes Will a triage be completed? ---Yes Related visit to physician within the last 2 weeks? ---Yes Does the PT have any chronic conditions? (i.e. diabetes, asthma, this includes High risk factors for pregnancy, etc.) ---Yes List chronic conditions. ---pacemaker, afib Is this a behavioral health or substance abuse call? ---No Guidelines Guideline Title Affirmed Question Affirmed Notes Nurse Date/Time Eilene Ghazi Time) Leg Swelling and Edema [1] Difficulty breathing with exertion (e.g., walking) AND [2] newonset or worsening Julious Payer 12/22/2019 6:39:45 PM PLEASE NOTE: All timestamps contained within this report are represented as [place] Standard Time. CONFIDENTIALTY NOTICE: This fax transmission is intended only for the addressee. It contains information that is legally privileged, confidential or otherwise protected from use or disclosure. If you are not the intended recipient, you are strictly prohibited from reviewing, disclosing, copying using or disseminating any of this information or taking any action in reliance on or regarding this information. If you have received this fax in error, please notify us immediately by telephone so that we can arrange for its return to [place]. Phone: [phone], Toll-Free: [phone], Fax: [phone] Page: 2 of 2 Call Id: [id number] [place]. Time Eilene Ghazi Time) Disposition Final  User 12/22/2019 6:45:31 PM Paged On Call back to [facility] Aris Lot, RN, Margreta Journey 12/22/2019 7:06:01 PM Paged On Call back to [facility], RN, Margreta Journey 12/22/2019 6:42:39 PM Go to ED Now (or PCP triage) Yes Aris Lot, RN, Willeen Cass Disagree/Comply Comply Caller Understands Yes PreDisposition InappropriateToAsk Care Advice Given Per Guideline GO TO ED NOW (OR PCP TRIAGE): ANOTHER ADULT SHOULD DRIVE: * It is better and safer if another adult drives instead of you. Comments User: Isidore Moos, RN Date/Time Eilene Ghazi Time): 12/22/2019 7:23:47 PM Notified patient care taker of on call instructions and patient care taker stated that patient dose not want to got to the ED Referrals GO TO [place] UNDECIDED Paging DoctorName Phone DateTime Result/Outcome Message Type Notes Scarlette Calico - MD [phone] 12/22/2019 6:45:30 PM Paged On Call Back to Call Center Doctor Paged Please call Isidore Moos RN [phone] Scarlette Calico - MD [phone] 12/22/2019 7:06:01 PM Paged On Call Back to Call Center Doctor Paged Scarlette Calico - MD 12/22/2019 7:20:16 PM Spoke with On Call - General Message Result on call stated to have patient ev

## 2019-12-23 NOTE — Telephone Encounter (Signed)
Called Sherri back told her Aldona Bar has sent in Tramadol for pt to [facility]. Sherri verbalized understanding. Also Venida Jarvis said that she needs a Cardiovascular referral not Cardiology that she was corrected by Cyril Mourning. Told her I think they are the same I will check with Aldona Bar and then put correct referral in.

## 2019-12-23 NOTE — Telephone Encounter (Signed)
I received a call from the pt's daughter-in-law [name] to see about getting [name] sooner than the 13th. Ms. [name] is a current pt of Dr. Lindi Adie. I rescheduled Madison White to see Dr. Lindi Adie on 10/12 at 415pm.

## 2019-12-23 NOTE — Telephone Encounter (Signed)
Tramadol sent. 

## 2019-12-23 NOTE — Addendum Note (Signed)
Addended by: Marian Sorrow on: 12/23/2019 05:11 PM   Modules accepted: Orders

## 2019-12-23 NOTE — Telephone Encounter (Signed)
Called Madison White, asked her how I can help? Madison White said Pt has a lot going on with family and her care. Madison White said left leg was swollen last night but swelling is gone this morning. Pt is having pain with walking 7/10 and some SOB but does subside when pt sits back down pain is 3/10. Asked Madison White if she would like something for pain? Madison White said she thinks that would be good. Also Madison White said pt would like a referral to Cardiology so all her if care is thru [facility]. Told her we can gladly put a referral in and someone will contact her about an appt. Madison White verbalized understanding. Told Madison White I will discuss with Aldona Bar about pain medication and get back to her. Madison White verbalized understanding.

## 2019-12-23 NOTE — Telephone Encounter (Signed)
FYI

## 2019-12-26 NOTE — Progress Notes (Signed)
Post NOTE  Patient Care Team: Leamon Arnt, MD as PCP - General (Family Medicine) Croitoru, Dani Gobble, MD as PCP - Cardiology (Cardiology) Evans Lance, MD as PCP - Electrophysiology (Cardiology) Croitoru, Dani Gobble, MD as Consulting Physician (Cardiology) Pieter Partridge, DO as Consulting Physician (Neurology) Ninetta Lights, MD (Inactive) as Consulting Physician (Orthopedic Surgery) Gaynelle Arabian, MD as Consulting Physician (Orthopedic Surgery) Crista Luria, MD as Consulting Physician (Dermatology) [place] (Optometry) Nolon Lennert (Dentistry) Lavonna Monarch, MD as Consulting Physician (Dermatology) Myrlene Broker, MD as Attending Physician (Urology)  CHIEF COMPLAINTS/PURPOSE OF CONSULTATION:  Newly diagnosed secondary hypercoagulable state  HISTORY OF PRESENTING ILLNESS:  Madison White 86 y.o. female is here because of recent diagnosis of secondary hypercoagulable state. She is referred by Inda Coke, PA, her PCP. On 10/27/19 she was diagnosed with an acute left lower extremity DVT. She was previously on Eliquis for afib but had not taken it in the 3-4 days prior, and was discharged on Lovenox injections BID. On 11/16/19 she was transitioned back to Eliquis 5mg  BID. She presents to the clinic today for initial evaluation.   She has not had any recent falls.  There is no family history of blood clots.  Her atrial fibrillation is intermittent in nature.  I reviewed her records extensively and collaborated the history with the patient.  MEDICAL HISTORY:  Past Medical History:  Diagnosis Date  . Acute lower GI bleeding   . Diastolic dysfunction, left ventricle   . GERD (gastroesophageal reflux disease)   . 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  Neuro dx'd pt with primary stabbing HA's and she  improved on neurontin.  [name] History of blood transfusion   . History of rheumatic fever   . HTN (hypertension)    hx of refusing treatment--White coat HTN and/or situational HTN (?)   . Hyperlipidemia    hx of refusing treatment  . Insulin resistance    A1c's excellent per old records (6.1 in 2008 and 2009)  . Left bundle branch block 12/2011   Dr. Sallyanne Kuster at [facility]; ECHO and myocardial perfusion scan showed  septal and apical wall motion abnormality but she had no significant valvular disease and no ischemia.  She did have mildly decreased EF (39% on lexiscan and 50% on echo) and abnl LV relaxation.  Mild amount of PVCs.  Pt at higher risk for other conduction abnormalities, good chance of eventually requiring a pacemaker.   [name] LVH (left ventricular hypertrophy)   . Nonischemic cardiomyopathy ([place]) 2016   LV dysfunction due to LBBB/septal dyssynchrony  . Osteoarthritis    bilat knees--needs bilat TKA.  Ortho is trying steroid injections as of 10/23/15.  . Presence of biventricular cardiac pacemaker 04/05/2019  . Presence of permanent cardiac pacemaker 05/05/2017  . Seasonal allergies   . Solitary pulmonary nodule 05/2016   8 mm pleural based nodule in RLL.  Radiology recommended f/u noncontrast CT in 6-12 mo.    SURGICAL HISTORY: Past Surgical History:  Procedure Laterality Date  . ABDOMINAL HYSTERECTOMY  1963   At the time of her last C/S; says she had partial bladder resection at that time as well  . BI-VENTRICULAR PACEMAKER INSERTION (CRT-P)  05/05/2017  . BIV PACEMAKER INSERTION CRT-P N/A 05/05/2017   Procedure: BIV PACEMAKER INSERTION CRT-P;  Surgeon: Evans Lance, MD;  Location: [facility];  Service: Cardiovascular;  Laterality: N/A;  . CARDIOVASCULAR STRESS TEST  08/2005 & 12/2011   Low risk scans (on the 12/2011 scan she did have EF 39% with moderately severe LV dysfunction with septal dyssynergy probably contributed by LBBB  . CARDIOVERSION N/A 08/09/2019   Procedure:  CARDIOVERSION;  Surgeon: Pixie Casino, MD;  Location: [facility];  Service: Cardiovascular;  Laterality: N/A;  . Carotid dopplers  11/22/14; 11/30/16   NORMAL 2016 and 2018  . CATARACT EXTRACTION  08/14/11   both  . CESAREAN SECTION  X 5   One of her neonates died soon after birth  . CHOLECYSTECTOMY     1980's  . COLONOSCOPY WITH PROPOFOL N/A 09/02/2017   Procedure: COLONOSCOPY WITH PROPOFOL;  Surgeon: Ladene Artist, MD;  Location: [facility] ENDOSCOPY;  Service: Endoscopy;  Laterality: N/A;  . HOT HEMOSTASIS N/A 09/02/2017   Procedure: HOT HEMOSTASIS (ARGON PLASMA COAGULATION/BICAP);  Surgeon: Ladene Artist, MD;  Location: [facility] ENDOSCOPY;  Service: Endoscopy;  Laterality: N/A;  . INSERT / REPLACE / REMOVE PACEMAKER    . JOINT REPLACEMENT Bilateral    knee  . right knee surgery Right 05/2016   ? R TKA: no records.  [name] STRABISMUS SURGERY  1939  . TONSILLECTOMY  1938  . TRANSTHORACIC ECHOCARDIOGRAM  01/13/12   Septal dyssynergy, EF 50%, LV relaxation impaired.  No significant valvular abnormalities.    SOCIAL HISTORY: Social History   Socioeconomic History  . Marital status: Widowed    Spouse name: Not on file  . Number of children: Not on file  . Years of education: Not on file  . Highest education level: Not on file  Occupational History  . Not on file  Tobacco Use  . Smoking status: Former Smoker    Quit date: 05/31/1958    Years since quitting: 61.6  . Smokeless tobacco: Never Used  Vaping Use  . Vaping Use: Never used  Substance and Sexual Activity  . Alcohol use: Yes    Alcohol/week: 1.0 standard drink    Types: 1 Glasses of wine per week  . Drug use: No  . Sexual activity: Not on file  Other Topics Concern  . Not on file  Social History Narrative   Widower, husband died around 86 (wrongful death per pt's report).   Originally from [place], has been in [place] since about 1997.   Lives with daughter  Former Optometrist and lay pastor-retired.  Enjoys Leisure centre manager.   Distant hx of tobacco abuse.  No alcohol or drugs.   Social Determinants of Health   Financial Resource Strain:   . Difficulty of Paying Living Expenses: Not on file  Food Insecurity:   . Worried About Charity fundraiser in the Last Year: Not on file  . Ran Out of Food in the Last Year: Not on file  Transportation Needs:   . Lack of Transportation (Medical): Not on file  . Lack of Transportation (Non-Medical): Not on file  Physical Activity:   . Days of Exercise per Week: Not on file  . Minutes of Exercise per Session: Not on file  Stress:   . Feeling of Stress : Not on file  Social Connections:   . Frequency of Communication with Friends and Family: Not on file  . Frequency of Social Gatherings with Friends and Family: Not on file  . Attends Religious Services: Not on file  . Active Member of Clubs or Organizations: Not on  file  . Attends Archivist Meetings: Not on file  . Marital Status: Not on file  Intimate Partner Violence:   . Fear of Current or Ex-Partner: Not on file  . Emotionally Abused: Not on file  . Physically Abused: Not on file  . Sexually Abused: Not on file    FAMILY HISTORY: Family History  Problem Relation Age of Onset  . Hypertension Mother   . Heart disease Mother   . Hyperlipidemia Mother   . Diabetes Mother   . Heart disease Father   . Cancer Father     ALLERGIES:  is allergic to penicillins, nickel, avapro [irbesartan], lisinopril, simvastatin, latex, sulfa antibiotics, and sulfamethoxazole.  MEDICATIONS:  Current Outpatient Medications  Medication Sig Dispense Refill  . amLODipine (NORVASC) 5 MG tablet Take 1 tablet (5 mg total) by mouth daily. 30 tablet 11  . apixaban (ELIQUIS) 5 MG TABS tablet Take 1 tablet (5 mg total) by mouth 2 (two) times daily. 180 tablet 3  . calcium-vitamin D (OSCAL WITH D) 500-200 MG-UNIT tablet Take 1 tablet by mouth.    . cyanocobalamin 100 MCG tablet Take by mouth.    . Iron-Vitamin C  100-250 MG TABS Take by mouth.    . metoprolol tartrate (LOPRESSOR) 25 MG tablet TAKE 1 TABLET BY MOUTH TWICE A DAY 180 tablet 0  . metoprolol tartrate (LOPRESSOR) 25 MG tablet Take 1 tablet (25 mg total) by mouth 2 (two) times daily. 180 tablet 3  . Multiple Vitamin (MULTIVITAMIN) tablet Take 1 tablet by mouth daily.     [name] omeprazole (PRILOSEC) 20 MG capsule TAKE 1 CAPSULE BY MOUTH EVERY DAY 90 capsule 1  . PEDIATRIC MULTIVITAMINS-IRON PO Take by mouth.    . traMADol (ULTRAM) 50 MG tablet Take 1 tablet (50 mg total) by mouth every 8 (eight) hours as needed for up to 5 days. 15 tablet 0   No current facility-administered medications for this visit.    REVIEW OF SYSTEMS:     All other systems were reviewed with the patient and are negative.  PHYSICAL EXAMINATION: ECOG PERFORMANCE STATUS: 1 - Symptomatic but completely ambulatory  There were no vitals filed for this visit. There were no vitals filed for this visit.     LABORATORY DATA:  I have reviewed the data as listed Lab Results  Component Value Date   WBC 8.4 10/31/2019   HGB 10.0 (A) 10/31/2019   HCT 33 (A) 10/31/2019   MCV 78.1 (L) 10/25/2019   PLT 380 10/31/2019   Lab Results  Component Value Date   NA 139 10/31/2019   K 3.9 10/31/2019   CL 104 10/31/2019   CO2 28 (A) 10/31/2019    RADIOGRAPHIC STUDIES: I have personally reviewed the radiological reports and agreed with the findings in the report.  ASSESSMENT AND PLAN:  Acute deep vein thrombosis (DVT) of left lower extremity ([place])  referred by Inda Coke, PA, her PCP. On 10/27/19 she was diagnosed with an acute left lower extremity DVT. She was previously on Eliquis for afib but had not taken it in the 3-4 days prior, and was discharged on Lovenox injections BID. 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  No DVT of lower extremity  Current treatment: Eliquis 5 mg p.o. twice daily Plan:  Obtain lupus anticoagulant testing. If she is positive for lupus anticoagulant then she will need lifelong anticoagulation. She may need to remain on anticoagulation anyway for her history of atrial fibrillation.  I will call the patient's family with the result of the lupus anticoagulant testing. It does not change her treatment plan.  She will remain on anticoagulation for life as long as the risks versus benefits are in favor of the benefit. If she were to have frequent falls and we may have to stop anticoagulation.  We will see her on an as-needed basis. All questions were answered. The patient knows to call the clinic with any problems, questions or concerns.   Rulon Eisenmenger, MD, MPH 12/27/2019    I, Molly Dorshimer, am acting as scribe for Nicholas Lose, MD.  I have reviewed the above documentation for accuracy and completeness, and I agree with the above.

## 2019-12-27 ENCOUNTER — Other Ambulatory Visit: Payer: Self-pay

## 2019-12-27 ENCOUNTER — Inpatient Hospital Stay: Payer: Medicare HMO | Attending: Hematology and Oncology | Admitting: Hematology and Oncology

## 2019-12-27 DIAGNOSIS — Z79899 Other long term (current) drug therapy: Secondary | ICD-10-CM | POA: Insufficient documentation

## 2019-12-27 DIAGNOSIS — I82592 Chronic embolism and thrombosis of other specified deep vein of left lower extremity: Secondary | ICD-10-CM | POA: Insufficient documentation

## 2019-12-27 DIAGNOSIS — Z7901 Long term (current) use of anticoagulants: Secondary | ICD-10-CM | POA: Diagnosis not present

## 2019-12-27 DIAGNOSIS — Z950 Presence of cardiac pacemaker: Secondary | ICD-10-CM | POA: Insufficient documentation

## 2019-12-27 DIAGNOSIS — I4891 Unspecified atrial fibrillation: Secondary | ICD-10-CM | POA: Insufficient documentation

## 2019-12-27 DIAGNOSIS — I82402 Acute embolism and thrombosis of unspecified deep veins of left lower extremity: Secondary | ICD-10-CM | POA: Insufficient documentation

## 2019-12-27 DIAGNOSIS — I824Y2 Acute embolism and thrombosis of unspecified deep veins of left proximal lower extremity: Secondary | ICD-10-CM | POA: Diagnosis not present

## 2019-12-27 DIAGNOSIS — Z86718 Personal history of other venous thrombosis and embolism: Secondary | ICD-10-CM | POA: Insufficient documentation

## 2019-12-27 IMAGING — MG DIGITAL SCREENING BILATERAL MAMMOGRAM WITH TOMO AND CAD
2 series · 2 of 10 positions shown · non-contrast
Comparison: Previous exam(s).

CLINICAL DATA: Screening.

EXAM:
DIGITAL SCREENING BILATERAL MAMMOGRAM WITH TOMO AND CAD

[L CC tomo · tomo slice 30/59.0]
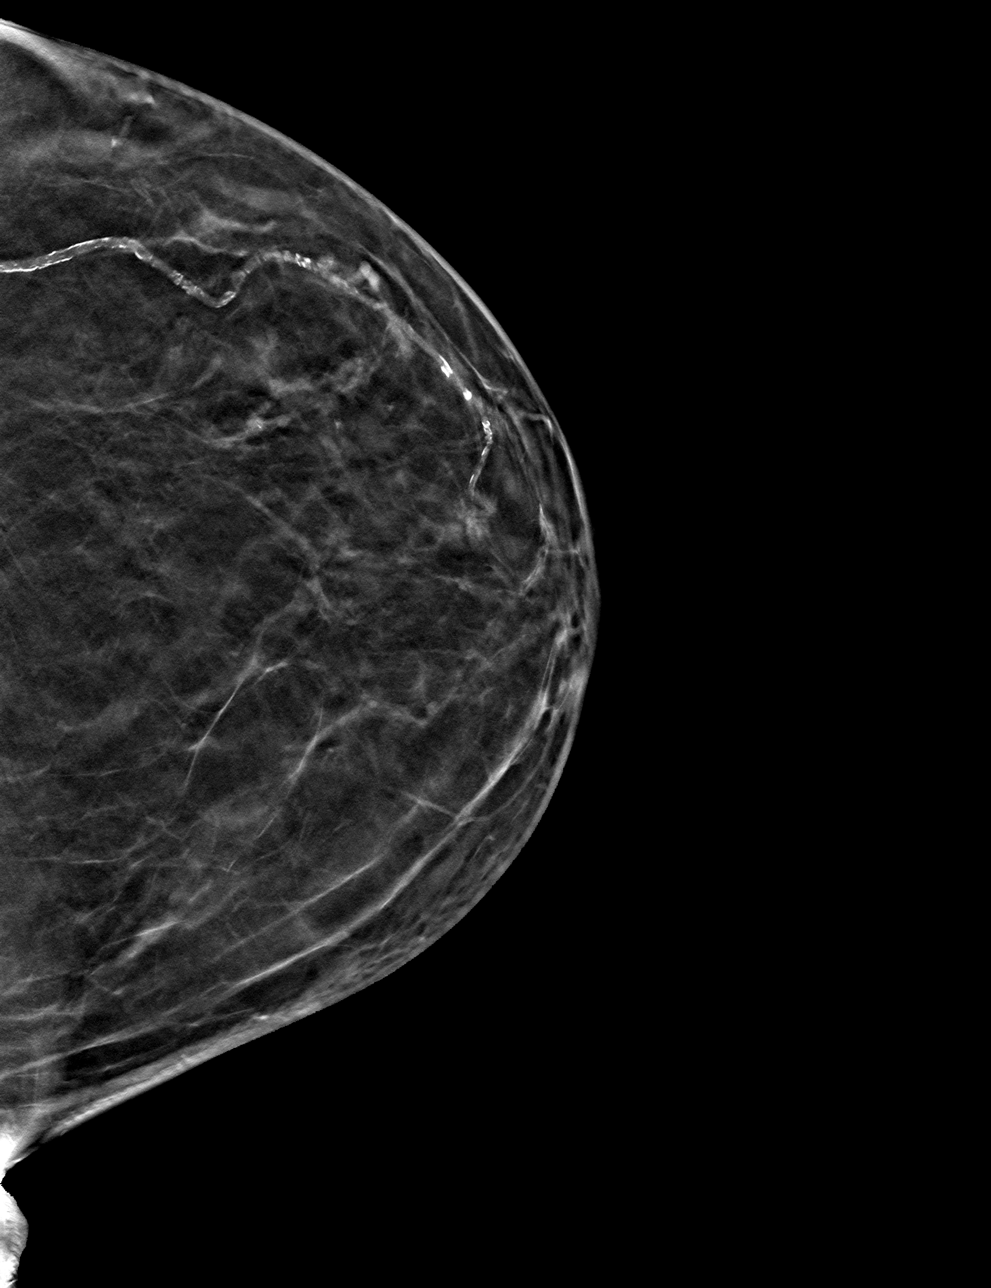

[R CC tomo · tomo slice 30/59.0]
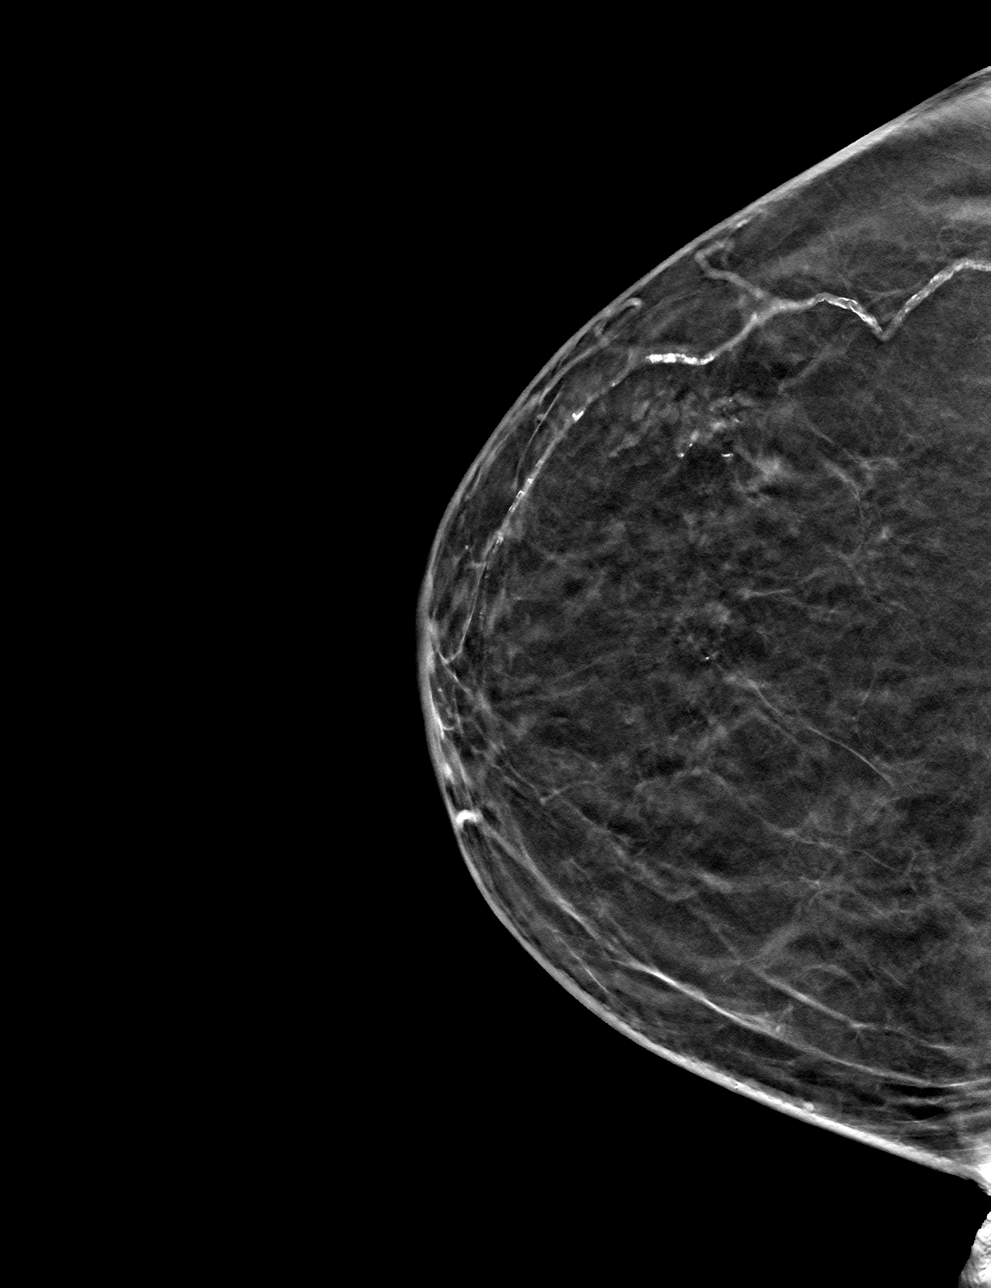

[2 of 10 positions shown; findings below may reference images not displayed]

ACR Breast Density Category b: There are scattered areas of
fibroglandular density.
FINDINGS: There are no findings suspicious for malignancy. Images were
processed with CAD.
IMPRESSION: No mammographic evidence of malignancy. A result letter of this
screening mammogram will be mailed directly to the patient.

RECOMMENDATION:
Screening mammogram in one year. (Code:[id number])

BI-RADS CATEGORY  1: Negative.

## 2019-12-27 NOTE — Assessment & Plan Note (Addendum)
referred by Inda Coke, PA, her PCP. On 10/27/19 she was diagnosed with an acute left lower extremity DVT. She was previously on Eliquis for afib but had not taken it in the 3-4 days prior, and was discharged on Lovenox injections BID. 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  No DVT of lower extremity  Current treatment: Eliquis 5 mg p.o. twice daily Plan: Obtain lupus anticoagulant testing. If she is positive for lupus anticoagulant then she will need lifelong anticoagulation. She may need to remain on anticoagulation anyway for her history of atrial fibrillation.

## 2019-12-28 ENCOUNTER — Other Ambulatory Visit: Payer: Medicare HMO

## 2019-12-28 ENCOUNTER — Encounter: Payer: Medicare HMO | Admitting: Hematology and Oncology

## 2019-12-28 ENCOUNTER — Inpatient Hospital Stay: Payer: Medicare HMO

## 2019-12-28 ENCOUNTER — Telehealth: Payer: Self-pay | Admitting: Hematology and Oncology

## 2019-12-28 ENCOUNTER — Other Ambulatory Visit: Payer: Self-pay

## 2019-12-28 DIAGNOSIS — I824Y2 Acute embolism and thrombosis of unspecified deep veins of left proximal lower extremity: Secondary | ICD-10-CM

## 2019-12-28 DIAGNOSIS — I82592 Chronic embolism and thrombosis of other specified deep vein of left lower extremity: Secondary | ICD-10-CM | POA: Diagnosis not present

## 2019-12-28 NOTE — Telephone Encounter (Signed)
No 10/12 los, no changes made to pt schedule   

## 2019-12-30 ENCOUNTER — Telehealth: Payer: Self-pay | Admitting: Cardiovascular Disease

## 2019-12-30 LAB — LUPUS ANTICOAGULANT PANEL
DRVVT: 49.4 s — ABNORMAL HIGH (ref 0.0–47.0)
PTT Lupus Anticoagulant: 30.4 s (ref 0.0–51.9)

## 2019-12-30 LAB — BETA-2-GLYCOPROTEIN I ABS, IGG/M/A
Beta-2 Glyco I IgG: <9 GPI IgG units (ref 0–20)
Beta-2-Glycoprotein I IgA: <9 GPI IgA units (ref 0–25)
Beta-2-Glycoprotein I IgM: <9 GPI IgM units (ref 0–32)

## 2019-12-30 LAB — DRVVT MIX: dRVVT Mix: 41.9 s — ABNORMAL HIGH (ref 0.0–40.4)

## 2019-12-30 LAB — CARDIOLIPIN ANTIBODIES, IGG, IGM, IGA
Anticardiolipin IgA: 48 APL U/mL — ABNORMAL HIGH (ref 0–11)
Anticardiolipin IgG: <9 GPL U/mL (ref 0–14)
Anticardiolipin IgM: 19 MPL U/mL — ABNORMAL HIGH (ref 0–12)

## 2019-12-30 LAB — DRVVT CONFIRM: dRVVT Confirm: 1.1 ratio (ref 0.8–1.2)

## 2019-12-30 MED ORDER — APIXABAN 5 MG PO TABS
5.0000 mg | ORAL_TABLET | Freq: Two times a day (BID) | ORAL | 1 refills | Status: DC
Start: 2019-12-30 — End: 2021-03-19

## 2019-12-30 NOTE — Telephone Encounter (Signed)
Prescription refill request for Eliquis received. Indication:  Atrial Fibrillation Last office visit: 11/2019 [place] Scr: 0.9 10/2019 Age: 86 Weight: 70 kg  Prescription refilled

## 2019-12-30 NOTE — Telephone Encounter (Signed)
*  STAT* If patient is at the pharmacy, call can be transferred to refill team.   1. Which medications need to be refilled? (please list name of each medication and dose if known) apixaban (ELIQUIS) 5 MG TABS tablet [[id number]]   2. Which pharmacy/location (including street and city if local pharmacy) is medication to be sent to? [id number] [address]  [place], [place] [address]  Phone:  [phone] Fax:  [phone]   3. Do they need a 30 day or 90 day supply? [place]

## 2020-01-30 ENCOUNTER — Telehealth: Payer: Self-pay

## 2020-01-30 NOTE — Telephone Encounter (Signed)
Merlin alert received 01/30/20 for AF burden 24%. Per previous note on 10/17/19 by Dr. Sallyanne Kuster, if patient asymptomatic and compliant with medications, no changes.  Called patient to assess and ask about medications.  No answer, LMOVM.

## 2020-01-31 DIAGNOSIS — R69 Illness, unspecified: Secondary | ICD-10-CM | POA: Diagnosis not present

## 2020-01-31 NOTE — Telephone Encounter (Signed)
LMOM to call DC, Phone # provide. If asymptomatic with AF  Then no changes in treatman plan.

## 2020-02-01 DIAGNOSIS — R69 Illness, unspecified: Secondary | ICD-10-CM | POA: Diagnosis not present

## 2020-02-01 NOTE — Telephone Encounter (Signed)
Spoke to Rosalia ([facility]), states patient has been feeling well. Reports of a little more winded which is normal for her when she is in AF. States she will call with any changes.

## 2020-02-06 ENCOUNTER — Ambulatory Visit (INDEPENDENT_AMBULATORY_CARE_PROVIDER_SITE_OTHER): Payer: Medicare HMO

## 2020-02-06 DIAGNOSIS — I428 Other cardiomyopathies: Secondary | ICD-10-CM | POA: Diagnosis not present

## 2020-02-07 LAB — CUP PACEART REMOTE DEVICE CHECK
Battery Remaining Longevity: 91 mo
Battery Remaining Percentage: 95.5 %
Battery Voltage: 2.98 V
Brady Statistic AP VP Percent: 6.8 %
Brady Statistic AP VS Percent: 1 %
Brady Statistic AS VP Percent: 90 %
Brady Statistic AS VS Percent: 1.5 %
Brady Statistic RA Percent Paced: 4.7 %
Date Time Interrogation Session: 20211122020015
Implantable Lead Implant Date: 20190219
Implantable Lead Implant Date: 20190219
Implantable Lead Implant Date: 20190219
Implantable Lead Location: 753858
Implantable Lead Location: 753859
Implantable Lead Location: 753860
Implantable Pulse Generator Implant Date: 20190219
Lead Channel Impedance Value: 460 Ohm
Lead Channel Impedance Value: 650 Ohm
Lead Channel Impedance Value: 960 Ohm
Lead Channel Pacing Threshold Amplitude: 0.5 V
Lead Channel Pacing Threshold Amplitude: 1 V
Lead Channel Pacing Threshold Amplitude: 1.75 V
Lead Channel Pacing Threshold Pulse Width: 0.5 ms
Lead Channel Pacing Threshold Pulse Width: 0.5 ms
Lead Channel Pacing Threshold Pulse Width: 0.8 ms
Lead Channel Sensing Intrinsic Amplitude: 2.1 mV
Lead Channel Sensing Intrinsic Amplitude: 8.8 mV
Lead Channel Setting Pacing Amplitude: 2 V
Lead Channel Setting Pacing Amplitude: 2.5 V
Lead Channel Setting Pacing Amplitude: 2.5 V
Lead Channel Setting Pacing Pulse Width: 0.5 ms
Lead Channel Setting Pacing Pulse Width: 0.8 ms
Lead Channel Setting Sensing Sensitivity: 4 mV
Pulse Gen Model: 3262
Pulse Gen Serial Number: 8995296

## 2020-02-08 NOTE — Progress Notes (Signed)
Remote pacemaker transmission.   

## 2020-02-27 ENCOUNTER — Ambulatory Visit (INDEPENDENT_AMBULATORY_CARE_PROVIDER_SITE_OTHER): Payer: Medicare HMO

## 2020-02-27 ENCOUNTER — Other Ambulatory Visit: Payer: Self-pay

## 2020-02-27 VITALS — BP 138/72 | HR 56 | Temp 97.2°F | Wt 156.2 lb

## 2020-02-27 DIAGNOSIS — Z0000 Encounter for general adult medical examination without abnormal findings: Secondary | ICD-10-CM | POA: Diagnosis not present

## 2020-02-27 NOTE — Progress Notes (Signed)
Subjective:   Madison White is a 86 y.o. female who presents for Medicare Annual (Subsequent) preventive examination.  Review of Systems     Cardiac Risk Factors include: advanced age (>86men, >61 women);hypertension     Objective:    Today's Vitals   02/27/20 1433  BP: 138/72  Pulse: (!) 56  Temp: (!) 97.2 F (36.2 C)  SpO2: 96%  Weight: 156 lb 3.2 oz (70.9 kg)   Body mass index is 29.51 kg/m.  Advanced Directives 02/27/2020 10/24/2019 08/09/2019 01/24/2019 11/21/2017 09/01/2017 05/05/2017  Does Patient Have a Medical Advance Directive? Yes No No Yes Yes No Yes  Type of Paramedic of [place];Living will - - [place];Living will - - Living will;Healthcare Power of Attorney  Does patient want to make changes to medical advance directive? - - - No - Patient declined - - No - Patient declined  Copy of [place] in Chart? No - copy requested - - No - copy requested - - No - copy requested  Would patient like information on creating a medical advance directive? - - Yes (MAU/Ambulatory/Procedural Areas - Information given) - - No - Patient declined -    Current Medications (verified) Outpatient Encounter Medications as of 02/27/2020  Medication Sig  . amLODipine (NORVASC) 5 MG tablet Take 1 tablet (5 mg total) by mouth daily.  [name] apixaban (ELIQUIS) 5 MG TABS tablet Take 1 tablet (5 mg total) by mouth 2 (two) times daily.  . calcium-vitamin D (OSCAL WITH D) 500-200 MG-UNIT tablet Take 1 tablet by mouth.  . cyanocobalamin 100 MCG tablet Take by mouth.  . Iron-Vitamin C 100-250 MG TABS Take by mouth.  . metoprolol tartrate (LOPRESSOR) 25 MG tablet Take 1 tablet (25 mg total) by mouth 2 (two) times daily.  [name] omeprazole (PRILOSEC) 20 MG capsule TAKE 1 CAPSULE BY MOUTH EVERY DAY  . PEDIATRIC MULTIVITAMINS-IRON PO Take by mouth.  . [DISCONTINUED] metoprolol tartrate (LOPRESSOR) 25 MG tablet TAKE 1 TABLET BY MOUTH TWICE A DAY  (Patient not taking: Reported on 02/27/2020)  . [DISCONTINUED] Multiple Vitamin (MULTIVITAMIN) tablet Take 1 tablet by mouth daily.    No facility-administered encounter medications on file as of 02/27/2020.    Allergies (verified) Penicillins, Nickel, Avapro [irbesartan], Lisinopril, Simvastatin, Latex, Sulfa antibiotics, and Sulfamethoxazole   History: Past Medical History:  Diagnosis Date  . Acute lower GI bleeding   . Diastolic dysfunction, left ventricle   . GERD (gastroesophageal reflux disease)   . 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  [name] History of blood transfusion   . History of rheumatic fever   . HTN (hypertension)    hx of refusing treatment--White coat HTN and/or situational HTN (?)   . Hyperlipidemia    hx of refusing treatment  . Insulin resistance    A1c's excellent per old records (6.1 in 2008 and 2009)  . Left bundle branch block 12/2011   Dr. Sallyanne Kuster at [facility]; ECHO and myocardial perfusion scan showed  septal and apical wall motion abnormality but she had no significant valvular disease and no ischemia.  She did have mildly decreased EF (39% on lexiscan and 50% on echo) and abnl LV relaxation.  Mild amount of PVCs.  Pt at higher  risk for other conduction abnormalities, good chance of eventually requiring a pacemaker.   [name] LVH (left ventricular hypertrophy)   . Nonischemic cardiomyopathy ([place]) 2016   LV dysfunction due to LBBB/septal dyssynchrony  . Osteoarthritis    bilat knees--needs bilat TKA.  Ortho is trying steroid injections as of 10/23/15.  . Presence of biventricular cardiac pacemaker 04/05/2019  . Presence of permanent cardiac pacemaker 05/05/2017  . Seasonal allergies   . Solitary pulmonary nodule 05/2016   8 mm pleural based  nodule in RLL.  Radiology recommended f/u noncontrast CT in 6-12 mo.   Past Surgical History:  Procedure Laterality Date  . ABDOMINAL HYSTERECTOMY  1963   At the time of her last C/S; says she had partial bladder resection at that time as well  . BI-VENTRICULAR PACEMAKER INSERTION (CRT-P)  05/05/2017  . BIV PACEMAKER INSERTION CRT-P N/A 05/05/2017   Procedure: BIV PACEMAKER INSERTION CRT-P;  Surgeon: Evans Lance, MD;  Location: [facility];  Service: Cardiovascular;  Laterality: N/A;  . CARDIOVASCULAR STRESS TEST  08/2005 & 12/2011   Low risk scans (on the 12/2011 scan she did have EF 39% with moderately severe LV dysfunction with septal dyssynergy probably contributed by LBBB  . CARDIOVERSION N/A 08/09/2019   Procedure: CARDIOVERSION;  Surgeon: Pixie Casino, MD;  Location: [facility] ENDOSCOPY;  Service: Cardiovascular;  Laterality: N/A;  . Carotid dopplers  11/22/14; 11/30/16   NORMAL 2016 and 2018  . CATARACT EXTRACTION  08/14/11   both  . CESAREAN SECTION  X 5   One of her neonates died soon after birth  . CHOLECYSTECTOMY     1980's  . COLONOSCOPY WITH PROPOFOL N/A 09/02/2017   Procedure: COLONOSCOPY WITH PROPOFOL;  Surgeon: Ladene Artist, MD;  Location[facility] ENDOSCOPY;  Service: Endoscopy;  Laterality: N/A;  . HOT HEMOSTASIS N/A 09/02/2017   Procedure: HOT HEMOSTASIS (ARGON PLASMA COAGULATION/BICAP);  Surgeon: Ladene Artist, MD;  Location: [facility] ENDOSCOPY;  Service: Endoscopy;  Laterality: N/A;  . INSERT / REPLACE / REMOVE PACEMAKER    . JOINT REPLACEMENT Bilateral    knee  . right knee surgery Right 05/2016   ? R TKA: no records.  [name] STRABISMUS SURGERY  1939  . TONSILLECTOMY  1938  . TRANSTHORACIC ECHOCARDIOGRAM  01/13/12   Septal dyssynergy, EF 50%, LV relaxation impaired.  No significant valvular abnormalities.   Family History  Problem Relation Age of Onset  . Hypertension Mother   . Heart disease Mother   . Hyperlipidemia Mother   . Diabetes Mother   . Heart disease  Father   . Cancer Father    Social History   Socioeconomic History  . Marital status: Widowed    Spouse name: Not on file  . Number of children: Not on file  . Years of education: Not on file  . Highest education level: Not on file  Occupational History  . Occupation: retired   Tobacco Use  . Smoking status: Former Smoker    Quit date: 05/31/1958    Years since quitting: 61.7  . Smokeless tobacco: Never Used  Vaping Use  . Vaping Use: Never used  Substance and Sexual Activity  . Alcohol use: Not Currently    Alcohol/week: 1.0 standard drink    Types: 1 Glasses of wine per week  . Drug use: No  . Sexual activity: Not on file  Other Topics Concern  . Not on file  Social History Narrative   Widower, husband died around 54 (wrongful death  per pt's report).   Originally from [place], has been in [place] since about 1997.   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   Distant hx of tobacco abuse.  No alcohol or drugs.   Social Determinants of Health   Financial Resource Strain: Low Risk   . Difficulty of Paying Living Expenses: Not hard at all  Food Insecurity: No Food Insecurity  . Worried About Charity fundraiser in the Last Year: Never true  . Ran Out of Food in the Last Year: Never true  Transportation Needs: No Transportation Needs  . Lack of Transportation (Medical): No  . Lack of Transportation (Non-Medical): No  Physical Activity: Inactive  . Days of Exercise per Week: 0 days  . Minutes of Exercise per Session: 0 min  Stress: No Stress Concern Present  . Feeling of Stress : Not at all  Social Connections: Moderately Isolated  . Frequency of Communication with Friends and Family: More than three times a week  . Frequency of Social Gatherings with Friends and Family: More than three times a week  . Attends Religious Services: More than 4 times per year  . Active Member of Clubs or Organizations: No  . Attends English as a second language teacher Meetings: Never  . Marital Status: Widowed    Tobacco Counseling Counseling given: Not Answered   Clinical Intake:  Pre-visit preparation completed: Yes  Pain : No/denies pain     BMI - recorded: 29.51 Nutritional Status: BMI 25 -29 Overweight Nutritional Risks: None Diabetes: No  How often do you need to have someone help you when you read instructions, pamphlets, or other written materials from your doctor or pharmacy?: 1 - Never  Diabetic?No  Interpreter Needed?: No  Information entered by :: Charlott Rakes ,LPN   Activities of Daily Living In your present state of health, do you have any difficulty performing the following activities: 02/27/2020 10/25/2019  Hearing? N N  Vision? N N  Difficulty concentrating or making decisions? Y Y  Comment at times memory -  Walking or climbing stairs? N Y  Dressing or bathing? N Y  Doing errands, shopping? N Y  Conservation officer, nature and eating ? Y -  Comment family assist -  Using the Toilet? N -  In the past six months, have you accidently leaked urine? N -  Do you have problems with loss of bowel control? N -  Managing your Medications? N -  Managing your Finances? Y -  Comment family assist -  Housekeeping or managing your Housekeeping? N -  Some recent data might be hidden    Patient Care Team: Leamon Arnt, MD as PCP - General (Family Medicine) Croitoru, Dani Gobble, MD as PCP - Cardiology (Cardiology) Evans Lance, MD as PCP - Electrophysiology (Cardiology) Croitoru, Dani Gobble, MD as Consulting Physician (Cardiology) Pieter Partridge, DO as Consulting Physician (Neurology) Ninetta Lights, MD (Inactive) as Consulting Physician (Orthopedic Surgery) Gaynelle Arabian, MD as Consulting Physician (Orthopedic Surgery) Crista Luria, MD as Consulting Physician (Dermatology) [place] (Optometry) Nolon Lennert (Dentistry) Lavonna Monarch, MD as Consulting Physician (Dermatology) Myrlene Broker, MD as Attending Physician  (Urology)  Indicate any recent Medical Services you may have received from other than [facility] providers in the past year (date may be approximate).     Assessment:   This is a routine wellness examination for Dan.  Hearing/Vision screen  Hearing Screening   125Hz  250Hz  500Hz  1000Hz  2000Hz  3000Hz  4000Hz  6000Hz   8000Hz   Right ear:           Left ear:           Comments: Pt hearing denies any issue   Vision Screening Comments: Pt was seeing Dr Sabra Heck before cataract surgery  Dietary issues and exercise activities discussed: Current Exercise Habits: The patient does not participate in regular exercise at present (walks around house and does own laundry)  Goals    .  <enter goal here> (pt-stated)      "get back in to motion after knee surgery" (in January). Anticipates getting back [employer] for water exercise.     .  Patient Stated      Maintain health      Depression Screen PHQ 2/9 Scores 02/27/2020 01/24/2019 12/08/2017 01/14/2017 01/07/2016  PHQ - 2 Score 0 0 0 0 0  PHQ- 9 Score - - 0 - -    Fall Risk Fall Risk  02/27/2020 10/25/2019 01/24/2019 03/23/2018 12/08/2017  Falls in the past year? 0 1 0 1 Yes  Number falls in past yr: 0 0 - 1 -  Injury with Fall? 0 1 0 1 -  Risk Factor Category  - - - - High Fall Risk  Risk for fall due to : - - Impaired balance/gait - -  Follow up Falls prevention discussed - Falls evaluation completed;Education provided;Falls prevention discussed - -    FALL RISK PREVENTION PERTAINING TO THE HOME:  Any stairs in or around the home? Yes  If so, are there any without handrails? No  Home free of loose throw rugs in walkways, pet beds, electrical cords, etc? Yes  Adequate lighting in your home to reduce risk of falls? Yes   ASSISTIVE DEVICES UTILIZED TO PREVENT FALLS:  Life alert? Yes  Use of a cane, walker or w/c? Yes   available if needed  Grab bars in the bathroom? Yes  Shower chair or bench in shower? Yes  Elevated toilet seat or a handicapped  toilet? No   TIMED UP AND GO:  Was the test performed? Yes .  Length of time to ambulate 10 feet: 10 sec.   Gait slow and steady without use of assistive device  Cognitive Function: MMSE - Mini Mental State Exam 01/24/2019 09/07/2017 01/14/2017 01/07/2016  Orientation to time 5 5 4 4   Orientation to Place 5 4 5 5   Registration 3 3 3 3   Attention/ Calculation 4 5 5 5   Recall 1 3 2 3   Language- name 2 objects 2 2 2 2   Language- repeat 1 1 1 1   Language- follow 3 step command 3 3 3 3   Language- read & follow direction 1 1 1 1   Write a sentence 1 1 1 1   Copy design 1 1 1 1   Total score 27 29 28 29      6CIT Screen 02/27/2020  What Year? 0 points  What month? 3 points  Count back from 20 0 points  Months in reverse 4 points  Repeat phrase 4 points    Immunizations Immunization History  Administered Date(s) Administered  . Fluad Quad(high Dose 65+) 12/17/2018  . Influenza, High Dose Seasonal PF 12/28/2014, 01/14/2017, 12/08/2017  . Influenza,inj,Quad PF,6+ Mos 02/20/2014  . Influenza,inj,Quad PF,6-35 Mos 11/19/2012  . Influenza-Unspecified 12/26/2015, 02/01/2020  . PFIZER SARS-COV-2 Vaccination 05/01/2019, 05/24/2019, 02/01/2020  . Pneumococcal Conjugate-13 12/28/2014  . Pneumococcal Polysaccharide-23 05/20/2006  . Tdap 09/08/2011    TDAP status: Up to date  Flu Vaccine status: Up to date  Done 02/01/20 Pneumococcal vaccine status: Up to date  Covid-19 vaccine status: Completed vaccines  Qualifies for Shingles Vaccine? Yes   Zostavax completed No   Shingrix Completed?: No.    Education has been provided regarding the importance of this vaccine. Patient has been advised to call insurance company to determine out of pocket expense if they have not yet received this vaccine. Advised may also receive vaccine at local pharmacy or Health Dept. Verbalized acceptance and understanding.  Screening Tests Health Maintenance  Topic Date Due  . TETANUS/TDAP  09/07/2021  .  INFLUENZA VACCINE  Completed  . COVID-19 Vaccine  Completed  . PNA vac Low Risk Adult  Completed  . DEXA SCAN  Discontinued    Health Maintenance  There are no preventive care reminders to display for this patient.  Colorectal cancer screening: No longer required.   Mammogram status: No longer required due to age.     Additional Screening:   Vision Screening: Recommended annual ophthalmology exams for early detection of glaucoma and other disorders of the eye. Is the patient up to date with their annual eye exam?  No  Who is the provider or what is the name of the office in which the patient attends annual eye exams? Dr Sabra Heck was folloing up   Dental Screening: Recommended annual dental exams for proper oral hygiene  Community Resource Referral / Chronic Care Management: CRR required this visit?  No   CCM required this visit?  No      Plan:     I have personally reviewed and noted the following in the patient's chart:   . Medical and social history . Use of alcohol, tobacco or illicit drugs  . Current medications and supplements . Functional ability and status . Nutritional status . Physical activity . Advanced directives . List of other physicians . Hospitalizations, surgeries, and ER visits in previous 12 months . Vitals . Screenings to include cognitive, depression, and falls . Referrals and appointments  In addition, I have reviewed and discussed with patient certain preventive protocols, quality metrics, and best practice recommendations. A written personalized care plan for preventive services as well as general preventive health recommendations were provided to patient.     Willette Brace, LPN   [id number]   Nurse Notes: None

## 2020-02-27 NOTE — Patient Instructions (Signed)
Madison White , Thank you for taking time to come for your Medicare Wellness Visit. I appreciate your ongoing commitment to your health goals. Please review the following plan we discussed and let me know if I can assist you in the future.   Screening recommendations/referrals: Colonoscopy: No longer required Mammogram: No longer required Bone Density: Declined Recommended yearly ophthalmology/optometry visit for glaucoma screening and checkup Recommended yearly dental visit for hygiene and checkup  Vaccinations: Influenza vaccine: Done 02/01/20 Pneumococcal vaccine: Up to date Tdap vaccine: Up to date Shingles vaccine: Shingrix discussed. Please contact your pharmacy for coverage information.    Covid-19:Completed 2/14, & 05/24/19, 02/01/20  Advanced directives: Please bring a copy of your health care power of attorney and living will to the office at your convenience.  Conditions/risks identified: Maintain health  Next appointment: Follow up in one year for your annual wellness visit    Preventive Care 65 Years and Older, Female Preventive care refers to lifestyle choices and visits with your health care provider that can promote health and wellness. What does preventive care include?  A yearly physical exam. This is also called an annual well check.  Dental exams once or twice a year.  Routine eye exams. Ask your health care provider how often you should have your eyes checked.  Personal lifestyle choices, including:  Daily care of your teeth and gums.  Regular physical activity.  Eating a healthy diet.  Avoiding tobacco and drug use.  Limiting alcohol use.  Practicing safe sex.  Taking low-dose aspirin every day.  Taking vitamin and mineral supplements as recommended by your health care provider. What happens during an annual well check? The services and screenings done by your health care provider during your annual well check will depend on your age, overall  health, lifestyle risk factors, and family history of disease. Counseling  Your health care provider may ask you questions about your:  Alcohol use.  Tobacco use.  Drug use.  Emotional well-being.  Home and relationship well-being.  Sexual activity.  Eating habits.  History of falls.  Memory and ability to understand (cognition).  Work and work Statistician.  Reproductive health. Screening  You may have the following tests or measurements:  Height, weight, and BMI.  Blood pressure.  Lipid and cholesterol levels. These may be checked every 5 years, or more frequently if you are over 56 years old.  Skin check.  Lung cancer screening. You may have this screening every year starting at age 71 if you have a 30-pack-year history of smoking and currently smoke or have quit within the past 15 years.  Fecal occult blood test (FOBT) of the stool. You may have this test every year starting at age 40.  Flexible sigmoidoscopy or colonoscopy. You may have a sigmoidoscopy every 5 years or a colonoscopy every 10 years starting at age 32.  Hepatitis C blood test.  Hepatitis B blood test.  Sexually transmitted disease (STD) testing.  Diabetes screening. This is done by checking your blood sugar (glucose) after you have not eaten for a while (fasting). You may have this done every 1-3 years.  Bone density scan. This is done to screen for osteoporosis. You may have this done starting at age 42.  Mammogram. This may be done every 1-2 years. Talk to your health care provider about how often you should have regular mammograms. Talk with your health care provider about your test results, treatment options, and if necessary, the need for more tests. Vaccines  Your health care provider may recommend certain vaccines, such as:  Influenza vaccine. This is recommended every year.  Tetanus, diphtheria, and acellular pertussis (Tdap, Td) vaccine. You may need a Td booster every 10  years.  Zoster vaccine. You may need this after age 65.  Pneumococcal 13-valent conjugate (PCV13) vaccine. One dose is recommended after age 14.  Pneumococcal polysaccharide (PPSV23) vaccine. One dose is recommended after age 45. Talk to your health care provider about which screenings and vaccines you need and how often you need them. This information is not intended to replace advice given to you by your health care provider. Make sure you discuss any questions you have with your health care provider. Document Released: 03/30/2015 Document Revised: 11/21/2015 Document Reviewed: 01/02/2015 Elsevier Interactive Patient Education  2017 [place] Prevention in the Home Falls can cause injuries. They can happen to people of all ages. There are many things you can do to make your home safe and to help prevent falls. What can I do on the outside of my home?  Regularly fix the edges of walkways and driveways and fix any cracks.  Remove anything that might make you trip as you walk through a door, such as a raised step or threshold.  Trim any bushes or trees on the path to your home.  Use bright outdoor lighting.  Clear any walking paths of anything that might make someone trip, such as rocks or tools.  Regularly check to see if handrails are loose or broken. Make sure that both sides of any steps have handrails.  Any raised decks and porches should have guardrails on the edges.  Have any leaves, snow, or ice cleared regularly.  Use sand or salt on walking paths during winter.  Clean up any spills in your garage right away. This includes oil or grease spills. What can I do in the bathroom?  Use night lights.  Install grab bars by the toilet and in the tub and shower. Do not use towel bars as grab bars.  Use non-skid mats or decals in the tub or shower.  If you need to sit down in the shower, use a plastic, non-slip stool.  Keep the floor dry. Clean up any water that  spills on the floor as soon as it happens.  Remove soap buildup in the tub or shower regularly.  Attach bath mats securely with double-sided non-slip rug tape.  Do not have throw rugs and other things on the floor that can make you trip. What can I do in the bedroom?  Use night lights.  Make sure that you have a light by your bed that is easy to reach.  Do not use any sheets or blankets that are too big for your bed. They should not hang down onto the floor.  Have a firm chair that has side arms. You can use this for support while you get dressed.  Do not have throw rugs and other things on the floor that can make you trip. What can I do in the kitchen?  Clean up any spills right away.  Avoid walking on wet floors.  Keep items that you use a lot in easy-to-reach places.  If you need to reach something above you, use a strong step stool that has a grab bar.  Keep electrical cords out of the way.  Do not use floor polish or wax that makes floors slippery. If you must use wax, use non-skid floor wax.  Do  not have throw rugs and other things on the floor that can make you trip. What can I do with my stairs?  Do not leave any items on the stairs.  Make sure that there are handrails on both sides of the stairs and use them. Fix handrails that are broken or loose. Make sure that handrails are as long as the stairways.  Check any carpeting to make sure that it is firmly attached to the stairs. Fix any carpet that is loose or worn.  Avoid having throw rugs at the top or bottom of the stairs. If you do have throw rugs, attach them to the floor with carpet tape.  Make sure that you have a light switch at the top of the stairs and the bottom of the stairs. If you do not have them, ask someone to add them for you. What else can I do to help prevent falls?  Wear shoes that:  Do not have high heels.  Have rubber bottoms.  Are comfortable and fit you well.  Are closed at the  toe. Do not wear sandals.  If you use a stepladder:  Make sure that it is fully opened. Do not climb a closed stepladder.  Make sure that both sides of the stepladder are locked into place.  Ask someone to hold it for you, if possible.  Clearly mark and make sure that you can see:  Any grab bars or handrails.  First and last steps.  Where the edge of each step is.  Use tools that help you move around (mobility aids) if they are needed. These include:  Canes.  Walkers.  Scooters.  Crutches.  Turn on the lights when you go into a dark area. Replace any light bulbs as soon as they burn out.  Set up your furniture so you have a clear path. Avoid moving your furniture around.  If any of your floors are uneven, fix them.  If there are any pets around you, be aware of where they are.  Review your medicines with your doctor. Some medicines can make you feel dizzy. This can increase your chance of falling. Ask your doctor what other things that you can do to help prevent falls. This information is not intended to replace advice given to you by your health care provider. Make sure you discuss any questions you have with your health care provider. Document Released: 12/28/2008 Document Revised: 08/09/2015 Document Reviewed: 04/07/2014 Elsevier Interactive Patient Education  2017 [employer].

## 2020-03-12 ENCOUNTER — Other Ambulatory Visit: Payer: Self-pay | Admitting: Family Medicine

## 2020-03-15 DIAGNOSIS — R69 Illness, unspecified: Secondary | ICD-10-CM | POA: Diagnosis not present

## 2020-04-04 ENCOUNTER — Other Ambulatory Visit: Payer: Self-pay | Admitting: Family Medicine

## 2020-04-12 DIAGNOSIS — N281 Cyst of kidney, acquired: Secondary | ICD-10-CM | POA: Diagnosis not present

## 2020-04-12 DIAGNOSIS — N200 Calculus of kidney: Secondary | ICD-10-CM | POA: Diagnosis not present

## 2020-04-12 DIAGNOSIS — N2889 Other specified disorders of kidney and ureter: Secondary | ICD-10-CM | POA: Diagnosis not present

## 2020-05-01 ENCOUNTER — Other Ambulatory Visit: Payer: Self-pay | Admitting: Family Medicine

## 2020-05-07 ENCOUNTER — Ambulatory Visit (INDEPENDENT_AMBULATORY_CARE_PROVIDER_SITE_OTHER): Payer: Medicare HMO

## 2020-05-07 ENCOUNTER — Telehealth: Payer: Self-pay

## 2020-05-07 DIAGNOSIS — I428 Other cardiomyopathies: Secondary | ICD-10-CM

## 2020-05-07 NOTE — Telephone Encounter (Signed)
I called and spoke with Cyril Mourning (DPR on file).  I offered appt tomorrow or on Thursday with Roderic Palau, NP.  Cyril Mourning will check with her mom and call me back about scheduling an appt.

## 2020-05-07 NOTE — Telephone Encounter (Signed)
Alert transmission for ongoing AF burden on set 05/02/20.  A burden 9.1%.  Histograms controlled.  Known PAF.  Meds: Eliquis. Lopressor  Spoke with pt granddaughter, Cyril Mourning (DPR on file).  She was not surprised about ongoing AF, states pt has been very tired, napping a lot and having episodes of SOB.  She confirmed compliance with meds as ordered   Pt previously managed in AF clinic, granddaughter indicated agreement with f/u through them.

## 2020-05-14 LAB — CUP PACEART REMOTE DEVICE CHECK
Battery Remaining Longevity: 91 mo
Battery Remaining Percentage: 95.5 %
Battery Voltage: 2.98 V
Brady Statistic AP VP Percent: 6.2 %
Brady Statistic AP VS Percent: 1 %
Brady Statistic AS VP Percent: 91 %
Brady Statistic AS VS Percent: 1.4 %
Brady Statistic RA Percent Paced: 4.3 %
Date Time Interrogation Session: 20220227020013
Implantable Lead Implant Date: 20190219
Implantable Lead Implant Date: 20190219
Implantable Lead Implant Date: 20190219
Implantable Lead Location: 753858
Implantable Lead Location: 753859
Implantable Lead Location: 753860
Implantable Pulse Generator Implant Date: 20190219
Lead Channel Impedance Value: 450 Ohm
Lead Channel Impedance Value: 690 Ohm
Lead Channel Impedance Value: 960 Ohm
Lead Channel Pacing Threshold Amplitude: 0.5 V
Lead Channel Pacing Threshold Amplitude: 1 V
Lead Channel Pacing Threshold Amplitude: 1.75 V
Lead Channel Pacing Threshold Pulse Width: 0.5 ms
Lead Channel Pacing Threshold Pulse Width: 0.5 ms
Lead Channel Pacing Threshold Pulse Width: 0.8 ms
Lead Channel Sensing Intrinsic Amplitude: 12 mV
Lead Channel Sensing Intrinsic Amplitude: 3 mV
Lead Channel Setting Pacing Amplitude: 2 V
Lead Channel Setting Pacing Amplitude: 2.5 V
Lead Channel Setting Pacing Amplitude: 2.5 V
Lead Channel Setting Pacing Pulse Width: 0.5 ms
Lead Channel Setting Pacing Pulse Width: 0.8 ms
Lead Channel Setting Sensing Sensitivity: 4 mV
Pulse Gen Model: 3262
Pulse Gen Serial Number: 8995296

## 2020-05-15 NOTE — Progress Notes (Signed)
Remote pacemaker transmission.   

## 2020-05-17 ENCOUNTER — Encounter: Payer: Medicare HMO | Admitting: Internal Medicine

## 2020-05-27 ENCOUNTER — Other Ambulatory Visit: Payer: Self-pay | Admitting: Family Medicine

## 2020-05-31 ENCOUNTER — Other Ambulatory Visit: Payer: Self-pay | Admitting: Family Medicine

## 2020-06-03 DIAGNOSIS — S42201A Unspecified fracture of upper end of right humerus, initial encounter for closed fracture: Secondary | ICD-10-CM | POA: Diagnosis not present

## 2020-06-03 DIAGNOSIS — S0990XA Unspecified injury of head, initial encounter: Secondary | ICD-10-CM | POA: Diagnosis not present

## 2020-06-03 DIAGNOSIS — M25562 Pain in left knee: Secondary | ICD-10-CM | POA: Diagnosis not present

## 2020-06-03 DIAGNOSIS — Z880 Allergy status to penicillin: Secondary | ICD-10-CM | POA: Diagnosis not present

## 2020-06-03 DIAGNOSIS — Y9301 Activity, walking, marching and hiking: Secondary | ICD-10-CM | POA: Diagnosis not present

## 2020-06-03 DIAGNOSIS — M25521 Pain in right elbow: Secondary | ICD-10-CM | POA: Diagnosis not present

## 2020-06-03 DIAGNOSIS — M25511 Pain in right shoulder: Secondary | ICD-10-CM | POA: Diagnosis not present

## 2020-06-03 DIAGNOSIS — S99921A Unspecified injury of right foot, initial encounter: Secondary | ICD-10-CM | POA: Diagnosis not present

## 2020-06-03 DIAGNOSIS — Z79899 Other long term (current) drug therapy: Secondary | ICD-10-CM | POA: Diagnosis not present

## 2020-06-03 DIAGNOSIS — W010XXA Fall on same level from slipping, tripping and stumbling without subsequent striking against object, initial encounter: Secondary | ICD-10-CM | POA: Diagnosis not present

## 2020-06-03 DIAGNOSIS — G8911 Acute pain due to trauma: Secondary | ICD-10-CM | POA: Diagnosis not present

## 2020-06-03 DIAGNOSIS — S42211A Unspecified displaced fracture of surgical neck of right humerus, initial encounter for closed fracture: Secondary | ICD-10-CM | POA: Diagnosis not present

## 2020-06-03 DIAGNOSIS — Z882 Allergy status to sulfonamides status: Secondary | ICD-10-CM | POA: Diagnosis not present

## 2020-06-03 DIAGNOSIS — Z96652 Presence of left artificial knee joint: Secondary | ICD-10-CM | POA: Diagnosis not present

## 2020-06-03 DIAGNOSIS — S42251A Displaced fracture of greater tuberosity of right humerus, initial encounter for closed fracture: Secondary | ICD-10-CM | POA: Diagnosis not present

## 2020-06-03 DIAGNOSIS — I4891 Unspecified atrial fibrillation: Secondary | ICD-10-CM | POA: Diagnosis not present

## 2020-06-04 ENCOUNTER — Telehealth: Payer: Self-pay

## 2020-06-04 NOTE — Telephone Encounter (Signed)
[place]lin alert for ongoing AF episode.   Pt has known history of PAF on Eliquis for [place], typically asymptomatic.  Noted pt in ED yesterday due to fall resulting in injury to shoulder.    Attempted tor each pt to discuss symptoms, med compliance and offer AF clinic f/u.    LVM for pt granddaughter, Cyril Mourning (DPR on file)

## 2020-06-05 DIAGNOSIS — S42291A Other displaced fracture of upper end of right humerus, initial encounter for closed fracture: Secondary | ICD-10-CM | POA: Diagnosis not present

## 2020-06-07 ENCOUNTER — Telehealth: Payer: Self-pay | Admitting: *Deleted

## 2020-06-07 NOTE — Telephone Encounter (Signed)
   Primary Cardiologist: Sanda Klein, MD  Chart reviewed as part of pre-operative protocol coverage. Patient was contacted 06/07/2020 in reference to pre-operative risk assessment for pending surgery as outlined below.  EMELIN DASCENZO was last seen on 11/16/2019 by Roby Lofts, PA-C.  Since that day, LIL LEPAGE has done well without any chest pain or shortness of breath.  I have discussed the case with DOD Dr. Audie Box, any additional work-up likely will not lower her perioperative risk.  Overall procedure is considered a fairly low risk.  When she previously held Eliquis for 4 days, she developed DVT.  Therefore we would recommend she hold Eliquis for 2 days prior to the procedure and restart as soon as possible after the procedure based on the surgeon's discretion.  Therefore, based on ACC/AHA guidelines, the patient would be at acceptable risk for the planned procedure without further cardiovascular testing.   The patient was advised that if she develops new symptoms prior to surgery to contact our office to arrange for a follow-up visit, and she verbalized understanding.  I will route this recommendation to the requesting party via Epic fax function and remove from pre-op pool. Please call with questions.  [place], [place] 06/07/2020, 5:11 PM

## 2020-06-07 NOTE — Telephone Encounter (Signed)
Patient with diagnosis of afib and hx DVT on Eliquis for anticoagulation.   -DVT 10/27/19 (Extensive DVT involving the entire lower extremity extending to the iliac vein); had not taken eliquis 3-4 days prior  Procedure: Right open reduction internal fixation proximal humerus Date of procedure: 06/11/20  CHA2DS2-VASc Score = 6  This indicates a 9.7% annual risk of stroke. The patient's score is based upon: CHF History: Yes HTN History: Yes Diabetes History: No Stroke History: No Vascular Disease History: Yes Age Score: 2 Gender Score: 1   CrCl 40 mL/min using adjusted body weight  Platelet count 380K  Will likely need 2-3 day Eliquis hold for ortho procedure. However, given patient's extensive DVT last fall when she was off Eliquis for 3-4 days, will defer to MD for input.

## 2020-06-07 NOTE — Telephone Encounter (Signed)
   [facility] Pre-operative Risk Assessment    HEARTCARE STAFF: - Please ensure there is not already an duplicate clearance open for this procedure. - Under Visit Info/Reason for Call, type in Other and utilize the format Clearance MM/DD/YY or Clearance TBD. Do not use dashes or single digits. - If request is for dental extraction, please clarify the # of teeth to be extracted.  Request for surgical clearance:  1. What type of surgery is being performed? RIGHT OPEN REDUCTION INTERNAL FIXATION   2. When is this surgery scheduled? 06/11/20   3. What type of clearance is required (medical clearance vs. Pharmacy clearance to hold med vs. Both)? BOTH  4. Are there any medications that need to be held prior to surgery and how long? ELIQUIS   5. Practice name and name of physician performing surgery? [facility]; DR. Gwyndolyn Saxon [place]   6. What is the office phone number? [phone]   7.   What is the office fax number? [phone]  8.   Anesthesia type (None, local, MAC, general) ? NOT LISTED   Julaine Hua 06/07/2020, 10:52 AM  _________________________________________________________________   (provider comments below)

## 2020-06-07 NOTE — Telephone Encounter (Signed)
Patient is a 87 year old female with past medical history of left bundle branch block, history of DVT while off of Eliquis, history of pacemaker due to complete heart block, and history of nonischemic cardiomyopathy which was felt to be related to LBBB.  Echocardiogram obtained in 2013 showed EF 50%.  Myoview in 2013 was low risk with EF 39%, no ischemia.  Patient was last seen by Roby Lofts PA-C in September 2021 at which time she was doing well.  According to family member, she recently had a mechanical fall at home on 3/20, x-ray obtained at [facility] showed acute fracture through the humeral neck and greater tuberosity.  Patient is pending open reduction and internal fixation of proximal humerus by Dr. Alvera Novel on 06/11/2020, which is next Monday.  Talking with patient's daughter who take care of her, she has had no recent chest pain or worsening dyspnea.  Again, majority of her work-up was done in 2013 or prior.  There has been no recent ischemic work-up or echocardiogram.  We will discuss her case with DOD today.  Currently there is no opening in the office to work her in.  Our clinical pharmacist is reviewing her Eliquis.

## 2020-06-07 NOTE — Telephone Encounter (Signed)
Would recommend holding preop anticoagulant for only 48-72 hours

## 2020-06-07 NOTE — Telephone Encounter (Signed)
Clinical pharmacist to review Eliquis.  Patient has a history of PAF and DVT.

## 2020-06-08 DIAGNOSIS — Z01812 Encounter for preprocedural laboratory examination: Secondary | ICD-10-CM | POA: Diagnosis not present

## 2020-06-08 DIAGNOSIS — Z20822 Contact with and (suspected) exposure to covid-19: Secondary | ICD-10-CM | POA: Diagnosis not present

## 2020-06-11 ENCOUNTER — Other Ambulatory Visit: Payer: Self-pay | Admitting: Internal Medicine

## 2020-06-11 DIAGNOSIS — Z882 Allergy status to sulfonamides status: Secondary | ICD-10-CM | POA: Diagnosis not present

## 2020-06-11 DIAGNOSIS — S42221A 2-part displaced fracture of surgical neck of right humerus, initial encounter for closed fracture: Secondary | ICD-10-CM | POA: Diagnosis not present

## 2020-06-11 DIAGNOSIS — Z792 Long term (current) use of antibiotics: Secondary | ICD-10-CM | POA: Diagnosis not present

## 2020-06-11 DIAGNOSIS — Z950 Presence of cardiac pacemaker: Secondary | ICD-10-CM | POA: Diagnosis not present

## 2020-06-11 DIAGNOSIS — I4819 Other persistent atrial fibrillation: Secondary | ICD-10-CM | POA: Diagnosis not present

## 2020-06-11 DIAGNOSIS — E669 Obesity, unspecified: Secondary | ICD-10-CM | POA: Diagnosis not present

## 2020-06-11 DIAGNOSIS — Z7901 Long term (current) use of anticoagulants: Secondary | ICD-10-CM | POA: Diagnosis not present

## 2020-06-11 DIAGNOSIS — Z91040 Latex allergy status: Secondary | ICD-10-CM | POA: Diagnosis not present

## 2020-06-11 DIAGNOSIS — Z86718 Personal history of other venous thrombosis and embolism: Secondary | ICD-10-CM | POA: Diagnosis not present

## 2020-06-11 DIAGNOSIS — I10 Essential (primary) hypertension: Secondary | ICD-10-CM | POA: Diagnosis not present

## 2020-06-11 DIAGNOSIS — G8918 Other acute postprocedural pain: Secondary | ICD-10-CM | POA: Diagnosis not present

## 2020-06-11 DIAGNOSIS — Z6832 Body mass index (BMI) 32.0-32.9, adult: Secondary | ICD-10-CM | POA: Diagnosis not present

## 2020-06-11 DIAGNOSIS — Z880 Allergy status to penicillin: Secondary | ICD-10-CM | POA: Diagnosis not present

## 2020-06-11 DIAGNOSIS — K219 Gastro-esophageal reflux disease without esophagitis: Secondary | ICD-10-CM | POA: Diagnosis not present

## 2020-06-11 DIAGNOSIS — I428 Other cardiomyopathies: Secondary | ICD-10-CM | POA: Diagnosis not present

## 2020-06-11 DIAGNOSIS — E785 Hyperlipidemia, unspecified: Secondary | ICD-10-CM | POA: Diagnosis not present

## 2020-06-11 DIAGNOSIS — S42291A Other displaced fracture of upper end of right humerus, initial encounter for closed fracture: Secondary | ICD-10-CM | POA: Diagnosis not present

## 2020-06-11 DIAGNOSIS — S42201A Unspecified fracture of upper end of right humerus, initial encounter for closed fracture: Secondary | ICD-10-CM | POA: Diagnosis not present

## 2020-06-11 DIAGNOSIS — W19XXXA Unspecified fall, initial encounter: Secondary | ICD-10-CM | POA: Diagnosis not present

## 2020-06-11 DIAGNOSIS — I447 Left bundle-branch block, unspecified: Secondary | ICD-10-CM | POA: Diagnosis not present

## 2020-06-15 DIAGNOSIS — W19XXXD Unspecified fall, subsequent encounter: Secondary | ICD-10-CM | POA: Diagnosis not present

## 2020-06-15 DIAGNOSIS — Z950 Presence of cardiac pacemaker: Secondary | ICD-10-CM | POA: Diagnosis not present

## 2020-06-15 DIAGNOSIS — Z86718 Personal history of other venous thrombosis and embolism: Secondary | ICD-10-CM | POA: Diagnosis not present

## 2020-06-15 DIAGNOSIS — I4891 Unspecified atrial fibrillation: Secondary | ICD-10-CM | POA: Diagnosis not present

## 2020-06-15 DIAGNOSIS — Z7901 Long term (current) use of anticoagulants: Secondary | ICD-10-CM | POA: Diagnosis not present

## 2020-06-15 DIAGNOSIS — S42211D Unspecified displaced fracture of surgical neck of right humerus, subsequent encounter for fracture with routine healing: Secondary | ICD-10-CM | POA: Diagnosis not present

## 2020-06-18 DIAGNOSIS — Z950 Presence of cardiac pacemaker: Secondary | ICD-10-CM | POA: Diagnosis not present

## 2020-06-18 DIAGNOSIS — Z86718 Personal history of other venous thrombosis and embolism: Secondary | ICD-10-CM | POA: Diagnosis not present

## 2020-06-18 DIAGNOSIS — I4891 Unspecified atrial fibrillation: Secondary | ICD-10-CM | POA: Diagnosis not present

## 2020-06-18 DIAGNOSIS — Z7901 Long term (current) use of anticoagulants: Secondary | ICD-10-CM | POA: Diagnosis not present

## 2020-06-18 DIAGNOSIS — S42211D Unspecified displaced fracture of surgical neck of right humerus, subsequent encounter for fracture with routine healing: Secondary | ICD-10-CM | POA: Diagnosis not present

## 2020-06-18 DIAGNOSIS — W19XXXD Unspecified fall, subsequent encounter: Secondary | ICD-10-CM | POA: Diagnosis not present

## 2020-06-18 NOTE — Telephone Encounter (Signed)
Repeat alert received, pt in AF as of 06/11/20.    Attempted to reach pt again, LVM for grandaughter [name] to call back.

## 2020-06-20 DIAGNOSIS — I4891 Unspecified atrial fibrillation: Secondary | ICD-10-CM | POA: Diagnosis not present

## 2020-06-20 DIAGNOSIS — Z950 Presence of cardiac pacemaker: Secondary | ICD-10-CM | POA: Diagnosis not present

## 2020-06-20 DIAGNOSIS — S42211D Unspecified displaced fracture of surgical neck of right humerus, subsequent encounter for fracture with routine healing: Secondary | ICD-10-CM | POA: Diagnosis not present

## 2020-06-20 DIAGNOSIS — Z86718 Personal history of other venous thrombosis and embolism: Secondary | ICD-10-CM | POA: Diagnosis not present

## 2020-06-20 DIAGNOSIS — W19XXXD Unspecified fall, subsequent encounter: Secondary | ICD-10-CM | POA: Diagnosis not present

## 2020-06-20 DIAGNOSIS — Z7901 Long term (current) use of anticoagulants: Secondary | ICD-10-CM | POA: Diagnosis not present

## 2020-06-22 DIAGNOSIS — I4891 Unspecified atrial fibrillation: Secondary | ICD-10-CM | POA: Diagnosis not present

## 2020-06-22 DIAGNOSIS — S42211D Unspecified displaced fracture of surgical neck of right humerus, subsequent encounter for fracture with routine healing: Secondary | ICD-10-CM | POA: Diagnosis not present

## 2020-06-22 DIAGNOSIS — Z950 Presence of cardiac pacemaker: Secondary | ICD-10-CM | POA: Diagnosis not present

## 2020-06-22 DIAGNOSIS — Z7901 Long term (current) use of anticoagulants: Secondary | ICD-10-CM | POA: Diagnosis not present

## 2020-06-22 DIAGNOSIS — W19XXXD Unspecified fall, subsequent encounter: Secondary | ICD-10-CM | POA: Diagnosis not present

## 2020-06-22 DIAGNOSIS — Z86718 Personal history of other venous thrombosis and embolism: Secondary | ICD-10-CM | POA: Diagnosis not present

## 2020-06-25 DIAGNOSIS — Z7901 Long term (current) use of anticoagulants: Secondary | ICD-10-CM | POA: Diagnosis not present

## 2020-06-25 DIAGNOSIS — Z950 Presence of cardiac pacemaker: Secondary | ICD-10-CM | POA: Diagnosis not present

## 2020-06-25 DIAGNOSIS — I4891 Unspecified atrial fibrillation: Secondary | ICD-10-CM | POA: Diagnosis not present

## 2020-06-25 DIAGNOSIS — S42211D Unspecified displaced fracture of surgical neck of right humerus, subsequent encounter for fracture with routine healing: Secondary | ICD-10-CM | POA: Diagnosis not present

## 2020-06-25 DIAGNOSIS — Z86718 Personal history of other venous thrombosis and embolism: Secondary | ICD-10-CM | POA: Diagnosis not present

## 2020-06-25 DIAGNOSIS — W19XXXD Unspecified fall, subsequent encounter: Secondary | ICD-10-CM | POA: Diagnosis not present

## 2020-06-26 DIAGNOSIS — Z4889 Encounter for other specified surgical aftercare: Secondary | ICD-10-CM | POA: Diagnosis not present

## 2020-06-27 DIAGNOSIS — I4891 Unspecified atrial fibrillation: Secondary | ICD-10-CM | POA: Diagnosis not present

## 2020-06-27 DIAGNOSIS — W19XXXD Unspecified fall, subsequent encounter: Secondary | ICD-10-CM | POA: Diagnosis not present

## 2020-06-27 DIAGNOSIS — Z86718 Personal history of other venous thrombosis and embolism: Secondary | ICD-10-CM | POA: Diagnosis not present

## 2020-06-27 DIAGNOSIS — S42211D Unspecified displaced fracture of surgical neck of right humerus, subsequent encounter for fracture with routine healing: Secondary | ICD-10-CM | POA: Diagnosis not present

## 2020-06-27 DIAGNOSIS — Z950 Presence of cardiac pacemaker: Secondary | ICD-10-CM | POA: Diagnosis not present

## 2020-06-27 DIAGNOSIS — Z7901 Long term (current) use of anticoagulants: Secondary | ICD-10-CM | POA: Diagnosis not present

## 2020-06-28 NOTE — Telephone Encounter (Signed)
Made contact with patient granddaughter (DPR) about increased A-fib burden at 21%. Patient non-symptomatic at this time and has had recent surgery. Patient has been placed back on Eliquis. I advised that we will continue to monitor and that if any issues or symptoms to call [facility] the [facility].

## 2020-06-29 ENCOUNTER — Telehealth: Payer: Self-pay | Admitting: Emergency Medicine

## 2020-07-02 DIAGNOSIS — S42211D Unspecified displaced fracture of surgical neck of right humerus, subsequent encounter for fracture with routine healing: Secondary | ICD-10-CM | POA: Diagnosis not present

## 2020-07-02 DIAGNOSIS — W19XXXD Unspecified fall, subsequent encounter: Secondary | ICD-10-CM | POA: Diagnosis not present

## 2020-07-02 DIAGNOSIS — Z950 Presence of cardiac pacemaker: Secondary | ICD-10-CM | POA: Diagnosis not present

## 2020-07-02 DIAGNOSIS — Z7901 Long term (current) use of anticoagulants: Secondary | ICD-10-CM | POA: Diagnosis not present

## 2020-07-02 DIAGNOSIS — Z86718 Personal history of other venous thrombosis and embolism: Secondary | ICD-10-CM | POA: Diagnosis not present

## 2020-07-02 DIAGNOSIS — I4891 Unspecified atrial fibrillation: Secondary | ICD-10-CM | POA: Diagnosis not present

## 2020-07-02 NOTE — Telephone Encounter (Signed)
error 

## 2020-07-05 DIAGNOSIS — Z950 Presence of cardiac pacemaker: Secondary | ICD-10-CM | POA: Diagnosis not present

## 2020-07-05 DIAGNOSIS — Z86718 Personal history of other venous thrombosis and embolism: Secondary | ICD-10-CM | POA: Diagnosis not present

## 2020-07-05 DIAGNOSIS — W19XXXD Unspecified fall, subsequent encounter: Secondary | ICD-10-CM | POA: Diagnosis not present

## 2020-07-05 DIAGNOSIS — Z7901 Long term (current) use of anticoagulants: Secondary | ICD-10-CM | POA: Diagnosis not present

## 2020-07-05 DIAGNOSIS — I4891 Unspecified atrial fibrillation: Secondary | ICD-10-CM | POA: Diagnosis not present

## 2020-07-05 DIAGNOSIS — S42211D Unspecified displaced fracture of surgical neck of right humerus, subsequent encounter for fracture with routine healing: Secondary | ICD-10-CM | POA: Diagnosis not present

## 2020-07-20 DIAGNOSIS — Z4889 Encounter for other specified surgical aftercare: Secondary | ICD-10-CM | POA: Diagnosis not present

## 2020-07-24 ENCOUNTER — Telehealth: Payer: Self-pay | Admitting: Family Medicine

## 2020-07-24 NOTE — Chronic Care Management (AMB) (Signed)
  Chronic Care Management   Outreach Note  07/24/2020 Name: Madison White MRN: [id number] DOB: [birth year]  Referred by: Leamon Arnt, MD Reason for referral : No chief complaint on file.   An unsuccessful telephone outreach was attempted today. The patient was referred to the pharmacist for assistance with care management and care coordination.   Follow Up Plan:   Lauretta Grill Upstream Scheduler

## 2020-07-27 ENCOUNTER — Other Ambulatory Visit: Payer: Self-pay

## 2020-07-27 ENCOUNTER — Ambulatory Visit (INDEPENDENT_AMBULATORY_CARE_PROVIDER_SITE_OTHER): Payer: Medicare HMO | Admitting: Student

## 2020-07-27 ENCOUNTER — Encounter: Payer: Self-pay | Admitting: Student

## 2020-07-27 VITALS — BP 120/70 | HR 60 | Ht 60.0 in | Wt 154.8 lb

## 2020-07-27 DIAGNOSIS — I442 Atrioventricular block, complete: Secondary | ICD-10-CM

## 2020-07-27 DIAGNOSIS — I4819 Other persistent atrial fibrillation: Secondary | ICD-10-CM | POA: Diagnosis not present

## 2020-07-27 DIAGNOSIS — I10 Essential (primary) hypertension: Secondary | ICD-10-CM | POA: Diagnosis not present

## 2020-07-27 DIAGNOSIS — I428 Other cardiomyopathies: Secondary | ICD-10-CM

## 2020-07-27 LAB — CUP PACEART INCLINIC DEVICE CHECK
Battery Remaining Longevity: 87 mo
Battery Voltage: 2.98 V
Brady Statistic RA Percent Paced: 4 %
Brady Statistic RV Percent Paced: 98 %
Date Time Interrogation Session: 20220513131707
Implantable Lead Implant Date: 20190219
Implantable Lead Implant Date: 20190219
Implantable Lead Implant Date: 20190219
Implantable Lead Location: 753858
Implantable Lead Location: 753859
Implantable Lead Location: 753860
Implantable Pulse Generator Implant Date: 20190219
Lead Channel Impedance Value: 487.5 Ohm
Lead Channel Impedance Value: 687.5 Ohm
Lead Channel Impedance Value: 937.5 Ohm
Lead Channel Pacing Threshold Amplitude: 0.5 V
Lead Channel Pacing Threshold Amplitude: 0.75 V
Lead Channel Pacing Threshold Amplitude: 2 V
Lead Channel Pacing Threshold Pulse Width: 0.5 ms
Lead Channel Pacing Threshold Pulse Width: 0.5 ms
Lead Channel Pacing Threshold Pulse Width: 1 ms
Lead Channel Sensing Intrinsic Amplitude: 3.6 mV
Lead Channel Setting Pacing Amplitude: 2 V
Lead Channel Setting Pacing Amplitude: 2.5 V
Lead Channel Setting Pacing Amplitude: 2.5 V
Lead Channel Setting Pacing Pulse Width: 0.5 ms
Lead Channel Setting Pacing Pulse Width: 1 ms
Lead Channel Setting Sensing Sensitivity: 4 mV
Pulse Gen Model: 3262
Pulse Gen Serial Number: 8995296

## 2020-07-27 NOTE — Progress Notes (Signed)
Electrophysiology Office Note Date: 07/27/2020  ID:  Madison White, DOB 1928/10/10, MRN [id number]  PCP: Leamon Arnt, MD Primary Cardiologist: Sanda Klein, MD Electrophysiologist: Cristopher Peru, MD   CC: Pacemaker follow-up  Madison White is a 87 y.o. female seen today for Cristopher Peru, MD for routine electrophysiology followup.  Since last being seen in our clinic the patient reports doing overall well. She has occasional bouts of AF that she has overall tolerated well.  she denies chest pain, dyspnea, PND, orthopnea, nausea, vomiting, dizziness, syncope, edema, weight gain, or early satiety.  Device History: SJM CRT-P, implanted 05/05/17  Past Medical History:  Diagnosis Date  . Acute lower GI bleeding   . Diastolic dysfunction, left ventricle   . GERD (gastroesophageal reflux disease)   . 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  [name] History of blood transfusion   . History of rheumatic fever   . HTN (hypertension)    hx of refusing treatment--White coat HTN and/or situational HTN (?)   . Hyperlipidemia    hx of refusing treatment  . Insulin resistance    A1c's excellent per old records (6.1 in 2008 and 2009)  . Left bundle branch block 12/2011   Dr. Sallyanne Kuster at [facility]; ECHO and myocardial perfusion scan showed  septal and apical wall motion abnormality but she had no significant valvular disease and no ischemia.  She did have mildly decreased EF (39% on lexiscan and 50% on echo) and abnl LV relaxation.  Mild amount of PVCs.  Pt at higher risk for other conduction abnormalities, good chance of eventually requiring a pacemaker.   [name] LVH (left ventricular hypertrophy)   . Nonischemic cardiomyopathy ([place]) 2016   LV dysfunction due to LBBB/septal  dyssynchrony  . Osteoarthritis    bilat knees--needs bilat TKA.  Ortho is trying steroid injections as of 10/23/15.  . Presence of biventricular cardiac pacemaker 04/05/2019  . Presence of permanent cardiac pacemaker 05/05/2017  . Seasonal allergies   . Solitary pulmonary nodule 05/2016   8 mm pleural based nodule in RLL.  Radiology recommended f/u noncontrast CT in 6-12 mo.   Past Surgical History:  Procedure Laterality Date  . ABDOMINAL HYSTERECTOMY  1963   At the time of her last C/S; says she had partial bladder resection at that time as well  . BI-VENTRICULAR PACEMAKER INSERTION (CRT-P)  05/05/2017  . BIV PACEMAKER INSERTION CRT-P N/A 05/05/2017   Procedure: BIV PACEMAKER INSERTION CRT-P;  Surgeon: Evans Lance, MD;  Location: [facility];  Service: Cardiovascular;  Laterality: N/A;  . CARDIOVASCULAR STRESS TEST  08/2005 & 12/2011   Low risk scans (on the 12/2011 scan she did have EF 39% with moderately severe LV dysfunction with septal dyssynergy probably contributed by LBBB  . CARDIOVERSION N/A 08/09/2019   Procedure: CARDIOVERSION;  Surgeon: Pixie Casino, MD;  Location: [facility] ENDOSCOPY;  Service: Cardiovascular;  Laterality: N/A;  . Carotid dopplers  11/22/14; 11/30/16   NORMAL 2016 and 2018  . CATARACT EXTRACTION  08/14/11   both  . CESAREAN SECTION  X 5   One of her neonates died soon after birth  . CHOLECYSTECTOMY     1980's  . COLONOSCOPY WITH PROPOFOL N/A 09/02/2017   Procedure: COLONOSCOPY WITH PROPOFOL;  Surgeon: Ladene Artist, MD;  Location: [facility] ENDOSCOPY;  Service: Endoscopy;  Laterality: N/A;  . HOT HEMOSTASIS N/A 09/02/2017   Procedure: HOT HEMOSTASIS (ARGON PLASMA COAGULATION/BICAP);  Surgeon: Ladene Artist, MD;  Location: [facility] ENDOSCOPY;  Service: Endoscopy;  Laterality: N/A;  . INSERT / REPLACE / REMOVE PACEMAKER    . JOINT REPLACEMENT Bilateral    knee  . right knee surgery Right 05/2016   ? R TKA: no records.  [name] STRABISMUS SURGERY  1939  . TONSILLECTOMY   1938  . TRANSTHORACIC ECHOCARDIOGRAM  01/13/12   Septal dyssynergy, EF 50%, LV relaxation impaired.  No significant valvular abnormalities.    Current Outpatient Medications  Medication Sig Dispense Refill  . acetaminophen (TYLENOL) 500 MG tablet Take by mouth.    [name] amLODipine (NORVASC) 5 MG tablet TAKE 1 TABLET BY MOUTH EVERY DAY 90 tablet 3  . apixaban (ELIQUIS) 5 MG TABS tablet Take 1 tablet (5 mg total) by mouth 2 (two) times daily. 180 tablet 1  . calcium-vitamin D (OSCAL WITH D) 500-200 MG-UNIT tablet Take 1 tablet by mouth.    . Cholecalciferol 25 MCG (1000 UT) tablet Take by mouth.    . cyanocobalamin 100 MCG tablet Take by mouth.    . Iron-Vitamin C 100-250 MG TABS Take by mouth.    . metoprolol tartrate (LOPRESSOR) 25 MG tablet Take 1 tablet (25 mg total) by mouth 2 (two) times daily. 180 tablet 3  . omeprazole (PRILOSEC) 20 MG capsule TAKE 1 CAPSULE (20 MG TOTAL) BY MOUTH DAILY. PLEASE SCHEDULE FOLLOW UP APPT WITH DR. Jonni Sanger FOR FURTHER REFILLS 30 capsule 0  . PEDIATRIC MULTIVITAMINS-IRON PO Take by mouth.     No current facility-administered medications for this visit.    Allergies:   Penicillins, Nickel, Avapro [irbesartan], Lisinopril, Simvastatin, Latex, Sulfa antibiotics, and Sulfamethoxazole   Social History: Social History   Socioeconomic History  . Marital status: Widowed    Spouse name: Not on file  . Number of children: Not on file  . Years of education: Not on file  . Highest education level: Not on file  Occupational History  . Occupation: retired   Tobacco Use  . Smoking status: Former Smoker    Quit date: 05/31/1958    Years since quitting: 62.2  . Smokeless tobacco: Never Used  Vaping Use  . Vaping Use: Never used  Substance and Sexual Activity  . Alcohol use: Not Currently    Alcohol/week: 1.0 standard drink    Types: 1 Glasses of wine per week  . Drug use: No  . Sexual activity: Not on file  Other Topics Concern  . Not on file  Social History  Narrative   Widower, husband died around 68 (wrongful death per pt's report).   Originally from [place], has been in [place] since about 1997.   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   Distant hx of tobacco abuse.  No alcohol or drugs.   Social Determinants of Health   Financial Resource Strain: Low Risk   . Difficulty of Paying Living Expenses: Not hard at all  Food Insecurity: No Food Insecurity  . Worried About Charity fundraiser in the Last Year: Never true  . Ran Out of Food in the Last Year: Never true  Transportation Needs: No Transportation Needs  . Lack of Transportation (Medical): No  . Lack of Transportation (Non-Medical): No  Physical Activity: Inactive  . Days of Exercise  per Week: 0 days  . Minutes of Exercise per Session: 0 min  Stress: No Stress Concern Present  . Feeling of Stress : Not at all  Social Connections: Moderately Isolated  . Frequency of Communication with Friends and Family: More than three times a week  . Frequency of Social Gatherings with Friends and Family: More than three times a week  . Attends Religious Services: More than 4 times per year  . Active Member of Clubs or Organizations: No  . Attends Archivist Meetings: Never  . Marital Status: Widowed  Intimate Partner Violence: Not At Risk  . Fear of Current or Ex-Partner: No  . Emotionally Abused: No  . Physically Abused: No  . Sexually Abused: No    Family History: Family History  Problem Relation Age of Onset  . Hypertension Mother   . Heart disease Mother   . Hyperlipidemia Mother   . Diabetes Mother   . Heart disease Father   . Cancer Father      Review of Systems: All other systems reviewed and are otherwise negative except as noted above.  Physical Exam: Vitals:   07/27/20 1138  BP: 120/70  Pulse: 60  SpO2: 94%  Weight: 154 lb 12.8 oz (70.2 kg)  Height: 5' (1.524 m)     GEN- The patient is well  appearing, alert and oriented x 3 today.   HEENT: normocephalic, atraumatic; sclera clear, conjunctiva pink; hearing intact; oropharynx clear; neck supple  Lungs- Clear to ausculation bilaterally, normal work of breathing.  No wheezes, rales, rhonchi Heart- Regular rate and rhythm, no murmurs, rubs or gallops  GI- soft, non-tender, non-distended, bowel sounds present  Extremities- no clubbing or cyanosis. No edema MS- no significant deformity or atrophy Skin- warm and dry, no rash or lesion; PPM pocket well healed Psych- euthymic mood, full affect Neuro- strength and sensation are intact  PPM Interrogation- reviewed in detail today,  See PACEART report  EKG:  EKG is not ordered today.  Recent Labs: 10/25/2019: Brain Natriuretic Peptide 216; TSH 3.48 10/31/2019: ALT 9; BUN 12; Creatinine 0.9; Hemoglobin 10.0; Platelets 380; Potassium 3.9; Sodium 139   Wt Readings from Last 3 Encounters:  07/27/20 154 lb 12.8 oz (70.2 kg)  02/27/20 156 lb 3.2 oz (70.9 kg)  12/27/19 154 lb 4.8 oz (70 kg)     Other studies Reviewed: Additional studies/ records that were reviewed today include: Previous EP office notes, Previous remote checks, Most recent labwork.   Assessment and Plan:  1. CHB s/p St Jude CRT P Normal device function today AT/AF alerts turned off per MD request.  PPM site stable  2. Hx of LV dysfunction, LBBB     Improved LVEF by last echo, 2013. No current indication for repeat  3.  HTN No changes to current regimen.  4. PAFib/flutter AT/AF alerts turned off today per Dr. Victorino December request with stable PAF and no desire for [facility].  She is in NSR today, and the majority of the time, but tolerates AF well. AMS >30% Continue Eliquis.   Current medicines are reviewed at length with the patient today.   The patient does not have concerns regarding her medicines.  The following changes were made today:  none  Labs/ tests ordered today include:  No orders of the defined types  were placed in this encounter.  Disposition:   Follow up with Dr. Lovena Le in 12 months. Dr. Sallyanne Kuster in 6.     Signed, Shirley Friar, PA-C  07/27/2020 1:44 PM  [address] [address] [phone] (office) [phone] (fax)

## 2020-07-27 NOTE — Patient Instructions (Signed)
Medication Instructions:  Your physician recommends that you continue on your current medications as directed. Please refer to the Current Medication list given to you today.  *If you need a refill on your cardiac medications before your next appointment, please call your pharmacy*   Lab Work: None If you have labs (blood work) drawn today and your tests are completely normal, you will receive your results only by: [name] MyChart Message (if you have MyChart) OR . A paper copy in the mail If you have any lab test that is abnormal or we need to change your treatment, we will call you to review the results.  Follow-Up: At [facility], you and your health needs are our priority.  As part of our continuing mission to provide you with exceptional heart care, we have created designated Provider Care Teams.  These Care Teams include your primary Cardiologist (physician) and Advanced Practice Providers (APPs -  Physician Assistants and Nurse Practitioners) who all work together to provide you with the care you need, when you need it.  Your next appointment:   6 month(s)  The format for your next appointment:   In Person  Provider:   You may see Sanda Klein, MD or one of the following Advanced Practice Providers on your designated Care Team:    Almyra Deforest, PA-C  Fabian Sharp, PA-C or   Roby Lofts, [place]

## 2020-08-06 ENCOUNTER — Ambulatory Visit (INDEPENDENT_AMBULATORY_CARE_PROVIDER_SITE_OTHER): Payer: Medicare HMO

## 2020-08-06 DIAGNOSIS — I442 Atrioventricular block, complete: Secondary | ICD-10-CM

## 2020-08-07 LAB — CUP PACEART REMOTE DEVICE CHECK
Battery Remaining Longevity: 88 mo
Battery Remaining Percentage: 95.5 %
Battery Voltage: 2.98 V
Brady Statistic AP VP Percent: 2.8 %
Brady Statistic AP VS Percent: 1 %
Brady Statistic AS VP Percent: 95 %
Brady Statistic AS VS Percent: 1.7 %
Brady Statistic RA Percent Paced: 2.2 %
Date Time Interrogation Session: 20220523020013
Implantable Lead Implant Date: 20190219
Implantable Lead Implant Date: 20190219
Implantable Lead Implant Date: 20190219
Implantable Lead Location: 753858
Implantable Lead Location: 753859
Implantable Lead Location: 753860
Implantable Pulse Generator Implant Date: 20190219
Lead Channel Impedance Value: 510 Ohm
Lead Channel Impedance Value: 640 Ohm
Lead Channel Impedance Value: 900 Ohm
Lead Channel Pacing Threshold Amplitude: 0.5 V
Lead Channel Pacing Threshold Amplitude: 0.75 V
Lead Channel Pacing Threshold Amplitude: 2 V
Lead Channel Pacing Threshold Pulse Width: 0.5 ms
Lead Channel Pacing Threshold Pulse Width: 0.5 ms
Lead Channel Pacing Threshold Pulse Width: 1 ms
Lead Channel Sensing Intrinsic Amplitude: 1.7 mV
Lead Channel Sensing Intrinsic Amplitude: 12 mV
Lead Channel Setting Pacing Amplitude: 2 V
Lead Channel Setting Pacing Amplitude: 2.5 V
Lead Channel Setting Pacing Amplitude: 2.5 V
Lead Channel Setting Pacing Pulse Width: 0.5 ms
Lead Channel Setting Pacing Pulse Width: 1 ms
Lead Channel Setting Sensing Sensitivity: 4 mV
Pulse Gen Model: 3262
Pulse Gen Serial Number: 8995296

## 2020-08-24 ENCOUNTER — Telehealth: Payer: Self-pay | Admitting: Family Medicine

## 2020-08-24 NOTE — Progress Notes (Signed)
  Chronic Care Management   Outreach Note  08/24/2020 Name: Madison White MRN: [id number] DOB: [birth year]  Referred by: Leamon Arnt, MD Reason for referral : No chief complaint on file.   A second unsuccessful telephone outreach was attempted today. The patient was referred to pharmacist for assistance with care management and care coordination.  Follow Up Plan:   Lauretta Grill Upstream Scheduler

## 2020-08-28 NOTE — Progress Notes (Signed)
Remote pacemaker transmission.   

## 2020-08-30 DIAGNOSIS — Z4889 Encounter for other specified surgical aftercare: Secondary | ICD-10-CM | POA: Diagnosis not present

## 2020-09-07 ENCOUNTER — Ambulatory Visit (INDEPENDENT_AMBULATORY_CARE_PROVIDER_SITE_OTHER): Payer: Medicare HMO | Admitting: Family Medicine

## 2020-09-07 ENCOUNTER — Other Ambulatory Visit: Payer: Self-pay

## 2020-09-07 ENCOUNTER — Encounter: Payer: Self-pay | Admitting: Family Medicine

## 2020-09-07 VITALS — BP 147/85 | HR 70 | Temp 97.6°F | Ht 60.0 in | Wt 156.6 lb

## 2020-09-07 DIAGNOSIS — Z8719 Personal history of other diseases of the digestive system: Secondary | ICD-10-CM

## 2020-09-07 DIAGNOSIS — K921 Melena: Secondary | ICD-10-CM

## 2020-09-07 DIAGNOSIS — R519 Headache, unspecified: Secondary | ICD-10-CM

## 2020-09-07 DIAGNOSIS — I10 Essential (primary) hypertension: Secondary | ICD-10-CM

## 2020-09-07 DIAGNOSIS — Z7901 Long term (current) use of anticoagulants: Secondary | ICD-10-CM | POA: Diagnosis not present

## 2020-09-07 DIAGNOSIS — I4819 Other persistent atrial fibrillation: Secondary | ICD-10-CM | POA: Diagnosis not present

## 2020-09-07 LAB — LIPID PANEL
Cholesterol: 205 mg/dL — ABNORMAL HIGH (ref 0–200)
HDL: 62.6 mg/dL (ref >39.00)
NonHDL: 142.3
Total CHOL/HDL Ratio: 3
Triglycerides: 205 mg/dL — ABNORMAL HIGH (ref 0.0–149.0)
VLDL: 41 mg/dL — ABNORMAL HIGH (ref 0.0–40.0)

## 2020-09-07 LAB — COMPREHENSIVE METABOLIC PANEL
ALT: 14 U/L (ref 0–35)
AST: 21 U/L (ref 0–37)
Albumin: 4.2 g/dL (ref 3.5–5.2)
Alkaline Phosphatase: 56 U/L (ref 39–117)
BUN: 12 mg/dL (ref 6–23)
CO2: 28 mEq/L (ref 19–32)
Calcium: 9.4 mg/dL (ref 8.4–10.5)
Chloride: 102 mEq/L (ref 96–112)
Creatinine, Ser: 0.78 mg/dL (ref 0.40–1.20)
GFR: 67.19 mL/min (ref >60.00)
Glucose, Bld: 119 mg/dL — ABNORMAL HIGH (ref 70–99)
Potassium: 3.6 mEq/L (ref 3.5–5.1)
Sodium: 141 mEq/L (ref 135–145)
Total Bilirubin: 0.4 mg/dL (ref 0.2–1.2)
Total Protein: 7.5 g/dL (ref 6.0–8.3)

## 2020-09-07 LAB — CBC WITH DIFFERENTIAL/PLATELET
Basophils Absolute: 0.1 10*3/uL (ref 0.0–0.1)
Basophils Relative: 0.8 % (ref 0.0–3.0)
Eosinophils Absolute: 0.2 10*3/uL (ref 0.0–0.7)
Eosinophils Relative: 2.2 % (ref 0.0–5.0)
HCT: 45.2 % (ref 36.0–46.0)
Hemoglobin: 15.2 g/dL — ABNORMAL HIGH (ref 12.0–15.0)
Lymphocytes Relative: 26 % (ref 12.0–46.0)
Lymphs Abs: 2.7 10*3/uL (ref 0.7–4.0)
MCHC: 33.5 g/dL (ref 30.0–36.0)
MCV: 88.5 fl (ref 78.0–100.0)
Monocytes Absolute: 0.7 10*3/uL (ref 0.1–1.0)
Monocytes Relative: 6.8 % (ref 3.0–12.0)
Neutro Abs: 6.7 10*3/uL (ref 1.4–7.7)
Neutrophils Relative %: 64.2 % (ref 43.0–77.0)
Platelets: 308 10*3/uL (ref 150.0–400.0)
RBC: 5.11 Mil/uL (ref 3.87–5.11)
RDW: 14 % (ref 11.5–15.5)
WBC: 10.4 10*3/uL (ref 4.0–10.5)

## 2020-09-07 LAB — LDL CHOLESTEROL, DIRECT: Direct LDL: 127 mg/dL

## 2020-09-07 LAB — TSH: TSH: 2.55 u[IU]/mL (ref 0.35–4.50)

## 2020-09-07 LAB — SEDIMENTATION RATE: Sed Rate: 33 mm/hr — ABNORMAL HIGH (ref 0–30)

## 2020-09-07 NOTE — Progress Notes (Signed)
Subjective  CC:  Chief Complaint  Patient presents with   Headache    Ongoing for months, worsening    HPI: Madison White is a 87 y.o. female who presents to the office today to address the problems listed above in the chief complaint. 87 year old independent female with multiple medical problems presents with her daughter due to ongoing headaches.  Apparently, she has suffered from headaches since she took a bad fall about 2 years ago.  However over the last couple of weeks, her headache has become constant.  She describes discomfort in the right frontal area.  Headaches do not move, radiate and are not associated with photophobia, phonophobia, nausea or neurologic deficits.  They are not intense.  Tylenol does help the headache but it comes back.  She has been using more frequent Tylenol recently because of the headaches.  She also has been treating some of her back pain with chronic NSAIDs.  She has no history of migraines.  She denies pain with chewing although her daughter says that she will hold her head at times when she is eating.  She denies TMJ symptoms, ear pain, or neck pain.  She has been sleeping well.  She does not awaken from the headache or wake with a headache.  She has had no fevers or neck stiffness.  In 2019 when she had her fall, I reviewed ER records.  She did have a brain CT that showed cerebral atrophy with enlarged ventricles, an old infarct, but no acute findings.  Further review reveals that she has been outside in the heat more often over the last few weeks.  Possibly drinking less. At the end of the visit she also reports that about 2 weeks ago she had black stool.  Since, her stools have normalized.  She is on chronic anticoagulation for persistent atrial fibrillation.  I reviewed recent cardiology notes.  She denies chest pain, persistent palpitations, abdominal pain, GERD symptoms or fatigue. Hypertension: Treated with amlodipine 5 mg daily and metoprolol 25 twice  daily.  She reports good control.  Assessment  1. Frontal headache   2. History of GI bleed   3. Chronic anticoagulation   4. Persistent atrial fibrillation ([place])   5. HTN (hypertension), benign   6. Black stool      Plan  Frontal headaches: Chronic in nature since a fall in 2019.  Worsening over the last few weeks.  Recent worsening could be related to mild dehydration.  Also could have a component of rebound headaches.  Recommend stopping daily medications, hydration and avoiding overheating.  We will recheck if not improving.  No red flag symptoms today.  Did discuss red flag symptoms in detail.  Check sed rate given pain with eating at times.  No tenderness over temporal artery Chronic anticoagulation with history of GI bleed with reports of black stool weeks ago.  Check hemoglobin. Hypertension is controlled. Discussed need for biannual visits and complete physicals.  Follow up: 2 to 4 weeks to recheck headaches and complete physical in 3 months. 02/28/2021  Orders Placed This Encounter  Procedures   Fecal Occult Blood, Guaiac   CBC with Differential/Platelet   Comprehensive metabolic panel   TSH   Sedimentation rate   Lipid panel   LDL cholesterol, direct   No orders of the defined types were placed in this encounter.     I reviewed the patients updated PMH, FH, and SocHx.    Patient Active Problem List   Diagnosis  Date Noted   Acute deep vein thrombosis (DVT) of left lower extremity (HCC) 12/27/2019   Persistent atrial fibrillation ([place]) 08/01/2019   Secondary hypercoagulable state ([place]) 08/01/2019   Presence of biventricular cardiac pacemaker 04/05/2019   History of complete heart block 05/05/2017   Renal cyst 03/01/2017   Hydronephrosis with urinary obstruction due to renal calculus 03/01/2017   Total knee replacement status, bilateral 11/06/2016   Nonischemic cardiomyopathy ([place]) 11/17/2014   Upper airway cough syndrome 02/26/2014   Constipation, chronic  02/26/2014   GERD (gastroesophageal reflux disease) 11/19/2012   Dyslipidemia 09/02/2012   Left bundle branch block 12/18/2011   HTN (hypertension), benign 05/31/2011   Current Meds  Medication Sig   acetaminophen (TYLENOL) 500 MG tablet Take by mouth.   amLODipine (NORVASC) 5 MG tablet TAKE 1 TABLET BY MOUTH EVERY DAY   apixaban (ELIQUIS) 5 MG TABS tablet Take 1 tablet (5 mg total) by mouth 2 (two) times daily.   calcium-vitamin D (OSCAL WITH D) 500-200 MG-UNIT tablet Take 1 tablet by mouth.   Cholecalciferol 25 MCG (1000 UT) tablet Take by mouth.   cyanocobalamin 100 MCG tablet Take by mouth.   Iron-Vitamin C 100-250 MG TABS Take by mouth.   metoprolol tartrate (LOPRESSOR) 25 MG tablet Take 1 tablet (25 mg total) by mouth 2 (two) times daily.   omeprazole (PRILOSEC) 20 MG capsule TAKE 1 CAPSULE (20 MG TOTAL) BY MOUTH DAILY. PLEASE SCHEDULE FOLLOW UP APPT WITH DR. Jonni Sanger FOR FURTHER REFILLS   PEDIATRIC MULTIVITAMINS-IRON PO Take by mouth.    Allergies: Patient is allergic to penicillins, nickel, avapro [irbesartan], lisinopril, simvastatin, latex, sulfa antibiotics, and sulfamethoxazole. Family History: Patient family history includes Cancer in her father; Diabetes in her mother; Heart disease in her father and mother; Hyperlipidemia in her mother; Hypertension in her mother. Social History:  Patient  reports that she quit smoking about 62 years ago. Her smoking use included cigarettes. She has never used smokeless tobacco. She reports previous alcohol use of about 1.0 standard drink of alcohol per week. She reports that she does not use drugs.  Review of Systems: Constitutional: Negative for fever malaise or anorexia Cardiovascular: negative for chest pain Respiratory: negative for SOB or persistent cough Gastrointestinal: negative for abdominal pain  Objective  Vitals: BP (!) 147/85   Pulse 70   Temp 97.6 F (36.4 C) (Temporal)   Ht 5' (1.524 m)   Wt 156 lb 9.6 oz (71 kg)    SpO2 95%   BMI 30.58 kg/m  General: no acute distress , A&Ox3, appears well HEENT: PEERL, conjunctiva normal, neck is supple no temporal artery tenderness.  Supple neck. Cardiovascular:  RRR without murmur or gallop.  Respiratory:  Good breath sounds bilaterally, CTAB with normal respiratory effort Soft nontender abdomen. Skin:  Warm, no rashes Neuro: Nonfocal.    Commons side effects, risks, benefits, and alternatives for medications and treatment plan prescribed today were discussed, and the patient expressed understanding of the given instructions. Patient is instructed to call or message via MyChart if he/she has any questions or concerns regarding our treatment plan. No barriers to understanding were identified. We discussed Red Flag symptoms and signs in detail. Patient expressed understanding regarding what to do in case of urgent or emergency type symptoms.  Medication list was reconciled, printed and provided to the patient in AVS. Patient instructions and summary information was reviewed with the patient as documented in the AVS. This note was prepared with assistance of Dragon voice recognition software. Occasional  wrong-word or sound-a-like substitutions may have occurred due to the inherent limitations of voice recognition software  This visit occurred during the SARS-CoV-2 public health emergency.  Safety protocols were in place, including screening questions prior to the visit, additional usage of staff PPE, and extensive cleaning of exam room while observing appropriate contact time as indicated for disinfecting solutions.

## 2020-09-07 NOTE — Patient Instructions (Signed)
Please return in 2-4 weeks for recheck headaches.  Please schedule a complete physical in 3-4 months.   I will release your lab results to you on your MyChart account with further instructions. Please reply with any questions.   Please return the stool cards looking for blood in your stool.   Avoid further tylenol or headache medications for the next 48 hours if possible. Drink more fluids to see if this helps. Avoid overheating.   If you have any questions or concerns, please don't hesitate to send me a message via MyChart or call the office at [phone]. Thank you for visiting with [place] today! It's our pleasure caring for you.   General Headache Without Cause A headache is pain or discomfort felt around the head or neck area. The specific cause of a headache may not be found. There are many causes and types of headaches. A few common ones are: Tension headaches. Migraine headaches. Cluster headaches. Chronic daily headaches. Follow these instructions at home: Watch your condition for any changes. Let your health care provider know aboutthem. Take these steps to help with your condition: Managing pain     Take over-the-counter and prescription medicines only as told by your health care provider. Lie down in a dark, quiet room when you have a headache. If directed, put ice on your head and neck area: Put ice in a plastic bag. Place a towel between your skin and the bag. Leave the ice on for 20 minutes, 2-3 times per day. If directed, apply heat to the affected area. Use the heat source that your health care provider recommends, such as a moist heat pack or a heating pad. Place a towel between your skin and the heat source. Leave the heat on for 20-30 minutes. Remove the heat if your skin turns bright red. This is especially important if you are unable to feel pain, heat, or cold. You may have a greater risk of getting burned. Keep lights dim if bright lights bother you or make your  headaches worse. Eating and drinking Eat meals on a regular schedule. If you drink alcohol: Limit how much you use to: 0-1 drink a day for women. 0-2 drinks a day for men. Be aware of how much alcohol is in your drink. In the [place] one drink equals one 12 oz bottle of beer (355 mL), one 5 oz glass of wine (148 mL), or one 1 oz glass of hard liquor (44 mL). Stop drinking caffeine, or decrease the amount of caffeine you drink. General instructions  Keep a headache journal to help find out what may trigger your headaches. For example, write down: What you eat and drink. How much sleep you get. Any change to your diet or medicines. Try massage or other relaxation techniques. Limit stress. Sit up straight, and do not tense your muscles. Do not use any products that contain nicotine or tobacco, such as cigarettes, e-cigarettes, and chewing tobacco. If you need help quitting, ask your health care provider. Exercise regularly as told by your health care provider. Sleep on a regular schedule. Get 7-9 hours of sleep each night, or the amount recommended by your health care provider. Keep all follow-up visits as told by your health care provider. This is important.  Contact a health care provider if: Your symptoms are not helped by medicine. You have a headache that is different from the usual headache. You have nausea or you vomit. You have a fever. Get help right away if: Your  headache becomes severe quickly. Your headache gets worse after moderate to intense physical activity. You have repeated vomiting. You have a stiff neck. You have a loss of vision. You have problems with speech. You have pain in the eye or ear. You have muscular weakness or loss of muscle control. You lose your balance or have trouble walking. You feel faint or pass out. You have confusion. You have a seizure. Summary A headache is pain or discomfort felt around the head or neck area. There are many causes  and types of headaches. In some cases, the cause may not be found. Keep a headache journal to help find out what may trigger your headaches. Watch your condition for any changes. Let your health care provider know about them. Contact a health care provider if you have a headache that is different from the usual headache, or if your symptoms are not helped by medicine. Get help right away if your headache becomes severe, you vomit, you have a loss of vision, you lose your balance, or you have a seizure. This information is not intended to replace advice given to you by your health care provider. Make sure you discuss any questions you have with your healthcare provider. Document Revised: 09/21/2017 Document Reviewed: 09/21/2017 Elsevier Patient Education  [place].

## 2020-09-14 ENCOUNTER — Other Ambulatory Visit (INDEPENDENT_AMBULATORY_CARE_PROVIDER_SITE_OTHER): Payer: Medicare HMO

## 2020-09-14 DIAGNOSIS — Z8719 Personal history of other diseases of the digestive system: Secondary | ICD-10-CM | POA: Diagnosis not present

## 2020-09-14 DIAGNOSIS — K921 Melena: Secondary | ICD-10-CM | POA: Diagnosis not present

## 2020-09-14 DIAGNOSIS — Z7901 Long term (current) use of anticoagulants: Secondary | ICD-10-CM

## 2020-09-14 LAB — FECAL OCCULT BLOOD, IMMUNOCHEMICAL: Fecal Occult Bld: POSITIVE — AB

## 2020-09-18 ENCOUNTER — Other Ambulatory Visit: Payer: Self-pay

## 2020-09-18 ENCOUNTER — Telehealth: Payer: Self-pay

## 2020-09-18 DIAGNOSIS — M5136 Other intervertebral disc degeneration, lumbar region: Secondary | ICD-10-CM | POA: Diagnosis not present

## 2020-09-18 DIAGNOSIS — M9903 Segmental and somatic dysfunction of lumbar region: Secondary | ICD-10-CM | POA: Diagnosis not present

## 2020-09-18 DIAGNOSIS — Z8719 Personal history of other diseases of the digestive system: Secondary | ICD-10-CM

## 2020-09-18 DIAGNOSIS — M545 Low back pain, unspecified: Secondary | ICD-10-CM | POA: Diagnosis not present

## 2020-09-18 DIAGNOSIS — R195 Other fecal abnormalities: Secondary | ICD-10-CM

## 2020-09-18 DIAGNOSIS — N390 Urinary tract infection, site not specified: Secondary | ICD-10-CM | POA: Diagnosis not present

## 2020-09-18 NOTE — Telephone Encounter (Signed)
Granddaughter given result note from Dr. Yong Channel

## 2020-09-18 NOTE — Telephone Encounter (Signed)
Patient's granddaughter called asking about lab results. She would like a nurse to call her to go over them. [phone]

## 2020-10-04 ENCOUNTER — Ambulatory Visit (INDEPENDENT_AMBULATORY_CARE_PROVIDER_SITE_OTHER): Payer: Medicare HMO | Admitting: Family Medicine

## 2020-10-04 ENCOUNTER — Encounter: Payer: Self-pay | Admitting: Family Medicine

## 2020-10-04 ENCOUNTER — Other Ambulatory Visit: Payer: Self-pay

## 2020-10-04 ENCOUNTER — Other Ambulatory Visit: Payer: Self-pay | Admitting: Family Medicine

## 2020-10-04 VITALS — BP 123/81 | HR 70 | Temp 97.9°F | Ht 60.0 in | Wt 155.9 lb

## 2020-10-04 DIAGNOSIS — N3281 Overactive bladder: Secondary | ICD-10-CM | POA: Diagnosis not present

## 2020-10-04 DIAGNOSIS — Z7901 Long term (current) use of anticoagulants: Secondary | ICD-10-CM

## 2020-10-04 DIAGNOSIS — Z8719 Personal history of other diseases of the digestive system: Secondary | ICD-10-CM | POA: Diagnosis not present

## 2020-10-04 DIAGNOSIS — R195 Other fecal abnormalities: Secondary | ICD-10-CM | POA: Diagnosis not present

## 2020-10-04 DIAGNOSIS — R519 Headache, unspecified: Secondary | ICD-10-CM | POA: Diagnosis not present

## 2020-10-04 DIAGNOSIS — R350 Frequency of micturition: Secondary | ICD-10-CM

## 2020-10-04 LAB — POCT URINALYSIS DIPSTICK
Bilirubin, UA: NEGATIVE
Blood, UA: NEGATIVE
Glucose, UA: NEGATIVE
Ketones, UA: NEGATIVE
Leukocytes, UA: NEGATIVE
Nitrite, UA: NEGATIVE
Protein, UA: NEGATIVE
Spec Grav, UA: 1.015 (ref 1.010–1.025)
Urobilinogen, UA: NEGATIVE E.U./dL — AB
pH, UA: 7.5 (ref 5.0–8.0)

## 2020-10-04 MED ORDER — SOLIFENACIN SUCCINATE 5 MG PO TABS: 5.0000 mg | ORAL_TABLET | Freq: Every day | ORAL | 3 refills | Status: DC

## 2020-10-04 NOTE — Telephone Encounter (Signed)
Please see message from pharmacy and advise of okay to change medication.

## 2020-10-04 NOTE — Patient Instructions (Addendum)
Please follow up as scheduled for your next visit with me: 01/04/2021   Try taking the vesicare to see if helps your bladder symptoms. There are no signs of infection now.   If you have any questions or concerns, please don't hesitate to send me a message via MyChart or call the office at [phone]. Thank you for visiting with [place] today! It's our  pleasure caring for you.

## 2020-10-04 NOTE — Progress Notes (Signed)
Subjective  CC:  Chief Complaint  Patient presents with   Follow-up   Headache    No headaches at this time    Urinary Tract Infection    Frequency urination  About 4-5 time at night     HPI: Madison White is a 87 y.o. female who presents to the office today to address the problems listed above in the chief complaint. Headache f/u: see last note. Chronic headaches with acute worsening at last visit. After interview, unremarkable labs and directions to push fluids and we thought they could be related to dehydrations. Since making this change, she has not had any more headaches.  C/o nocturia 4-5/night and some frequency w/o irritative sxs during the day. Interfering with sleep. Mild urge sxs. Good bladder emptying. Rare incontinence.  Guaiac positive stools w/ normal hgb. H/o UGI bleed. On anticoagulants. Has been referred to GI; possible slow bleed but not anemic. On ppi. Monitor for now given age. No gi pain  Assessment  1. Frequency of urination   2. Overactive bladder   3. Occult blood in stools   4. History of GI bleed   5. Frontal headache   6. Chronic anticoagulation      Plan  Headaches :  improved with better hydration.  OAB and nocturia: trial of vesicare. Education given.  Guaiac positive stool; last colonoscpy 3 or 4 years ago. Possible UGI. On PPI. Red flags discussed. Monitor. To GI or ER if BRBPR or sxs of anemia or pain.    Follow up: No follow-ups on file.  01/04/2021  Orders Placed This Encounter  Procedures   Urine Culture   POCT Urinalysis Dipstick   Meds ordered this encounter  Medications   solifenacin (VESICARE) 5 MG tablet    Sig: Take 1 tablet (5 mg total) by mouth daily.    Dispense:  90 tablet    Refill:  3      I reviewed the patients updated PMH, FH, and SocHx.    Patient Active Problem List   Diagnosis Date Noted   Acute deep vein thrombosis (DVT) of left lower extremity (HCC) 12/27/2019   Persistent atrial fibrillation ([place])  08/01/2019   Secondary hypercoagulable state ([place]) 08/01/2019   Presence of biventricular cardiac pacemaker 04/05/2019   History of complete heart block 05/05/2017   Renal cyst 03/01/2017   Hydronephrosis with urinary obstruction due to renal calculus 03/01/2017   Total knee replacement status, bilateral 11/06/2016   Nonischemic cardiomyopathy ([place]) 11/17/2014   Upper airway cough syndrome 02/26/2014   Constipation, chronic 02/26/2014   GERD (gastroesophageal reflux disease) 11/19/2012   Dyslipidemia 09/02/2012   Left bundle branch block 12/18/2011   HTN (hypertension), benign 05/31/2011   Current Meds  Medication Sig   acetaminophen (TYLENOL) 500 MG tablet Take by mouth.   amLODipine (NORVASC) 5 MG tablet TAKE 1 TABLET BY MOUTH EVERY DAY   apixaban (ELIQUIS) 5 MG TABS tablet Take 1 tablet (5 mg total) by mouth 2 (two) times daily.   calcium-vitamin D (OSCAL WITH D) 500-200 MG-UNIT tablet Take 1 tablet by mouth.   Cholecalciferol 25 MCG (1000 UT) tablet Take by mouth.   cyanocobalamin 100 MCG tablet Take by mouth.   Iron-Vitamin C 100-250 MG TABS Take by mouth.   metoprolol tartrate (LOPRESSOR) 25 MG tablet Take 1 tablet (25 mg total) by mouth 2 (two) times daily.   omeprazole (PRILOSEC) 20 MG capsule TAKE 1 CAPSULE (20 MG TOTAL) BY MOUTH DAILY. PLEASE SCHEDULE FOLLOW UP  APPT WITH DR. Jonni Sanger FOR FURTHER REFILLS   PEDIATRIC MULTIVITAMINS-IRON PO Take by mouth.   solifenacin (VESICARE) 5 MG tablet Take 1 tablet (5 mg total) by mouth daily.    Allergies: Patient is allergic to penicillins, nickel, avapro [irbesartan], lisinopril, simvastatin, latex, sulfa antibiotics, and sulfamethoxazole. Family History: Patient family history includes Cancer in her father; Diabetes in her mother; Heart disease in her father and mother; Hyperlipidemia in her mother; Hypertension in her mother. Social History:  Patient  reports that she quit smoking about 62 years ago. Her smoking use included  cigarettes. She has never used smokeless tobacco. She reports previous alcohol use of about 1.0 standard drink of alcohol per week. She reports that she does not use drugs.  Review of Systems: Constitutional: Negative for fever malaise or anorexia Cardiovascular: negative for chest pain Respiratory: negative for SOB or persistent cough Gastrointestinal: negative for abdominal pain  Objective  Vitals: BP 123/81   Pulse 70   Temp 97.9 F (36.6 C) (Temporal)   Ht 5' (1.524 m)   Wt 155 lb 14.4 oz (70.7 kg)   SpO2 96%   BMI 30.45 kg/m  General: no acute distress , A&Ox3 HEENT: PEERL, conjunctiva normal, neck is supple Cardiovascular:  RRR without murmur or gallop.  Respiratory:  Good breath sounds bilaterally, CTAB with normal respiratory effort Skin:  Warm, no rashes    Commons side effects, risks, benefits, and alternatives for medications and treatment plan prescribed today were discussed, and the patient expressed understanding of the given instructions. Patient is instructed to call or message via MyChart if he/she has any questions or concerns regarding our treatment plan. No barriers to understanding were identified. We discussed Red Flag symptoms and signs in detail. Patient expressed understanding regarding what to do in case of urgent or emergency type symptoms.  Medication list was reconciled, printed and provided to the patient in AVS. Patient instructions and summary information was reviewed with the patient as documented in the AVS. This note was prepared with assistance of Dragon voice recognition software. Occasional wrong-word or sound-a-like substitutions may have occurred due to the inherent limitations of voice recognition software  This visit occurred during the SARS-CoV-2 public health emergency.  Safety protocols were in place, including screening questions prior to the visit, additional usage of staff PPE, and extensive cleaning of exam room while observing  appropriate contact time as indicated for disinfecting solutions.

## 2020-10-05 ENCOUNTER — Telehealth: Payer: Self-pay | Admitting: Family Medicine

## 2020-10-05 LAB — URINE CULTURE
MICRO NUMBER:: 12147162
SPECIMEN QUALITY:: ADEQUATE

## 2020-10-05 NOTE — Telephone Encounter (Signed)
Patient called and would like a call back.

## 2020-10-05 NOTE — Chronic Care Management (AMB) (Signed)
  Chronic Care Management   Outreach Note  10/05/2020 Name: Madison White MRN: [id number] DOB: 04/16/1928  Referred by: Leamon Arnt, MD Reason for referral : No chief complaint on file.   Third unsuccessful telephone outreach was attempted today. The patient was referred to the pharmacist for assistance with care management and care coordination.   Follow Up Plan:   Lauretta Grill Upstream Scheduler

## 2020-11-05 ENCOUNTER — Ambulatory Visit (INDEPENDENT_AMBULATORY_CARE_PROVIDER_SITE_OTHER): Payer: Medicare HMO

## 2020-11-05 DIAGNOSIS — I442 Atrioventricular block, complete: Secondary | ICD-10-CM

## 2020-11-05 LAB — CUP PACEART REMOTE DEVICE CHECK
Battery Remaining Longevity: 46 mo
Battery Remaining Percentage: 53 %
Battery Voltage: 2.96 V
Brady Statistic AP VP Percent: 3.2 %
Brady Statistic AP VS Percent: 1 %
Brady Statistic AS VP Percent: 95 %
Brady Statistic AS VS Percent: 1.4 %
Brady Statistic RA Percent Paced: 3.9 %
Date Time Interrogation Session: 20220822020017
Implantable Lead Implant Date: 20190219
Implantable Lead Implant Date: 20190219
Implantable Lead Implant Date: 20190219
Implantable Lead Location: 753858
Implantable Lead Location: 753859
Implantable Lead Location: 753860
Implantable Pulse Generator Implant Date: 20190219
Lead Channel Impedance Value: 490 Ohm
Lead Channel Impedance Value: 650 Ohm
Lead Channel Impedance Value: 860 Ohm
Lead Channel Pacing Threshold Amplitude: 0.5 V
Lead Channel Pacing Threshold Amplitude: 0.75 V
Lead Channel Pacing Threshold Amplitude: 2 V
Lead Channel Pacing Threshold Pulse Width: 0.5 ms
Lead Channel Pacing Threshold Pulse Width: 0.5 ms
Lead Channel Pacing Threshold Pulse Width: 1 ms
Lead Channel Sensing Intrinsic Amplitude: 12 mV
Lead Channel Sensing Intrinsic Amplitude: 2.2 mV
Lead Channel Setting Pacing Amplitude: 2 V
Lead Channel Setting Pacing Amplitude: 2.5 V
Lead Channel Setting Pacing Amplitude: 2.5 V
Lead Channel Setting Pacing Pulse Width: 0.5 ms
Lead Channel Setting Pacing Pulse Width: 1 ms
Lead Channel Setting Sensing Sensitivity: 4 mV
Pulse Gen Model: 3262
Pulse Gen Serial Number: 8995296

## 2020-11-21 NOTE — Progress Notes (Signed)
Remote pacemaker transmission.   

## 2020-11-27 ENCOUNTER — Other Ambulatory Visit: Payer: Self-pay | Admitting: Family Medicine

## 2021-01-04 ENCOUNTER — Encounter: Payer: Medicare HMO | Admitting: Family Medicine

## 2021-02-04 ENCOUNTER — Ambulatory Visit (INDEPENDENT_AMBULATORY_CARE_PROVIDER_SITE_OTHER): Payer: Medicare HMO

## 2021-02-04 DIAGNOSIS — I442 Atrioventricular block, complete: Secondary | ICD-10-CM | POA: Diagnosis not present

## 2021-02-04 LAB — CUP PACEART REMOTE DEVICE CHECK
Battery Remaining Longevity: 44 mo
Battery Remaining Percentage: 50 %
Battery Voltage: 2.96 V
Brady Statistic AP VP Percent: 3.2 %
Brady Statistic AP VS Percent: 1 %
Brady Statistic AS VP Percent: 95 %
Brady Statistic AS VS Percent: 1.4 %
Brady Statistic RA Percent Paced: 6.9 %
Date Time Interrogation Session: 20221121020013
Implantable Lead Implant Date: 20190219
Implantable Lead Implant Date: 20190219
Implantable Lead Implant Date: 20190219
Implantable Lead Location: 753858
Implantable Lead Location: 753859
Implantable Lead Location: 753860
Implantable Pulse Generator Implant Date: 20190219
Lead Channel Impedance Value: 510 Ohm
Lead Channel Impedance Value: 660 Ohm
Lead Channel Impedance Value: 980 Ohm
Lead Channel Pacing Threshold Amplitude: 0.5 V
Lead Channel Pacing Threshold Amplitude: 0.75 V
Lead Channel Pacing Threshold Amplitude: 2 V
Lead Channel Pacing Threshold Pulse Width: 0.5 ms
Lead Channel Pacing Threshold Pulse Width: 0.5 ms
Lead Channel Pacing Threshold Pulse Width: 1 ms
Lead Channel Sensing Intrinsic Amplitude: 2.2 mV
Lead Channel Sensing Intrinsic Amplitude: 7.7 mV
Lead Channel Setting Pacing Amplitude: 2 V
Lead Channel Setting Pacing Amplitude: 2.5 V
Lead Channel Setting Pacing Amplitude: 2.5 V
Lead Channel Setting Pacing Pulse Width: 0.5 ms
Lead Channel Setting Pacing Pulse Width: 1 ms
Lead Channel Setting Sensing Sensitivity: 4 mV
Pulse Gen Model: 3262
Pulse Gen Serial Number: 8995296

## 2021-02-05 ENCOUNTER — Telehealth: Payer: Self-pay | Admitting: Family Medicine

## 2021-02-05 NOTE — Telephone Encounter (Signed)
Copied from [place] [id number]. Topic: Medicare AWV >> Feb 05, 2021 11:20 AM Harris-Coley, Hannah Beat wrote: Reason for CRM: Left message 02/05/21 for patient to reschedule Annual Wellness Visit.  Please schedule with Nurse Health Advisor Charlott Rakes, RN at [facility].  Please call [phone] ask for [facility]

## 2021-02-13 NOTE — Progress Notes (Signed)
Remote pacemaker transmission.   

## 2021-02-28 ENCOUNTER — Ambulatory Visit: Payer: Medicare HMO

## 2021-03-01 ENCOUNTER — Ambulatory Visit (INDEPENDENT_AMBULATORY_CARE_PROVIDER_SITE_OTHER): Payer: Medicare HMO | Admitting: Physician Assistant

## 2021-03-01 ENCOUNTER — Encounter: Payer: Self-pay | Admitting: Physician Assistant

## 2021-03-01 VITALS — BP 130/62

## 2021-03-01 DIAGNOSIS — R059 Cough, unspecified: Secondary | ICD-10-CM | POA: Diagnosis not present

## 2021-03-01 DIAGNOSIS — J029 Acute pharyngitis, unspecified: Secondary | ICD-10-CM

## 2021-03-01 DIAGNOSIS — R3915 Urgency of urination: Secondary | ICD-10-CM

## 2021-03-01 LAB — POC INFLUENZA A&B (BINAX/QUICKVUE)
Influenza A, POC: NEGATIVE
Influenza B, POC: NEGATIVE

## 2021-03-01 LAB — POC COVID19 BINAXNOW: SARS Coronavirus 2 Ag: NEGATIVE

## 2021-03-01 LAB — POCT RAPID STREP A (OFFICE): Rapid Strep A Screen: NEGATIVE

## 2021-03-01 MED ORDER — NITROFURANTOIN MONOHYD MACRO 100 MG PO CAPS: 100.0000 mg | ORAL_CAPSULE | Freq: Two times a day (BID) | ORAL | 0 refills | Status: DC

## 2021-03-01 NOTE — Progress Notes (Signed)
Madison White is a 87 y.o. female here for a new problem.   History of Present Illness:   Chief Complaint  Patient presents with   Cough   Sore Throat    HPI  URI Madison White presents with c/o sore throat, coughing, congestion, and chills that have been onset since yesterday. States that her daughters were recently sick with a cold prior to pt experiencing sx. According to Madison White, her daughters did test negative for influenza and COVID. In an effort to manage her sx, Madison White took some nyquil which provided minor relief. Denies fever. Per daughters has SOB at baseline but denies worsening SOB at this time.  She experienced weakness this morning but this has not worsened throughout the day. She is resting and drinking well. Denies poor appetite.    Urinary Frequency In addtion to current sx, she has also been experiencing urinary frequency as well as urgency incontinence. Madison White states this is not usual for her but does not believe urinary frequency to be related to her other sx. Denies hematuria, bowel incontinence, back pain, n/v or burning with urination.     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   History of blood transfusion    History of rheumatic fever    HTN (hypertension)    hx of refusing treatment--White coat HTN and/or situational HTN (?)    Hyperlipidemia    hx of refusing treatment   Insulin resistance    A1c's excellent per old records (6.1 in 2008 and 2009)   Left bundle branch block 12/2011   Dr. Sallyanne Kuster at [facility]; ECHO and myocardial perfusion scan showed  septal and apical wall motion  abnormality but she had no significant valvular disease and no ischemia.  She did have mildly decreased EF (39% on lexiscan and 50% on echo) and abnl LV relaxation.  Mild amount of PVCs.  Pt at higher risk for other conduction abnormalities, good chance of eventually requiring a pacemaker.    LVH (left ventricular hypertrophy)    Nonischemic cardiomyopathy ([place]) 2016   LV dysfunction due to LBBB/septal dyssynchrony   Osteoarthritis    bilat knees--needs bilat TKA.  Ortho is trying steroid injections as of 10/23/15.   Presence of biventricular cardiac pacemaker 04/05/2019   Presence of permanent cardiac pacemaker 05/05/2017   Seasonal allergies    Solitary pulmonary nodule 05/2016   8 mm pleural based nodule in RLL.  Radiology recommended f/u noncontrast CT in 6-12 mo.     Social History   Tobacco Use   Smoking status: Former    Types: Cigarettes    Quit date: 05/31/1958    Years since quitting: 62.7   Smokeless tobacco: Never  Vaping Use   Vaping Use: Never used  Substance Use Topics   Alcohol use: Not Currently    Alcohol/week: 1.0 standard drink    Types: 1 Glasses of wine per week   Drug use: No    Past Surgical History:  Procedure Laterality Date   ABDOMINAL HYSTERECTOMY  1963   At the time of her last C/S; says she had partial bladder  resection at that time as well   BI-VENTRICULAR PACEMAKER INSERTION (CRT-P)  05/05/2017   BIV PACEMAKER INSERTION CRT-P N/A 05/05/2017   Procedure: BIV PACEMAKER INSERTION CRT-P;  Surgeon: Evans Lance, MD;  Location: [facility];  Service: Cardiovascular;  Laterality: N/A;   CARDIOVASCULAR STRESS TEST  08/2005 & 12/2011   Low risk scans (on the 12/2011 scan she did have EF 39% with moderately severe LV dysfunction with septal dyssynergy probably contributed by LBBB   CARDIOVERSION N/A 08/09/2019   Procedure: CARDIOVERSION;  Surgeon: Pixie Casino, MD;  Location: [facility] ENDOSCOPY;  Service: Cardiovascular;  Laterality: N/A;   Carotid  dopplers  11/22/14; 11/30/16   NORMAL 2016 and 2018   CATARACT EXTRACTION  08/14/11   both   CESAREAN SECTION  X 5   One of her neonates died soon after birth   CHOLECYSTECTOMY     1980's   COLONOSCOPY WITH PROPOFOL N/A 09/02/2017   Procedure: COLONOSCOPY WITH PROPOFOL;  Surgeon: Ladene Artist, MD;  Location: [facility] ENDOSCOPY;  Service: Endoscopy;  Laterality: N/A;   HOT HEMOSTASIS N/A 09/02/2017   Procedure: HOT HEMOSTASIS (ARGON PLASMA COAGULATION/BICAP);  Surgeon: Ladene Artist, MD;  Location: [facility] ENDOSCOPY;  Service: Endoscopy;  Laterality: N/A;   INSERT / REPLACE / REMOVE PACEMAKER     JOINT REPLACEMENT Bilateral    knee   right knee surgery Right 05/2016   ? R TKA: no records.   STRABISMUS SURGERY  1939   TONSILLECTOMY  1938   TRANSTHORACIC ECHOCARDIOGRAM  01/13/12   Septal dyssynergy, EF 50%, LV relaxation impaired.  No significant valvular abnormalities.    Family History  Problem Relation Age of Onset   Hypertension Mother    Heart disease Mother    Hyperlipidemia Mother    Diabetes Mother    Heart disease Father    Cancer Father     Allergies  Allergen Reactions   Penicillins Hives and Swelling    Has patient had a PCN reaction causing immediate rash, facial/tongue/throat swelling, SOB or lightheadedness with hypotension: Yes Has patient had a PCN reaction causing severe rash involving mucus membranes or skin necrosis: No Has patient had a PCN reaction that required hospitalization: No Has patient had a PCN reaction occurring within the last 10 years: No If all of the above answers are "NO", then may proceed with Cephalosporin use.    Nickel Swelling and Rash   Avapro [Irbesartan] Other (See Comments)    Unknown rxn   Lisinopril Swelling    Swelling around eyes; also says it caused increased sugar and BP   Simvastatin Other (See Comments)    unknown   Latex Rash   Sulfa Antibiotics Rash   Sulfamethoxazole Rash    Current Medications:   Current Outpatient  Medications:    nitrofurantoin, macrocrystal-monohydrate, (MACROBID) 100 MG capsule, Take 1 capsule (100 mg total) by mouth 2 (two) times daily., Disp: 10 capsule, Rfl: 0   acetaminophen (TYLENOL) 500 MG tablet, Take by mouth., Disp: , Rfl:    amLODipine (NORVASC) 5 MG tablet, TAKE 1 TABLET BY MOUTH EVERY DAY, Disp: 90 tablet, Rfl: 3   apixaban (ELIQUIS) 5 MG TABS tablet, Take 1 tablet (5 mg total) by mouth 2 (two) times daily., Disp: 180 tablet, Rfl: 1   calcium-vitamin D (OSCAL WITH D) 500-200 MG-UNIT tablet, Take 1 tablet by mouth., Disp: , Rfl:    Cholecalciferol 25 MCG (1000 UT) tablet, Take by mouth., Disp: , Rfl:    cyanocobalamin 100  MCG tablet, Take by mouth., Disp: , Rfl:    Iron-Vitamin C 100-250 MG TABS, Take by mouth., Disp: , Rfl:    metoprolol tartrate (LOPRESSOR) 25 MG tablet, TAKE 1 TABLET BY MOUTH TWICE A DAY, Disp: 180 tablet, Rfl: 3   mirabegron ER (MYRBETRIQ) 25 MG TB24 tablet, Take 1 tablet (25 mg total) by mouth daily., Disp: 30 tablet, Rfl: 5   omeprazole (PRILOSEC) 20 MG capsule, TAKE 1 CAPSULE (20 MG TOTAL) BY MOUTH DAILY. PLEASE SCHEDULE FOLLOW UP APPT WITH DR. Jonni Sanger FOR FURTHER REFILLS, Disp: 30 capsule, Rfl: 0   PEDIATRIC MULTIVITAMINS-IRON PO, Take by mouth., Disp: , Rfl:    Review of Systems:   ROS Negative unless otherwise specified per HPI.  Vitals:   Vitals:   03/01/21 1620  BP: 130/62     There is no height or weight on file to calculate BMI.  Physical Exam:   Physical Exam Vitals and nursing note reviewed.  Constitutional:      General: She is not in acute distress.    Appearance: She is well-developed. She is not ill-appearing or toxic-appearing.  Cardiovascular:     Rate and Rhythm: Normal rate and regular rhythm.     Pulses: Normal pulses.     Heart sounds: Normal heart sounds, S1 normal and S2 normal.  Pulmonary:     Effort: Pulmonary effort is normal.     Breath sounds: Normal breath sounds.  Skin:    General: Skin is warm and dry.   Neurological:     Mental Status: She is alert.     GCS: GCS eye subscore is 4. GCS verbal subscore is 5. GCS motor subscore is 6.  Psychiatric:        Speech: Speech normal.        Behavior: Behavior normal. Behavior is cooperative.   Results for orders placed or performed in visit on 03/01/21  POC Influenza A&B(BINAX/QUICKVUE)  Result Value Ref Range   Influenza A, POC Negative Negative   Influenza B, POC Negative Negative  POCT rapid strep A  Result Value Ref Range   Rapid Strep A Screen Negative Negative  POC COVID-19  Result Value Ref Range   SARS Coronavirus 2 Ag Negative Negative     Assessment and Plan:   Cough, unspecified type; Sore Throat No red flags on exam. Suspect viral URI. Strep, COVID and flu test negative. Daughters have pulse ox at home and will help monitor her oxygen -- if weakness continues/worsens or if oxygen decreases consistently under 90%,r recommend ER evaluation. Reviewed return precautions including fever, SOB, worsening cough or other concerns. Push fluids and rest. I recommend that patient follow-up if symptoms worsen or persist despite treatment x 7-10 days, sooner if needed.  Urinary urgency Symptoms concerning for UTI Will empirically start oral macrobid Urine culture sent Worsening precautions advised  Inda Coke, PA-C

## 2021-03-01 NOTE — Patient Instructions (Signed)
It was great to see you!  Start oral macrobid for possible UTI  If oxygen remains under 90% or worsening shortness of breath -- GO TO ER  You have a viral upper respiratory infection. Antibiotics are not needed for this.  Viral infections usually take 7-10 days to resolve.  The cough can last a few weeks to go away.  Push fluids and get plenty of rest. Please return if you are not improving as expected, or if you have high fevers (>101.5) or difficulty swallowing or worsening productive cough.  Call clinic with questions.  I hope you start feeling better soon!

## 2021-03-04 LAB — URINE CULTURE
MICRO NUMBER:: 12767755
SPECIMEN QUALITY:: ADEQUATE

## 2021-03-08 ENCOUNTER — Ambulatory Visit (INDEPENDENT_AMBULATORY_CARE_PROVIDER_SITE_OTHER): Payer: Medicare HMO | Admitting: Family Medicine

## 2021-03-08 ENCOUNTER — Encounter: Payer: Self-pay | Admitting: Family Medicine

## 2021-03-08 VITALS — BP 138/59 | HR 69 | Temp 97.9°F | Wt 156.2 lb

## 2021-03-08 DIAGNOSIS — J208 Acute bronchitis due to other specified organisms: Secondary | ICD-10-CM

## 2021-03-08 DIAGNOSIS — B9689 Other specified bacterial agents as the cause of diseases classified elsewhere: Secondary | ICD-10-CM

## 2021-03-08 MED ORDER — AZITHROMYCIN 250 MG PO TABS: ORAL_TABLET | ORAL | 0 refills | Status: DC

## 2021-03-08 NOTE — Progress Notes (Signed)
Subjective  CC:  Chief Complaint  Patient presents with   Cough    Ongoing for a week, Productive cough. Yellow mucus   Shortness of Breath    HPI: SUBJECTIVE:  Madison White is a 87 y.o. female who complains of congestion, nasal blockage, post nasal drip, cough described as productive of yellow and green sputum and denies sinus, high fevers, SOB, chest pain or significant GI symptoms.  Phlegm is described as thick.  Symptoms have been present for greater than a week.  I reviewed note from December 16: Diagnosed with viral URI.  Unfortunately symptoms have progressed.. She denies a history of anorexia, dizziness, vomiting and wheezing. She denies a history of asthma or COPD. Patient does not smoke cigarettes.  She has chronic mild shortness of breath and this is unchanged with his current illness.  Her daughters were both sick as well but they have recovered.  All have been tested and has been negative for COVID multiple times.  She denies pleuritic chest pain, chest pain or lower extremity edema.  No palpitations.  She does have a history of A. fib.  Assessment  1. Acute bacterial bronchitis      Plan  Discussion:  Treat for bacterial bronchitis due to prolonged course and worsening symptoms. Education regarding differences between viral and bacterial infections and treatment options are discussed.  Supportive care measures are recommended.  We discussed the use of mucolytic's, decongestants, antihistamines and antitussives as needed.  Tylenol or Advil are recommended if needed.  Follow up: As scheduled  No orders of the defined types were placed in this encounter.  Meds ordered this encounter  Medications   azithromycin (ZITHROMAX) 250 MG tablet    Sig: Take 2 tabs today, then 1 tab daily for 4 days    Dispense:  1 each    Refill:  0      I reviewed the patients updated PMH, FH, and SocHx.  Social History: Patient  reports that she quit smoking about 62 years ago. Her  smoking use included cigarettes. She has never used smokeless tobacco. She reports that she does not currently use alcohol after a past usage of about 1.0 standard drink per week. She reports that she does not use drugs.  Patient Active Problem List   Diagnosis Date Noted   History of DVT of lower extremity left 12/27/2019   Persistent atrial fibrillation ([place]) 08/01/2019   Secondary hypercoagulable state ([place]) 08/01/2019   Presence of biventricular cardiac pacemaker 04/05/2019   History of complete heart block 05/05/2017   Renal cyst 03/01/2017   Hydronephrosis with urinary obstruction due to renal calculus 03/01/2017   Total knee replacement status, bilateral 11/06/2016   Nonischemic cardiomyopathy ([place]) 11/17/2014   Upper airway cough syndrome 02/26/2014   Constipation, chronic 02/26/2014   GERD (gastroesophageal reflux disease) 11/19/2012   Dyslipidemia 09/02/2012   Left bundle branch block 12/18/2011   HTN (hypertension), benign 05/31/2011    Review of Systems: Cardiovascular: negative for chest pain Respiratory: negative for SOB or hemoptysis Gastrointestinal: negative for abdominal pain Genitourinary: negative for dysuria or gross hematuria Current Meds  Medication Sig   acetaminophen (TYLENOL) 500 MG tablet Take by mouth.   amLODipine (NORVASC) 5 MG tablet TAKE 1 TABLET BY MOUTH EVERY DAY   apixaban (ELIQUIS) 5 MG TABS tablet Take 1 tablet (5 mg total) by mouth 2 (two) times daily.   azithromycin (ZITHROMAX) 250 MG tablet Take 2 tabs today, then 1 tab daily for 4 days  calcium-vitamin D (OSCAL WITH D) 500-200 MG-UNIT tablet Take 1 tablet by mouth.   Cholecalciferol 25 MCG (1000 UT) tablet Take by mouth.   cyanocobalamin 100 MCG tablet Take by mouth.   Iron-Vitamin C 100-250 MG TABS Take by mouth.   metoprolol tartrate (LOPRESSOR) 25 MG tablet TAKE 1 TABLET BY MOUTH TWICE A DAY   mirabegron ER (MYRBETRIQ) 25 MG TB24 tablet Take 1 tablet (25 mg total) by mouth daily.    omeprazole (PRILOSEC) 20 MG capsule TAKE 1 CAPSULE (20 MG TOTAL) BY MOUTH DAILY. PLEASE SCHEDULE FOLLOW UP APPT WITH DR. Jonni Sanger FOR FURTHER REFILLS   PEDIATRIC MULTIVITAMINS-IRON PO Take by mouth.    Objective  Vitals: BP (!) 138/59    Pulse 69    Temp 97.9 F (36.6 C) (Temporal)    Wt 156 lb 3.2 oz (70.9 kg)    SpO2 98%    BMI 30.51 kg/m  General: no acute distress, appears well Psych:  Alert and oriented, normal mood and affect HEENT:  Normocephalic, atraumatic, supple neck, moist mucous membranes, mildly erythematous pharynx without exudate, mild lymphadenopathy, supple neck Cardiovascular:  RRR without murmur. no edema Respiratory:  Good breath sounds bilaterally, CTAB with normal respiratory effort Skin:  Warm, no rashes Extremities no edema  Commons side effects, risks, benefits, and alternatives for medications and treatment plan prescribed today were discussed, and the patient expressed understanding of the given instructions. Patient is instructed to call or message via MyChart if he/she has any questions or concerns regarding our treatment plan. No barriers to understanding were identified. We discussed Red Flag symptoms and signs in detail. Patient expressed understanding regarding what to do in case of urgent or emergency type symptoms.  Medication list was reconciled, printed and provided to the patient in AVS. Patient instructions and summary information was reviewed with the patient as documented in the AVS. This note was prepared with assistance of Dragon voice recognition software. Occasional wrong-word or sound-a-like substitutions may have occurred due to the inherent limitations of voice recognition software

## 2021-03-08 NOTE — Patient Instructions (Signed)
Please follow up as scheduled for your next visit with me: 03/19/2021   If you have any questions or concerns, please don't hesitate to send me a message via MyChart or call the office at [phone]. Thank you for visiting with [place] today! It's our pleasure caring for you.   Try robitussin DM or mucinex DM for cough and take the antibiotic as prescribed.   Acute Bronchitis, Adult Acute bronchitis is sudden inflammation of the main airways (bronchi) that come off the windpipe (trachea) in the lungs. The swelling causes the airways to get smaller and make more mucus than normal. This can make it hard to breathe and can cause coughing or noisy breathing (wheezing). Acute bronchitis may last several weeks. The cough may last longer. Allergies, asthma, and exposure to smoke may make the condition worse. What are the causes? This condition can be caused by germs and by substances that irritate the lungs, including: Cold and flu viruses. The most common cause of this condition is the virus that causes the common cold. Bacteria. This is less common. Breathing in substances that irritate the lungs, including: Smoke from cigarettes and other forms of tobacco. Dust and pollen. Fumes from household cleaning products, gases, or burned fuel. Indoor or outdoor air pollution. What increases the risk? The following factors may make you more likely to develop this condition: A weak body's defense system, also called the immune system. A condition that affects your lungs and breathing, such as asthma. What are the signs or symptoms? Common symptoms of this condition include: Coughing. This may bring up clear, yellow, or green mucus from your lungs (sputum). Wheezing. Runny or stuffy nose. Having too much mucus in your lungs (chest congestion). Shortness of breath. Aches and pains, including sore throat or chest. How is this diagnosed? This condition is usually diagnosed based on: Your symptoms and medical  history. A physical exam. You may also have other tests, including tests to rule out other conditions, such as pneumonia. These tests include: A test of lung function. Test of a mucus sample to look for the presence of bacteria. Tests to check the oxygen level in your blood. Blood tests. Chest X-ray. How is this treated? Most cases of acute bronchitis clear up over time without treatment. Your health care provider may recommend: Drinking more fluids to help thin your mucus so it is easier to cough up. Taking inhaled medicine (inhaler) to improve air flow in and out of your lungs. Using a vaporizer or a humidifier. These are machines that add water to the air to help you breathe better. Taking a medicine that thins mucus and clears congestion (expectorant). Taking a medicine that prevents or stops coughing (cough suppressant). It is notcommon to take an antibiotic medicine for this condition. Follow these instructions at home:  Take over-the-counter and prescription medicines only as told by your health care provider. Use an inhaler, vaporizer, or humidifier as told by your health care provider. Take two teaspoons (10 mL) of honey at bedtime to lessen coughing at night. Drink enough fluid to keep your urine pale yellow. Do not use any products that contain nicotine or tobacco. These products include cigarettes, chewing tobacco, and vaping devices, such as e-cigarettes. If you need help quitting, ask your health care provider. Get plenty of rest. Return to your normal activities as told by your health care provider. Ask your health care provider what activities are safe for you. Keep all follow-up visits. This is important. How is this prevented?  To lower your risk of getting this condition again: Wash your hands often with soap and water for at least 20 seconds. If soap and water are not available, use hand sanitizer. Avoid contact with people who have cold symptoms. Try not to touch  your mouth, nose, or eyes with your hands. Avoid breathing in smoke or chemical fumes. Breathing smoke or chemical fumes will make your condition worse. Get the flu shot every year. Contact a health care provider if: Your symptoms do not improve after 2 weeks. You have trouble coughing up the mucus. Your cough keeps you awake at night. You have a fever. Get help right away if you: Cough up blood. Feel pain in your chest. Have severe shortness of breath. Faint or keep feeling like you are going to faint. Have a severe headache. Have a fever or chills that get worse. These symptoms may represent a serious problem that is an emergency. Do not wait to see if the symptoms will go away. Get medical help right away. Call your local emergency services (911 in the [place] Do not drive yourself to the hospital. Summary Acute bronchitis is inflammation of the main airways (bronchi) that come off the windpipe (trachea) in the lungs. The swelling causes the airways to get smaller and make more mucus than normal. Drinking more fluids can help thin your mucus so it is easier to cough up. Take over-the-counter and prescription medicines only as told by your health care provider. Do not use any products that contain nicotine or tobacco. These products include cigarettes, chewing tobacco, and vaping devices, such as e-cigarettes. If you need help quitting, ask your health care provider. Contact a health care provider if your symptoms do not improve after 2 weeks. This information is not intended to replace advice given to you by your health care provider. Make sure you discuss any questions you have with your health care provider. Document Revised: 07/04/2020 Document Reviewed: 07/04/2020 Elsevier Patient Education  [place].

## 2021-03-11 ENCOUNTER — Emergency Department (HOSPITAL_BASED_OUTPATIENT_CLINIC_OR_DEPARTMENT_OTHER)
Admission: EM | Admit: 2021-03-11 | Discharge: 2021-03-11 | Disposition: A | Discharge status: Home / Self Care | Payer: Medicare HMO | Attending: Emergency Medicine | Admitting: Emergency Medicine

## 2021-03-11 ENCOUNTER — Other Ambulatory Visit: Payer: Self-pay

## 2021-03-11 ENCOUNTER — Encounter (HOSPITAL_BASED_OUTPATIENT_CLINIC_OR_DEPARTMENT_OTHER): Payer: Self-pay | Admitting: Obstetrics and Gynecology

## 2021-03-11 ENCOUNTER — Emergency Department (HOSPITAL_BASED_OUTPATIENT_CLINIC_OR_DEPARTMENT_OTHER): Payer: Medicare HMO | Admitting: Radiology

## 2021-03-11 DIAGNOSIS — U071 COVID-19: Secondary | ICD-10-CM | POA: Diagnosis not present

## 2021-03-11 DIAGNOSIS — J069 Acute upper respiratory infection, unspecified: Secondary | ICD-10-CM

## 2021-03-11 DIAGNOSIS — Z950 Presence of cardiac pacemaker: Secondary | ICD-10-CM | POA: Insufficient documentation

## 2021-03-11 DIAGNOSIS — Z87891 Personal history of nicotine dependence: Secondary | ICD-10-CM | POA: Diagnosis not present

## 2021-03-11 DIAGNOSIS — R059 Cough, unspecified: Secondary | ICD-10-CM | POA: Diagnosis not present

## 2021-03-11 DIAGNOSIS — B9789 Other viral agents as the cause of diseases classified elsewhere: Secondary | ICD-10-CM | POA: Diagnosis not present

## 2021-03-11 DIAGNOSIS — Z96653 Presence of artificial knee joint, bilateral: Secondary | ICD-10-CM | POA: Insufficient documentation

## 2021-03-11 DIAGNOSIS — I4819 Other persistent atrial fibrillation: Secondary | ICD-10-CM | POA: Diagnosis not present

## 2021-03-11 DIAGNOSIS — I5030 Unspecified diastolic (congestive) heart failure: Secondary | ICD-10-CM | POA: Diagnosis not present

## 2021-03-11 DIAGNOSIS — J9811 Atelectasis: Secondary | ICD-10-CM | POA: Diagnosis not present

## 2021-03-11 DIAGNOSIS — I110 Hypertensive heart disease with heart failure: Secondary | ICD-10-CM | POA: Diagnosis not present

## 2021-03-11 DIAGNOSIS — Z7901 Long term (current) use of anticoagulants: Secondary | ICD-10-CM | POA: Diagnosis not present

## 2021-03-11 DIAGNOSIS — Z91040 Latex allergy status: Secondary | ICD-10-CM | POA: Insufficient documentation

## 2021-03-11 DIAGNOSIS — Z79899 Other long term (current) drug therapy: Secondary | ICD-10-CM | POA: Diagnosis not present

## 2021-03-11 LAB — RESP PANEL BY RT-PCR (FLU A&B, COVID) ARPGX2
Influenza A by PCR: NEGATIVE
Influenza B by PCR: NEGATIVE
SARS Coronavirus 2 by RT PCR: POSITIVE — AB

## 2021-03-11 NOTE — ED Provider Notes (Signed)
[facility] EMERGENCY DEPT Provider Note   CSN: [id number] Arrival date & time: 03/11/21  1419     History Chief Complaint  Patient presents with   Cough    Madison White is a 87 y.o. female.  Patient c/o non prod cough in past 1.5-2 weeks. Symptoms acute onset, moderate, episodic, recurrent. Recently saw pcp for same, an on 12/16 covid/flu negative. Patient/family indicate just now completing course of antibiotic for possible bronchitis. No sore throat or trouble swallowing. No sob. No chest pain or discomfort. No swelling. No abd pain or nvd. No known covid or flu exposure. No fever.   The history is provided by the patient, medical records and a relative.  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   History of blood transfusion    History of rheumatic fever    HTN (hypertension)    hx of refusing treatment--White coat HTN and/or situational HTN (?)    Hyperlipidemia    hx of refusing treatment   Insulin resistance    A1c's excellent per old records (6.1 in 2008 and 2009)   Left bundle branch block 12/2011   Dr. Sallyanne Kuster at [facility]; ECHO and myocardial perfusion scan showed  septal and apical wall motion abnormality but she had no significant valvular disease and no ischemia.  She did have mildly decreased EF (39% on lexiscan and 50% on echo) and abnl LV relaxation.  Mild amount of PVCs.  Pt at higher risk for other conduction abnormalities, good chance of eventually  requiring a pacemaker.    LVH (left ventricular hypertrophy)    Nonischemic cardiomyopathy ([place]) 2016   LV dysfunction due to LBBB/septal dyssynchrony   Osteoarthritis    bilat knees--needs bilat TKA.  Ortho is trying steroid injections as of 10/23/15.   Presence of biventricular cardiac pacemaker 04/05/2019   Presence of permanent cardiac pacemaker 05/05/2017   Seasonal allergies    Solitary pulmonary nodule 05/2016   8 mm pleural based nodule in RLL.  Radiology recommended f/u noncontrast CT in 6-12 mo.    Patient Active Problem List   Diagnosis Date Noted   History of DVT of lower extremity left 12/27/2019   Persistent atrial fibrillation ([place]) 08/01/2019   Secondary hypercoagulable state ([place]) 08/01/2019   Presence of biventricular cardiac pacemaker 04/05/2019   History of complete heart block 05/05/2017   Renal cyst 03/01/2017   Hydronephrosis with urinary obstruction due to renal calculus 03/01/2017   Total knee replacement status, bilateral 11/06/2016   Nonischemic cardiomyopathy ([place]) 11/17/2014   Upper airway cough syndrome 02/26/2014   Constipation, chronic 02/26/2014   GERD (gastroesophageal reflux disease) 11/19/2012   Dyslipidemia 09/02/2012   Left bundle branch block 12/18/2011   HTN (hypertension), benign 05/31/2011    Past Surgical History:  Procedure Laterality Date   [place]   At the time of her last C/S; says she had partial bladder resection at that time as well  BI-VENTRICULAR PACEMAKER INSERTION (CRT-P)  05/05/2017   BIV PACEMAKER INSERTION CRT-P N/A 05/05/2017   Procedure: BIV PACEMAKER INSERTION CRT-P;  Surgeon: Evans Lance, MD;  Location: [facility];  Service: Cardiovascular;  Laterality: N/A;   CARDIOVASCULAR STRESS TEST  08/2005 & 12/2011   Low risk scans (on the 12/2011 scan she did have EF 39% with moderately severe LV dysfunction with septal dyssynergy probably contributed by LBBB   CARDIOVERSION N/A 08/09/2019    Procedure: CARDIOVERSION;  Surgeon: Pixie Casino, MD;  Location: [facility] ENDOSCOPY;  Service: Cardiovascular;  Laterality: N/A;   Carotid dopplers  11/22/14; 11/30/16   NORMAL 2016 and 2018   CATARACT EXTRACTION  08/14/11   both   CESAREAN SECTION  X 5   One of her neonates died soon after birth   CHOLECYSTECTOMY     1980's   COLONOSCOPY WITH PROPOFOL N/A 09/02/2017   Procedure: COLONOSCOPY WITH PROPOFOL;  Surgeon: Ladene Artist, MD;  Location: [facility] ENDOSCOPY;  Service: Endoscopy;  Laterality: N/A;   HOT HEMOSTASIS N/A 09/02/2017   Procedure: HOT HEMOSTASIS (ARGON PLASMA COAGULATION/BICAP);  Surgeon: Ladene Artist, MD;  Location: [facility] ENDOSCOPY;  Service: Endoscopy;  Laterality: N/A;   INSERT / REPLACE / REMOVE PACEMAKER     JOINT REPLACEMENT Bilateral    knee   right knee surgery Right 05/2016   ? R TKA: no records.   STRABISMUS SURGERY  1939   TONSILLECTOMY  1938   TRANSTHORACIC ECHOCARDIOGRAM  01/13/12   Septal dyssynergy, EF 50%, LV relaxation impaired.  No significant valvular abnormalities.     OB History   No obstetric history on file.     Family History  Problem Relation Age of Onset   Hypertension Mother    Heart disease Mother    Hyperlipidemia Mother    Diabetes Mother    Heart disease Father    Cancer Father     Social History   Tobacco Use   Smoking status: Former    Types: Cigarettes    Quit date: 05/31/1958    Years since quitting: 62.8   Smokeless tobacco: Never  Vaping Use   Vaping Use: Never used  Substance Use Topics   Alcohol use: Not Currently    Alcohol/week: 1.0 standard drink    Types: 1 Glasses of wine per week   Drug use: No    Home Medications Prior to Admission medications   Medication Sig Start Date End Date Taking? Authorizing Provider  acetaminophen (TYLENOL) 500 MG tablet Take by mouth. 09/24/16   [provider]  amLODipine (NORVASC) 5 MG tablet TAKE 1 TABLET BY MOUTH EVERY DAY 06/11/20   Hilty, Nadean Corwin, MD  apixaban  (ELIQUIS) 5 MG TABS tablet Take 1 tablet (5 mg total) by mouth 2 (two) times daily. 12/30/19   Croitoru, Mihai, MD  azithromycin (ZITHROMAX) 250 MG tablet Take 2 tabs today, then 1 tab daily for 4 days 03/08/21   Leamon Arnt, MD  calcium-vitamin D (OSCAL WITH D) 500-200 MG-UNIT tablet Take 1 tablet by mouth.    [provider]  Cholecalciferol 25 MCG (1000 UT) tablet Take by mouth.    [provider]  cyanocobalamin 100 MCG tablet Take by mouth.    [provider]  Iron-Vitamin C 100-250 MG TABS Take by mouth.    [provider]  metoprolol tartrate (LOPRESSOR) 25 MG tablet TAKE 1 TABLET BY MOUTH TWICE A DAY 11/27/20   Leamon Arnt, MD  mirabegron ER (  MYRBETRIQ) 25 MG TB24 tablet Take 1 tablet (25 mg total) by mouth daily. 10/05/20   Leamon Arnt, MD  omeprazole (PRILOSEC) 20 MG capsule TAKE 1 CAPSULE (20 MG TOTAL) BY MOUTH DAILY. PLEASE SCHEDULE FOLLOW UP APPT WITH DR. Jonni Sanger FOR FURTHER REFILLS 05/01/20   Leamon Arnt, MD  PEDIATRIC MULTIVITAMINS-IRON PO Take by mouth.    [provider]    Allergies    Penicillins, Nickel, Avapro [irbesartan], Lisinopril, Simvastatin, Latex, Sulfa antibiotics, and Sulfamethoxazole  Review of Systems   Review of Systems  Constitutional:  Negative for fever.  HENT:  Negative for sore throat.   Eyes:  Negative for redness.  Respiratory:  Positive for cough. Negative for shortness of breath.   Cardiovascular:  Negative for chest pain.  Gastrointestinal:  Negative for abdominal pain and vomiting.  Genitourinary:  Negative for dysuria.  Musculoskeletal:  Negative for neck pain and neck stiffness.  Skin:  Negative for rash.  Neurological:  Negative for headaches.  Hematological:  Does not bruise/bleed easily.  Psychiatric/Behavioral:  Negative for confusion.    Physical Exam Updated Vital Signs BP (!) 152/70 (BP Location: Right Arm)    Pulse 70    Temp 98.2 F (36.8 C) (Oral)    Resp (!) 22    SpO2 97%    Physical Exam Vitals and nursing note reviewed.  Constitutional:      Appearance: Normal appearance. She is well-developed.  HENT:     Head: Atraumatic.     Nose: Nose normal.     Mouth/Throat:     Mouth: Mucous membranes are moist.     Pharynx: Oropharynx is clear. No oropharyngeal exudate or posterior oropharyngeal erythema.  Eyes:     General: No scleral icterus.    Conjunctiva/sclera: Conjunctivae normal.  Neck:     Trachea: No tracheal deviation.  Cardiovascular:     Rate and Rhythm: Normal rate and regular rhythm.     Pulses: Normal pulses.     Heart sounds: Normal heart sounds. No murmur heard.   No friction rub. No gallop.  Pulmonary:     Effort: Pulmonary effort is normal. No respiratory distress.     Breath sounds: Normal breath sounds.  Abdominal:     General: Bowel sounds are normal. There is no distension.     Palpations: Abdomen is soft.     Tenderness: There is no abdominal tenderness.  Genitourinary:    Comments: No cva tenderness.  Musculoskeletal:        General: No swelling.     Cervical back: Normal range of motion and neck supple. No rigidity. No muscular tenderness.     Right lower leg: No edema.     Left lower leg: No edema.  Skin:    General: Skin is warm and dry.     Findings: No rash.  Neurological:     Mental Status: She is alert.     Comments: Alert, speech normal.   Psychiatric:        Mood and Affect: Mood normal.    ED Results / Procedures / Treatments   Labs (all labs ordered are listed, but only abnormal results are displayed) Labs Reviewed  RESP PANEL BY RT-PCR (FLU A&B, COVID) [id number]    EKG None  Radiology DG Chest 2 View  Result Date: 03/11/2021 CLINICAL DATA:  Cough for 10 days EXAM: CHEST - 2 VIEW COMPARISON:  05/06/2017 FINDINGS: LEFT subclavian sequential pacemaker with leads projecting at RIGHT atrium, RIGHT ventricle, and coronary  sinus. Borderline enlargement of cardiac silhouette. Mediastinal contours and  pulmonary vascularity normal. Atherosclerotic calcification aorta. Mild chronic bronchitic changes with minimal subsegmental atelectasis in RIGHT middle lobe. No acute infiltrate, pleural effusion, or pneumothorax. Bones demineralized. IMPRESSION: Bronchitic changes with minimal subsegmental atelectasis at RIGHT lung base. Aortic Atherosclerosis (ICD10-I70.0). Electronically Signed   By: Lavonia Dana M.D.   On: 03/11/2021 15:03    Procedures Procedures   Medications Ordered in ED Medications - No data to display  ED Course  I have reviewed the triage vital signs and the nursing notes.  Pertinent labs & imaging results that were available during my care of the patient were reviewed by me and considered in my medical decision making (see chart for details).    MDM Rules/Calculators/A&P                         Labs and imaging ordered.   Reviewed nursing notes and prior charts for additional history.   CXR reviewed/interpreted by me - no pna.   Pt currently is breathing comfortably on room air, o2 sats 98%.   Pt appears stable for d/c.   Return precautions provided.      Final Clinical Impression(s) / ED Diagnoses Final diagnoses:  None    Rx / DC Orders ED Discharge Orders     None        Lajean Saver, MD 03/11/21 1630

## 2021-03-11 NOTE — Discharge Instructions (Addendum)
It was our pleasure to provide your ER care today - we hope that you feel better.  Drink plenty of fluids/stay well hydrated.   On your chest xray , no pneumonia was noted. Your flu/covid test should be resulted in the next 1-2 hours - you may check MyChart or call for results.   May take over the counter cold/flu medication for cough and symptom relief.  Follow up with primary care doctor in 1 week if symptoms fail to improve/resolve.  Return to ER if worse, new symptoms, increased trouble breathing, new/severe pain, chest pain, or other concern.

## 2021-03-11 NOTE — ED Triage Notes (Signed)
Patient reports to the ER for chest x-ray to get a chest x-ray. Patient has been sick 10-12 days and she has been doing robitussin and mucinex as well as a z-pack and has not been getting better.

## 2021-03-18 ENCOUNTER — Other Ambulatory Visit: Payer: Self-pay | Admitting: Medical

## 2021-03-19 ENCOUNTER — Other Ambulatory Visit: Payer: Self-pay

## 2021-03-19 ENCOUNTER — Ambulatory Visit (INDEPENDENT_AMBULATORY_CARE_PROVIDER_SITE_OTHER): Payer: Medicare HMO | Admitting: Family Medicine

## 2021-03-19 ENCOUNTER — Encounter: Payer: Self-pay | Admitting: Family Medicine

## 2021-03-19 VITALS — BP 128/79 | HR 70 | Temp 97.7°F | Ht 60.0 in | Wt 156.2 lb

## 2021-03-19 DIAGNOSIS — Z0000 Encounter for general adult medical examination without abnormal findings: Secondary | ICD-10-CM | POA: Diagnosis not present

## 2021-03-19 DIAGNOSIS — I10 Essential (primary) hypertension: Secondary | ICD-10-CM

## 2021-03-19 DIAGNOSIS — F01B18 Vascular dementia, moderate, with other behavioral disturbance: Secondary | ICD-10-CM

## 2021-03-19 DIAGNOSIS — I428 Other cardiomyopathies: Secondary | ICD-10-CM | POA: Diagnosis not present

## 2021-03-19 DIAGNOSIS — K219 Gastro-esophageal reflux disease without esophagitis: Secondary | ICD-10-CM

## 2021-03-19 DIAGNOSIS — Z950 Presence of cardiac pacemaker: Secondary | ICD-10-CM

## 2021-03-19 DIAGNOSIS — N3281 Overactive bladder: Secondary | ICD-10-CM | POA: Insufficient documentation

## 2021-03-19 DIAGNOSIS — R058 Other specified cough: Secondary | ICD-10-CM

## 2021-03-19 DIAGNOSIS — R69 Illness, unspecified: Secondary | ICD-10-CM | POA: Diagnosis not present

## 2021-03-19 DIAGNOSIS — Z23 Encounter for immunization: Secondary | ICD-10-CM

## 2021-03-19 DIAGNOSIS — I4819 Other persistent atrial fibrillation: Secondary | ICD-10-CM

## 2021-03-19 MED ORDER — DONEPEZIL HCL 10 MG PO TABS: 10.0000 mg | ORAL_TABLET | Freq: Every day | ORAL | 3 refills | Status: DC

## 2021-03-19 MED ORDER — SHINGRIX 50 MCG/0.5ML IM SUSR: 0.5000 mL | Freq: Once | INTRAMUSCULAR | 0 refills | Status: AC

## 2021-03-19 NOTE — Patient Instructions (Addendum)
Please return in 3 months to recheck memory.  We are starting a new medication today to help with memory.  Today you were given the flu vaccine.  Please take the prescription for Shingrix to the pharmacy so they may administer the vaccinations. Your insurance will then cover the injections.   I will release your lab results to you on your MyChart account with further instructions. Please reply with any questions.    If you have any questions or concerns, please don't hesitate to send me a message via MyChart or call the office at [phone]. Thank you for visiting with [place] today! It's our pleasure caring for you.   Dementia Dementia is a condition that affects the way the brain functions. It often affects memory and thinking. Usually, dementia gets worse with time and cannot be reversed (progressive dementia). There are many types of dementia, including: Alzheimer's disease. This type is the most common. Vascular dementia. This type may happen as the result of a stroke. Lewy body dementia. This type may happen to people who have Parkinson's disease. Frontotemporal dementia. This type is caused by damage to nerve cells (neurons) in certain parts of the brain. Some people may be affected by more than one type of dementia. This is called mixed dementia. What are the causes? Dementia is caused by damage to cells in the brain. The area of the brain and the types of cells damaged determine the type of dementia. Usually, this damage is irreversible or cannot be undone. Some examples of irreversible causes include: Conditions that affect the blood vessels of the brain, such as diabetes, heart disease, or blood vessel disease. Genetic mutations. In some cases, changes in the brain may be caused by another condition and can be reversed or slowed. Some examples of reversible causes include: Injury to the brain. Certain medicines. Infection, such as meningitis. Metabolic problems, such as vitamin B12  deficiency or thyroid disease. Pressure on the brain, such as from a tumor, blood clot, or too much fluid in the brain (hydrocephalus). Autoimmune diseases that affect the brain or arteries, such as limbic encephalitis or vasculitis. What are the signs or symptoms? Symptoms of dementia depend on the type of dementia. Common signs of dementia include problems with remembering, thinking, problem solving, decision making, and communicating. These signs develop slowly or get worse with time. This may include: Problems remembering events or people. Having trouble taking a bath or putting clothes on. Forgetting appointments or forgetting to pay bills. Difficulty planning and preparing meals. Having trouble speaking. Getting lost easily. Changes in behavior or mood. How is this diagnosed? This condition is diagnosed by a specialist (neurologist). It is diagnosed based on the history of your symptoms, your medical history, a physical exam, and tests. Tests may include: Tests to evaluate brain function, such as memory tests, cognitive tests, and other tests. Lab tests, such as blood or urine tests. Imaging tests, such as a CT scan, a PET scan, or an MRI. Genetic testing. This may be done if other family members have a diagnosis of certain types of dementia. Your health care provider will talk with you and your family, friends, or caregivers about your history and symptoms. How is this treated? Treatment for this condition depends on the cause of the dementia. Progressive dementias, such as Alzheimer's disease, cannot be cured, but there may be treatments that help to manage symptoms. Treatment might involve taking medicines that may help to: Control the dementia. Slow down the progression of the dementia.  Manage symptoms. In some cases, treating the cause of your dementia can improve symptoms, reverse symptoms, or slow down how quickly your dementia becomes worse. Your health care provider can  direct you to support groups, organizations, and other health care providers who can help with decisions about your care. Follow these instructions at home: Medicines Take over-the-counter and prescription medicines only as told by your health care provider. Use a pill organizer or pill reminder to help you manage your medicines. Avoid taking medicines that can affect thinking, such as pain medicines or sleeping medicines. Lifestyle Make healthy lifestyle choices. Be physically active as told by your health care provider. Do not use any products that contain nicotine or tobacco, such as cigarettes, e-cigarettes, and chewing tobacco. If you need help quitting, ask your health care provider. Do not drink alcohol. Practice stress-management techniques when you get stressed. Spend time with other people. Make sure to get quality sleep. These tips can help you get a good night's rest: Avoid napping during the day. Keep your sleeping area dark and cool. Avoid exercising during the few hours before you go to bed. Avoid caffeine products in the evening. Eating and drinking Drink enough fluid to keep your urine pale yellow. Eat a healthy diet. General instructions  Work with your health care provider to determine what you need help with and what your safety needs are. Talk with your health care provider about whether it is safe for you to drive. If you were given a bracelet that identifies you as a person with memory loss or tracks your location, make sure to wear it at all times. Work with your family to make important decisions, such as advance directives, medical power of attorney, or a living will. Keep all follow-up visits. This is important. Where to find more information Alzheimer's Association: [employer] National Institute on Aging: DVDEnthusiasts.nl World Health Organization: RoleLink.com.br Contact a health care provider if: You have any new or worsening symptoms. You have  problems with choking or swallowing. Get help right away if: You feel depressed or sad, or feel that you want to harm yourself. Your family members become concerned for your safety. If you ever feel like you may hurt yourself or others, or have thoughts about taking your own life, get help right away. Go to your nearest emergency department or: Call your local emergency services (911 in the [place] Call a suicide crisis helpline, such as the [place] at 815-165-8519 or 988 in the [place]. This is open 24 hours a day in the [place] Text the Crisis Text Line at (914)715-8623 (in the [place].). Summary Dementia is a condition that affects the way the brain functions. Dementia often affects memory and thinking. Usually, dementia gets worse with time and cannot be reversed (progressive dementia). Treatment for this condition depends on the cause of the dementia. Work with your health care provider to determine what you need help with and what your safety needs are. Your health care provider can direct you to support groups, organizations, and other health care providers who can help with decisions about your care. This information is not intended to replace advice given to you by your health care provider. Make sure you discuss any questions you have with your health care provider. Document Revised: 09/26/2020 Document Reviewed: 07/18/2019 Elsevier Patient Education  [place].

## 2021-03-19 NOTE — Progress Notes (Signed)
Subjective  Chief Complaint  Patient presents with   Annual Exam    HPI: Madison White is a 88 y.o. female who presents to [facility] today for a Female Wellness Visit. She also has the concerns and/or needs as listed above in the chief complaint. These will be addressed in addition to the Health Maintenance Visit.   Wellness Visit: annual visit with health maintenance review and exam without Pap  Health maintenance: No screening indicated given age.  Patient declined bone density screenings. Chronic disease f/u and/or acute problem visit: (deemed necessary to be done in addition to the wellness visit): Pleasant 88 year old with history of A. fib on anticoagulation, nonischemic cardiomyopathy and hypertension managed by cardiology.  All has been stable.  No bleeding, chest pain, shortness of breath.  No lower extremity edema.  She does have a biventricular cardiac pacemaker in place. She lives with her daughter.  We discussed memory.  Slow progressive memory loss over the last 9 years at least by chart review.  Patient is pleasant and not very aware of her memory problems.  Daughter reports that over the last 5 to 6 months they have worsened.  However no behavioral concerns are present.  Memory exam today shows she is alert and oriented to person and place.  She knows the month and date.  I reviewed the MRI from 2016 showing some vascular changes. She is on medicine for overactive bladder but not certain that it is helpful.  This is in part due to her memory concerns Recently had URI treated with Z-Pak.  Then came down with COVID but had mild symptoms fortunately.  Reviewed recent ER note.  No concerns now.  Assessment  1. Annual physical exam   2. Overactive bladder   3. HTN (hypertension), benign   4. Nonischemic cardiomyopathy (HCC)   5. Persistent atrial fibrillation ([place])   6. Presence of biventricular cardiac pacemaker   7. Upper airway cough syndrome    8. Gastroesophageal reflux disease, unspecified whether esophagitis present   9. 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 Immunization due      Plan  Female Wellness Visit: Age appropriate Health Maintenance and Prevention measures were discussed with patient. Included topics are cancer screening recommendations, ways to keep healthy (see AVS) including dietary and exercise recommendations, regular eye and dental care, use of seat belts, and avoidance of moderate alcohol use and tobacco use.  BMI: discussed patient's BMI and encouraged positive lifestyle modifications to help get to or maintain a target BMI. HM needs and immunizations were addressed and ordered. See below for orders. See HM and immunization section for updates.  Eligible for Shingrix.  Flu shot given today.  Counseling given. Routine labs and screening tests ordered including cmp, cbc and lipids where appropriate. Discussed recommendations regarding Vit D and calcium supplementation (see AVS)  Chronic disease management visit and/or acute problem visit: Vascular dementia: Discussed with daughter and patient goals of care.  Check lab work.  Start Aricept. Other chronic problems are well controlled.  Continue current medications for blood pressure and cardiomyopathy.  She is euvolemic.  Check lab work including renal function and lipids.  Follow up: 3 months to recheck memory on Aricept Orders Placed This Encounter  Procedures   Flu Vaccine QUAD High Dose(Fluad)   CBC with Differential/Platelet   Comprehensive metabolic panel   Lipid panel   TSH   B12 and Folate Panel   Meds ordered this  encounter  Medications   Zoster Vaccine Adjuvanted [facility]) injection    Sig: Inject 0.5 mLs into the muscle once for 1 dose. Please give 2nd dose 2-6 months after first dose    Dispense:  2 each    Refill:  0      Body mass index is 30.51 kg/m. Wt Readings from Last 3 Encounters:  03/19/21 156 lb  3.2 oz (70.9 kg)  03/08/21 156 lb 3.2 oz (70.9 kg)  10/04/20 155 lb 14.4 oz (70.7 kg)     Patient Active Problem List   Diagnosis Date Noted   History of DVT of lower extremity left 12/27/2019    Priority: High   Persistent atrial fibrillation ([place]) 08/01/2019    Priority: High   Presence of biventricular cardiac pacemaker 04/05/2019    Priority: High   History of complete heart block 05/05/2017    Priority: High   Nonischemic cardiomyopathy ([place]) 11/17/2014    Priority: High   Dyslipidemia 09/02/2012    Priority: High   HTN (hypertension), benign 05/31/2011    Priority: High   Overactive bladder 03/19/2021    Priority: Medium    Recurrent nephrolithiasis 06/17/2017    Priority: Medium    Hydronephrosis with urinary obstruction due to renal calculus 03/01/2017    Priority: Medium    Upper airway cough syndrome 02/26/2014    Priority: Medium    Constipation, chronic 02/26/2014    Priority: Medium    GERD (gastroesophageal reflux disease) 11/19/2012    Priority: Medium    Left bundle branch block 12/18/2011    Priority: Medium    Renal cyst 03/01/2017    Priority: Low   Total knee replacement status, bilateral 11/06/2016    Priority: [place] Maintenance  Topic Date Due   Zoster Vaccines- Shingrix (1 of 2) Never done   TETANUS/TDAP  09/07/2021   Pneumonia Vaccine 9+ Years old  Completed   INFLUENZA VACCINE  Completed   HPV VACCINES  Aged Out   DEXA SCAN  Discontinued   COVID-19 Vaccine  Discontinued   Immunization History  Administered Date(s) Administered   Fluad Quad(high Dose 65+) 12/17/2018, 03/19/2021   Influenza Split 02/20/2014   Influenza, High Dose Seasonal PF 12/28/2014, 01/14/2017, 12/08/2017   Influenza,inj,Quad PF,6+ Mos 02/20/2014   Influenza,inj,Quad PF,6-35 Mos 11/19/2012   Influenza-Unspecified 11/19/2012, 12/26/2015, 12/17/2018, 02/01/2020   PFIZER(Purple Top)SARS-COV-2 Vaccination 05/01/2019, 05/24/2019, 02/01/2020   Pneumococcal  Conjugate-13 12/28/2014   Pneumococcal Polysaccharide-23 05/20/2006   Tdap 09/08/2011   We updated and reviewed the patient's past history in detail and it is documented below. Allergies: Patient is allergic to penicillins, nickel, avapro [irbesartan], lisinopril, simvastatin, latex, sulfa antibiotics, and sulfamethoxazole. Past Medical History Patient  has a past medical history of Acute lower GI bleeding, Diastolic dysfunction, left ventricle, GERD (gastroesophageal reflux disease), Headache disorder (2016), History of blood transfusion, History of rheumatic fever, HTN (hypertension), Hyperlipidemia, Insulin resistance, Left bundle branch block (12/2011), LVH (left ventricular hypertrophy), Nonischemic cardiomyopathy ([place]) (2016), Osteoarthritis, Presence of biventricular cardiac pacemaker (04/05/2019), Presence of permanent cardiac pacemaker (05/05/2017), Seasonal allergies, and Solitary pulmonary nodule (05/2016). Past Surgical History Patient  has a past surgical history that includes Cesarean section (X 5); Strabismus surgery (1939); Abdominal hysterectomy (1963); Cardiovascular stress test (08/2005 & 12/2011); Cataract extraction (08/14/11); transthoracic echocardiogram (01/13/12); Carotid dopplers (11/22/14; 11/30/16); Cholecystectomy; right knee surgery (Right, 05/2016); Joint replacement (Bilateral); Tonsillectomy (1938); Bi-ventricular pacemaker insertion (crt-p) (05/05/2017); Insert / replace / remove pacemaker; BIV PACEMAKER INSERTION CRT-P (N/A, 05/05/2017); Colonoscopy with  propofol (N/A, 09/02/2017); Hot hemostasis (N/A, 09/02/2017); and Cardioversion (N/A, 08/09/2019). Family History: Patient family history includes Cancer in her father; Diabetes in her mother; Heart disease in her father and mother; Hyperlipidemia in her mother; Hypertension in her mother. Social History:  Patient  reports that she quit smoking about 62 years ago. Her smoking use included cigarettes. She has never used  smokeless tobacco. She reports that she does not currently use alcohol after a past usage of about 1.0 standard drink per week. She reports that she does not use drugs.  Review of Systems: Constitutional: negative for fever or malaise Ophthalmic: negative for photophobia, double vision or loss of vision Cardiovascular: negative for chest pain, dyspnea on exertion, or new LE swelling Respiratory: negative for SOB or persistent cough Gastrointestinal: negative for abdominal pain, change in bowel habits or melena Genitourinary: negative for dysuria or gross hematuria, no abnormal uterine bleeding or disharge Musculoskeletal: negative for new gait disturbance or muscular weakness Integumentary: negative for new or persistent rashes, no breast lumps Neurological: negative for TIA or stroke symptoms Psychiatric: negative for SI or delusions Allergic/Immunologic: negative for hives  Patient Care Team    Relationship Specialty Notifications Start End  Leamon Arnt, MD PCP - General Family Medicine  04/05/19   Croitoru, Dani Gobble, MD PCP - Cardiology Cardiology  11/15/19   Evans Lance, MD PCP - Electrophysiology Cardiology  11/15/19   Sanda Klein, MD Consulting Physician Cardiology  01/21/12   Pieter Partridge, DO Consulting Physician Neurology  12/28/14   Ninetta Lights, MD (Inactive) Consulting Physician Orthopedic Surgery  10/25/15   Gaynelle Arabian, MD Consulting Physician Orthopedic Surgery  01/07/16   Crista Luria, [place] Physician Dermatology  01/07/16   Felton Clinton  Optometry  01/07/16   [place]  Dentistry  01/07/16   Lavonna Monarch, MD Consulting Physician Dermatology  04/05/19   Myrlene Broker, MD Attending Physician Urology  04/05/19     Objective  Vitals: BP 128/79    Pulse 70    Temp 97.7 F (36.5 C) (Temporal)    Ht 5' (1.524 m)    Wt 156 lb 3.2 oz (70.9 kg)    SpO2 96%    BMI 30.51 kg/m  General:  Well developed, well nourished, no acute distress  Psych:  Alert and  orientedx2,normal mood and affect HEENT:  Normocephalic, atraumatic, non-icteric sclera,  supple neck without adenopathy, mass or thyromegaly Cardiovascular:  Normal S1, S2, RRR without gallop, rub or murmur, no edema Respiratory:  Good breath sounds bilaterally, CTAB with normal respiratory effort Gastrointestinal: normal bowel sounds, soft, non-tender, no noted masses. No HSM MSK: no deformities, contusions. Joints are without erythema or swelling.    Commons side effects, risks, benefits, and alternatives for medications and treatment plan prescribed today were discussed, and the patient expressed understanding of the given instructions. Patient is instructed to call or message via MyChart if he/she has any questions or concerns regarding our treatment plan. No barriers to understanding were identified. We discussed Red Flag symptoms and signs in detail. Patient expressed understanding regarding what to do in case of urgent or emergency type symptoms.  Medication list was reconciled, printed and provided to the patient in AVS. Patient instructions and summary information was reviewed with the patient as documented in the AVS. This note was prepared with assistance of Dragon voice recognition software. Occasional wrong-word or sound-a-like substitutions may have occurred due to the inherent limitations of voice recognition software  This visit occurred during  the SARS-CoV-2 public health emergency.  Safety protocols were in place, including screening questions prior to the visit, additional usage of staff PPE, and extensive cleaning of exam room while observing appropriate contact time as indicated for disinfecting solutions.

## 2021-03-19 NOTE — Telephone Encounter (Signed)
Eliquis 5 mg refill request received. Patient is 88 years old, weight- 70.9 kg, Crea- 0.78 on 09/07/20, Diagnosis- afib, and last seen by Vivi Martens, PA on 07/27/20. Dose is appropriate based on dosing criteria. Will send in refill to requested pharmacy.

## 2021-04-03 ENCOUNTER — Other Ambulatory Visit: Payer: Self-pay | Admitting: Family Medicine

## 2021-05-06 ENCOUNTER — Ambulatory Visit (INDEPENDENT_AMBULATORY_CARE_PROVIDER_SITE_OTHER): Payer: Medicare HMO

## 2021-05-06 DIAGNOSIS — I428 Other cardiomyopathies: Secondary | ICD-10-CM | POA: Diagnosis not present

## 2021-05-06 LAB — CUP PACEART REMOTE DEVICE CHECK
Battery Remaining Longevity: 42 mo
Battery Remaining Percentage: 47 %
Battery Voltage: 2.96 V
Brady Statistic AP VP Percent: 3.5 %
Brady Statistic AP VS Percent: 1 %
Brady Statistic AS VP Percent: 94 %
Brady Statistic AS VS Percent: 1.4 %
Brady Statistic RA Percent Paced: 9.5 %
Date Time Interrogation Session: 20230220033416
Implantable Lead Implant Date: 20190219
Implantable Lead Implant Date: 20190219
Implantable Lead Implant Date: 20190219
Implantable Lead Location: 753858
Implantable Lead Location: 753859
Implantable Lead Location: 753860
Implantable Pulse Generator Implant Date: 20190219
Lead Channel Impedance Value: 540 Ohm
Lead Channel Impedance Value: 750 Ohm
Lead Channel Impedance Value: 980 Ohm
Lead Channel Pacing Threshold Amplitude: 0.5 V
Lead Channel Pacing Threshold Amplitude: 0.75 V
Lead Channel Pacing Threshold Amplitude: 2 V
Lead Channel Pacing Threshold Pulse Width: 0.5 ms
Lead Channel Pacing Threshold Pulse Width: 0.5 ms
Lead Channel Pacing Threshold Pulse Width: 1 ms
Lead Channel Sensing Intrinsic Amplitude: 0.7 mV
Lead Channel Sensing Intrinsic Amplitude: 12 mV
Lead Channel Setting Pacing Amplitude: 2 V
Lead Channel Setting Pacing Amplitude: 2.5 V
Lead Channel Setting Pacing Amplitude: 2.5 V
Lead Channel Setting Pacing Pulse Width: 0.5 ms
Lead Channel Setting Pacing Pulse Width: 1 ms
Lead Channel Setting Sensing Sensitivity: 4 mV
Pulse Gen Model: 3262
Pulse Gen Serial Number: 8995296

## 2021-05-13 NOTE — Progress Notes (Signed)
Remote pacemaker transmission.   

## 2021-06-18 ENCOUNTER — Ambulatory Visit (INDEPENDENT_AMBULATORY_CARE_PROVIDER_SITE_OTHER): Payer: Medicare HMO | Admitting: Family Medicine

## 2021-06-18 ENCOUNTER — Encounter: Payer: Self-pay | Admitting: Family Medicine

## 2021-06-18 VITALS — BP 130/85 | HR 70 | Temp 98.2°F | Ht 60.0 in | Wt 157.2 lb

## 2021-06-18 DIAGNOSIS — R269 Unspecified abnormalities of gait and mobility: Secondary | ICD-10-CM | POA: Diagnosis not present

## 2021-06-18 DIAGNOSIS — R69 Illness, unspecified: Secondary | ICD-10-CM | POA: Diagnosis not present

## 2021-06-18 DIAGNOSIS — F0150 Vascular dementia without behavioral disturbance: Secondary | ICD-10-CM | POA: Insufficient documentation

## 2021-06-18 DIAGNOSIS — F01B18 Vascular dementia, moderate, with other behavioral disturbance: Secondary | ICD-10-CM | POA: Diagnosis not present

## 2021-06-18 NOTE — Patient Instructions (Signed)
Please return in 6 months for recheck ° °If you have any questions or concerns, please don't hesitate to send me a message via MyChart or call the office at [phone]. Thank you for visiting with us today! It's our pleasure caring for you.  °

## 2021-06-18 NOTE — Progress Notes (Signed)
? ?Subjective  ?CC:  ?Chief Complaint  ?Patient presents with  ? Memory Loss  ?  Aricept is effective.  ? ? ?HPI: Madison White is a 88 y.o. female who presents to the office today to address the problems listed above in the chief complaint. ?88 year old here for follow-up on memory: Patient with history of dementia.  Tolerating Aricept 10 mg nightly.  Daughter reports that memory seems to be improving.  Less confusion.  Better memory of daily activities.  No adverse effects. ?Chart reviewed shows history of multiple memory concerns. ? ?Mini-Mental status today: 28 out of 30.  Much improved from prior questioning. ? ? ?Assessment  ?1. 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 Gait abnormality   ? ?  ?Plan  ?Moderate vascular dementia: Doing better on Aricept.  Continue 10 mg nightly.  Monitor.  Patient supervised well in the home.  She is safe. ?History of falls: Discussed balance exercises and training.  She is now using a walker ambulate. ? ?Follow up:  6 months to recheck ?Visit date not found ? ?No orders of the defined types were placed in this encounter. ? ?No orders of the defined types were placed in this encounter. ? ?  ? ?I reviewed the patients updated PMH, FH, and SocHx.  ?  ?Patient Active Problem List  ? Diagnosis Date Noted  ? History of DVT of lower extremity left 12/27/2019  ?  Priority: High  ? Persistent atrial fibrillation ([place]) 08/01/2019  ?  Priority: High  ? Presence of biventricular cardiac pacemaker 04/05/2019  ?  Priority: High  ? History of complete heart block 05/05/2017  ?  Priority: High  ? Nonischemic cardiomyopathy ([place]) 11/17/2014  ?  Priority: High  ? Dyslipidemia 09/02/2012  ?  Priority: High  ? HTN (hypertension), benign 05/31/2011  ?  Priority: High  ? Overactive bladder 03/19/2021  ?  Priority: Medium   ? Recurrent nephrolithiasis 06/17/2017  ?  Priority: Medium   ? Hydronephrosis with urinary obstruction due to renal calculus 03/01/2017  ?   Priority: Medium   ? Upper airway cough syndrome 02/26/2014  ?  Priority: Medium   ? Constipation, chronic 02/26/2014  ?  Priority: Medium   ? GERD (gastroesophageal reflux disease) 11/19/2012  ?  Priority: Medium   ? Left bundle branch block 12/18/2011  ?  Priority: Medium   ? Renal cyst 03/01/2017  ?  Priority: Low  ? Total knee replacement status, bilateral 11/06/2016  ?  Priority: Low  ? Vascular dementia ([place]) 06/18/2021  ? ?Current Meds  ?Medication Sig  ? acetaminophen (TYLENOL) 500 MG tablet Take by mouth.  ? amLODipine (NORVASC) 5 MG tablet TAKE 1 TABLET BY MOUTH EVERY DAY  ? calcium-vitamin D (OSCAL WITH D) 500-200 MG-UNIT tablet Take 1 tablet by mouth.  ? Cholecalciferol 25 MCG (1000 UT) tablet Take by mouth.  ? cyanocobalamin 100 MCG tablet Take by mouth.  ? donepezil (ARICEPT) 10 MG tablet Take 1 tablet (10 mg total) by mouth at bedtime.  ? ELIQUIS 5 MG TABS tablet TAKE 1 TABLET BY MOUTH TWICE A DAY  ? Iron-Vitamin C 100-250 MG TABS Take by mouth.  ? metoprolol tartrate (LOPRESSOR) 25 MG tablet TAKE 1 TABLET BY MOUTH TWICE A DAY  ? MYRBETRIQ 25 MG TB24 tablet TAKE 1 TABLET (25 MG TOTAL) BY MOUTH DAILY.  ? omeprazole (PRILOSEC) 20 MG capsule TAKE 1 CAPSULE (20 MG TOTAL) BY MOUTH DAILY. PLEASE SCHEDULE FOLLOW UP  APPT WITH DR. Jonni Sanger FOR FURTHER REFILLS  ? PEDIATRIC MULTIVITAMINS-IRON PO Take by mouth.  ? ? ?Allergies: ?Patient is allergic to penicillins, nickel, avapro [irbesartan], lisinopril, simvastatin, latex, sulfa antibiotics, and sulfamethoxazole. ?Family History: ?Patient family history includes Cancer in her father; Diabetes in her mother; Heart disease in her father and mother; Hyperlipidemia in her mother; Hypertension in her mother. ?Social History:  ?Patient  reports that she quit smoking about 63 years ago. Her smoking use included cigarettes. She has never used smokeless tobacco. She reports that she does not currently use alcohol after a past usage of about 1.0 standard drink per week. She  reports that she does not use drugs. ? ?Review of Systems: ?Constitutional: Negative for fever malaise or anorexia ?Cardiovascular: negative for chest pain ?Respiratory: negative for SOB or persistent cough ?Gastrointestinal: negative for abdominal pain ? ?Objective  ?Vitals: BP 130/85   Pulse 70   Temp 98.2 ?F (36.8 ?C) (Temporal)   Ht 5' (1.524 m)   Wt 157 lb 3.2 oz (71.3 kg)   SpO2 95%   BMI 30.70 kg/m?  ?General: no acute distress , A&Ox3, well-groomed, appears well ?Psych: Alert and oriented to person place and time mild confusion exact month and date. ? ?Commons side effects, risks, benefits, and alternatives for medications and treatment plan prescribed today were discussed, and the patient expressed understanding of the given instructions. Patient is instructed to call or message via MyChart if he/she has any questions or concerns regarding our treatment plan. No barriers to understanding were identified. We discussed Red Flag symptoms and signs in detail. Patient expressed understanding regarding what to do in case of urgent or emergency type symptoms.  ?Medication list was reconciled, printed and provided to the patient in AVS. Patient instructions and summary information was reviewed with the patient as documented in the AVS. ?This note was prepared with assistance of Systems analyst. Occasional wrong-word or sound-a-like substitutions may have occurred due to the inherent limitations of voice recognition software ? ?This visit occurred during the SARS-CoV-2 public health emergency.  Safety protocols were in place, including screening questions prior to the visit, additional usage of staff PPE, and extensive cleaning of exam room while observing appropriate contact time as indicated for disinfecting solutions.  ? ?

## 2021-07-25 ENCOUNTER — Other Ambulatory Visit: Payer: Self-pay | Admitting: Family Medicine

## 2021-08-05 ENCOUNTER — Ambulatory Visit: Payer: Medicare HMO

## 2021-08-23 DIAGNOSIS — Z008 Encounter for other general examination: Secondary | ICD-10-CM | POA: Diagnosis not present

## 2021-08-23 DIAGNOSIS — E785 Hyperlipidemia, unspecified: Secondary | ICD-10-CM | POA: Diagnosis not present

## 2021-08-23 DIAGNOSIS — I700 Atherosclerosis of aorta: Secondary | ICD-10-CM | POA: Diagnosis not present

## 2021-08-23 DIAGNOSIS — R32 Unspecified urinary incontinence: Secondary | ICD-10-CM | POA: Diagnosis not present

## 2021-08-23 DIAGNOSIS — I951 Orthostatic hypotension: Secondary | ICD-10-CM | POA: Diagnosis not present

## 2021-08-23 DIAGNOSIS — D6869 Other thrombophilia: Secondary | ICD-10-CM | POA: Diagnosis not present

## 2021-08-23 DIAGNOSIS — Z5982 Transportation insecurity: Secondary | ICD-10-CM | POA: Diagnosis not present

## 2021-08-23 DIAGNOSIS — I4892 Unspecified atrial flutter: Secondary | ICD-10-CM | POA: Diagnosis not present

## 2021-08-23 DIAGNOSIS — R69 Illness, unspecified: Secondary | ICD-10-CM | POA: Diagnosis not present

## 2021-08-23 DIAGNOSIS — I10 Essential (primary) hypertension: Secondary | ICD-10-CM | POA: Diagnosis not present

## 2021-08-23 DIAGNOSIS — I429 Cardiomyopathy, unspecified: Secondary | ICD-10-CM | POA: Diagnosis not present

## 2021-08-23 DIAGNOSIS — K219 Gastro-esophageal reflux disease without esophagitis: Secondary | ICD-10-CM | POA: Diagnosis not present

## 2021-08-23 DIAGNOSIS — I4891 Unspecified atrial fibrillation: Secondary | ICD-10-CM | POA: Diagnosis not present

## 2021-08-27 LAB — CUP PACEART REMOTE DEVICE CHECK
Battery Remaining Longevity: 38 mo
Battery Remaining Percentage: 43 %
Battery Voltage: 2.96 V
Brady Statistic AP VP Percent: 3.5 %
Brady Statistic AP VS Percent: 1 %
Brady Statistic AS VP Percent: 94 %
Brady Statistic AS VS Percent: 1.4 %
Brady Statistic RA Percent Paced: 9.6 %
Date Time Interrogation Session: 20230522020013
Implantable Lead Implant Date: 20190219
Implantable Lead Implant Date: 20190219
Implantable Lead Implant Date: 20190219
Implantable Lead Location: 753858
Implantable Lead Location: 753859
Implantable Lead Location: 753860
Implantable Pulse Generator Implant Date: 20190219
Lead Channel Impedance Value: 510 Ohm
Lead Channel Impedance Value: 640 Ohm
Lead Channel Impedance Value: 990 Ohm
Lead Channel Pacing Threshold Amplitude: 0.5 V
Lead Channel Pacing Threshold Amplitude: 0.75 V
Lead Channel Pacing Threshold Amplitude: 2 V
Lead Channel Pacing Threshold Pulse Width: 0.5 ms
Lead Channel Pacing Threshold Pulse Width: 0.5 ms
Lead Channel Pacing Threshold Pulse Width: 1 ms
Lead Channel Sensing Intrinsic Amplitude: 0.7 mV
Lead Channel Sensing Intrinsic Amplitude: 12 mV
Lead Channel Setting Pacing Amplitude: 2 V
Lead Channel Setting Pacing Amplitude: 2.5 V
Lead Channel Setting Pacing Amplitude: 2.5 V
Lead Channel Setting Pacing Pulse Width: 0.5 ms
Lead Channel Setting Pacing Pulse Width: 1 ms
Lead Channel Setting Sensing Sensitivity: 4 mV
Pulse Gen Model: 3262
Pulse Gen Serial Number: 8995296

## 2021-10-21 ENCOUNTER — Other Ambulatory Visit: Payer: Self-pay | Admitting: Student

## 2021-10-21 DIAGNOSIS — I4819 Other persistent atrial fibrillation: Secondary | ICD-10-CM

## 2021-10-22 NOTE — Telephone Encounter (Addendum)
Eliquis '5mg'$  refill request received. Patient is 88 years old, weight-71.3kg, Crea- 0.78 on 09/07/2020 -NEEDS LABS, Diagnosis-Afib & DVT, and last seen by Oda Kilts on 07/27/2020 (primary cardiologist Dr. Gillermina Hu APPT. Dose is appropriate per dosing criteria.   Per last OV Disposition states:" Follow up with Dr. Lovena Le in 12 months. Dr. Sallyanne Kuster in 6". Will send a message to schedulers.   8/9-schedulers reached out to the pt but had to leave a message 8/10-spoke with Cyril Mourning and she stated the pt is now living with another relative and she will be calling today for an appointment if she does not call we can reach her at [place]  Pt has an appt on 11/08/2021 with Dr. Sallyanne Kuster; will send in refill and add labs to be be done and place not on MD appt.

## 2021-10-24 ENCOUNTER — Telehealth: Payer: Self-pay | Admitting: Cardiovascular Disease

## 2021-10-24 DIAGNOSIS — I4819 Other persistent atrial fibrillation: Secondary | ICD-10-CM

## 2021-10-24 MED ORDER — APIXABAN 5 MG PO TABS: 5.0000 mg | ORAL_TABLET | Freq: Two times a day (BID) | ORAL | 0 refills | Status: DC

## 2021-10-24 NOTE — Telephone Encounter (Signed)
*  STAT* If patient is at the pharmacy, call can be transferred to refill team.   1. Which medications need to be refilled? (please list name of each medication and dose if known) ELIQUIS 5 MG TABS tablet  2. Which pharmacy/location (including street and city if local pharmacy) is medication to be sent to?[id number]- [place], [place] - [address] 3. Do they need a 30 day or 90 day supply? 928 Pt made a f/u with Dr. CSallyanne Kuster8/25/23

## 2021-10-24 NOTE — Telephone Encounter (Signed)
Prescription refill request for Eliquis received. Indication: Atrial Fib Last office visit: 07/27/20  Claudina Lick PA-C Scr: 0.78 on 09/07/20 Age: 88 Weight: 70.2kg  Based on above findings Eliquis '5mg'$  twice daily is the appropriate dose.  Pt has an appt with Dr C on 11/08/21 for exam and labs.  Refill approved.

## 2021-11-04 ENCOUNTER — Ambulatory Visit (INDEPENDENT_AMBULATORY_CARE_PROVIDER_SITE_OTHER): Payer: Medicare HMO

## 2021-11-04 DIAGNOSIS — I442 Atrioventricular block, complete: Secondary | ICD-10-CM

## 2021-11-05 LAB — CUP PACEART REMOTE DEVICE CHECK
Battery Remaining Longevity: 35 mo
Battery Remaining Percentage: 40 %
Battery Voltage: 2.96 V
Brady Statistic AP VP Percent: 3.5 %
Brady Statistic AP VS Percent: 1 %
Brady Statistic AS VP Percent: 94 %
Brady Statistic AS VS Percent: 1.4 %
Brady Statistic RA Percent Paced: 9.3 %
Date Time Interrogation Session: 20230821020016
Implantable Lead Implant Date: 20190219
Implantable Lead Implant Date: 20190219
Implantable Lead Implant Date: 20190219
Implantable Lead Location: 753858
Implantable Lead Location: 753859
Implantable Lead Location: 753860
Implantable Pulse Generator Implant Date: 20190219
Lead Channel Impedance Value: 1025 Ohm
Lead Channel Impedance Value: 510 Ohm
Lead Channel Impedance Value: 640 Ohm
Lead Channel Pacing Threshold Amplitude: 0.5 V
Lead Channel Pacing Threshold Amplitude: 0.75 V
Lead Channel Pacing Threshold Amplitude: 2 V
Lead Channel Pacing Threshold Pulse Width: 0.5 ms
Lead Channel Pacing Threshold Pulse Width: 0.5 ms
Lead Channel Pacing Threshold Pulse Width: 1 ms
Lead Channel Sensing Intrinsic Amplitude: 0.7 mV
Lead Channel Sensing Intrinsic Amplitude: 12 mV
Lead Channel Setting Pacing Amplitude: 2 V
Lead Channel Setting Pacing Amplitude: 2.5 V
Lead Channel Setting Pacing Amplitude: 2.5 V
Lead Channel Setting Pacing Pulse Width: 0.5 ms
Lead Channel Setting Pacing Pulse Width: 1 ms
Lead Channel Setting Sensing Sensitivity: 4 mV
Pulse Gen Model: 3262
Pulse Gen Serial Number: 8995296

## 2021-11-08 ENCOUNTER — Encounter: Payer: Self-pay | Admitting: Cardiovascular Disease

## 2021-11-08 ENCOUNTER — Ambulatory Visit (INDEPENDENT_AMBULATORY_CARE_PROVIDER_SITE_OTHER): Payer: Medicare HMO | Admitting: Cardiovascular Disease

## 2021-11-08 VITALS — BP 112/56 | HR 70 | Ht 61.0 in | Wt 156.6 lb

## 2021-11-08 DIAGNOSIS — R0602 Shortness of breath: Secondary | ICD-10-CM | POA: Diagnosis not present

## 2021-11-08 DIAGNOSIS — I10 Essential (primary) hypertension: Secondary | ICD-10-CM | POA: Diagnosis not present

## 2021-11-08 DIAGNOSIS — I442 Atrioventricular block, complete: Secondary | ICD-10-CM | POA: Diagnosis not present

## 2021-11-08 DIAGNOSIS — E7800 Pure hypercholesterolemia, unspecified: Secondary | ICD-10-CM

## 2021-11-08 DIAGNOSIS — D6869 Other thrombophilia: Secondary | ICD-10-CM | POA: Diagnosis not present

## 2021-11-08 DIAGNOSIS — I4819 Other persistent atrial fibrillation: Secondary | ICD-10-CM | POA: Diagnosis not present

## 2021-11-08 DIAGNOSIS — I428 Other cardiomyopathies: Secondary | ICD-10-CM | POA: Diagnosis not present

## 2021-11-08 DIAGNOSIS — Z45018 Encounter for adjustment and management of other part of cardiac pacemaker: Secondary | ICD-10-CM

## 2021-11-08 NOTE — Progress Notes (Signed)
Cardiology Office Note    Date:  11/08/2021   ID:  Madison White Sep 25, 1928, MRN [id number]  PCP:  Madison Arnt, MD  Cardiologist:   Madison Klein, MD   Chief Complaint  Patient presents with   Atrial Fibrillation    History of Present Illness:  Madison White is a 88 y.o. female with mild nonischemic cardiomyopathy, atrial fibrillation, hypertension (possibly "white-coat") and mild hyperlipidemia s/p implantation of a CRT-P device in February 2019 for complete heart block (Madison White).    This is her first appointment with me since October 2019.  She last saw Madison White in the EP clinic in August 2021.  Her pacemaker downloads are monitored by Madison White.  She generally feels very well.  She does complain of fatigue and some breathlessness, NYHA functional class II.  She gets short of breath walking across a parking lot, but not with personal care activities.  She does not have lower extremity edema, orthopnea or PND.  She does not have chest pain at rest or with activity.  She has not experienced syncope and denies palpitations.  She has not had any falls serious injuries or bleeding problems and is compliant with Eliquis anticoagulation.  Her most recent hemoglobin was 15.2 roughly a year ago.  She has never been aware of the atrial fibrillation.  She had no desire to take antiarrhythmics or have cardioversions in the past and so A-fib alerts were turned off at her appointment in August 2021.  Interrogation of her device today shows that she had progressively increasing burden of atrial fibrillation over the last 2 years and has been in uninterrupted atrial fibrillation for well over a year, since July 2022.  Despite this, her biventricular device continues to report excellent biventricular pacing at 98% or greater.  Pacemaker interrogation shows that the coronary sinus lead pacing threshold has increased and now exceeds the programmed parameters.  LV  pacing threshold is now 3.0 V at 1.0 ms and I increased the pacing output to 3.5 V at 1.0 ms.  This decreased the estimated battery longevity from 2.9 years down to 2.1 years.  Since she has been in atrial fibrillation for over a year, I also changed the device pacing mode to VVIR with a lower rate of 70 bpm and average "out-of-the-box" sensor settings.  At her last cardiology appointment in 2021 she was not taking amiodarone.  This medicine was added to her list a few months ago, but the patient does not recognize it and her daughter does not think she is taking it.  Madison White's short-term memory has clearly deteriorated.  She often looks to her daughter for answers when asked a question.  Despite this, she remains very interactive and appears to experience  She does have a history of GI bleeding, which she describes as mild hematochezia.  She underwent colonoscopy June 2019.  This study showed diverticulosis and she had few angiodysplastic lesions in the cecum, 1 of them measuring 8 mm, treated w argon laser.  The lowest hemoglobin around that time was 11.0 with normocytic indices.  She has not had any hematochezia since then.  Previously evaluated left ventricular ejection fraction is approximately 50% (by echo 2013, nuclear scintigraphy showed EF of 39%). She did not have evidence of perfusion abnormalities on nuclear imaging and does not have angina pectoris. Carotid duplex performed September 2016 did not show significant plaque.    Past Medical History:  Diagnosis Date  Acute lower GI bleeding    Diastolic dysfunction, left ventricle    GERD (gastroesophageal reflux disease)    Headache disorder 2016   R frontoparietal pain, episodic (paroxysmal hemicrania vs trig neuralgia of ophth br of CN V)--MRI brain 11/2014 showed age related changes but no explanation for her HA's.  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   History of blood transfusion    History of  rheumatic fever    HTN (hypertension)    hx of refusing treatment--White coat HTN and/or situational HTN (?)    Hyperlipidemia    hx of refusing treatment   Insulin resistance    A1c's excellent per old records (6.1 in 2008 and 2009)   Left bundle branch block 12/2011   Dr. Sallyanne White at [facility]; ECHO and myocardial perfusion scan showed  septal and apical wall motion abnormality but she had no significant valvular disease and no ischemia.  She did have mildly decreased EF (39% on lexiscan and 50% on echo) and abnl LV relaxation.  Mild amount of PVCs.  Pt at higher risk for other conduction abnormalities, good chance of eventually requiring a pacemaker.    LVH (left ventricular hypertrophy)    Nonischemic cardiomyopathy ([place]) 2016   LV dysfunction due to LBBB/septal dyssynchrony   Osteoarthritis    bilat knees--needs bilat TKA.  Ortho is trying steroid injections as of 10/23/15.   Presence of biventricular cardiac pacemaker 04/05/2019   Presence of permanent cardiac pacemaker 05/05/2017   Seasonal allergies    Solitary pulmonary nodule 05/2016   8 mm pleural based nodule in RLL.  Radiology recommended f/u noncontrast CT in 6-12 mo.    Past Surgical History:  Procedure Laterality Date   ABDOMINAL HYSTERECTOMY  1963   At the time of her last C/S; says she had partial bladder resection at that time as well   BI-VENTRICULAR PACEMAKER INSERTION (CRT-P)  05/05/2017   BIV PACEMAKER INSERTION CRT-P N/A 05/05/2017   Procedure: BIV PACEMAKER INSERTION CRT-P;  Surgeon: Madison Lance, MD;  Location: [facility];  Service: Cardiovascular;  Laterality: N/A;   CARDIOVASCULAR STRESS TEST  08/2005 & 12/2011   Low risk scans (on the 12/2011 scan she did have EF 39% with moderately severe LV dysfunction with septal dyssynergy probably contributed by LBBB   CARDIOVERSION N/A 08/09/2019   Procedure: CARDIOVERSION;  Surgeon: Madison Casino, MD;  Location: [facility] ENDOSCOPY;  Service: Cardiovascular;   Laterality: N/A;   Carotid dopplers  11/22/14; 11/30/16   NORMAL 2016 and 2018   CATARACT EXTRACTION  08/14/11   both   CESAREAN SECTION  X 5   One of her neonates died soon after birth   CHOLECYSTECTOMY     1980's   COLONOSCOPY WITH PROPOFOL N/A 09/02/2017   Procedure: COLONOSCOPY WITH PROPOFOL;  Surgeon: Ladene Artist, MD;  Location: [facility] ENDOSCOPY;  Service: Endoscopy;  Laterality: N/A;   HOT HEMOSTASIS N/A 09/02/2017   Procedure: HOT HEMOSTASIS (ARGON PLASMA COAGULATION/BICAP);  Surgeon: Ladene Artist, MD;  Location: [facility] ENDOSCOPY;  Service: Endoscopy;  Laterality: N/A;   INSERT / REPLACE / REMOVE PACEMAKER     JOINT REPLACEMENT Bilateral    knee   right knee surgery Right 05/2016   ? R TKA: no records.   STRABISMUS SURGERY  1939   TONSILLECTOMY  1938   TRANSTHORACIC ECHOCARDIOGRAM  01/13/12   Septal dyssynergy, EF 50%, LV relaxation impaired.  No significant valvular abnormalities.    Current  Medications: Outpatient Medications Prior to Visit  Medication Sig Dispense Refill   amLODipine (NORVASC) 5 MG tablet TAKE 1 TABLET BY MOUTH EVERY DAY 90 tablet 3   apixaban (ELIQUIS) 5 MG TABS tablet Take 1 tablet (5 mg total) by mouth 2 (two) times daily. 180 tablet 0   Cholecalciferol 25 MCG (1000 UT) tablet Take 1,000 Units by mouth daily.     cyanocobalamin 100 MCG tablet Take by mouth daily.     donepezil (ARICEPT) 10 MG tablet Take 1 tablet (10 mg total) by mouth at bedtime. 90 tablet 3   ELDERBERRY PO Take 1 tablet by mouth daily in the afternoon. Chew Gummie     Iron-Vitamin C 100-250 MG TABS Take by mouth.     metoprolol tartrate (LOPRESSOR) 25 MG tablet TAKE 1 TABLET BY MOUTH TWICE A DAY 180 tablet 3   MYRBETRIQ 25 MG TB24 tablet TAKE 1 TABLET (25 MG TOTAL) BY MOUTH DAILY. 30 tablet 5   omeprazole (PRILOSEC) 20 MG capsule TAKE 1 CAPSULE (20 MG TOTAL) BY MOUTH DAILY. PLEASE SCHEDULE FOLLOW UP APPT WITH DR. Jonni Sanger FOR FURTHER REFILLS 30 capsule 0   PEDIATRIC MULTIVITAMINS-IRON PO  Take 1 tablet by mouth daily in the afternoon. Chew gummie     acetaminophen (TYLENOL) 500 MG tablet Take by mouth. (Patient not taking: Reported on 11/08/2021)     calcium-vitamin D (OSCAL WITH D) 500-200 MG-UNIT tablet Take 1 tablet by mouth. (Patient not taking: Reported on 11/08/2021)     AMIODARONE HCL PO Take by mouth.     No facility-administered medications prior to visit.     Allergies:   Penicillins, Nickel, Avapro [irbesartan], Lisinopril, Simvastatin, Latex, Sulfa antibiotics, and Sulfamethoxazole   Social History   Socioeconomic History   Marital status: Widowed    Spouse name: Not on file   Number of children: Not on file   Years of education: Not on file   Highest education level: Not on file  Occupational History   Occupation: retired   Tobacco Use   Smoking status: Former    Types: Cigarettes    Quit date: 05/31/1958    Years since quitting: 63.4   Smokeless tobacco: Never  Vaping Use   Vaping Use: Never used  Substance and Sexual Activity   Alcohol use: Not Currently    Alcohol/week: 1.0 standard drink of alcohol    Types: 1 Glasses of wine per week   Drug use: No   Sexual activity: Not on file  Other Topics Concern   Not on file  Social History Narrative   Widower, husband died around 85 (wrongful death per pt's report).   Originally from [place], has been in [place] since about 1997.   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   Distant hx of tobacco abuse.  No alcohol or drugs.   Social Determinants of Health   Financial Resource Strain: Low Risk  (02/27/2020)   Overall Financial Resource Strain (CARDIA)    Difficulty of Paying Living Expenses: Not hard at all  Food Insecurity: No Food Insecurity (02/27/2020)   Hunger Vital Sign    Worried About Running Out of Food in the Last Year: Never true    Ran Out of Food in the Last Year: Never true  Transportation Needs: No Transportation Needs  (02/27/2020)   PRAPARE - Hydrologist (Medical): No    Lack of Transportation (Non-Medical): No  Physical Activity: Inactive (  02/27/2020)   Exercise Vital Sign    Days of Exercise per Week: 0 days    Minutes of Exercise per Session: 0 min  Stress: No Stress Concern Present (02/27/2020)   [place]    Feeling of Stress : Not at all  Social Connections: Moderately Isolated (02/27/2020)   Social Connection and Isolation Panel [NHANES]    Frequency of Communication with Friends and Family: More than three times a week    Frequency of Social Gatherings with Friends and Family: More than three times a week    Attends Religious Services: More than 4 times per year    Active Member of [employer] or Organizations: No    Attends Archivist Meetings: Never    Marital Status: Widowed     Family History:  The patient's family history includes Cancer in her father; Diabetes in her mother; Heart disease in her father and mother; Hyperlipidemia in her mother; Hypertension in her mother.   ROS:   Please see the history of present illness.    ROS all other systems are reviewed and are negative  PHYSICAL EXAM:   VS:  BP (!) 112/56 (BP Location: Left Arm, Patient Position: Sitting, Cuff Size: Normal)   Pulse 70   Ht '5\' 1"'$  (1.549 m)   Wt 156 lb 9.6 oz (71 kg)   SpO2 95%   BMI 29.59 kg/m      General: Alert, oriented x3, no distress, healthy left subclavian pacemaker site.  The right leg Head: no evidence of trauma, PERRL, EOMI, no exophtalmos or lid lag, no myxedema, no xanthelasma; normal ears, nose and oropharynx Neck: normal jugular venous pulsations and no hepatojugular reflux; brisk carotid pulses without delay and no carotid bruits Chest: clear to auscultation, no signs of consolidation by percussion or palpation, normal fremitus, symmetrical and full respiratory excursions Cardiovascular:  normal position and quality of the apical impulse, regular rhythm, normal first and second heart sounds, no murmurs, rubs or gallops Abdomen: no tenderness or distention, no masses by palpation, no abnormal pulsatility or arterial bruits, normal bowel sounds, no hepatosplenomegaly Extremities: no clubbing, cyanosis or edema; 2+ radial, ulnar and brachial pulses bilaterally; 2+ right femoral, posterior tibial and dorsalis pedis pulses; 2+ left femoral, posterior tibial and dorsalis pedis pulses; no subclavian or femoral bruits Neurological: grossly nonfocal Psych: Normal mood and affect    Wt Readings from Last 3 Encounters:  11/08/21 156 lb 9.6 oz (71 kg)  06/18/21 157 lb 3.2 oz (71.3 kg)  03/19/21 156 lb 3.2 oz (70.9 kg)      Studies/Labs Reviewed:   EKG:  EKG is ordered today and personally viewed.  The background atrial mechanism suggests atrial flutter.  She has biventricular pacing with a very broad QRS at about 184 ms but with distinctly positive R waves in leads V1 and V2.  QTc 533 ms. Recent Labs: No results found for requested labs within last 365 days.   Lipid Panel    Component Value Date/Time   CHOL 205 (H) 09/07/2020 1048   TRIG 205.0 (H) 09/07/2020 1048   HDL 62.60 09/07/2020 1048   CHOLHDL 3 09/07/2020 1048   VLDL 41.0 (H) 09/07/2020 1048   LDLCALC 103 (H) 10/25/2019 0938   LDLDIRECT 127.0 09/07/2020 1048    Additional studies/ records that were reviewed today include:   Comprehensive pacemaker check was performed in the clinic today with arrhythmia log results as described above.  There is virtually  100% biventricular pacing battery and lead parameters are excellent.   ASSESSMENT:    1. Shortness of breath   2. Persistent atrial fibrillation ([place])   3. Acquired thrombophilia ([place])   4. Nonischemic cardiomyopathy ([place])   5. Heart block AV complete ([place])   6. Essential hypertension   7. Hypercholesterolemia   8. Biventricular pacemaker reprogramming   9.  CHB (complete heart block) ([facility])      PLAN:  In order of problems listed above:  Afib: Longstanding persistent arrhythmia, now going on for over 13 months.  Offered cardioversion to the patient and her daughter and they do not appear interested.  Important to make sure that she is no longer taking amiodarone, if that is the case.   Continue anticoagulation.  CRT-P reprogrammed VVIR. Anticoagulation: CHADSVasc 3 (age 27, gender, +/- HTN).  She did have mild GI bleeding in June 2021, but none since.  Normal hemoglobin and normal renal function. CMP: Need to reevaluate her left ventricular systolic function and repeat an echocardiogram.  Her cardiomyopathy seemed to be primarily related to LBBB related dyssynchrony.  Need to ensure good left ventricular lead capture.  Intermittent loss of LV capture may have something to do with her recent reduced functional capacity, although this may also be age-related.  She is actually doing fairly well for 88 year old. CHB: Pacemaker dependent. HTN: She has always denied that this diagnosis is accurate.  She is currently on amlodipine and metoprolol and her diastolic blood pressure is fairly low.  Consider stopping the amlodipine, especially if LVEF is low.  I HLP: mildly elvated LDL, no need for pharmacological therapy, absent vascular disease. Concentrate on diet and exercise. CRT-P: Although on the twelve-lead electrocardiogram performed today there appears to be good evidence of LV lead capture, direct interrogation of the lead showed an increased pacing threshold.  Increased the LV lead output to 3.5 V at 1.0 ms.  No evidence of phrenic nerve or intercostal muscle stimulation noticed while in the clinic.  Generator longevity decreased by roughly 8 months following this intervention, but still has roughly 2 years of battery life.  Also changed device pacing mode to VVIR.  Medication Adjustments/Labs and Tests Ordered: Current medicines are reviewed at length  with the patient today.  Concerns regarding medicines are outlined above.  Medication changes, Labs and Tests ordered today are listed in the Patient Instructions below. Patient Instructions  Medication Instructions:  STOP the Amiodarone *If you need a refill on your cardiac medications before your next appointment, please call your pharmacy*   Lab Work: None ordered. If you have labs (blood work) drawn today and your tests are completely normal, you will receive your results only by: [place] (if you have MyChart) OR A paper copy in the mail If you have any lab test that is abnormal or we need to change your treatment, we will call you to review the results.   Testing/Procedures: Your physician has requested that you have an echocardiogram. Echocardiography is a painless test that uses sound waves to create images of your heart. It provides your doctor with information about the size and shape of your heart and how well your heart's chambers and valves are working. You may receive an ultrasound enhancing agent through an IV if needed to better visualize your heart during the echo.This procedure takes approximately one hour. There are no restrictions for this procedure. This will take place at the [address].     Follow-Up: At  [facility], you and your health needs are our priority.  As part of our continuing mission to provide you with exceptional heart care, we have created designated Provider Care Teams.  These Care Teams include your primary Cardiologist (physician) and Advanced Practice Providers (APPs -  Physician Assistants and Nurse Practitioners) who all work together to provide you with the care you need, when you need it.  We recommend signing up for the patient portal called "MyChart".  Sign up information is provided on this After Visit Summary.  MyChart is used to connect with patients for Virtual Visits (Telemedicine).  Patients are able to view  lab/test results, encounter notes, upcoming appointments, etc.  Non-urgent messages can be sent to your provider as well.   To learn more about what you can do with MyChart, go to [email or web address].    Your next appointment:   3 month(s) device day  The format for your next appointment:   In Person  Provider:   Sanda Klein, MD {   Important Information About Sugar         Signed, Madison Klein, MD  11/08/2021 2:31 PM    [address] [place], [place], [place]  [address] Phone: [phone]; Fax: [phone]

## 2021-11-08 NOTE — Patient Instructions (Signed)
Medication Instructions:  STOP the Amiodarone *If you need a refill on your cardiac medications before your next appointment, please call your pharmacy*   Lab Work: None ordered. If you have labs (blood work) drawn today and your tests are completely normal, you will receive your results only by: [place] (if you have MyChart) OR A paper copy in the mail If you have any lab test that is abnormal or we need to change your treatment, we will call you to review the results.   Testing/Procedures: Your physician has requested that you have an echocardiogram. Echocardiography is a painless test that uses sound waves to create images of your heart. It provides your doctor with information about the size and shape of your heart and how well your heart's chambers and valves are working. You may receive an ultrasound enhancing agent through an IV if needed to better visualize your heart during the echo.This procedure takes approximately one hour. There are no restrictions for this procedure. This will take place at the [address].     Follow-Up: At [facility], you and your health needs are our priority.  As part of our continuing mission to provide you with exceptional heart care, we have created designated Provider Care Teams.  These Care Teams include your primary Cardiologist (physician) and Advanced Practice Providers (APPs -  Physician Assistants and Nurse Practitioners) who all work together to provide you with the care you need, when you need it.  We recommend signing up for the patient portal called "MyChart".  Sign up information is provided on this After Visit Summary.  MyChart is used to connect with patients for Virtual Visits (Telemedicine).  Patients are able to view lab/test results, encounter notes, upcoming appointments, etc.  Non-urgent messages can be sent to your provider as well.   To learn more about what you can do with MyChart, go to  [email or web address].    Your next appointment:   3 month(s) device day  The format for your next appointment:   In Person  Provider:   Sanda Klein, MD {   Important Information About Sugar

## 2021-11-11 ENCOUNTER — Telehealth: Payer: Self-pay

## 2021-11-11 NOTE — Telephone Encounter (Signed)
Outreach made to Pt's granddaughter Cyril Mourning per request.  Scheduler had spoken with her earlier to schedule device follow up for Pt.  She had questions regarding necessity of this.  Left detailed message advising Dr. Loletha Grayer had requested Pt see device clinic to determine if any adjustments could be made to maximize longevity of pacemaker.  Left device clinic phone number to call back.

## 2021-11-12 NOTE — Telephone Encounter (Signed)
F/u scheduled 

## 2021-11-13 ENCOUNTER — Ambulatory Visit: Payer: Medicare HMO | Attending: Cardiology

## 2021-11-13 DIAGNOSIS — I428 Other cardiomyopathies: Secondary | ICD-10-CM

## 2021-11-13 LAB — CUP PACEART INCLINIC DEVICE CHECK
Battery Remaining Longevity: 32 mo
Battery Voltage: 2.96 V
Brady Statistic RA Percent Paced: 0 %
Brady Statistic RV Percent Paced: 98 %
Date Time Interrogation Session: 20230830164254
Implantable Lead Implant Date: 20190219
Implantable Lead Implant Date: 20190219
Implantable Lead Implant Date: 20190219
Implantable Lead Location: 753858
Implantable Lead Location: 753859
Implantable Lead Location: 753860
Implantable Pulse Generator Implant Date: 20190219
Lead Channel Impedance Value: 600 Ohm
Lead Channel Impedance Value: 650 Ohm
Lead Channel Pacing Threshold Amplitude: 1.75 V
Lead Channel Pacing Threshold Amplitude: 3 V
Lead Channel Pacing Threshold Pulse Width: 1 ms
Lead Channel Pacing Threshold Pulse Width: 1 ms
Lead Channel Sensing Intrinsic Amplitude: 0.7 mV
Lead Channel Sensing Intrinsic Amplitude: 12 mV
Lead Channel Setting Pacing Amplitude: 2.25 V
Lead Channel Setting Pacing Amplitude: 2.5 V
Lead Channel Setting Pacing Pulse Width: 0.5 ms
Lead Channel Setting Pacing Pulse Width: 1 ms
Lead Channel Setting Sensing Sensitivity: 4 mV
Pulse Gen Model: 3262
Pulse Gen Serial Number: 8995296

## 2021-11-13 NOTE — Progress Notes (Signed)
Patient seen in device clinic to address LV threshold elevated.   See attachment for details. Consulted with Gaspar Bidding with [facility] for recommendations.   LV vector mid 2-proximal 4 ~ 3.0 V @ 1.0 ms. LV vector 3-can ~ no capture 2 3.0 V LV vector 4-can ~ no capture @ 4V

## 2021-11-22 ENCOUNTER — Encounter: Payer: Self-pay | Admitting: *Deleted

## 2021-11-22 ENCOUNTER — Telehealth: Payer: Self-pay | Admitting: *Deleted

## 2021-11-22 NOTE — Patient Outreach (Signed)
  Care Coordination   Initial Visit Note   11/22/2021 Name: Madison White MRN: [id number] DOB: 09-18-1928  Madison White is a 88 y.o. year old female who sees Leamon Arnt, MD for primary care. I  spoke with the DPR granddaughter Donia Guiles  What matters to the patients health and wellness today?  Eliquis assistance with medication    Goals Addressed               This Visit's Progress     COMPLETED: needs assistance with Eliquis (pt-stated)        Care Coordination Interventions: Reviewed medications with patient and discussed purpose of medications Reviewed scheduled/upcoming provider appointments including all pending appointments and requested caregiver to contact primary provider and scheduled the pt's AWV for this year (receptive). Pharmacy referral for medication assist for Eliquis Assessed social determinant of health barriers          SDOH assessments and interventions completed:  Yes  SDOH Interventions Today    Flowsheet Row Most Recent Value  SDOH Interventions   Food Insecurity Interventions Intervention Not Indicated  Housing Interventions Intervention Not Indicated  Transportation Interventions Intervention Not Indicated        Care Coordination Interventions Activated:  Yes  Care Coordination Interventions:  Yes, provided   Follow up plan: Referral made to [facility] pharmacy    Encounter Outcome:  Pt. Visit Completed   Raina Mina, RN Care Management Coordinator [place] Office [phone]

## 2021-11-22 NOTE — Patient Instructions (Signed)
Visit Information  Thank you for taking time to visit with me today. Please don't hesitate to contact me if I can be of assistance to you.   Following are the goals we discussed today:   Goals Addressed               This Visit's Progress     COMPLETED: needs assistance with Eliquis (pt-stated)        Care Coordination Interventions: Reviewed medications with patient and discussed purpose of medications Reviewed scheduled/upcoming provider appointments including all pending appointments and requested caregiver to contact primary provider and scheduled the pt's AWV for this year (receptive). Pharmacy referral for medication assist for Eliquis Assessed social determinant of health barriers           Please call the care guide team at [phone] if you need to cancel or reschedule your appointment.   If you are experiencing a Mental Health or [place] or need someone to talk to, please call the Suicide and Crisis Lifeline: 988 call the [place] National Suicide Prevention Lifeline: 636-769-9447 or TTY: 6150462012 TTY 205 325 3790) to talk to a trained counselor call 1-800-273-TALK (toll free, 24 hour hotline)  Patient verbalizes understanding of instructions and care plan provided today and agrees to view in [place]. Active MyChart status and patient understanding of how to access instructions and care plan via MyChart confirmed with patient.     No further follow up required: No further needs at this time.  Raina Mina, RN Care Management Coordinator [place] Office [phone]

## 2021-11-25 ENCOUNTER — Encounter (HOSPITAL_COMMUNITY): Payer: Self-pay

## 2021-11-25 ENCOUNTER — Ambulatory Visit (HOSPITAL_COMMUNITY): Payer: Medicare HMO | Attending: Cardiovascular Disease

## 2021-11-25 NOTE — Progress Notes (Signed)
Verified appointment "no show" status with [place] R. at 11:32.

## 2021-11-28 ENCOUNTER — Telehealth: Payer: Self-pay

## 2021-11-28 NOTE — Progress Notes (Signed)
[place] [facility])  [place] Team   Reason for referral: Medication Assistance with [facility] referral is being closed due to the following reasons:  -Patient was outreached on 11/22/21, 11/26/21, and 11/28/2021.  -Will close [place] case as I have been unable to engage or maintain contact with patient -I have provided my contact information if patient or family needs to reach out to me in the future.   [facility] is happy to assist the patient/family in the future for clinical pharmacy needs, following a discussion from your team about [place] outreach. Thank you for allowing [facility] to be a part of your patient's care.   Kristeen Miss, PharmD Clinical Pharmacist [place] Cell: [phone]

## 2021-12-02 NOTE — Progress Notes (Signed)
Remote pacemaker transmission.   

## 2021-12-04 ENCOUNTER — Other Ambulatory Visit: Payer: Self-pay | Admitting: Family Medicine

## 2021-12-05 ENCOUNTER — Ambulatory Visit (HOSPITAL_COMMUNITY): Payer: Medicare HMO | Attending: Cardiovascular Disease

## 2021-12-05 ENCOUNTER — Other Ambulatory Visit: Payer: Self-pay | Admitting: Family Medicine

## 2021-12-05 DIAGNOSIS — I429 Cardiomyopathy, unspecified: Secondary | ICD-10-CM | POA: Diagnosis not present

## 2021-12-05 DIAGNOSIS — I10 Essential (primary) hypertension: Secondary | ICD-10-CM | POA: Insufficient documentation

## 2021-12-05 DIAGNOSIS — R0600 Dyspnea, unspecified: Secondary | ICD-10-CM | POA: Diagnosis not present

## 2021-12-05 DIAGNOSIS — I447 Left bundle-branch block, unspecified: Secondary | ICD-10-CM | POA: Insufficient documentation

## 2021-12-05 DIAGNOSIS — Z950 Presence of cardiac pacemaker: Secondary | ICD-10-CM | POA: Insufficient documentation

## 2021-12-05 DIAGNOSIS — R0602 Shortness of breath: Secondary | ICD-10-CM | POA: Diagnosis not present

## 2021-12-05 DIAGNOSIS — E785 Hyperlipidemia, unspecified: Secondary | ICD-10-CM | POA: Insufficient documentation

## 2021-12-05 LAB — ECHOCARDIOGRAM COMPLETE
Area-P 1/2: 4.71 cm2
Calc EF: 54.7 %
S' Lateral: 3.4 cm
Single Plane A2C EF: 55.4 %
Single Plane A4C EF: 53.9 %

## 2021-12-09 ENCOUNTER — Encounter: Payer: Self-pay | Admitting: *Deleted

## 2021-12-11 ENCOUNTER — Telehealth: Payer: Self-pay | Admitting: Cardiovascular Disease

## 2021-12-11 NOTE — Telephone Encounter (Signed)
Sanda Klein, MD  12/06/2021  8:23 AM EDT     Heart pumping function (left ventricle) is normal. The right ventricle may be mildly weaker than normal, but I am not sure that is an accurate assessment and  even if true, do not recommend any change in the treatment plan.

## 2021-12-11 NOTE — Telephone Encounter (Signed)
Pt's granddaughter is returning call to get echo result

## 2021-12-11 NOTE — Telephone Encounter (Signed)
Spoke to granddaughter.  Result given  verbalized understanding .

## 2021-12-18 ENCOUNTER — Encounter: Payer: Self-pay | Admitting: Family Medicine

## 2021-12-18 ENCOUNTER — Ambulatory Visit (INDEPENDENT_AMBULATORY_CARE_PROVIDER_SITE_OTHER): Payer: Medicare HMO | Admitting: Family Medicine

## 2021-12-18 VITALS — BP 118/58 | HR 71 | Temp 97.9°F | Ht 61.0 in | Wt 157.2 lb

## 2021-12-18 DIAGNOSIS — N3281 Overactive bladder: Secondary | ICD-10-CM | POA: Diagnosis not present

## 2021-12-18 DIAGNOSIS — I10 Essential (primary) hypertension: Secondary | ICD-10-CM | POA: Diagnosis not present

## 2021-12-18 DIAGNOSIS — Z23 Encounter for immunization: Secondary | ICD-10-CM | POA: Diagnosis not present

## 2021-12-18 DIAGNOSIS — Z950 Presence of cardiac pacemaker: Secondary | ICD-10-CM

## 2021-12-18 DIAGNOSIS — F01B18 Vascular dementia, moderate, with other behavioral disturbance: Secondary | ICD-10-CM

## 2021-12-18 DIAGNOSIS — I442 Atrioventricular block, complete: Secondary | ICD-10-CM | POA: Diagnosis not present

## 2021-12-18 DIAGNOSIS — I4819 Other persistent atrial fibrillation: Secondary | ICD-10-CM | POA: Diagnosis not present

## 2021-12-18 DIAGNOSIS — I428 Other cardiomyopathies: Secondary | ICD-10-CM

## 2021-12-18 DIAGNOSIS — R69 Illness, unspecified: Secondary | ICD-10-CM | POA: Diagnosis not present

## 2021-12-18 DIAGNOSIS — K219 Gastro-esophageal reflux disease without esophagitis: Secondary | ICD-10-CM

## 2021-12-18 MED ORDER — MIRABEGRON ER 25 MG PO TB24: 25.0000 mg | ORAL_TABLET | Freq: Every day | ORAL | 3 refills | Status: DC

## 2021-12-18 NOTE — Patient Instructions (Addendum)
Please return in 3-6 months for your annual complete physical; please come fasting.   Stop the amlodipine and start checking the blood pressures at home intermittently to ensure they remain stable. We want the bp to be between 120/70-135/80s ideally. Please let me know if it is consistently > 140/90 with the medication change.   And Mybetriq was refilled on 09/22. See if the pharmacy has it. I have printed a new copy if needed.   If you have any questions or concerns, please don't hesitate to send me a message via MyChart or call the office at [phone]. Thank you for visiting with [place] today! It's our pleasure caring for you.   Management of Memory Problems  There are some general things you can do to help manage your memory problems.  Your memory may not in fact recover, but by using techniques and strategies you will be able to manage your memory difficulties better.  1)  Establish a routine. Try to establish and then stick to a regular routine.  By doing this, you will get used to what to expect and you will reduce the need to rely on your memory.  Also, try to do things at the same time of day, such as taking your medication or checking your calendar first thing in the morning. Think about think that you can do as a part of a regular routine and make a list.  Then enter them into a daily planner to remind you.  This will help you establish a routine.  2)  Organize your environment. Organize your environment so that it is uncluttered.  Decrease visual stimulation.  Place everyday items such as keys or cell phone in the same place every day (ie.  Basket next to front door) Use post it notes with a brief message to yourself (ie. Turn off light, lock the door) Use labels to indicate where things go (ie. Which cupboards are for food, dishes, etc.) Keep a notepad and pen by the telephone to take messages  3)  Memory Aids A diary or journal/notebook/daily planner Making a list (shopping list,  chore list, to do list that needs to be done) Using an alarm as a reminder (kitchen timer or cell phone alarm) Using cell phone to store information (Notes, Calendar, Reminders) Calendar/White board placed in a prominent position Post-it notes  In order for memory aids to be useful, you need to have good habits.  It's no good remembering to make a note in your journal if you don't remember to look in it.  Try setting aside a certain time of day to look in journal.  4)  Improving mood and managing fatigue. There may be other factors that contribute to memory difficulties.  Factors, such as anxiety, depression and tiredness can affect memory. Regular gentle exercise can help improve your mood and give you more energy. Simple relaxation techniques may help relieve symptoms of anxiety Try to get back to completing activities or hobbies you enjoyed doing in the past. Learn to pace yourself through activities to decrease fatigue. Find out about some local support groups where you can share experiences with others. Try and achieve 7-8 hours of sleep at night.

## 2021-12-18 NOTE — Progress Notes (Signed)
Subjective  CC:  Chief Complaint  Patient presents with   Dementia    HPI: Madison White is a 88 y.o. female who presents to the office today to address the problems listed above in the chief complaint. Dementia: Patient here with her daughter.  Memory continues to decline.  Taking Aricept 10 daily.  No significant behavioral disturbances.  Good supervision.  Safe at home.  She is able to tell me she is at the doctor's office today.  Uncertain of year.  No she had a recent birthday.  Relies on her daughter answered most questions.  Appears happy Hypertension: Blood pressures running low.  Patient denies lightheadedness or near syncope.  No palpitations.  She takes amlodipine 5 mg daily and metoprolol 25 twice daily Complete heart block pacemaker dependent: Reviewed recent cardiology notes.  Pacemaker adjusted.  No symptoms.  She has follow-up next month.   A-fib on anticoagulant.  Rate controlled.  No bleeds Overactive bladder on Myrbetriq.  Hard to say if it is completely helpful.  Patient does endorse getting up in the melanite to go to bathroom.  No falls GERD over-the-counter omeprazole daily.  Well-controlled  Assessment  1. 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 Essential hypertension   3. Persistent atrial fibrillation ([address])   4. Nonischemic cardiomyopathy ([place])   5. Need for immunization against influenza   6. Complete heart block ([place])   7. Presence of biventricular cardiac pacemaker   8. Overactive bladder   9. Gastroesophageal reflux disease, unspecified whether esophagitis present      Plan  Dementia: Mildly progressing.  Continue Aricept.  No behavioral disturbances. Cardiology: A-fib, heart block pacemaker dependent CHF are all stable at this time. Overactive bladder improved on Myrbetriq. GERD controlled on omeprazole Hypertension: Blood pressures are running a little bit low.  Because she is on anticoagulation, want to avoid low  blood pressures and falls.  Stop amlodipine.  Recommend home blood pressure monitoring.  Continue metoprolol 25 twice daily  Follow up: Return in about 3-6 months (around 06/19/2022) for complete physical.  Visit date not found  Orders Placed This Encounter  Procedures   Flu Vaccine QUAD High Dose(Fluad)   Meds ordered this encounter  Medications   mirabegron ER (MYRBETRIQ) 25 MG TB24 tablet    Sig: Take 1 tablet (25 mg total) by mouth daily.    Dispense:  90 tablet    Refill:  3      I reviewed the patients updated PMH, FH, and SocHx.    Patient Active Problem List   Diagnosis Date Noted   Vascular dementia ([place]) 06/18/2021    Priority: High   History of DVT of lower extremity left 12/27/2019    Priority: High   Persistent atrial fibrillation ([place]) 08/01/2019    Priority: High   Presence of biventricular cardiac pacemaker 04/05/2019    Priority: High   History of complete heart block 05/05/2017    Priority: High   Nonischemic cardiomyopathy ([place]) 11/17/2014    Priority: High   Dyslipidemia 09/02/2012    Priority: High   Essential hypertension 05/31/2011    Priority: High   Overactive bladder 03/19/2021    Priority: Medium    Recurrent nephrolithiasis 06/17/2017    Priority: Medium    Hydronephrosis with urinary obstruction due to renal calculus 03/01/2017    Priority: Medium    Upper airway cough syndrome 02/26/2014    Priority: Medium    Constipation, chronic 02/26/2014  Priority: Medium    GERD (gastroesophageal reflux disease) 11/19/2012    Priority: Medium    Left bundle branch block 12/18/2011    Priority: Medium    Renal cyst 03/01/2017    Priority: Low   Total knee replacement status, bilateral 11/06/2016    Priority: Low   Complete heart block ([facility]) 12/18/2021   Current Meds  Medication Sig   apixaban (ELIQUIS) 5 MG TABS tablet Take 1 tablet (5 mg total) by mouth 2 (two) times daily.   calcium-vitamin D (OSCAL WITH D) 500-200 MG-UNIT tablet Take 1  tablet by mouth.   Cholecalciferol 25 MCG (1000 UT) tablet Take 1,000 Units by mouth daily.   cyanocobalamin 100 MCG tablet Take by mouth daily.   donepezil (ARICEPT) 10 MG tablet Take 1 tablet (10 mg total) by mouth at bedtime.   ELDERBERRY PO Take 1 tablet by mouth daily in the afternoon. Chew Gummie   Iron-Vitamin C 100-250 MG TABS Take by mouth.   metoprolol tartrate (LOPRESSOR) 25 MG tablet TAKE 1 TABLET BY MOUTH TWICE A DAY   omeprazole (PRILOSEC) 20 MG capsule TAKE 1 CAPSULE (20 MG TOTAL) BY MOUTH DAILY. PLEASE SCHEDULE FOLLOW UP APPT WITH DR. Jonni Sanger FOR FURTHER REFILLS   PEDIATRIC MULTIVITAMINS-IRON PO Take 1 tablet by mouth daily in the afternoon. Chew gummie   [DISCONTINUED] amLODipine (NORVASC) 5 MG tablet TAKE 1 TABLET BY MOUTH EVERY DAY   [DISCONTINUED] MYRBETRIQ 25 MG TB24 tablet TAKE 1 TABLET (25 MG TOTAL) BY MOUTH DAILY.    Allergies: Patient is allergic to penicillins, nickel, avapro [irbesartan], lisinopril, simvastatin, latex, sulfa antibiotics, and sulfamethoxazole. Family History: Patient family history includes Cancer in her father; Diabetes in her mother; Heart disease in her father and mother; Hyperlipidemia in her mother; Hypertension in her mother. Social History:  Patient  reports that she quit smoking about 63 years ago. Her smoking use included cigarettes. She has never used smokeless tobacco. She reports that she does not currently use alcohol after a past usage of about 1.0 standard drink of alcohol per week. She reports that she does not use drugs.  Review of Systems: Constitutional: Negative for fever malaise or anorexia Cardiovascular: negative for chest pain Respiratory: negative for SOB or persistent cough Gastrointestinal: negative for abdominal pain  Objective  Vitals: BP (!) 118/58   Pulse 71   Temp 97.9 F (36.6 C)   Ht '5\' 1"'$  (1.549 m)   Wt 157 lb 3.2 oz (71.3 kg)   SpO2 95%   BMI 29.70 kg/m  General: no acute distress , A&Ox1, happy mood,  conversant HEENT: PEERL, conjunctiva normal, neck is supple Cardiovascular:  RRR without murmur or gallop.  Respiratory:  Good breath sounds bilaterally, CTAB with normal respiratory effort Skin:  Warm, no rashes, multiple seborrheic keratoses    Commons side effects, risks, benefits, and alternatives for medications and treatment plan prescribed today were discussed, and the patient expressed understanding of the given instructions. Patient is instructed to call or message via MyChart if he/she has any questions or concerns regarding our treatment plan. No barriers to understanding were identified. We discussed Red Flag symptoms and signs in detail. Patient expressed understanding regarding what to do in case of urgent or emergency type symptoms.  Medication list was reconciled, printed and provided to the patient in AVS. Patient instructions and summary information was reviewed with the patient as documented in the AVS. This note was prepared with assistance of Dragon voice recognition software. Occasional wrong-word or sound-a-like substitutions  may have occurred due to the inherent limitations of voice recognition software  This visit occurred during the SARS-CoV-2 public health emergency.  Safety protocols were in place, including screening questions prior to the visit, additional usage of staff PPE, and extensive cleaning of exam room while observing appropriate contact time as indicated for disinfecting solutions.

## 2021-12-19 DIAGNOSIS — M5136 Other intervertebral disc degeneration, lumbar region: Secondary | ICD-10-CM | POA: Diagnosis not present

## 2021-12-19 DIAGNOSIS — M9903 Segmental and somatic dysfunction of lumbar region: Secondary | ICD-10-CM | POA: Diagnosis not present

## 2022-01-27 ENCOUNTER — Encounter: Payer: Self-pay | Admitting: Cardiovascular Disease

## 2022-01-27 ENCOUNTER — Ambulatory Visit: Payer: Medicare HMO | Attending: Cardiovascular Disease | Admitting: Cardiovascular Disease

## 2022-01-27 VITALS — BP 130/71 | HR 71 | Ht 62.0 in | Wt 154.6 lb

## 2022-01-27 DIAGNOSIS — Z950 Presence of cardiac pacemaker: Secondary | ICD-10-CM

## 2022-01-27 DIAGNOSIS — D6869 Other thrombophilia: Secondary | ICD-10-CM

## 2022-01-27 DIAGNOSIS — I442 Atrioventricular block, complete: Secondary | ICD-10-CM | POA: Diagnosis not present

## 2022-01-27 DIAGNOSIS — I428 Other cardiomyopathies: Secondary | ICD-10-CM | POA: Diagnosis not present

## 2022-01-27 DIAGNOSIS — I10 Essential (primary) hypertension: Secondary | ICD-10-CM | POA: Diagnosis not present

## 2022-01-27 DIAGNOSIS — I4819 Other persistent atrial fibrillation: Secondary | ICD-10-CM

## 2022-01-27 DIAGNOSIS — E7800 Pure hypercholesterolemia, unspecified: Secondary | ICD-10-CM | POA: Diagnosis not present

## 2022-01-27 MED ORDER — METOPROLOL TARTRATE 25 MG PO TABS: 12.5000 mg | ORAL_TABLET | Freq: Two times a day (BID) | ORAL | 3 refills | Status: DC

## 2022-01-27 NOTE — Progress Notes (Signed)
Cardiology Office Note    Date:  01/27/2022   ID:  Madison White, DOB 1928-12-27, MRN [id number]  PCP:  Madison Arnt, MD  Cardiologist:   Madison Klein, MD   No chief complaint on file.   History of Present Illness:  Madison White is a 88 y.o. female with mild nonischemic cardiomyopathy, atrial fibrillation, hypertension (possibly "white-coat") and mild hyperlipidemia s/p implantation of a CRT-P device in February 2019 for complete heart block (Madison White, monitored by Dr. Cristopher White).  She is accompanied by her daughter, who helps fill in the gaps of the review of systems, as Madison White's memory is clearly deteriorating.  She continues to complain of easy fatigability and some shortness of breath.  She has not been exercising at all.  Her echocardiogram shows normal left ventricular systolic function (diastolic function cannot be assessed due to atrial fibrillation).  There are no significant valvular abnormalities.  She does not have orthopnea, PND or lower extremity edema and never complains of chest pain, palpitations or dizziness.  She has not had syncope.  She has not had any bleeding problems.  She has now been in persistent atrial fibrillation for well over a year and we reprogram her device to VVIR.  After this change estimated generator longevity is 2.7 years.  She has 99% biventricular pacing.  She is pacemaker dependent.  Lead parameters are acceptable.  Her heart rate histograms are blunted, as would be expected from her sedentary lifestyle.  The thoracic impedance shows only 1 very brief deviation suggesting fluid overload lasting for about 7 days in early October, spontaneously resolved.  It is now been a month since discontinuation of her amlodipine and she still has normal blood pressure.  She does have a history of GI bleeding, which she describes as mild hematochezia.  She underwent colonoscopy June 2019.  This study showed diverticulosis and she  had few angiodysplastic lesions in the cecum, 1 of them measuring 8 mm, treated w argon laser.  The lowest hemoglobin around that time was 11.0 with normocytic indices.  She has not had any hematochezia since then.  Previously evaluated left ventricular ejection fraction is approximately 50% (by echo 2013, nuclear scintigraphy showed EF of 39%). She did not have evidence of perfusion abnormalities on nuclear imaging and does not have angina pectoris. Carotid duplex performed September 2016 did not show significant plaque.    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   History of blood transfusion    History of rheumatic fever    HTN (hypertension)    hx of refusing treatment--White coat HTN and/or situational HTN (?)    Hyperlipidemia    hx of refusing treatment   Insulin resistance    A1c's excellent per old records (6.1 in 2008 and 2009)   Left bundle branch block 12/2011   Dr. Sallyanne White at [facility]; ECHO and myocardial perfusion scan showed  septal and apical wall motion abnormality but she had no significant valvular disease and no ischemia.  She did have mildly decreased EF (39% on lexiscan and 50% on echo)  and abnl LV relaxation.  Mild amount of PVCs.  Pt at higher risk for other conduction abnormalities, good chance of eventually requiring a pacemaker.    LVH (left ventricular hypertrophy)    Nonischemic cardiomyopathy ([place]) 2016   LV dysfunction due to LBBB/septal dyssynchrony   Osteoarthritis    bilat knees--needs bilat TKA.  Ortho is trying steroid injections as of 10/23/15.   Presence of biventricular cardiac  pacemaker 04/05/2019   Presence of permanent cardiac pacemaker 05/05/2017   Seasonal allergies    Solitary pulmonary nodule 05/2016   8 mm pleural based nodule in RLL.  Radiology recommended f/u noncontrast CT in 6-12 mo.    Past Surgical History:  Procedure Laterality Date   ABDOMINAL HYSTERECTOMY  1963   At the time of her last C/S; says she had partial bladder resection at that time as well   BI-VENTRICULAR PACEMAKER INSERTION (CRT-P)  05/05/2017   BIV PACEMAKER INSERTION CRT-P N/A 05/05/2017   Procedure: BIV PACEMAKER INSERTION CRT-P;  Surgeon: Madison Lance, MD;  Location: [facility];  Service: Cardiovascular;  Laterality: N/A;   CARDIOVASCULAR STRESS TEST  08/2005 & 12/2011   Low risk scans (on the 12/2011 scan she did have EF 39% with moderately severe LV dysfunction with septal dyssynergy probably contributed by LBBB   CARDIOVERSION N/A 08/09/2019   Procedure: CARDIOVERSION;  Surgeon: Madison Casino, MD;  Location: [facility] ENDOSCOPY;  Service: Cardiovascular;  Laterality: N/A;   Carotid dopplers  11/22/14; 11/30/16   NORMAL 2016 and 2018   CATARACT EXTRACTION  08/14/11   both   CESAREAN SECTION  X 5   One of her neonates died soon after birth   CHOLECYSTECTOMY     1980's   COLONOSCOPY WITH PROPOFOL N/A 09/02/2017   Procedure: COLONOSCOPY WITH PROPOFOL;  Surgeon: Madison Artist, MD;  Location: [facility] ENDOSCOPY;  Service: Endoscopy;  Laterality: N/A;   HOT HEMOSTASIS N/A 09/02/2017   Procedure: HOT HEMOSTASIS (ARGON PLASMA COAGULATION/BICAP);  Surgeon: Madison Artist, MD;  Location: [facility] ENDOSCOPY;  Service: Endoscopy;  Laterality: N/A;   INSERT / REPLACE / REMOVE PACEMAKER     JOINT REPLACEMENT Bilateral    knee   right knee surgery Right 05/2016   ? R TKA: no records.   STRABISMUS SURGERY  1939   TONSILLECTOMY  1938   TRANSTHORACIC ECHOCARDIOGRAM  01/13/12   Septal dyssynergy, EF 50%, LV relaxation impaired.  No significant valvular abnormalities.    Current  Medications: Outpatient Medications Prior to Visit  Medication Sig Dispense Refill   apixaban (ELIQUIS) 5 MG TABS tablet Take 1 tablet (5 mg total) by mouth 2 (two) times daily. 180 tablet 0   calcium-vitamin D (OSCAL WITH D) 500-200 MG-UNIT tablet Take 1 tablet by mouth.     Cholecalciferol 25 MCG (1000 UT) tablet Take 1,000 Units by mouth daily.     cyanocobalamin 100 MCG tablet Take by mouth daily.     donepezil (ARICEPT) 10 MG tablet Take 1 tablet (10 mg total) by mouth at bedtime. 90 tablet 3   ELDERBERRY PO Take 1 tablet by mouth daily in the afternoon. Chew Gummie     Iron-Vitamin C 100-250 MG TABS Take by mouth.     mirabegron ER (MYRBETRIQ) 25 MG TB24 tablet Take 1 tablet (25 mg total) by mouth daily. 90 tablet 3   omeprazole (PRILOSEC) 20 MG capsule TAKE 1 CAPSULE (20 MG TOTAL) BY MOUTH DAILY. PLEASE SCHEDULE FOLLOW UP APPT WITH DR. Jonni Sanger FOR FURTHER REFILLS  30 capsule 0   PEDIATRIC MULTIVITAMINS-IRON PO Take 1 tablet by mouth daily in the afternoon. Chew gummie     metoprolol tartrate (LOPRESSOR) 25 MG tablet TAKE 1 TABLET BY MOUTH TWICE A DAY 180 tablet 3   No facility-administered medications prior to visit.     Allergies:   Penicillins, Nickel, Avapro [irbesartan], Lisinopril, Simvastatin, Latex, Sulfa antibiotics, and Sulfamethoxazole   Social History   Socioeconomic History   Marital status: Widowed    Spouse name: Not on file   Number of children: Not on file   Years of education: Not on file   Highest education level: Not on file  Occupational History   Occupation: retired   Tobacco Use   Smoking status: Former    Types: Cigarettes    Quit date: 05/31/1958    Years since quitting: 63.7   Smokeless tobacco: Never  Vaping Use   Vaping Use: Never used  Substance and Sexual Activity   Alcohol use: Not Currently    Alcohol/week: 1.0 standard drink of alcohol    Types: 1 Glasses of wine per week   Drug use: No   Sexual activity: Not on file  Other Topics Concern    Not on file  Social History Narrative   Widower, husband died around 77 (wrongful death per pt's report).   Originally from [place], has been in [place] since about 1997.   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   Distant hx of tobacco abuse.  No alcohol or drugs.   Social Determinants of Health   Financial Resource Strain: Low Risk  (02/27/2020)   Overall Financial Resource Strain (CARDIA)    Difficulty of Paying Living Expenses: Not hard at all  Food Insecurity: No Food Insecurity (11/22/2021)   Hunger Vital Sign    Worried About Running Out of Food in the Last Year: Never true    Ran Out of Food in the Last Year: Never true  Transportation Needs: No Transportation Needs (11/22/2021)   PRAPARE - Hydrologist (Medical): No    Lack of Transportation (Non-Medical): No  Physical Activity: Inactive (02/27/2020)   Exercise Vital Sign    Days of Exercise per Week: 0 days    Minutes of Exercise per Session: 0 min  Stress: No Stress Concern Present (02/27/2020)   [place]    Feeling of Stress : Not at all  Social Connections: Moderately Isolated (02/27/2020)   Social Connection and Isolation Panel [NHANES]    Frequency of Communication with Friends and Family: More than three times a week    Frequency of Social Gatherings with Friends and Family: More than three times a week    Attends Religious Services: More than 4 times per year    Active Member of [employer] or Organizations: No    Attends Archivist Meetings: Never    Marital Status: Widowed     Family History:  The patient's family history includes Cancer in her father; Diabetes in her mother; Heart disease in her father and mother; Hyperlipidemia in her mother; Hypertension in her mother.   ROS:   Please see the history of present illness.    ROS all other systems are  reviewed and are negative  PHYSICAL EXAM:   VS:  BP 130/71 (BP Location: Left Arm, Patient Position: Sitting, Cuff Size: Large)   Pulse 71   Ht '5\' 2"'$  (  1.575 m)   Wt 70.1 kg   SpO2 95%   BMI 28.28 kg/m       General: Alert, oriented x3, no distress, appears very comfortable.  Healthy left subclavian pacemaker site. Head: no evidence of trauma, PERRL, EOMI, no exophtalmos or lid lag, no myxedema, no xanthelasma; normal ears, nose and oropharynx Neck: normal jugular venous pulsations and no hepatojugular reflux; brisk carotid pulses without delay and no carotid bruits Chest: clear to auscultation, no signs of consolidation by percussion or palpation, normal fremitus, symmetrical and full respiratory excursions Cardiovascular: normal position and quality of the apical impulse, regular rhythm, normal first and second heart sounds, no murmurs, rubs or gallops Abdomen: no tenderness or distention, no masses by palpation, no abnormal pulsatility or arterial bruits, normal bowel sounds, no hepatosplenomegaly Extremities: no clubbing, cyanosis or edema; 2+ radial, ulnar and brachial pulses bilaterally; 2+ right femoral, posterior tibial and dorsalis pedis pulses; 2+ left femoral, posterior tibial and dorsalis pedis pulses; no subclavian or femoral bruits Neurological: grossly nonfocal Psych: Normal mood and affect     Wt Readings from Last 3 Encounters:  01/27/22 70.1 kg  12/18/21 71.3 kg  11/08/21 71 kg      Studies/Labs Reviewed:   Echocardiogram 12/05/2021    1. Left ventricular ejection fraction, by estimation, is 60 to 65%. The  left ventricle has normal function. The left ventricle has no regional  wall motion abnormalities. Left ventricular diastolic parameters are  indeterminate.   2. Right ventricular systolic function is mildly reduced. The right  ventricular size is normal. There is normal pulmonary artery systolic  pressure. The estimated right ventricular systolic  pressure is 52.8 mmHg.   3. Left atrial size was moderately dilated.   4. Right atrial size was mildly dilated.   5. The mitral valve is normal in structure. Trivial mitral valve  regurgitation. No evidence of mitral stenosis.   6. The aortic valve is tricuspid. Aortic valve regurgitation is not  visualized. No aortic stenosis is present.   7. The inferior vena cava is dilated in size with >50% respiratory  variability, suggesting right atrial pressure of 8 mmHg.   EKG:  EKG is ordered today.  Intracardiac electrogram shows atrial fibrillation and 100% biventricular pacing.  The previous ECG was personally viewed: biventricular pacing with a very broad QRS at about 184 ms but with distinctly positive R waves in leads V1 and V2.  QTc 533 ms. Recent Labs: No results found for requested labs within last 365 days.   Lipid Panel    Component Value Date/Time   CHOL 205 (H) 09/07/2020 1048   TRIG 205.0 (H) 09/07/2020 1048   HDL 62.60 09/07/2020 1048   CHOLHDL 3 09/07/2020 1048   VLDL 41.0 (H) 09/07/2020 1048   LDLCALC 103 (H) 10/25/2019 0938   LDLDIRECT 127.0 09/07/2020 1048    Additional studies/ records that were reviewed today include:   Comprehensive pacemaker check in the office today.   ASSESSMENT:    1. Persistent atrial fibrillation ([place])   2. Acquired thrombophilia ([place])   3. Nonischemic cardiomyopathy ([place])   4. Complete heart block ([place])   5. HTN (hypertension), benign   6. Hypercholesterolemia   7. Biventricular cardiac pacemaker in situ      PLAN:  In order of problems listed above:  Afib: Longstanding persistent arrhythmia, now going on for over 15 months.  Cardioversion was offered and declined.   Continue anticoagulation.  CRT-P reprogrammed VVIR.  Decrease/wean metoprolol to 12.5 mg  twice daily.  Consider stopping it altogether if he does not need it for blood pressure. Anticoagulation: Eliquis is well-tolerated.  CHADSVasc 3 (age 89, gender, +/- HTN).  She did  have mild GI bleeding in June 2021, but none since.  Normal hemoglobin and normal renal function. CMP: Excellent left ventricular systolic function with EF 60 to 65% (unable to fully assess diastolic function due to atrial fibrillation, but the medial and lateral mitral annulus diastolic velocities E prime are 10-12, remarkably good for her age.  Encourage some type of physical activity on a daily basis to maintain conditioning and strength of leg muscles. CHB: Pacemaker dependent. HTN: Amlodipine was discontinued a month ago and she still has normal blood pressure.  Decrease metoprolol to 12.5 mg twice daily, low threshold to discontinue it altogether. HLP: mildly elvated LDL, no need for pharmacological therapy, absent vascular disease. Concentrate on diet and exercise. CRT-P: Dual-chamber device now programmed VVIR due to permanent atrial fibrillation.  Estimated generator longevity 2.7 years.  Good biventricular capture today.   Cognitive deficits: Slowly progressive.  Remains very interactive, but clearly has gaps in her memory.  Now living with her daughter.  Medication Adjustments/Labs and Tests Ordered: Current medicines are reviewed at length with the patient today.  Concerns regarding medicines are outlined above.  Medication changes, Labs and Tests ordered today are listed in the Patient Instructions below. Patient Instructions  Medication Instructions:  Your physician has recommended you make the following change in your medication:   Decrease Metoprolol Tartrate '25mg'$  to 1/2 tablet by mouth twice daily.  *If you need a refill on your cardiac medications before your next appointment, please call your pharmacy*   Lab Work: None ordered.  If you have labs (blood work) drawn today and your tests are completely normal, you will receive your results only by: [place] (if you have MyChart) OR A paper copy in the mail If you have any lab test that is abnormal or we need to change  your treatment, we will call you to review the results.   Testing/Procedures: None ordered.    Follow-Up: At [facility], you and your health needs are our priority.  As part of our continuing mission to provide you with exceptional heart care, we have created designated Provider Care Teams.  These Care Teams include your primary Cardiologist (physician) and Advanced Practice Providers (APPs -  Physician Assistants and Nurse Practitioners) who all work together to provide you with the care you need, when you need it.  We recommend signing up for the patient portal called "MyChart".  Sign up information is provided on this After Visit Summary.  MyChart is used to connect with patients for Virtual Visits (Telemedicine).  Patients are able to view lab/test results, encounter notes, upcoming appointments, etc.  Non-urgent messages can be sent to your provider as well.   To learn more about what you can do with MyChart, go to [email or web address].    Your next appointment:   12 months with Dr Madison White  Important Information About Sugar          Signed, Madison Klein, MD  01/27/2022 4:36 PM    [facility] [address], [place], [place]  [address] Phone: [phone]; Fax: [phone]

## 2022-01-27 NOTE — Patient Instructions (Signed)
Medication Instructions:  Your physician has recommended you make the following change in your medication:   Decrease Metoprolol Tartrate '25mg'$  to 1/2 tablet by mouth twice daily.  *If you need a refill on your cardiac medications before your next appointment, please call your pharmacy*   Lab Work: None ordered.  If you have labs (blood work) drawn today and your tests are completely normal, you will receive your results only by: [place] (if you have MyChart) OR A paper copy in the mail If you have any lab test that is abnormal or we need to change your treatment, we will call you to review the results.   Testing/Procedures: None ordered.    Follow-Up: At [facility], you and your health needs are our priority.  As part of our continuing mission to provide you with exceptional heart care, we have created designated Provider Care Teams.  These Care Teams include your primary Cardiologist (physician) and Advanced Practice Providers (APPs -  Physician Assistants and Nurse Practitioners) who all work together to provide you with the care you need, when you need it.  We recommend signing up for the patient portal called "MyChart".  Sign up information is provided on this After Visit Summary.  MyChart is used to connect with patients for Virtual Visits (Telemedicine).  Patients are able to view lab/test results, encounter notes, upcoming appointments, etc.  Non-urgent messages can be sent to your provider as well.   To learn more about what you can do with MyChart, go to [email or web address].    Your next appointment:   12 months with Dr Sallyanne Kuster  Important Information About Sugar

## 2022-02-03 ENCOUNTER — Ambulatory Visit (INDEPENDENT_AMBULATORY_CARE_PROVIDER_SITE_OTHER): Payer: Medicare HMO

## 2022-02-03 DIAGNOSIS — I442 Atrioventricular block, complete: Secondary | ICD-10-CM

## 2022-02-05 LAB — CUP PACEART REMOTE DEVICE CHECK
Battery Remaining Longevity: 31 mo
Battery Remaining Percentage: 36 %
Battery Voltage: 2.96 V
Date Time Interrogation Session: 20231121233912
Implantable Lead Connection Status: 753985
Implantable Lead Connection Status: 753985
Implantable Lead Connection Status: 753985
Implantable Lead Implant Date: 20190219
Implantable Lead Implant Date: 20190219
Implantable Lead Implant Date: 20190219
Implantable Lead Location: 753858
Implantable Lead Location: 753859
Implantable Lead Location: 753860
Implantable Pulse Generator Implant Date: 20190219
Lead Channel Impedance Value: 610 Ohm
Lead Channel Impedance Value: 650 Ohm
Lead Channel Pacing Threshold Amplitude: 0.75 V
Lead Channel Pacing Threshold Amplitude: 1.875 V
Lead Channel Pacing Threshold Pulse Width: 0.5 ms
Lead Channel Pacing Threshold Pulse Width: 1 ms
Lead Channel Sensing Intrinsic Amplitude: 12 mV
Lead Channel Setting Pacing Amplitude: 2.375
Lead Channel Setting Pacing Amplitude: 2.5 V
Lead Channel Setting Pacing Pulse Width: 0.5 ms
Lead Channel Setting Pacing Pulse Width: 1 ms
Lead Channel Setting Sensing Sensitivity: 4 mV
Pulse Gen Model: 3262
Pulse Gen Serial Number: 8995296

## 2022-02-27 ENCOUNTER — Other Ambulatory Visit: Payer: Self-pay | Admitting: Cardiovascular Disease

## 2022-02-27 DIAGNOSIS — I4819 Other persistent atrial fibrillation: Secondary | ICD-10-CM

## 2022-02-27 NOTE — Telephone Encounter (Addendum)
Prescription refill request for Eliquis received. Indication:  Last office visit: 01/27/22 ([place])  Scr: 0.78 (09/07/20)  Age: 88 Weight: 70.1kg  Labs overdue. Will call PCP to determine if labs have been drawn within the past year.

## 2022-02-28 NOTE — Telephone Encounter (Signed)
Called and spoke with pt's daughter, Juliann Pulse. She stated she was aware pt had not had labs drawn recently; however she is going to PCP in January and will have labs drawn at that appt. I made her aware that Ms Landowski will need labs for future refills. Verbalized understanding. 30 day supply sent to prevent any missed doses.

## 2022-03-19 ENCOUNTER — Other Ambulatory Visit: Payer: Self-pay | Admitting: Family Medicine

## 2022-03-20 NOTE — Progress Notes (Signed)
Remote pacemaker transmission.   

## 2022-04-03 ENCOUNTER — Telehealth: Payer: Self-pay | Admitting: Family Medicine

## 2022-04-03 ENCOUNTER — Other Ambulatory Visit: Payer: Self-pay | Admitting: Cardiovascular Disease

## 2022-04-03 DIAGNOSIS — I4819 Other persistent atrial fibrillation: Secondary | ICD-10-CM

## 2022-04-03 NOTE — Telephone Encounter (Signed)
Prescription refill request for Eliquis received. Indication: Afib  Last office visit:01/27/22 ([place])  Scr: 0.78 (09/07/20)  Age: 89 Weight: 70.1kg  Labs overdue. Called and spoke with pt's daughter, Juliann Pulse. Made her aware that pt has not had labs drawn since 09/07/20 and pt needs labs yearly to confirm appropriate dose for Eliquis. I also called PCP and confirmed labs have not been drawn within the past year. PCP stated she did have an office visit last month; however, labs were not drawn. I made Juliann Pulse aware that labs will be ordered and pt can come to office to have them drawn. Juliann Pulse stated pt's PCP is much closer and she will take her mom there next week to have them drawn. 30 day supply sent to pharmacy to prevent missed doses.

## 2022-04-03 NOTE — Telephone Encounter (Signed)
Caller states: - Sister, Madison White, informed her that pharmacy will not refill patient's eliquis until she has updated labs completed  Please view telephone note from Cardiology on 04/03/22 titled Medication refill for more info.  Per telephone note, although cardiology manages medication they have no recent labs on patient. Patient's daughter states our office is closer and requests to have labs drawn at our office so patient can continue eliquis. Please Advise.

## 2022-04-04 ENCOUNTER — Other Ambulatory Visit: Payer: Self-pay

## 2022-04-04 MED ORDER — APIXABAN 5 MG PO TABS: 5.0000 mg | ORAL_TABLET | Freq: Two times a day (BID) | ORAL | 0 refills | Status: DC

## 2022-04-04 NOTE — Telephone Encounter (Signed)
30 day supply sent

## 2022-04-07 ENCOUNTER — Ambulatory Visit: Payer: Medicare HMO | Admitting: Family Medicine

## 2022-04-09 ENCOUNTER — Encounter: Payer: Self-pay | Admitting: Family Medicine

## 2022-04-09 ENCOUNTER — Ambulatory Visit (INDEPENDENT_AMBULATORY_CARE_PROVIDER_SITE_OTHER): Payer: Medicare HMO | Admitting: Family Medicine

## 2022-04-09 VITALS — BP 116/84 | HR 71 | Temp 97.3°F | Resp 16 | Ht 61.0 in | Wt 152.6 lb

## 2022-04-09 DIAGNOSIS — I428 Other cardiomyopathies: Secondary | ICD-10-CM | POA: Diagnosis not present

## 2022-04-09 DIAGNOSIS — I10 Essential (primary) hypertension: Secondary | ICD-10-CM

## 2022-04-09 DIAGNOSIS — R69 Illness, unspecified: Secondary | ICD-10-CM | POA: Diagnosis not present

## 2022-04-09 DIAGNOSIS — F01B18 Vascular dementia, moderate, with other behavioral disturbance: Secondary | ICD-10-CM | POA: Diagnosis not present

## 2022-04-09 DIAGNOSIS — Z8679 Personal history of other diseases of the circulatory system: Secondary | ICD-10-CM | POA: Diagnosis not present

## 2022-04-09 DIAGNOSIS — I4819 Other persistent atrial fibrillation: Secondary | ICD-10-CM

## 2022-04-09 DIAGNOSIS — Z79899 Other long term (current) drug therapy: Secondary | ICD-10-CM | POA: Diagnosis not present

## 2022-04-09 DIAGNOSIS — Z7901 Long term (current) use of anticoagulants: Secondary | ICD-10-CM

## 2022-04-09 LAB — COMPREHENSIVE METABOLIC PANEL
ALT: 9 U/L (ref 0–35)
AST: 16 U/L (ref 0–37)
Albumin: 3.8 g/dL (ref 3.5–5.2)
Alkaline Phosphatase: 42 U/L (ref 39–117)
BUN: 9 mg/dL (ref 6–23)
CO2: 30 mEq/L (ref 19–32)
Calcium: 9.4 mg/dL (ref 8.4–10.5)
Chloride: 105 mEq/L (ref 96–112)
Creatinine, Ser: 0.94 mg/dL (ref 0.40–1.20)
GFR: 53.11 mL/min — ABNORMAL LOW (ref >60.00)
Glucose, Bld: 160 mg/dL — ABNORMAL HIGH (ref 70–99)
Potassium: 4.4 mEq/L (ref 3.5–5.1)
Sodium: 144 mEq/L (ref 135–145)
Total Bilirubin: 0.4 mg/dL (ref 0.2–1.2)
Total Protein: 6.5 g/dL (ref 6.0–8.3)

## 2022-04-09 LAB — CBC WITH DIFFERENTIAL/PLATELET
Basophils Absolute: 0.1 10*3/uL (ref 0.0–0.1)
Basophils Relative: 0.9 % (ref 0.0–3.0)
Eosinophils Absolute: 0.2 10*3/uL (ref 0.0–0.7)
Eosinophils Relative: 2.5 % (ref 0.0–5.0)
HCT: 41.1 % (ref 36.0–46.0)
Hemoglobin: 13.5 g/dL (ref 12.0–15.0)
Lymphocytes Relative: 17.4 % (ref 12.0–46.0)
Lymphs Abs: 1.3 10*3/uL (ref 0.7–4.0)
MCHC: 32.9 g/dL (ref 30.0–36.0)
MCV: 88.1 fl (ref 78.0–100.0)
Monocytes Absolute: 0.4 10*3/uL (ref 0.1–1.0)
Monocytes Relative: 5.5 % (ref 3.0–12.0)
Neutro Abs: 5.5 10*3/uL (ref 1.4–7.7)
Neutrophils Relative %: 73.7 % (ref 43.0–77.0)
Platelets: 347 10*3/uL (ref 150.0–400.0)
RBC: 4.66 Mil/uL (ref 3.87–5.11)
RDW: 15.1 % (ref 11.5–15.5)
WBC: 7.5 10*3/uL (ref 4.0–10.5)

## 2022-04-09 LAB — LIPID PANEL
Cholesterol: 173 mg/dL (ref 0–200)
HDL: 60.1 mg/dL (ref >39.00)
LDL Cholesterol: 88 mg/dL (ref 0–99)
NonHDL: 112.75
Total CHOL/HDL Ratio: 3
Triglycerides: 122 mg/dL (ref 0.0–149.0)
VLDL: 24.4 mg/dL (ref 0.0–40.0)

## 2022-04-09 LAB — HEMOGLOBIN A1C: Hgb A1c MFr Bld: 6 % (ref 4.6–6.5)

## 2022-04-09 LAB — TSH: TSH: 3.71 u[IU]/mL (ref 0.35–5.50)

## 2022-04-09 LAB — VITAMIN B12: Vitamin B-12: 792 pg/mL (ref 211–911)

## 2022-04-09 MED ORDER — APIXABAN 5 MG PO TABS: 5.0000 mg | ORAL_TABLET | Freq: Two times a day (BID) | ORAL | 3 refills | Status: DC

## 2022-04-09 MED ORDER — MEMANTINE HCL 10 MG PO TABS: 10.0000 mg | ORAL_TABLET | Freq: Two times a day (BID) | ORAL | 3 refills | Status: DC

## 2022-04-09 MED ORDER — MEMANTINE HCL 28 X 5 MG & 21 X 10 MG PO TABS: ORAL_TABLET | ORAL | 12 refills | Status: DC

## 2022-04-09 NOTE — Patient Instructions (Signed)
Please return in 6 months for hypertension follow up and memory recheck.   I will release your lab results to you on your MyChart account with further instructions. You may see the results before I do, but when I review them I will send you a message with my report or have my assistant call you if things need to be discussed. Please reply to my message with any questions. Thank you!   Let me know if you have any problems adding the namenda to the aricept.   If you have any questions or concerns, please don't hesitate to send me a message via MyChart or call the office at [phone]. Thank you for visiting with [place] today! It's our pleasure caring for you.

## 2022-04-09 NOTE — Progress Notes (Signed)
error 

## 2022-04-09 NOTE — Progress Notes (Signed)
Subjective  CC:  Chief Complaint  Patient presents with   Medication Management    Needing labs prior to eliquis script      HPI: Madison White is a 89 y.o. female who presents to the office today to address the problems listed above in the chief complaint. Annual f/u.  Hypertension f/u: Control is good . Pt reports she is doing well. taking medications as instructed, no medication side effects noted, no TIAs, no chest pain on exertion, no dyspnea on exertion, no swelling of ankles. She denies adverse effects from his BP medications. Compliance with medication is good.  Reviewed [facility]ilogy notes: stable persistent afib on eloquis and h/o heart block with pacer. No conerns Dementia: doing well overall; lives with daughter. Supervised. Happy and pleasant. Memory is fair overall with good days and bad days. No behavioral or sleep concerns. On aricept > 1 year now.  No falls on eloquis [name] is controlled. At risk for low b12.   Assessment  1. Essential hypertension   2. Nonischemic cardiomyopathy ([place])   3. History of complete heart block   4. Persistent atrial fibrillation ([place])   5. Moderate vascular dementia with other behavioral disturbance ([place])   6. Current use of long term anticoagulation   7. Long-term current use of proton pump inhibitor therapy      Plan   Hypertension f/u: BP control is well controlled. Recheck lytes, renal and lipids Continue ppi and check b12 levels Dementia; slowly progresing. Add nemenda to aricept.  Afib on eloquis. Fall prevention discussed. refilled Afib with rate control.  No chf sxs.   Education regarding management of these chronic disease states was given. Management strategies discussed on successive visits include dietary and exercise recommendations, goals of achieving and maintaining IBW, and lifestyle modifications aiming for adequate sleep and minimizing stressors.   Follow up: 6 mo for recheck bp and memory  Orders Placed This  Encounter  Procedures   CBC with Differential/Platelet   Comprehensive metabolic panel   Lipid panel   Hemoglobin A1c   TSH   Vitamin B12   Meds ordered this encounter  Medications   apixaban (ELIQUIS) 5 MG TABS tablet    Sig: Take 1 tablet (5 mg total) by mouth 2 (two) times daily.    Dispense:  180 tablet    Refill:  3    Please hold for next refill due next month   memantine (NAMENDA TITRATION PAK) tablet pack    Sig: 5 mg/day for =1 week; 5 mg twice daily for =1 week; 15 mg/day given in 5 mg and 10 mg separated doses for =1 week; then 10 mg twice daily    Dispense:  49 tablet    Refill:  12   memantine (NAMENDA) 10 MG tablet    Sig: Take 1 tablet (10 mg total) by mouth 2 (two) times daily.    Dispense:  180 tablet    Refill:  3      BP Readings from Last 3 Encounters:  04/09/22 116/84  01/27/22 130/71  12/18/21 (!) 118/58   Wt Readings from Last 3 Encounters:  04/09/22 152 lb 9.6 oz (69.2 kg)  01/27/22 154 lb 9.6 oz (70.1 kg)  12/18/21 157 lb 3.2 oz (71.3 kg)    Lab Results  Component Value Date   CHOL 205 (H) 09/07/2020   CHOL 181 10/25/2019   CHOL 207 (H) 04/05/2019   Lab Results  Component Value Date   HDL 62.60 09/07/2020  HDL 57 10/25/2019   HDL 66.20 04/05/2019   Lab Results  Component Value Date   LDLCALC 103 (H) 10/25/2019   LDLCALC 111 (H) 01/07/2016   Lab Results  Component Value Date   TRIG 205.0 (H) 09/07/2020   TRIG 112 10/25/2019   TRIG 207.0 (H) 04/05/2019   Lab Results  Component Value Date   CHOLHDL 3 09/07/2020   CHOLHDL 3.2 10/25/2019   CHOLHDL 3 04/05/2019   Lab Results  Component Value Date   LDLDIRECT 127.0 09/07/2020   LDLDIRECT 125.0 04/05/2019   LDLDIRECT 117.9 05/29/2011   Lab Results  Component Value Date   CREATININE 0.78 09/07/2020   BUN 12 09/07/2020   NA 141 09/07/2020   K 3.6 09/07/2020   CL 102 09/07/2020   CO2 28 09/07/2020    The ASCVD Risk score (Arnett DK, et al., 2019) failed to calculate for  the following reasons:   The 2019 ASCVD risk score is only valid for ages 47 to 48  I reviewed the patients updated PMH, FH, and SocHx.    Patient Active Problem List   Diagnosis Date Noted   Vascular dementia ([place]) 06/18/2021    Priority: High   History of DVT of lower extremity left 12/27/2019    Priority: High   Persistent atrial fibrillation ([place]) 08/01/2019    Priority: High   Presence of biventricular cardiac pacemaker 04/05/2019    Priority: High   History of complete heart block 05/05/2017    Priority: High   Nonischemic cardiomyopathy ([place]) 11/17/2014    Priority: High   Dyslipidemia 09/02/2012    Priority: High   Essential hypertension 05/31/2011    Priority: High   Overactive bladder 03/19/2021    Priority: Medium    Recurrent nephrolithiasis 06/17/2017    Priority: Medium    Hydronephrosis with urinary obstruction due to renal calculus 03/01/2017    Priority: Medium    Upper airway cough syndrome 02/26/2014    Priority: Medium    Constipation, chronic 02/26/2014    Priority: Medium    GERD (gastroesophageal reflux disease) 11/19/2012    Priority: Medium    Left bundle branch block 12/18/2011    Priority: Medium    Renal cyst 03/01/2017    Priority: Low   Total knee replacement status, bilateral 11/06/2016    Priority: Low   Long-term current use of proton pump inhibitor therapy 04/09/2022   Current use of long term anticoagulation 04/09/2022   Complete heart block ([place]) 12/18/2021    Allergies: Penicillins, Nickel, Avapro [irbesartan], Lisinopril, Simvastatin, Latex, Sulfa antibiotics, and Sulfamethoxazole  Social History: Patient  reports that she quit smoking about 63 years ago. Her smoking use included cigarettes. She has never used smokeless tobacco. She reports that she does not currently use alcohol after a past usage of about 1.0 standard drink of alcohol per week. She reports that she does not use drugs.  Current Meds  Medication Sig    calcium-vitamin D (OSCAL WITH D) 500-200 MG-UNIT tablet Take 1 tablet by mouth.   Cholecalciferol 25 MCG (1000 UT) tablet Take 1,000 Units by mouth daily.   cyanocobalamin 100 MCG tablet Take by mouth daily.   donepezil (ARICEPT) 10 MG tablet TAKE 1 TABLET BY MOUTH EVERYDAY AT BEDTIME   ELDERBERRY PO Take 1 tablet by mouth daily in the afternoon. Chew Gummie   Iron-Vitamin C 100-250 MG TABS Take by mouth.   memantine (NAMENDA TITRATION PAK) tablet pack 5 mg/day for =1 week; 5 mg twice daily  for =1 week; 15 mg/day given in 5 mg and 10 mg separated doses for =1 week; then 10 mg twice daily   memantine (NAMENDA) 10 MG tablet Take 1 tablet (10 mg total) by mouth 2 (two) times daily.   metoprolol tartrate (LOPRESSOR) 25 MG tablet Take 0.5 tablets (12.5 mg total) by mouth 2 (two) times daily.   mirabegron ER (MYRBETRIQ) 25 MG TB24 tablet Take 1 tablet (25 mg total) by mouth daily.   omeprazole (PRILOSEC) 20 MG capsule TAKE 1 CAPSULE (20 MG TOTAL) BY MOUTH DAILY. PLEASE SCHEDULE FOLLOW UP APPT WITH DR. Jonni Sanger FOR FURTHER REFILLS   PEDIATRIC MULTIVITAMINS-IRON PO Take 1 tablet by mouth daily in the afternoon. Chew gummie   [DISCONTINUED] apixaban (ELIQUIS) 5 MG TABS tablet Take 1 tablet (5 mg total) by mouth 2 (two) times daily. MUST HAVE UPDATED LABS FOR REFILL   [DISCONTINUED] apixaban (ELIQUIS) 5 MG TABS tablet Take 1 tablet (5 mg total) by mouth 2 (two) times daily.    Review of Systems: Cardiovascular: negative for chest pain, palpitations, leg swelling, orthopnea Respiratory: negative for SOB, wheezing or persistent cough Gastrointestinal: negative for abdominal pain Genitourinary: negative for dysuria or gross hematuria  Objective  Vitals: BP 116/84   Pulse 71   Temp (!) 97.3 F (36.3 C) (Temporal)   Resp 16   Ht '5\' 1"'$  (1.549 m)   Wt 152 lb 9.6 oz (69.2 kg)   SpO2 95%   BMI 28.83 kg/m  General: no acute distress , very pleasant HEENT:  Normocephalic, atraumatic, supple neck   Cardiovascular:  RRR without murmur. no edema Respiratory:  Good breath sounds bilaterally, CTAB with normal respiratory effort Skin:  Warm, no rashes  Commons side effects, risks, benefits, and alternatives for medications and treatment plan prescribed today were discussed, and the patient expressed understanding of the given instructions. Patient is instructed to call or message via MyChart if he/she has any questions or concerns regarding our treatment plan. No barriers to understanding were identified. We discussed Red Flag symptoms and signs in detail. Patient expressed understanding regarding what to do in case of urgent or emergency type symptoms.  Medication list was reconciled, printed and provided to the patient in AVS. Patient instructions and summary information was reviewed with the patient as documented in the AVS. This note was prepared with assistance of Dragon voice recognition software. Occasional wrong-word or sound-a-like substitutions may have occurred due to the inherent limitation

## 2022-04-24 ENCOUNTER — Encounter (HOSPITAL_COMMUNITY): Payer: Self-pay | Admitting: *Deleted

## 2022-04-30 ENCOUNTER — Other Ambulatory Visit: Payer: Self-pay | Admitting: Family Medicine

## 2022-05-05 ENCOUNTER — Ambulatory Visit (INDEPENDENT_AMBULATORY_CARE_PROVIDER_SITE_OTHER): Payer: Medicare HMO

## 2022-05-05 DIAGNOSIS — I442 Atrioventricular block, complete: Secondary | ICD-10-CM

## 2022-05-05 LAB — CUP PACEART REMOTE DEVICE CHECK
Battery Remaining Longevity: 29 mo
Battery Remaining Percentage: 33 %
Battery Voltage: 2.95 V
Date Time Interrogation Session: 20240219020015
Implantable Lead Connection Status: 753985
Implantable Lead Connection Status: 753985
Implantable Lead Connection Status: 753985
Implantable Lead Implant Date: 20190219
Implantable Lead Implant Date: 20190219
Implantable Lead Implant Date: 20190219
Implantable Lead Location: 753858
Implantable Lead Location: 753859
Implantable Lead Location: 753860
Implantable Pulse Generator Implant Date: 20190219
Lead Channel Impedance Value: 640 Ohm
Lead Channel Impedance Value: 640 Ohm
Lead Channel Pacing Threshold Amplitude: 0.75 V
Lead Channel Pacing Threshold Amplitude: 1.375 V
Lead Channel Pacing Threshold Pulse Width: 0.5 ms
Lead Channel Pacing Threshold Pulse Width: 1 ms
Lead Channel Sensing Intrinsic Amplitude: 12 mV
Lead Channel Setting Pacing Amplitude: 2 V
Lead Channel Setting Pacing Amplitude: 2.5 V
Lead Channel Setting Pacing Pulse Width: 0.5 ms
Lead Channel Setting Pacing Pulse Width: 1 ms
Lead Channel Setting Sensing Sensitivity: 4 mV
Pulse Gen Model: 3262
Pulse Gen Serial Number: 8995296

## 2022-05-16 ENCOUNTER — Telehealth: Payer: Self-pay | Admitting: Family Medicine

## 2022-05-16 NOTE — Telephone Encounter (Signed)
Form placed in Andy's box

## 2022-05-16 NOTE — Telephone Encounter (Signed)
..  Type of form received:DMV PLACARD   Additional comments:   Received by: DAUGHTER   Form should be Faxed to: NA   Form should be mailed to:   NA   Is patient requesting call for pickup: YES    Form placed:  Dr Toy Baker folder   Attach charge sheet.  YES   Individual made aware of 3-5 business day turn around (Y/N)?   YES

## 2022-05-20 ENCOUNTER — Telehealth: Payer: Self-pay

## 2022-05-20 NOTE — Telephone Encounter (Signed)
DMV form has been placed up front waiting for daughter to pick up. Madison White has been made aware.

## 2022-06-18 NOTE — Progress Notes (Signed)
Remote pacemaker transmission.   

## 2022-06-19 ENCOUNTER — Ambulatory Visit (INDEPENDENT_AMBULATORY_CARE_PROVIDER_SITE_OTHER): Payer: Medicare HMO | Admitting: Family Medicine

## 2022-06-19 VITALS — BP 138/60 | HR 69 | Temp 97.8°F | Ht 61.0 in | Wt 151.4 lb

## 2022-06-19 DIAGNOSIS — R269 Unspecified abnormalities of gait and mobility: Secondary | ICD-10-CM

## 2022-06-19 DIAGNOSIS — R531 Weakness: Secondary | ICD-10-CM | POA: Diagnosis not present

## 2022-06-19 DIAGNOSIS — M791 Myalgia, unspecified site: Secondary | ICD-10-CM | POA: Diagnosis not present

## 2022-06-19 LAB — URINALYSIS, ROUTINE W REFLEX MICROSCOPIC
Bilirubin Urine: NEGATIVE
Hgb urine dipstick: NEGATIVE
Nitrite: NEGATIVE
Specific Gravity, Urine: >=1.03 — AB (ref 1.000–1.030)
Total Protein, Urine: NEGATIVE
Urine Glucose: NEGATIVE
Urobilinogen, UA: 0.2 (ref 0.0–1.0)
pH: 5.5 (ref 5.0–8.0)

## 2022-06-19 LAB — CBC WITH DIFFERENTIAL/PLATELET
Basophils Absolute: 0.1 10*3/uL (ref 0.0–0.1)
Basophils Relative: 0.7 % (ref 0.0–3.0)
Eosinophils Absolute: 0.1 10*3/uL (ref 0.0–0.7)
Eosinophils Relative: 1.4 % (ref 0.0–5.0)
HCT: 44.5 % (ref 36.0–46.0)
Hemoglobin: 14.7 g/dL (ref 12.0–15.0)
Lymphocytes Relative: 15.4 % (ref 12.0–46.0)
Lymphs Abs: 1.3 10*3/uL (ref 0.7–4.0)
MCHC: 32.9 g/dL (ref 30.0–36.0)
MCV: 87.7 fl (ref 78.0–100.0)
Monocytes Absolute: 0.7 10*3/uL (ref 0.1–1.0)
Monocytes Relative: 7.5 % (ref 3.0–12.0)
Neutro Abs: 6.5 10*3/uL (ref 1.4–7.7)
Neutrophils Relative %: 75 % (ref 43.0–77.0)
Platelets: 310 10*3/uL (ref 150.0–400.0)
RBC: 5.08 Mil/uL (ref 3.87–5.11)
RDW: 15.2 % (ref 11.5–15.5)
WBC: 8.7 10*3/uL (ref 4.0–10.5)

## 2022-06-19 LAB — COMPREHENSIVE METABOLIC PANEL
ALT: 10 U/L (ref 0–35)
AST: 18 U/L (ref 0–37)
Albumin: 3.9 g/dL (ref 3.5–5.2)
Alkaline Phosphatase: 44 U/L (ref 39–117)
BUN: 12 mg/dL (ref 6–23)
CO2: 30 mEq/L (ref 19–32)
Calcium: 9.4 mg/dL (ref 8.4–10.5)
Chloride: 103 mEq/L (ref 96–112)
Creatinine, Ser: 0.8 mg/dL (ref 0.40–1.20)
GFR: 64.37 mL/min (ref >60.00)
Glucose, Bld: 117 mg/dL — ABNORMAL HIGH (ref 70–99)
Potassium: 3.7 mEq/L (ref 3.5–5.1)
Sodium: 141 mEq/L (ref 135–145)
Total Bilirubin: 0.4 mg/dL (ref 0.2–1.2)
Total Protein: 6.6 g/dL (ref 6.0–8.3)

## 2022-06-19 LAB — CK: Total CK: 80 U/L (ref 7–177)

## 2022-06-19 LAB — TSH: TSH: 2.8 u[IU]/mL (ref 0.35–5.50)

## 2022-06-19 MED ORDER — DONEPEZIL HCL 10 MG PO TABS: ORAL_TABLET | ORAL | 3 refills | Status: DC

## 2022-06-19 NOTE — Progress Notes (Signed)
Subjective  CC:  Chief Complaint  Patient presents with   referral    Needing referral for someone to come in to do PT with legs    HPI: Madison White is a 89 y.o. female who presents to the office today to address the problems listed above in the chief complaint. 89 year old female here with her daughter with whom she lives.  Daughter asking for physical therapy referral because patient seems weaker.  On further questioning, over the last 3 weeks, patient has been less mobile, more shaky with ambulation, and has been complaining of pain across her body.  Sounds like muscle pain.  1 day she stayed in bed till 4 PM due to not feeling well and pain.  This is very unusual for her.  She has not been lethargic, febrile, coughing.  She has had no GI symptoms.  Her appetite is at her baseline.  She has denied urinary complaints.  At times her daughter says she will become short of breath with ambulation but this is highly variable.  Today she is having a good day.  Patient remains confused and cannot offer much history.  There have been no new medication changes although she did try Namenda for about a week in January and this caused increased confusion so they stopped it.  She is not on a statin.  Assessment  1. Myalgia   2. Generalized weakness   3. Gait abnormality      Plan  Change in status with myalgias and weakness: Unclear etiology.start Workup: Check for UTI, electrolyte, kidney or liver abnormalities.  Check CBC.  Recheck thyroid.  Reviewed lab work from January which was all stable.  Her physical exam is unremarkable today.  Order home physical therapy for further evaluation and assessment of gait.  Vital signs are stable.  Follow up: For recheck if not improving 10/08/2022  Orders Placed This Encounter  Procedures   Urine Culture   CBC with Differential/Platelet   Comprehensive metabolic panel   CK   TSH   Urinalysis, Routine w reflex microscopic   Ambulatory referral to [facility] ordered this encounter  Medications   donepezil (ARICEPT) 10 MG tablet    Sig: TAKE 1 TABLET BY MOUTH EVERYDAY AT BEDTIME    Dispense:  90 tablet    Refill:  3      I reviewed the patients updated PMH, FH, and SocHx.    Patient Active Problem List   Diagnosis Date Noted   Long-term current use of proton pump inhibitor therapy 04/09/2022    Priority: High   Current use of long term anticoagulation 04/09/2022    Priority: High   Vascular dementia 06/18/2021    Priority: High   History of DVT of lower extremity left 12/27/2019    Priority: High   Persistent atrial fibrillation 08/01/2019    Priority: High   Presence of biventricular cardiac pacemaker 04/05/2019    Priority: High   History of complete heart block 05/05/2017    Priority: High   Nonischemic cardiomyopathy 11/17/2014    Priority: High   Dyslipidemia 09/02/2012    Priority: High   Essential hypertension 05/31/2011    Priority: High   Overactive bladder 03/19/2021    Priority: Medium    Recurrent nephrolithiasis 06/17/2017    Priority: Medium    Hydronephrosis with urinary obstruction due to renal calculus 03/01/2017    Priority: Medium    Upper airway cough syndrome 02/26/2014  Priority: Medium    Constipation, chronic 02/26/2014    Priority: Medium    GERD (gastroesophageal reflux disease) 11/19/2012    Priority: Medium    Left bundle branch block 12/18/2011    Priority: Medium    Renal cyst 03/01/2017    Priority: Low   Total knee replacement status, bilateral 11/06/2016    Priority: Low   Complete heart block 12/18/2021   Current Meds  Medication Sig   apixaban (ELIQUIS) 5 MG TABS tablet Take 1 tablet (5 mg total) by mouth 2 (two) times daily.   calcium-vitamin D (OSCAL WITH D) 500-200 MG-UNIT tablet Take 1 tablet by mouth.   Cholecalciferol 25 MCG (1000 UT) tablet Take 1,000 Units by mouth daily.   cyanocobalamin 100 MCG tablet Take by mouth daily.   ELDERBERRY PO Take 1 tablet  by mouth daily in the afternoon. Chew Gummie   Iron-Vitamin C 100-250 MG TABS Take by mouth.   metoprolol tartrate (LOPRESSOR) 25 MG tablet Take 0.5 tablets (12.5 mg total) by mouth 2 (two) times daily.   mirabegron ER (MYRBETRIQ) 25 MG TB24 tablet Take 1 tablet (25 mg total) by mouth daily.   omeprazole (PRILOSEC) 20 MG capsule TAKE 1 CAPSULE (20 MG TOTAL) BY MOUTH DAILY. PLEASE SCHEDULE FOLLOW UP APPT WITH DR. Jonni Sanger FOR FURTHER REFILLS   PEDIATRIC MULTIVITAMINS-IRON PO Take 1 tablet by mouth daily in the afternoon. Chew gummie   [DISCONTINUED] donepezil (ARICEPT) 10 MG tablet TAKE 1 TABLET BY MOUTH EVERYDAY AT BEDTIME   [DISCONTINUED] memantine (NAMENDA TITRATION PACK) tablet pack TAKE AS DIRECTED ON TITRATION PAK   [DISCONTINUED] memantine (NAMENDA) 10 MG tablet Take 1 tablet (10 mg total) by mouth 2 (two) times daily.    Allergies: Patient is allergic to penicillins, nickel, avapro [irbesartan], lisinopril, simvastatin, latex, sulfa antibiotics, and sulfamethoxazole. Family History: Patient family history includes Cancer in her father; Diabetes in her mother; Heart disease in her father and mother; Hyperlipidemia in her mother; Hypertension in her mother. Social History:  Patient  reports that she quit smoking about 64 years ago. Her smoking use included cigarettes. She has never used smokeless tobacco. She reports that she does not currently use alcohol after a past usage of about 1.0 standard drink of alcohol per week. She reports that she does not use drugs.  Review of Systems: Constitutional: Negative for fever malaise or anorexia Cardiovascular: negative for chest pain Respiratory: negative for SOB or persistent cough Gastrointestinal: negative for abdominal pain  Objective  Vitals: BP 138/60   Pulse 69   Temp 97.8 F (36.6 C)   Ht 5\' 1"  (1.549 m)   Wt 151 lb 6.4 oz (68.7 kg)   SpO2 95%   BMI 28.61 kg/m  General: no acute distress , alert and pleasant HEENT: PEERL,  conjunctiva normal, neck is supple Cardiovascular:  RRR without murmur or gallop.  Respiratory:  Good breath sounds bilaterally, CTAB with normal respiratory effort, no rales Abdomen is soft and nontender, no suprapubic tenderness normal bowel sounds EXTR remedies without edema, possibly mild muscle tenderness throughout but hard to trust due to her dementia. Skin:  Warm, no rashes  Commons side effects, risks, benefits, and alternatives for medications and treatment plan prescribed today were discussed, and the patient expressed understanding of the given instructions. Patient is instructed to call or message via MyChart if he/she has any questions or concerns regarding our treatment plan. No barriers to understanding were identified. We discussed Red Flag symptoms and signs in detail. Patient expressed  understanding regarding what to do in case of urgent or emergency type symptoms.  Medication list was reconciled, printed and provided to the patient in AVS. Patient instructions and summary information was reviewed with the patient as documented in the AVS. This note was prepared with assistance of Dragon voice recognition software. Occasional wrong-word or sound-a-like substitutions may have occurred due to the inherent limitations of voice recognition software

## 2022-06-20 LAB — URINE CULTURE
MICRO NUMBER:: 14783130
SPECIMEN QUALITY:: ADEQUATE

## 2022-06-20 MED ORDER — CIPROFLOXACIN HCL 250 MG PO TABS: 250.0000 mg | ORAL_TABLET | Freq: Two times a day (BID) | ORAL | 0 refills | Status: AC

## 2022-06-20 NOTE — Addendum Note (Signed)
Addended by: Asencion Partridge on: 06/20/2022 11:03 AM   Modules accepted: Orders

## 2022-06-20 NOTE — Progress Notes (Signed)
Please call patient:let her daughter know that her urine looks infected so I have ordered antibiotics for her mom to take.  We are waiting on the final culture to ensure it is the correct antibiotic. That will return early next week. Please start the antibiotics now and we can see if this helps how her mom is feeling. The other lab results all look fine.

## 2022-07-11 DIAGNOSIS — I10 Essential (primary) hypertension: Secondary | ICD-10-CM | POA: Diagnosis not present

## 2022-07-11 DIAGNOSIS — Z96653 Presence of artificial knee joint, bilateral: Secondary | ICD-10-CM | POA: Diagnosis not present

## 2022-07-11 DIAGNOSIS — Z950 Presence of cardiac pacemaker: Secondary | ICD-10-CM | POA: Diagnosis not present

## 2022-07-11 DIAGNOSIS — Z87891 Personal history of nicotine dependence: Secondary | ICD-10-CM | POA: Diagnosis not present

## 2022-07-11 DIAGNOSIS — F0150 Vascular dementia without behavioral disturbance: Secondary | ICD-10-CM | POA: Diagnosis not present

## 2022-07-11 DIAGNOSIS — I4819 Other persistent atrial fibrillation: Secondary | ICD-10-CM | POA: Diagnosis not present

## 2022-07-11 DIAGNOSIS — Z7901 Long term (current) use of anticoagulants: Secondary | ICD-10-CM | POA: Diagnosis not present

## 2022-07-11 DIAGNOSIS — M791 Myalgia, unspecified site: Secondary | ICD-10-CM | POA: Diagnosis not present

## 2022-07-11 DIAGNOSIS — R269 Unspecified abnormalities of gait and mobility: Secondary | ICD-10-CM | POA: Diagnosis not present

## 2022-07-11 DIAGNOSIS — R531 Weakness: Secondary | ICD-10-CM | POA: Diagnosis not present

## 2022-07-14 ENCOUNTER — Telehealth: Payer: Self-pay | Admitting: Family Medicine

## 2022-07-14 NOTE — Telephone Encounter (Signed)
Home Health Verbal Orders  Agency:  [facility]   Caller: [place], [place]  [phone]   Requesting OT/ PT/ Skilled nursing/ Social Work/ Speech:  PT  Reason for Request:  Gait, Balance, and Strengthening   Frequency:  1 X 1 WEEK  2 X 2 WEEKS 1 X 4 WEEKS

## 2022-07-15 NOTE — Telephone Encounter (Signed)
Spoke with Liji PT to move forward with verbal orders.

## 2022-07-17 ENCOUNTER — Telehealth: Payer: Self-pay | Admitting: Family Medicine

## 2022-07-17 DIAGNOSIS — F0150 Vascular dementia without behavioral disturbance: Secondary | ICD-10-CM | POA: Diagnosis not present

## 2022-07-17 DIAGNOSIS — R531 Weakness: Secondary | ICD-10-CM | POA: Diagnosis not present

## 2022-07-17 DIAGNOSIS — I4819 Other persistent atrial fibrillation: Secondary | ICD-10-CM | POA: Diagnosis not present

## 2022-07-17 DIAGNOSIS — I10 Essential (primary) hypertension: Secondary | ICD-10-CM | POA: Diagnosis not present

## 2022-07-17 DIAGNOSIS — Z96653 Presence of artificial knee joint, bilateral: Secondary | ICD-10-CM | POA: Diagnosis not present

## 2022-07-17 DIAGNOSIS — M791 Myalgia, unspecified site: Secondary | ICD-10-CM | POA: Diagnosis not present

## 2022-07-17 DIAGNOSIS — Z7901 Long term (current) use of anticoagulants: Secondary | ICD-10-CM | POA: Diagnosis not present

## 2022-07-17 DIAGNOSIS — Z950 Presence of cardiac pacemaker: Secondary | ICD-10-CM | POA: Diagnosis not present

## 2022-07-17 DIAGNOSIS — R269 Unspecified abnormalities of gait and mobility: Secondary | ICD-10-CM | POA: Diagnosis not present

## 2022-07-17 DIAGNOSIS — Z87891 Personal history of nicotine dependence: Secondary | ICD-10-CM | POA: Diagnosis not present

## 2022-07-17 NOTE — Telephone Encounter (Signed)
Home Health Verbal Orders  Agency:  [facility]  Caller:  [place] and title  Reason for call:  BP was 170/80   [phone]

## 2022-07-21 DIAGNOSIS — Z950 Presence of cardiac pacemaker: Secondary | ICD-10-CM | POA: Diagnosis not present

## 2022-07-21 DIAGNOSIS — I4819 Other persistent atrial fibrillation: Secondary | ICD-10-CM | POA: Diagnosis not present

## 2022-07-21 DIAGNOSIS — F0150 Vascular dementia without behavioral disturbance: Secondary | ICD-10-CM | POA: Diagnosis not present

## 2022-07-21 DIAGNOSIS — Z7901 Long term (current) use of anticoagulants: Secondary | ICD-10-CM | POA: Diagnosis not present

## 2022-07-21 DIAGNOSIS — Z87891 Personal history of nicotine dependence: Secondary | ICD-10-CM | POA: Diagnosis not present

## 2022-07-21 DIAGNOSIS — R531 Weakness: Secondary | ICD-10-CM | POA: Diagnosis not present

## 2022-07-21 DIAGNOSIS — R269 Unspecified abnormalities of gait and mobility: Secondary | ICD-10-CM | POA: Diagnosis not present

## 2022-07-21 DIAGNOSIS — I10 Essential (primary) hypertension: Secondary | ICD-10-CM | POA: Diagnosis not present

## 2022-07-21 DIAGNOSIS — Z96653 Presence of artificial knee joint, bilateral: Secondary | ICD-10-CM | POA: Diagnosis not present

## 2022-07-21 DIAGNOSIS — M791 Myalgia, unspecified site: Secondary | ICD-10-CM | POA: Diagnosis not present

## 2022-07-23 ENCOUNTER — Telehealth: Payer: Self-pay | Admitting: Family Medicine

## 2022-07-23 DIAGNOSIS — Z87891 Personal history of nicotine dependence: Secondary | ICD-10-CM | POA: Diagnosis not present

## 2022-07-23 DIAGNOSIS — I4819 Other persistent atrial fibrillation: Secondary | ICD-10-CM | POA: Diagnosis not present

## 2022-07-23 DIAGNOSIS — Z950 Presence of cardiac pacemaker: Secondary | ICD-10-CM | POA: Diagnosis not present

## 2022-07-23 DIAGNOSIS — Z7901 Long term (current) use of anticoagulants: Secondary | ICD-10-CM | POA: Diagnosis not present

## 2022-07-23 DIAGNOSIS — I10 Essential (primary) hypertension: Secondary | ICD-10-CM | POA: Diagnosis not present

## 2022-07-23 DIAGNOSIS — R269 Unspecified abnormalities of gait and mobility: Secondary | ICD-10-CM | POA: Diagnosis not present

## 2022-07-23 DIAGNOSIS — R531 Weakness: Secondary | ICD-10-CM | POA: Diagnosis not present

## 2022-07-23 DIAGNOSIS — M791 Myalgia, unspecified site: Secondary | ICD-10-CM | POA: Diagnosis not present

## 2022-07-23 DIAGNOSIS — Z96653 Presence of artificial knee joint, bilateral: Secondary | ICD-10-CM | POA: Diagnosis not present

## 2022-07-23 DIAGNOSIS — F0150 Vascular dementia without behavioral disturbance: Secondary | ICD-10-CM | POA: Diagnosis not present

## 2022-07-23 NOTE — Telephone Encounter (Signed)
Form placed in Andy's box 

## 2022-07-23 NOTE — Telephone Encounter (Signed)
Received faxed document [facility] Certificate (Order ID [id number]), to be filled out by provider. Patient requested to send it via Fax [phone]. Document is located in providers tray at front office.Please advise a

## 2022-07-24 DIAGNOSIS — I4819 Other persistent atrial fibrillation: Secondary | ICD-10-CM | POA: Diagnosis not present

## 2022-07-24 DIAGNOSIS — Z950 Presence of cardiac pacemaker: Secondary | ICD-10-CM | POA: Diagnosis not present

## 2022-07-24 DIAGNOSIS — Z87891 Personal history of nicotine dependence: Secondary | ICD-10-CM | POA: Diagnosis not present

## 2022-07-24 DIAGNOSIS — Z96653 Presence of artificial knee joint, bilateral: Secondary | ICD-10-CM | POA: Diagnosis not present

## 2022-07-24 DIAGNOSIS — R531 Weakness: Secondary | ICD-10-CM | POA: Diagnosis not present

## 2022-07-24 DIAGNOSIS — F0150 Vascular dementia without behavioral disturbance: Secondary | ICD-10-CM | POA: Diagnosis not present

## 2022-07-24 DIAGNOSIS — Z7901 Long term (current) use of anticoagulants: Secondary | ICD-10-CM | POA: Diagnosis not present

## 2022-07-24 DIAGNOSIS — R269 Unspecified abnormalities of gait and mobility: Secondary | ICD-10-CM | POA: Diagnosis not present

## 2022-07-24 DIAGNOSIS — M791 Myalgia, unspecified site: Secondary | ICD-10-CM | POA: Diagnosis not present

## 2022-07-24 DIAGNOSIS — I10 Essential (primary) hypertension: Secondary | ICD-10-CM | POA: Diagnosis not present

## 2022-07-28 DIAGNOSIS — Z96653 Presence of artificial knee joint, bilateral: Secondary | ICD-10-CM | POA: Diagnosis not present

## 2022-07-28 DIAGNOSIS — R531 Weakness: Secondary | ICD-10-CM | POA: Diagnosis not present

## 2022-07-28 DIAGNOSIS — I4819 Other persistent atrial fibrillation: Secondary | ICD-10-CM | POA: Diagnosis not present

## 2022-07-28 DIAGNOSIS — Z87891 Personal history of nicotine dependence: Secondary | ICD-10-CM | POA: Diagnosis not present

## 2022-07-28 DIAGNOSIS — Z7901 Long term (current) use of anticoagulants: Secondary | ICD-10-CM | POA: Diagnosis not present

## 2022-07-28 DIAGNOSIS — R269 Unspecified abnormalities of gait and mobility: Secondary | ICD-10-CM | POA: Diagnosis not present

## 2022-07-28 DIAGNOSIS — Z950 Presence of cardiac pacemaker: Secondary | ICD-10-CM | POA: Diagnosis not present

## 2022-07-28 DIAGNOSIS — F0150 Vascular dementia without behavioral disturbance: Secondary | ICD-10-CM | POA: Diagnosis not present

## 2022-07-28 DIAGNOSIS — I10 Essential (primary) hypertension: Secondary | ICD-10-CM | POA: Diagnosis not present

## 2022-07-28 DIAGNOSIS — M791 Myalgia, unspecified site: Secondary | ICD-10-CM | POA: Diagnosis not present

## 2022-07-31 DIAGNOSIS — I10 Essential (primary) hypertension: Secondary | ICD-10-CM | POA: Diagnosis not present

## 2022-07-31 DIAGNOSIS — Z7901 Long term (current) use of anticoagulants: Secondary | ICD-10-CM | POA: Diagnosis not present

## 2022-07-31 DIAGNOSIS — R269 Unspecified abnormalities of gait and mobility: Secondary | ICD-10-CM | POA: Diagnosis not present

## 2022-07-31 DIAGNOSIS — Z950 Presence of cardiac pacemaker: Secondary | ICD-10-CM | POA: Diagnosis not present

## 2022-07-31 DIAGNOSIS — R531 Weakness: Secondary | ICD-10-CM | POA: Diagnosis not present

## 2022-07-31 DIAGNOSIS — F0150 Vascular dementia without behavioral disturbance: Secondary | ICD-10-CM | POA: Diagnosis not present

## 2022-07-31 DIAGNOSIS — M791 Myalgia, unspecified site: Secondary | ICD-10-CM | POA: Diagnosis not present

## 2022-07-31 DIAGNOSIS — Z87891 Personal history of nicotine dependence: Secondary | ICD-10-CM | POA: Diagnosis not present

## 2022-07-31 DIAGNOSIS — I4819 Other persistent atrial fibrillation: Secondary | ICD-10-CM | POA: Diagnosis not present

## 2022-07-31 DIAGNOSIS — Z96653 Presence of artificial knee joint, bilateral: Secondary | ICD-10-CM | POA: Diagnosis not present

## 2022-08-02 ENCOUNTER — Other Ambulatory Visit: Payer: Self-pay | Admitting: Family Medicine

## 2022-08-04 ENCOUNTER — Ambulatory Visit (INDEPENDENT_AMBULATORY_CARE_PROVIDER_SITE_OTHER): Payer: Medicare HMO

## 2022-08-04 DIAGNOSIS — I442 Atrioventricular block, complete: Secondary | ICD-10-CM | POA: Diagnosis not present

## 2022-08-05 LAB — CUP PACEART REMOTE DEVICE CHECK
Battery Remaining Longevity: 26 mo
Battery Remaining Percentage: 30 %
Battery Voltage: 2.93 V
Date Time Interrogation Session: 20240520020013
Implantable Lead Connection Status: 753985
Implantable Lead Connection Status: 753985
Implantable Lead Connection Status: 753985
Implantable Lead Implant Date: 20190219
Implantable Lead Implant Date: 20190219
Implantable Lead Implant Date: 20190219
Implantable Lead Location: 753858
Implantable Lead Location: 753859
Implantable Lead Location: 753860
Implantable Pulse Generator Implant Date: 20190219
Lead Channel Impedance Value: 630 Ohm
Lead Channel Impedance Value: 640 Ohm
Lead Channel Pacing Threshold Amplitude: 0.75 V
Lead Channel Pacing Threshold Amplitude: 1.625 V
Lead Channel Pacing Threshold Pulse Width: 0.5 ms
Lead Channel Pacing Threshold Pulse Width: 1 ms
Lead Channel Sensing Intrinsic Amplitude: 8.5 mV
Lead Channel Setting Pacing Amplitude: 2.125
Lead Channel Setting Pacing Amplitude: 2.5 V
Lead Channel Setting Pacing Pulse Width: 0.5 ms
Lead Channel Setting Pacing Pulse Width: 1 ms
Lead Channel Setting Sensing Sensitivity: 4 mV
Pulse Gen Model: 3262
Pulse Gen Serial Number: 8995296

## 2022-08-06 DIAGNOSIS — I10 Essential (primary) hypertension: Secondary | ICD-10-CM | POA: Diagnosis not present

## 2022-08-06 DIAGNOSIS — Z950 Presence of cardiac pacemaker: Secondary | ICD-10-CM | POA: Diagnosis not present

## 2022-08-06 DIAGNOSIS — F0150 Vascular dementia without behavioral disturbance: Secondary | ICD-10-CM | POA: Diagnosis not present

## 2022-08-06 DIAGNOSIS — R531 Weakness: Secondary | ICD-10-CM | POA: Diagnosis not present

## 2022-08-06 DIAGNOSIS — I4819 Other persistent atrial fibrillation: Secondary | ICD-10-CM | POA: Diagnosis not present

## 2022-08-06 DIAGNOSIS — M791 Myalgia, unspecified site: Secondary | ICD-10-CM | POA: Diagnosis not present

## 2022-08-06 DIAGNOSIS — Z96653 Presence of artificial knee joint, bilateral: Secondary | ICD-10-CM | POA: Diagnosis not present

## 2022-08-06 DIAGNOSIS — Z7901 Long term (current) use of anticoagulants: Secondary | ICD-10-CM | POA: Diagnosis not present

## 2022-08-06 DIAGNOSIS — R269 Unspecified abnormalities of gait and mobility: Secondary | ICD-10-CM | POA: Diagnosis not present

## 2022-08-06 DIAGNOSIS — Z87891 Personal history of nicotine dependence: Secondary | ICD-10-CM | POA: Diagnosis not present

## 2022-08-08 DIAGNOSIS — Z96653 Presence of artificial knee joint, bilateral: Secondary | ICD-10-CM | POA: Diagnosis not present

## 2022-08-08 DIAGNOSIS — R531 Weakness: Secondary | ICD-10-CM | POA: Diagnosis not present

## 2022-08-08 DIAGNOSIS — F0150 Vascular dementia without behavioral disturbance: Secondary | ICD-10-CM | POA: Diagnosis not present

## 2022-08-08 DIAGNOSIS — I4819 Other persistent atrial fibrillation: Secondary | ICD-10-CM | POA: Diagnosis not present

## 2022-08-08 DIAGNOSIS — I10 Essential (primary) hypertension: Secondary | ICD-10-CM | POA: Diagnosis not present

## 2022-08-08 DIAGNOSIS — Z7901 Long term (current) use of anticoagulants: Secondary | ICD-10-CM | POA: Diagnosis not present

## 2022-08-08 DIAGNOSIS — Z87891 Personal history of nicotine dependence: Secondary | ICD-10-CM | POA: Diagnosis not present

## 2022-08-08 DIAGNOSIS — Z950 Presence of cardiac pacemaker: Secondary | ICD-10-CM | POA: Diagnosis not present

## 2022-08-08 DIAGNOSIS — R269 Unspecified abnormalities of gait and mobility: Secondary | ICD-10-CM | POA: Diagnosis not present

## 2022-08-08 DIAGNOSIS — M791 Myalgia, unspecified site: Secondary | ICD-10-CM | POA: Diagnosis not present

## 2022-08-10 DIAGNOSIS — F0150 Vascular dementia without behavioral disturbance: Secondary | ICD-10-CM | POA: Diagnosis not present

## 2022-08-10 DIAGNOSIS — R531 Weakness: Secondary | ICD-10-CM | POA: Diagnosis not present

## 2022-08-10 DIAGNOSIS — I4819 Other persistent atrial fibrillation: Secondary | ICD-10-CM | POA: Diagnosis not present

## 2022-08-10 DIAGNOSIS — M791 Myalgia, unspecified site: Secondary | ICD-10-CM | POA: Diagnosis not present

## 2022-08-10 DIAGNOSIS — R269 Unspecified abnormalities of gait and mobility: Secondary | ICD-10-CM | POA: Diagnosis not present

## 2022-08-10 DIAGNOSIS — I10 Essential (primary) hypertension: Secondary | ICD-10-CM | POA: Diagnosis not present

## 2022-08-10 DIAGNOSIS — Z950 Presence of cardiac pacemaker: Secondary | ICD-10-CM | POA: Diagnosis not present

## 2022-08-10 DIAGNOSIS — Z96653 Presence of artificial knee joint, bilateral: Secondary | ICD-10-CM | POA: Diagnosis not present

## 2022-08-10 DIAGNOSIS — Z87891 Personal history of nicotine dependence: Secondary | ICD-10-CM | POA: Diagnosis not present

## 2022-08-10 DIAGNOSIS — Z7901 Long term (current) use of anticoagulants: Secondary | ICD-10-CM | POA: Diagnosis not present

## 2022-08-12 ENCOUNTER — Telehealth: Payer: Self-pay | Admitting: Family Medicine

## 2022-08-12 NOTE — Telephone Encounter (Signed)
LVM for Madison White to move forward with the verbal orders for pt.

## 2022-08-12 NOTE — Telephone Encounter (Signed)
Home Health Verbal Orders  Agency:  [facility]  Caller: Monique  Contact and title  Ph# [phone] - PT  Requesting  PT:  1 x per week for 4 more weeks  Reason for Request:  Continued Physical Therapy  Frequency:   1 x per week for 4 more weeks  HH needs F2F w/in last 30 days

## 2022-08-20 DIAGNOSIS — I10 Essential (primary) hypertension: Secondary | ICD-10-CM | POA: Diagnosis not present

## 2022-08-20 DIAGNOSIS — Z96653 Presence of artificial knee joint, bilateral: Secondary | ICD-10-CM | POA: Diagnosis not present

## 2022-08-20 DIAGNOSIS — R531 Weakness: Secondary | ICD-10-CM | POA: Diagnosis not present

## 2022-08-20 DIAGNOSIS — Z7901 Long term (current) use of anticoagulants: Secondary | ICD-10-CM | POA: Diagnosis not present

## 2022-08-20 DIAGNOSIS — Z87891 Personal history of nicotine dependence: Secondary | ICD-10-CM | POA: Diagnosis not present

## 2022-08-20 DIAGNOSIS — I4819 Other persistent atrial fibrillation: Secondary | ICD-10-CM | POA: Diagnosis not present

## 2022-08-20 DIAGNOSIS — R269 Unspecified abnormalities of gait and mobility: Secondary | ICD-10-CM | POA: Diagnosis not present

## 2022-08-20 DIAGNOSIS — M791 Myalgia, unspecified site: Secondary | ICD-10-CM | POA: Diagnosis not present

## 2022-08-20 DIAGNOSIS — F0150 Vascular dementia without behavioral disturbance: Secondary | ICD-10-CM | POA: Diagnosis not present

## 2022-08-20 DIAGNOSIS — Z950 Presence of cardiac pacemaker: Secondary | ICD-10-CM | POA: Diagnosis not present

## 2022-08-29 DIAGNOSIS — Z7901 Long term (current) use of anticoagulants: Secondary | ICD-10-CM | POA: Diagnosis not present

## 2022-08-29 DIAGNOSIS — I10 Essential (primary) hypertension: Secondary | ICD-10-CM | POA: Diagnosis not present

## 2022-08-29 DIAGNOSIS — R269 Unspecified abnormalities of gait and mobility: Secondary | ICD-10-CM | POA: Diagnosis not present

## 2022-08-29 DIAGNOSIS — Z96653 Presence of artificial knee joint, bilateral: Secondary | ICD-10-CM | POA: Diagnosis not present

## 2022-08-29 DIAGNOSIS — F0150 Vascular dementia without behavioral disturbance: Secondary | ICD-10-CM | POA: Diagnosis not present

## 2022-08-29 DIAGNOSIS — I4819 Other persistent atrial fibrillation: Secondary | ICD-10-CM | POA: Diagnosis not present

## 2022-08-29 DIAGNOSIS — Z87891 Personal history of nicotine dependence: Secondary | ICD-10-CM | POA: Diagnosis not present

## 2022-08-29 DIAGNOSIS — R531 Weakness: Secondary | ICD-10-CM | POA: Diagnosis not present

## 2022-08-29 DIAGNOSIS — M791 Myalgia, unspecified site: Secondary | ICD-10-CM | POA: Diagnosis not present

## 2022-08-29 DIAGNOSIS — Z950 Presence of cardiac pacemaker: Secondary | ICD-10-CM | POA: Diagnosis not present

## 2022-09-01 DIAGNOSIS — I10 Essential (primary) hypertension: Secondary | ICD-10-CM | POA: Diagnosis not present

## 2022-09-01 DIAGNOSIS — F0150 Vascular dementia without behavioral disturbance: Secondary | ICD-10-CM | POA: Diagnosis not present

## 2022-09-01 DIAGNOSIS — Z950 Presence of cardiac pacemaker: Secondary | ICD-10-CM | POA: Diagnosis not present

## 2022-09-01 DIAGNOSIS — Z96653 Presence of artificial knee joint, bilateral: Secondary | ICD-10-CM | POA: Diagnosis not present

## 2022-09-01 DIAGNOSIS — I4819 Other persistent atrial fibrillation: Secondary | ICD-10-CM | POA: Diagnosis not present

## 2022-09-01 DIAGNOSIS — R531 Weakness: Secondary | ICD-10-CM | POA: Diagnosis not present

## 2022-09-01 DIAGNOSIS — M791 Myalgia, unspecified site: Secondary | ICD-10-CM | POA: Diagnosis not present

## 2022-09-01 DIAGNOSIS — Z87891 Personal history of nicotine dependence: Secondary | ICD-10-CM | POA: Diagnosis not present

## 2022-09-01 DIAGNOSIS — R269 Unspecified abnormalities of gait and mobility: Secondary | ICD-10-CM | POA: Diagnosis not present

## 2022-09-01 DIAGNOSIS — Z7901 Long term (current) use of anticoagulants: Secondary | ICD-10-CM | POA: Diagnosis not present

## 2022-09-01 NOTE — Progress Notes (Signed)
Remote pacemaker transmission.   

## 2022-10-08 ENCOUNTER — Ambulatory Visit: Payer: Medicare HMO | Admitting: Family Medicine

## 2022-10-31 DIAGNOSIS — F0150 Vascular dementia without behavioral disturbance: Secondary | ICD-10-CM | POA: Diagnosis not present

## 2022-10-31 DIAGNOSIS — K219 Gastro-esophageal reflux disease without esophagitis: Secondary | ICD-10-CM | POA: Diagnosis not present

## 2022-10-31 DIAGNOSIS — I429 Cardiomyopathy, unspecified: Secondary | ICD-10-CM | POA: Diagnosis not present

## 2022-10-31 DIAGNOSIS — Z87891 Personal history of nicotine dependence: Secondary | ICD-10-CM | POA: Diagnosis not present

## 2022-10-31 DIAGNOSIS — G309 Alzheimer's disease, unspecified: Secondary | ICD-10-CM | POA: Diagnosis not present

## 2022-10-31 DIAGNOSIS — R32 Unspecified urinary incontinence: Secondary | ICD-10-CM | POA: Diagnosis not present

## 2022-10-31 DIAGNOSIS — E785 Hyperlipidemia, unspecified: Secondary | ICD-10-CM | POA: Diagnosis not present

## 2022-10-31 DIAGNOSIS — M545 Low back pain, unspecified: Secondary | ICD-10-CM | POA: Diagnosis not present

## 2022-10-31 DIAGNOSIS — M1990 Unspecified osteoarthritis, unspecified site: Secondary | ICD-10-CM | POA: Diagnosis not present

## 2022-10-31 DIAGNOSIS — I10 Essential (primary) hypertension: Secondary | ICD-10-CM | POA: Diagnosis not present

## 2022-10-31 DIAGNOSIS — R269 Unspecified abnormalities of gait and mobility: Secondary | ICD-10-CM | POA: Diagnosis not present

## 2022-10-31 DIAGNOSIS — I4891 Unspecified atrial fibrillation: Secondary | ICD-10-CM | POA: Diagnosis not present

## 2022-11-03 ENCOUNTER — Ambulatory Visit (INDEPENDENT_AMBULATORY_CARE_PROVIDER_SITE_OTHER): Payer: Medicare HMO

## 2022-11-03 DIAGNOSIS — I442 Atrioventricular block, complete: Secondary | ICD-10-CM | POA: Diagnosis not present

## 2022-11-03 LAB — CUP PACEART REMOTE DEVICE CHECK
Battery Remaining Longevity: 24 mo
Battery Remaining Percentage: 27 %
Battery Voltage: 2.93 V
Date Time Interrogation Session: 20240819020015
Implantable Lead Connection Status: 753985
Implantable Lead Connection Status: 753985
Implantable Lead Connection Status: 753985
Implantable Lead Implant Date: 20190219
Implantable Lead Implant Date: 20190219
Implantable Lead Implant Date: 20190219
Implantable Lead Location: 753858
Implantable Lead Location: 753859
Implantable Lead Location: 753860
Implantable Pulse Generator Implant Date: 20190219
Lead Channel Impedance Value: 610 Ohm
Lead Channel Impedance Value: 610 Ohm
Lead Channel Pacing Threshold Amplitude: 0.75 V
Lead Channel Pacing Threshold Amplitude: 1.25 V
Lead Channel Pacing Threshold Pulse Width: 0.5 ms
Lead Channel Pacing Threshold Pulse Width: 1 ms
Lead Channel Sensing Intrinsic Amplitude: 8.4 mV
Lead Channel Setting Pacing Amplitude: 2 V
Lead Channel Setting Pacing Amplitude: 2.5 V
Lead Channel Setting Pacing Pulse Width: 0.5 ms
Lead Channel Setting Pacing Pulse Width: 1 ms
Lead Channel Setting Sensing Sensitivity: 4 mV
Pulse Gen Model: 3262
Pulse Gen Serial Number: 8995296

## 2022-11-04 ENCOUNTER — Emergency Department (HOSPITAL_BASED_OUTPATIENT_CLINIC_OR_DEPARTMENT_OTHER): Payer: Medicare HMO

## 2022-11-04 ENCOUNTER — Other Ambulatory Visit: Payer: Self-pay

## 2022-11-04 ENCOUNTER — Emergency Department (HOSPITAL_BASED_OUTPATIENT_CLINIC_OR_DEPARTMENT_OTHER)
Admission: EM | Admit: 2022-11-04 | Discharge: 2022-11-04 | Disposition: A | Discharge status: Home / Self Care | Payer: Medicare HMO | Attending: Emergency Medicine | Admitting: Emergency Medicine

## 2022-11-04 DIAGNOSIS — M545 Low back pain, unspecified: Secondary | ICD-10-CM | POA: Insufficient documentation

## 2022-11-04 DIAGNOSIS — K5730 Diverticulosis of large intestine without perforation or abscess without bleeding: Secondary | ICD-10-CM | POA: Diagnosis not present

## 2022-11-04 DIAGNOSIS — M5459 Other low back pain: Secondary | ICD-10-CM | POA: Diagnosis not present

## 2022-11-04 DIAGNOSIS — G8929 Other chronic pain: Secondary | ICD-10-CM | POA: Insufficient documentation

## 2022-11-04 DIAGNOSIS — R109 Unspecified abdominal pain: Secondary | ICD-10-CM | POA: Diagnosis not present

## 2022-11-04 DIAGNOSIS — N2889 Other specified disorders of kidney and ureter: Secondary | ICD-10-CM | POA: Diagnosis not present

## 2022-11-04 LAB — BASIC METABOLIC PANEL
Anion gap: 8 (ref 5–15)
BUN: 10 mg/dL (ref 8–23)
CO2: 27 mmol/L (ref 22–32)
Calcium: 8.9 mg/dL (ref 8.9–10.3)
Chloride: 104 mmol/L (ref 98–111)
Creatinine, Ser: 0.84 mg/dL (ref 0.44–1.00)
GFR, Estimated: >60 mL/min (ref >60)
Glucose, Bld: 127 mg/dL — ABNORMAL HIGH (ref 70–99)
Potassium: 3.8 mmol/L (ref 3.5–5.1)
Sodium: 139 mmol/L (ref 135–145)

## 2022-11-04 LAB — URINALYSIS, ROUTINE W REFLEX MICROSCOPIC
Bilirubin Urine: NEGATIVE
Glucose, UA: NEGATIVE mg/dL
Hgb urine dipstick: NEGATIVE
Ketones, ur: NEGATIVE mg/dL
Leukocytes,Ua: NEGATIVE
Nitrite: NEGATIVE
Specific Gravity, Urine: 1.024 (ref 1.005–1.030)
pH: 5 (ref 5.0–8.0)

## 2022-11-04 LAB — CBC
HCT: 42.2 % (ref 36.0–46.0)
Hemoglobin: 14 g/dL (ref 12.0–15.0)
MCH: 29.8 pg (ref 26.0–34.0)
MCHC: 33.2 g/dL (ref 30.0–36.0)
MCV: 89.8 fL (ref 80.0–100.0)
Platelets: 307 10*3/uL (ref 150–400)
RBC: 4.7 MIL/uL (ref 3.87–5.11)
RDW: 13.4 % (ref 11.5–15.5)
WBC: 8.3 10*3/uL (ref 4.0–10.5)
nRBC: 0 % (ref 0.0–0.2)

## 2022-11-04 MED ORDER — OXYCODONE-ACETAMINOPHEN 5-325 MG PO TABS
1.0000 | ORAL_TABLET | ORAL | Status: DC | PRN
  Administered 2022-11-04: 1 via ORAL
  Filled 2022-11-04: qty 1

## 2022-11-04 MED ORDER — IOHEXOL 300 MG/ML  SOLN
100.0000 mL | Freq: Once | INTRAMUSCULAR | Status: AC | PRN
  Administered 2022-11-04: 85 mL via INTRAVENOUS

## 2022-11-04 NOTE — ED Provider Notes (Signed)
Madison White   CSN: [id number] Arrival date & time: 11/04/22  1502     History Chief Complaint  Patient presents with   Back Pain    Madison White is a 89 y.o. female.  Patient with past history significant for chronic back pain presents to the emergency department concerns of low back pain.  Reports has been worsening over the last several days and concerned about possible kidney stones due to prior stones and currently endorsing some dysuria.  Currently denies any chest pain, shortness of breath, nausea, vomiting, diarrhea.  No recent incident or injury to account for patient's back pain.  Has tried managing with aspirin at home with some mild improvement.   Back Pain      Home Medications Prior to Admission medications   Medication Sig Start Date End Date Taking? Authorizing Provider  apixaban (ELIQUIS) 5 MG TABS tablet Take 1 tablet (5 mg total) by mouth 2 (two) times daily. 04/09/22   Willow Ora, MD  calcium-vitamin D (OSCAL WITH D) 500-200 MG-UNIT tablet Take 1 tablet by mouth.    [provider]  Cholecalciferol 25 MCG (1000 UT) tablet Take 1,000 Units by mouth daily.    [provider]  cyanocobalamin 100 MCG tablet Take by mouth daily.    [provider]  donepezil (ARICEPT) 10 MG tablet TAKE 1 TABLET BY MOUTH EVERYDAY AT BEDTIME 06/19/22   Willow Ora, MD  ELDERBERRY PO Take 1 tablet by mouth daily in the afternoon. Chew Gummie    [provider]  Iron-Vitamin C 100-250 MG TABS Take by mouth.    [provider]  metoprolol tartrate (LOPRESSOR) 25 MG tablet Take 0.5 tablets (12.5 mg total) by mouth 2 (two) times daily. 01/27/22   Croitoru, Mihai, MD  MYRBETRIQ 25 MG TB24 tablet TAKE 1 TABLET (25 MG TOTAL) BY MOUTH DAILY. 08/04/22   Willow Ora, MD  omeprazole (PRILOSEC) 20 MG capsule TAKE 1 CAPSULE (20 MG TOTAL) BY MOUTH DAILY. PLEASE SCHEDULE FOLLOW UP APPT WITH  DR. Mardelle Matte FOR FURTHER REFILLS 05/01/20   Willow Ora, MD  PEDIATRIC MULTIVITAMINS-IRON PO Take 1 tablet by mouth daily in the afternoon. Chew gummie    [provider]      Allergies    Penicillins, Nickel, Avapro [irbesartan], Lisinopril, Simvastatin, Latex, Sulfa antibiotics, and Sulfamethoxazole    Review of Systems   Review of Systems  Musculoskeletal:  Positive for back pain.  All other systems reviewed and are negative.   Physical Exam Updated Vital Signs BP (!) 170/82   Pulse 70   Temp 98 F (36.7 C) (Oral)   Resp 16   Wt 68 kg   SpO2 95%   BMI 28.34 kg/m  Physical Exam Vitals and nursing White reviewed.  Constitutional:      General: She is not in acute distress.    Appearance: She is well-developed.  HENT:     Head: Normocephalic and atraumatic.  Eyes:     Conjunctiva/sclera: Conjunctivae normal.  Cardiovascular:     Rate and Rhythm: Normal rate and regular rhythm.     Heart sounds: No murmur heard. Pulmonary:     Effort: Pulmonary effort is normal. No respiratory distress.     Breath sounds: Normal breath sounds.  Abdominal:     Palpations: Abdomen is soft.     Tenderness: There is no abdominal tenderness. There is no right CVA tenderness or left CVA tenderness.  Musculoskeletal:        General: No swelling, tenderness, deformity or signs of injury. Normal range of motion.     Cervical back: Neck supple.     Comments: No significant tenderness in the paraspinal muscles or in the midline spine.  Skin:    General: Skin is warm and dry.     Capillary Refill: Capillary refill takes less than 2 seconds.  Neurological:     Mental Status: She is alert.  Psychiatric:        Mood and Affect: Mood normal.     ED Results / Procedures / Treatments   Labs (all labs ordered are listed, but only abnormal results are displayed) Labs Reviewed  URINALYSIS, ROUTINE W REFLEX MICROSCOPIC - Abnormal; Notable for the following components:      Result Value    Protein, ur TRACE (*)    All other components within normal limits  BASIC METABOLIC PANEL - Abnormal; Notable for the following components:   Glucose, Bld 127 (*)    All other components within normal limits  CBC    EKG None  Radiology No results found.  Procedures Procedures    Medications Ordered in ED Medications  iohexol (OMNIPAQUE) 300 MG/ML solution 100 mL (85 mLs Intravenous Contrast Given 11/04/22 1826)    ED Course/ Medical Decision Making/ A&P                                 Medical Decision Making Amount and/or Complexity of Data Reviewed Labs: ordered. Radiology: ordered.  Risk Prescription drug management.   This patient presents to the ED for concern of back pain.  Differential diagnosis includes pyelonephritis, lumbar spine fracture, diverticulitis, bowel obstruction   Lab Tests:  I Ordered, and personally interpreted labs.  The pertinent results include: CBC and BMP unremarkable, UA without obvious signs of infection   Imaging Studies ordered:  I ordered imaging studies including CT abdomen pelvis I independently visualized and interpreted imaging which showed no acute abdominal abnormality.  Incidental lung nodule noted in the right lung has grown from 6 mm to 11 mm. I agree with the radiologist interpretation   Medicines ordered and prescription drug management:  I ordered medication including Percocet for pain Reevaluation of the patient after these medicines showed that the patient improved I have reviewed the patients home medicines and have made adjustments as needed   Problem List / ED Course:  Patient presented to the ED with concerns of back pain. Concerned about possible kidney stone as she is reporting pain in her right flank with some associated dysuria. States that this has been worsening over the last few days. Patient's daughter is concerned that this is not improving with time and also concerned for stone. Patient's vitals are  nonconcerning on initial assessment.  Patient does have some notable right sided CVA tenderness which appears to move towards the right side abdomen.  Basic labs including CBC, BMP ordered for evaluation of symptoms and are unremarkable.  Patient's UA is negative for signs of infection.  Will proceed with CT abdomen pelvis for further evaluation. CT abdomen pelvis negative for any acute findings such as nephrolithiasis or urolithiasis.  No evidence of pyelonephritis or other acute abdominal pathology. Patient's pain improved and feels comfortable moving which is significantly compared to initial arrival per daughter. Will plan on discharging patient home with close PCP follow up. Strict return precautions discussed. All questions answered  prior to patient discharge.  Final Clinical Impression(s) / ED Diagnoses Final diagnoses:  Chronic bilateral low back pain without sciatica    Rx / DC Orders ED Discharge Orders     None         Smitty Knudsen, PA-C 11/07/22 0045    Laurence Spates, MD 11/07/22 [phone]

## 2022-11-04 NOTE — Discharge Instructions (Signed)
You are seen in the emergency department today for back pain.  Thankfully your labs and imaging are negative for any acute cause to your pain.  I would advise following up with your primary care provider for repeat evaluation to ensure your symptoms are improving.  In the meantime, I would advise taking Tylenol, Aleve, or Advil if able to for pain.  If there is any reason you cannot take these medications, please reach out to your primary care provider.  If symptoms are worsening, please return to the emergency department.

## 2022-11-04 NOTE — ED Triage Notes (Signed)
Lower back pain- chronic. Worsening over past several days. Concerned for kidney stones due to history. Dysuria today. -CP, -SOB, -N/-V.   HX metoprolol, eliquis-afib, pacemaker, and myrbetriq.

## 2022-11-11 ENCOUNTER — Telehealth: Payer: Self-pay

## 2022-11-11 NOTE — Telephone Encounter (Signed)
Transition Care Management Unsuccessful Follow-up Telephone Call  Date of discharge and from where:  [facility] 8/20  Attempts:  1st Attempt  Reason for unsuccessful TCM follow-up call:  No answer/busy   Madison White  [facility], [facility], Phone: [phone] Website: [email or web address]

## 2022-11-12 ENCOUNTER — Telehealth: Payer: Self-pay

## 2022-11-12 NOTE — Telephone Encounter (Signed)
Transition Care Management Follow-up Telephone Call Date of discharge and from where: [facility] 8/20 How have you been since you were released from the hospital? Doing better  Any questions or concerns? No  Items Reviewed: Did the pt receive and understand the discharge instructions provided? Yes  Medications obtained and verified? No  Other? No  Any new allergies since your discharge? No  Dietary orders reviewed? No Do you have support at home? Yes     Follow up appointments reviewed:  PCP Hospital f/u appt confirmed? No  Scheduled to see  on  @  [facility] f/u appt confirmed? No  Scheduled to see  on  @ . Are transportation arrangements needed? No  If their condition worsens, is the pt aware to call PCP or go to the Emergency Dept.? Yes Was the patient provided with contact information for the PCP's office or ED? Yes Was to pt encouraged to call back with questions or concerns? Yes

## 2022-11-13 NOTE — Progress Notes (Signed)
Remote pacemaker transmission.   

## 2022-12-03 DIAGNOSIS — L821 Other seborrheic keratosis: Secondary | ICD-10-CM | POA: Insufficient documentation

## 2022-12-03 DIAGNOSIS — I781 Nevus, non-neoplastic: Secondary | ICD-10-CM | POA: Insufficient documentation

## 2022-12-06 ENCOUNTER — Other Ambulatory Visit: Payer: Self-pay | Admitting: Family Medicine

## 2023-02-02 ENCOUNTER — Ambulatory Visit (INDEPENDENT_AMBULATORY_CARE_PROVIDER_SITE_OTHER): Payer: Medicare HMO

## 2023-02-02 DIAGNOSIS — I428 Other cardiomyopathies: Secondary | ICD-10-CM | POA: Diagnosis not present

## 2023-02-02 DIAGNOSIS — I442 Atrioventricular block, complete: Secondary | ICD-10-CM

## 2023-02-03 LAB — CUP PACEART REMOTE DEVICE CHECK
Battery Remaining Longevity: 22 mo
Battery Remaining Percentage: 24 %
Battery Voltage: 2.92 V
Date Time Interrogation Session: 20241118223039
Implantable Lead Connection Status: 753985
Implantable Lead Connection Status: 753985
Implantable Lead Connection Status: 753985
Implantable Lead Implant Date: 20190219
Implantable Lead Implant Date: 20190219
Implantable Lead Implant Date: 20190219
Implantable Lead Location: 753858
Implantable Lead Location: 753859
Implantable Lead Location: 753860
Implantable Pulse Generator Implant Date: 20190219
Lead Channel Impedance Value: 580 Ohm
Lead Channel Impedance Value: 730 Ohm
Lead Channel Pacing Threshold Amplitude: 0.75 V
Lead Channel Pacing Threshold Amplitude: 1.125 V
Lead Channel Pacing Threshold Pulse Width: 0.5 ms
Lead Channel Pacing Threshold Pulse Width: 1 ms
Lead Channel Sensing Intrinsic Amplitude: 12 mV
Lead Channel Setting Pacing Amplitude: 2 V
Lead Channel Setting Pacing Amplitude: 2.5 V
Lead Channel Setting Pacing Pulse Width: 0.5 ms
Lead Channel Setting Pacing Pulse Width: 1 ms
Lead Channel Setting Sensing Sensitivity: 4 mV
Pulse Gen Model: 3262
Pulse Gen Serial Number: 8995296

## 2023-02-09 ENCOUNTER — Ambulatory Visit: Payer: Medicare HMO

## 2023-02-09 VITALS — Wt 150.0 lb

## 2023-02-09 DIAGNOSIS — Z0000 Encounter for general adult medical examination without abnormal findings: Secondary | ICD-10-CM

## 2023-02-09 NOTE — Patient Instructions (Signed)
Madison White , Thank you for taking time to come for your Medicare Wellness Visit. I appreciate your ongoing commitment to your health goals. Please review the following plan we discussed and let me know if I can assist you in the future.   Referrals/Orders/Follow-Ups/Clinician Recommendations: Each day, aim for 6 glasses of water, plenty of protein in your diet and try to get up and walk/ stretch every hour for 5-10 minutes at a time.    This is a list of the screening recommended for you and due dates:  Health Maintenance  Topic Date Due   DTaP/Tdap/Td vaccine (2 - Td or Tdap) 09/07/2021   Flu Shot  06/15/2023*   Medicare Annual Wellness Visit  02/09/2024   Pneumonia Vaccine  Completed   HPV Vaccine  Aged Out   DEXA scan (bone density measurement)  Discontinued   COVID-19 Vaccine  Discontinued   Zoster (Shingles) Vaccine  Discontinued  *Topic was postponed. The date shown is not the original due date.    Advanced directives: (Copy Requested) Please bring a copy of your health care power of attorney and living will to the office to be added to your chart at your convenience.  Next Medicare Annual Wellness Visit scheduled for next year: Yes

## 2023-02-09 NOTE — Progress Notes (Signed)
Subjective:   Madison White is a 89 y.o. female who presents for an Initial Medicare Annual Wellness Visit.  Visit Complete: Virtual I connected with  [name] on 02/09/23 by a audio enabled telemedicine application and verified that I am speaking with the correct person using two identifiers.  Patient Location: Home  Provider Location: Office/Clinic  I discussed the limitations of evaluation and management by telemedicine. The patient expressed understanding and agreed to proceed.  Vital Signs: Because this visit was a virtual/telehealth visit, some criteria may be missing or patient reported. Any vitals not documented were not able to be obtained and vitals that have been documented are patient reported.   Cardiac Risk Factors include: advanced age (>68men, >88 women);dyslipidemia;hypertension     Objective:    Today's Vitals   02/09/23 1134  Weight: 150 lb (68 kg)   Body mass index is 28.34 kg/m.     02/09/2023   11:41 AM 11/04/2022    3:36 PM 03/11/2021    2:37 PM 02/27/2020    2:48 PM 10/24/2019    9:09 PM 08/09/2019    2:29 PM 01/24/2019    2:44 PM  Advanced Directives  Does Patient Have a Medical Advance Directive? Yes No No Yes No No Yes  Type of Estate agent of [place];Living will   Healthcare Power of [place];Living will   Healthcare Power of [place];Living will  Does patient want to make changes to medical advance directive?       No - Patient declined  Copy of Healthcare Power of Attorney in Chart? No - copy requested   No - copy requested   No - copy requested  Would patient like information on creating a medical advance directive?   No - Patient declined   Yes (MAU/Ambulatory/Procedural Areas - Information given)     Current Medications (verified) Outpatient Encounter Medications as of 02/09/2023  Medication Sig   apixaban (ELIQUIS) 5 MG TABS tablet Take 1 tablet (5 mg total) by mouth 2 (two) times daily.   calcium-vitamin  D (OSCAL WITH D) 500-200 MG-UNIT tablet Take 1 tablet by mouth.   Cholecalciferol 25 MCG (1000 UT) tablet Take 1,000 Units by mouth daily.   cyanocobalamin 100 MCG tablet Take by mouth daily.   donepezil (ARICEPT) 10 MG tablet TAKE 1 TABLET BY MOUTH EVERYDAY AT BEDTIME   ELDERBERRY PO Take 1 tablet by mouth daily in the afternoon. Chew Gummie   Iron-Vitamin C 100-250 MG TABS Take by mouth.   metoprolol tartrate (LOPRESSOR) 25 MG tablet TAKE 1 TABLET BY MOUTH TWICE A DAY (Patient taking differently: Take 12.5 mg by mouth 2 (two) times daily.)   MYRBETRIQ 25 MG TB24 tablet TAKE 1 TABLET (25 MG TOTAL) BY MOUTH DAILY.   omeprazole (PRILOSEC) 20 MG capsule TAKE 1 CAPSULE (20 MG TOTAL) BY MOUTH DAILY. PLEASE SCHEDULE FOLLOW UP APPT WITH DR. Mardelle Matte FOR FURTHER REFILLS   PEDIATRIC MULTIVITAMINS-IRON PO Take 1 tablet by mouth daily in the afternoon. Chew gummie   No facility-administered encounter medications on file as of 02/09/2023.    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   History of blood transfusion    History of rheumatic fever    HTN (hypertension)    hx of refusing treatment--White coat HTN and/or situational HTN (?)    Hyperlipidemia    hx of refusing treatment   Insulin resistance    A1c's excellent per old records (6.1 in 2008 and 2009)   Left bundle branch block 12/2011   Dr. Royann Shivers at [facility]; ECHO and myocardial perfusion scan showed  septal and apical wall  motion abnormality but she had no significant valvular disease and no ischemia.  She did have mildly decreased EF (39% on lexiscan and 50% on echo) and abnl LV relaxation.  Mild amount of PVCs.  Pt at higher risk for other conduction abnormalities, good chance of eventually requiring a pacemaker.    LVH (left ventricular hypertrophy)    Nonischemic cardiomyopathy (HCC) 2016   LV dysfunction due to LBBB/septal dyssynchrony   Osteoarthritis    bilat knees--needs bilat TKA.  Ortho is trying steroid injections as of 10/23/15.   Presence of biventricular cardiac pacemaker 04/05/2019   Presence of permanent cardiac pacemaker 05/05/2017   Seasonal allergies    Solitary pulmonary nodule 05/2016   8 mm pleural based nodule in RLL.  Radiology recommended f/u noncontrast CT in 6-12 mo.   Past Surgical History:  Procedure Laterality Date   ABDOMINAL HYSTERECTOMY  1963   At the time of her last C/S; says she had partial bladder resection at that time as well   BI-VENTRICULAR PACEMAKER INSERTION (CRT-P)  05/05/2017   BIV PACEMAKER INSERTION CRT-P N/A 05/05/2017   Procedure: BIV PACEMAKER INSERTION CRT-P;  Surgeon: Marinus Maw, MD;  Location: [facility] INVASIVE CV LAB;  Service: Cardiovascular;  Laterality: N/A;   CARDIOVASCULAR STRESS TEST  08/2005 & 12/2011   Low risk scans (on the 12/2011 scan she did have EF 39% with moderately severe LV dysfunction with septal dyssynergy probably contributed by LBBB   CARDIOVERSION N/A 08/09/2019   Procedure: CARDIOVERSION;  Surgeon: Chrystie Nose, MD;  Location[facility] ENDOSCOPY;  Service: Cardiovascular;  Laterality: N/A;   Carotid dopplers  11/22/14; 11/30/16   NORMAL 2016 and 2018   CATARACT EXTRACTION  08/14/11   both   CESAREAN SECTION  X 5   One of her neonates died soon after birth   CHOLECYSTECTOMY     1980's   COLONOSCOPY WITH PROPOFOL N/A 09/02/2017   Procedure: COLONOSCOPY WITH PROPOFOL;  Surgeon: Meryl Dare, MD;  Location: [facility] ENDOSCOPY;  Service: Endoscopy;   Laterality: N/A;   HOT HEMOSTASIS N/A 09/02/2017   Procedure: HOT HEMOSTASIS (ARGON PLASMA COAGULATION/BICAP);  Surgeon: Meryl Dare, MD;  Location: [facility] ENDOSCOPY;  Service: Endoscopy;  Laterality: N/A;   INSERT / REPLACE / REMOVE PACEMAKER     JOINT REPLACEMENT Bilateral    knee   right knee surgery Right 05/2016   ? R TKA: no records.   STRABISMUS SURGERY  1939   TONSILLECTOMY  1938   TRANSTHORACIC ECHOCARDIOGRAM  01/13/12   Septal dyssynergy, EF 50%, LV relaxation impaired.  No significant valvular abnormalities.   Family History  Problem Relation Age of Onset   Hypertension Mother    Heart disease Mother    Hyperlipidemia Mother    Diabetes Mother    Heart disease Father    Cancer Father    Social History   Socioeconomic History  Marital status: Widowed    Spouse name: Not on file   Number of children: Not on file   Years of education: Not on file   Highest education level: Not on file  Occupational History   Occupation: retired   Tobacco Use   Smoking status: Former    Current packs/day: 0.00    Types: Cigarettes    Quit date: 05/31/1958    Years since quitting: 64.7   Smokeless tobacco: Never  Vaping Use   Vaping status: Never Used  Substance and Sexual Activity   Alcohol use: Not Currently    Alcohol/week: 1.0 standard drink of alcohol    Types: 1 Glasses of wine per week   Drug use: No   Sexual activity: Not on file  Other Topics Concern   Not on file  Social History Narrative   Widower, husband died around 47 (wrongful death per pt's report).   Originally from [place], has been in [place] since about 1997.   Lives with daughter  Former Airline pilot and lay pastor-retired.  Enjoys Engineer, materials.   Distant hx of tobacco abuse.  No alcohol or drugs.   Social Determinants of Health   Financial Resource Strain: Low Risk  (02/09/2023)   Overall Financial Resource Strain (CARDIA)    Difficulty of Paying Living Expenses: Not hard at all  Food  Insecurity: No Food Insecurity (02/09/2023)   Hunger Vital Sign    Worried About Running Out of Food in the Last Year: Never true    Ran Out of Food in the Last Year: Never true  Transportation Needs: No Transportation Needs (02/09/2023)   PRAPARE - Administrator, Civil Service (Medical): No    Lack of Transportation (Non-Medical): No  Physical Activity: Inactive (02/09/2023)   Exercise Vital Sign    Days of Exercise per Week: 0 days    Minutes of Exercise per Session: 0 min  Stress: No Stress Concern Present (02/09/2023)   [employer] of Occupational Health - Occupational Stress Questionnaire    Feeling of Stress : Not at all  Social Connections: Socially Isolated (02/09/2023)   Social Connection and Isolation Panel [NHANES]    Frequency of Communication with Friends and Family: More than three times a week    Frequency of Social Gatherings with Friends and Family: More than three times a week    Attends Religious Services: Never    Database administrator or Organizations: No    Attends Banker Meetings: Never    Marital Status: Widowed    Tobacco Counseling Counseling given: Not Answered   Clinical Intake:  Pre-visit preparation completed: Yes  Pain : No/denies pain     BMI - recorded: 28.34 Nutritional Status: BMI 25 -29 Overweight Nutritional Risks: None Diabetes: No  How often do you need to have someone help you when you read instructions, pamphlets, or other written materials from your doctor or pharmacy?: 1 - Never  Interpreter Needed?: No  Information entered by :: Lanier Ensign, LPN   Activities of Daily Living    02/09/2023   11:36 AM  In your present state of health, do you have any difficulty performing the following activities:  Hearing? 0  Vision? 0  Difficulty concentrating or making decisions? 0  Walking or climbing stairs? 1  Dressing or bathing? 1  Comment daughter assist  Doing errands, shopping? 0   Preparing Food and eating ? Y  Comment daughter  Using the Toilet? N  In  the past six months, have you accidently leaked urine? N  Do you have problems with loss of bowel control? N  Managing your Medications? N  Managing your Finances? N  Housekeeping or managing your Housekeeping? N    Patient Care Team: Willow Ora, MD as PCP - General (Family Medicine) Croitoru, Rachelle Hora, MD as PCP - Cardiology (Cardiology) Marinus Maw, MD as PCP - Electrophysiology (Cardiology) Croitoru, Rachelle Hora, MD as Consulting Physician (Cardiology) Drema Dallas, DO as Consulting Physician (Neurology) Loreta Ave, MD (Inactive) as Consulting Physician (Orthopedic Surgery) Ollen Gross, MD as Consulting Physician (Orthopedic Surgery) Campbell Stall, MD as Consulting Physician (Dermatology) Briggs (Optometry) Gevena Cotton (Dentistry) Janalyn Harder, MD (Inactive) as Consulting Physician (Dermatology) Esaw Dace, MD as Attending Physician (Urology) Alejandro Mulling, RN as [facility] Care Management  Indicate any recent Medical Services you may have received from other than [facility] providers in the past year (date may be approximate).     Assessment:   This is a routine wellness examination for Brantley.  Hearing/Vision screen Hearing Screening - Comments:: Pt denies any hearing issues  Vision Screening - Comments:: Pt follows up with Dr Hyacinth Meeker for annual eye exams    Goals Addressed             This Visit's Progress    Patient Stated       Maintain health and activity        Depression Screen    02/09/2023   11:38 AM 06/19/2022   11:28 AM 04/09/2022    8:31 AM 12/18/2021    9:54 AM 10/04/2020   11:48 AM 02/27/2020    2:46 PM 01/24/2019    2:45 PM  PHQ 2/9 Scores  PHQ - 2 Score 0 2 0 0 0 0 0  PHQ- 9 Score  6         Fall Risk    02/09/2023   11:43 AM 06/19/2022   11:28 AM 04/09/2022    8:31 AM 12/18/2021    9:54 AM 10/04/2020   11:49 AM  Fall Risk   Falls in the  past year? 0 1 0 0 1  Number falls in past yr: 0 0 0 0 0  Injury with Fall? 0 0 0 0 1  Risk for fall due to : Impaired mobility No Fall Risks History of fall(s);Impaired balance/gait No Fall Risks No Fall Risks  Follow up Falls prevention discussed Falls evaluation completed Falls evaluation completed Falls evaluation completed     MEDICARE RISK AT HOME: Medicare Risk at Home Any stairs in or around the home?: No If so, are there any without handrails?: No Home free of loose throw rugs in walkways, pet beds, electrical cords, etc?: Yes Adequate lighting in your home to reduce risk of falls?: Yes Life alert?: Yes Use of a cane, walker or w/c?: Yes Grab bars in the bathroom?: Yes Shower chair or bench in shower?: Yes Elevated toilet seat or a handicapped toilet?: Yes  TIMED UP AND GO:  Was the test performed? No    Cognitive Function:    01/24/2019    2:46 PM 09/07/2017    3:16 PM 01/14/2017    8:22 AM 01/07/2016   10:25 AM  MMSE - Mini Mental State Exam  Orientation to time 5 5 4 4   Orientation to Place 5 4 5 5   Registration 3 3 3 3   Attention/ Calculation 4 5 5 5   Recall 1 3 2 3   Language- name  2 objects 2 2 2 2   Language- repeat 1 1 1 1   Language- follow 3 step command 3 3 3 3   Language- read & follow direction 1 1 1 1   Write a sentence 1 1 1 1   Copy design 1 1 1 1   Total score 27 29 28 29         02/27/2020    2:53 PM  6CIT Screen  What Year? 0 points  What month? 3 points  Count back from 20 0 points  Months in reverse 4 points  Repeat phrase 4 points    Immunizations Immunization History  Administered Date(s) Administered   Fluad Quad(high Dose 65+) 12/17/2018, 03/19/2021, 12/18/2021   Influenza Split 02/20/2014   Influenza, High Dose Seasonal PF 12/28/2014, 01/14/2017, 12/08/2017   Influenza,inj,Quad PF,6+ Mos 02/20/2014   Influenza,inj,Quad PF,6-35 Mos 11/19/2012   Influenza-Unspecified 11/19/2012, 12/26/2015, 12/17/2018, 02/01/2020   PFIZER(Purple  Top)SARS-COV-2 Vaccination 05/01/2019, 05/24/2019, 02/01/2020   Pneumococcal Conjugate-13 12/28/2014   Pneumococcal Polysaccharide-23 05/20/2006   Tdap 09/08/2011    TDAP status: Due, Education has been provided regarding the importance of this vaccine. Advised may receive this vaccine at local pharmacy or Health Dept. Aware to provide a copy of the vaccination record if obtained from local pharmacy or Health Dept. Verbalized acceptance and understanding.  Flu Vaccine status: Due, Education has been provided regarding the importance of this vaccine. Advised may receive this vaccine at local pharmacy or Health Dept. Aware to provide a copy of the vaccination record if obtained from local pharmacy or Health Dept. Verbalized acceptance and understanding.  Pneumococcal vaccine status: Up to date  Covid-19 vaccine status: Information provided on how to obtain vaccines.   Qualifies for Shingles Vaccine? Yes   Zostavax completed No   Shingrix Completed?: No.    Education has been provided regarding the importance of this vaccine. Patient has been advised to call insurance company to determine out of pocket expense if they have not yet received this vaccine. Advised may also receive vaccine at local pharmacy or Health Dept. Verbalized acceptance and understanding.  Screening Tests Health Maintenance  Topic Date Due   DTaP/Tdap/Td (2 - Td or Tdap) 09/07/2021   INFLUENZA VACCINE  06/15/2023 (Originally 10/16/2022)   Medicare Annual Wellness (AWV)  02/09/2024   Pneumonia Vaccine 59+ Years old  Completed   HPV VACCINES  Aged Out   DEXA SCAN  Discontinued   COVID-19 Vaccine  Discontinued   Zoster Vaccines- Shingrix  Discontinued    Health Maintenance  Health Maintenance Due  Topic Date Due   DTaP/Tdap/Td (2 - Td or Tdap) 09/07/2021    Colorectal cancer screening: No longer required.   Mammogram status: No longer required due to age .    Additional Screening:  Vision Screening:  Recommended annual ophthalmology exams for early detection of glaucoma and other disorders of the eye. Is the patient up to date with their annual eye exam?  No  Who is the provider or what is the name of the office in which the patient attends annual eye exams? Dr Hyacinth Meeker  If pt is not established with a provider, would they like to be referred to a provider to establish care? No .   Dental Screening: Recommended annual dental exams for proper oral hygiene  Community Resource Referral / Chronic Care Management: CRR required this visit?  No   CCM required this visit?  No     Plan:     I have personally reviewed and noted the following  in the patient's chart:   Medical and social history Use of alcohol, tobacco or illicit drugs  Current medications and supplements including opioid prescriptions. Patient is not currently taking opioid prescriptions. Functional ability and status Nutritional status Physical activity Advanced directives List of other physicians Hospitalizations, surgeries, and ER visits in previous 12 months Vitals Screenings to include cognitive, depression, and falls Referrals and appointments  In addition, I have reviewed and discussed with patient certain preventive protocols, quality metrics, and best practice recommendations. A written personalized care plan for preventive services as well as general preventive health recommendations were provided to patient.     Marzella Schlein, LPN   16/12/9602   After Visit Summary: (MyChart) Due to this being a telephonic visit, the after visit summary with patients personalized plan was offered to patient via MyChart   Nurse Notes: none

## 2023-02-09 NOTE — Progress Notes (Signed)
Subjective:   Madison White is a 89 y.o. female who presents for an Initial Medicare Annual Wellness Visit.  Visit Complete: Virtual I connected with  Madison White on 02/09/23 by a audio enabled telemedicine application and verified that I am speaking with the correct person using two identifiers.  Patient Location: Home  Provider Location: Office/Clinic  I discussed the limitations of evaluation and management by telemedicine. The patient expressed understanding and agreed to proceed.  Vital Signs: Because this visit was a virtual/telehealth visit, some criteria may be missing or patient reported. Any vitals not documented were not able to be obtained and vitals that have been documented are patient reported.   Cardiac Risk Factors include: advanced age (>80men, >61 women);dyslipidemia;hypertension     Objective:    Today's Vitals   02/09/23 1134  Weight: 150 lb (68 kg)   Body mass index is 28.34 kg/m.     02/09/2023   11:41 AM 11/04/2022    3:36 PM 03/11/2021    2:37 PM 02/27/2020    2:48 PM 10/24/2019    9:09 PM 08/09/2019    2:29 PM 01/24/2019    2:44 PM  Advanced Directives  Does Patient Have a Medical Advance Directive? Yes No No Yes No No Yes  Type of Estate agent of [place];Living will   Healthcare Power of [place];Living will   Healthcare Power of [place];Living will  Does patient want to make changes to medical advance directive?       No - Patient declined  Copy of Healthcare Power of Attorney in Chart? No - copy requested   No - copy requested   No - copy requested  Would patient like information on creating a medical advance directive?   No - Patient declined   Yes (MAU/Ambulatory/Procedural Areas - Information given)     Current Medications (verified) Outpatient Encounter Medications as of 02/09/2023  Medication Sig   apixaban (ELIQUIS) 5 MG TABS tablet Take 1 tablet (5 mg total) by mouth 2 (two) times daily.   calcium-vitamin  D (OSCAL WITH D) 500-200 MG-UNIT tablet Take 1 tablet by mouth.   Cholecalciferol 25 MCG (1000 UT) tablet Take 1,000 Units by mouth daily.   cyanocobalamin 100 MCG tablet Take by mouth daily.   donepezil (ARICEPT) 10 MG tablet TAKE 1 TABLET BY MOUTH EVERYDAY AT BEDTIME   ELDERBERRY PO Take 1 tablet by mouth daily in the afternoon. Chew Gummie   Iron-Vitamin C 100-250 MG TABS Take by mouth.   metoprolol tartrate (LOPRESSOR) 25 MG tablet TAKE 1 TABLET BY MOUTH TWICE A DAY (Patient taking differently: Take 12.5 mg by mouth 2 (two) times daily.)   MYRBETRIQ 25 MG TB24 tablet TAKE 1 TABLET (25 MG TOTAL) BY MOUTH DAILY.   omeprazole (PRILOSEC) 20 MG capsule TAKE 1 CAPSULE (20 MG TOTAL) BY MOUTH DAILY. PLEASE SCHEDULE FOLLOW UP APPT WITH DR. Mardelle Matte FOR FURTHER REFILLS   PEDIATRIC MULTIVITAMINS-IRON PO Take 1 tablet by mouth daily in the afternoon. Chew gummie   No facility-administered encounter medications on file as of 02/09/2023.    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   History of blood transfusion    History of rheumatic fever    HTN (hypertension)    hx of refusing treatment--White coat HTN and/or situational HTN (?)    Hyperlipidemia    hx of refusing treatment   Insulin resistance    A1c's excellent per old records (6.1 in 2008 and 2009)   Left bundle branch block 12/2011   Dr. Royann Shivers at [facility]; ECHO and myocardial perfusion scan showed  septal and apical wall  motion abnormality but she had no significant valvular disease and no ischemia.  She did have mildly decreased EF (39% on lexiscan and 50% on echo) and abnl LV relaxation.  Mild amount of PVCs.  Pt at higher risk for other conduction abnormalities, good chance of eventually requiring a pacemaker.    LVH (left ventricular hypertrophy)    Nonischemic cardiomyopathy (HCC) 2016   LV dysfunction due to LBBB/septal dyssynchrony   Osteoarthritis    bilat knees--needs bilat TKA.  Ortho is trying steroid injections as of 10/23/15.   Presence of biventricular cardiac pacemaker 04/05/2019   Presence of permanent cardiac pacemaker 05/05/2017   Seasonal allergies    Solitary pulmonary nodule 05/2016   8 mm pleural based nodule in RLL.  Radiology recommended f/u noncontrast CT in 6-12 mo.   Past Surgical History:  Procedure Laterality Date   ABDOMINAL HYSTERECTOMY  1963   At the time of her last C/S; says she had partial bladder resection at that time as well   BI-VENTRICULAR PACEMAKER INSERTION (CRT-P)  05/05/2017   BIV PACEMAKER INSERTION CRT-P N/A 05/05/2017   Procedure: BIV PACEMAKER INSERTION CRT-P;  Surgeon: Marinus Maw, MD;  Location: [facility] INVASIVE CV LAB;  Service: Cardiovascular;  Laterality: N/A;   CARDIOVASCULAR STRESS TEST  08/2005 & 12/2011   Low risk scans (on the 12/2011 scan she did have EF 39% with moderately severe LV dysfunction with septal dyssynergy probably contributed by LBBB   CARDIOVERSION N/A 08/09/2019   Procedure: CARDIOVERSION;  Surgeon: Chrystie Nose, MD;  Location: [facility] ENDOSCOPY;  Service: Cardiovascular;  Laterality: N/A;   Carotid dopplers  11/22/14; 11/30/16   NORMAL 2016 and 2018   CATARACT EXTRACTION  08/14/11   both   CESAREAN SECTION  X 5   One of her neonates died soon after birth   CHOLECYSTECTOMY     1980's   COLONOSCOPY WITH PROPOFOL N/A 09/02/2017   Procedure: COLONOSCOPY WITH PROPOFOL;  Surgeon: Meryl Dare, MD;  Location: [facility] ENDOSCOPY;  Service: Endoscopy;   Laterality: N/A;   HOT HEMOSTASIS N/A 09/02/2017   Procedure: HOT HEMOSTASIS (ARGON PLASMA COAGULATION/BICAP);  Surgeon: Meryl Dare, MD;  Location: [facility] ENDOSCOPY;  Service: Endoscopy;  Laterality: N/A;   INSERT / REPLACE / REMOVE PACEMAKER     JOINT REPLACEMENT Bilateral    knee   right knee surgery Right 05/2016   ? R TKA: no records.   STRABISMUS SURGERY  1939   TONSILLECTOMY  1938   TRANSTHORACIC ECHOCARDIOGRAM  01/13/12   Septal dyssynergy, EF 50%, LV relaxation impaired.  No significant valvular abnormalities.   Family History  Problem Relation Age of Onset   Hypertension Mother    Heart disease Mother    Hyperlipidemia Mother    Diabetes Mother    Heart disease Father    Cancer Father    Social History   Socioeconomic History  Marital status: Widowed    Spouse name: Not on file   Number of children: Not on file   Years of education: Not on file   Highest education level: Not on file  Occupational History   Occupation: retired   Tobacco Use   Smoking status: Former    Current packs/day: 0.00    Types: Cigarettes    Quit date: 05/31/1958    Years since quitting: 64.7   Smokeless tobacco: Never  Vaping Use   Vaping status: Never Used  Substance and Sexual Activity   Alcohol use: Not Currently    Alcohol/week: 1.0 standard drink of alcohol    Types: 1 Glasses of wine per week   Drug use: No   Sexual activity: Not on file  Other Topics Concern   Not on file  Social History Narrative   Widower, husband died around 33 (wrongful death per pt's report).   Originally from [place], has been in [place] since about 1997.   Lives with daughter  Former Airline pilot and lay pastor-retired.  Enjoys Engineer, materials.   Distant hx of tobacco abuse.  No alcohol or drugs.   Social Determinants of Health   Financial Resource Strain: Low Risk  (02/09/2023)   Overall Financial Resource Strain (CARDIA)    Difficulty of Paying Living Expenses: Not hard at all  Food  Insecurity: No Food Insecurity (02/09/2023)   Hunger Vital Sign    Worried About Running Out of Food in the Last Year: Never true    Ran Out of Food in the Last Year: Never true  Transportation Needs: No Transportation Needs (02/09/2023)   PRAPARE - Administrator, Civil Service (Medical): No    Lack of Transportation (Non-Medical): No  Physical Activity: Inactive (02/09/2023)   Exercise Vital Sign    Days of Exercise per Week: 0 days    Minutes of Exercise per Session: 0 min  Stress: No Stress Concern Present (02/09/2023)   [employer] of Occupational Health - Occupational Stress Questionnaire    Feeling of Stress : Not at all  Social Connections: Socially Isolated (02/09/2023)   Social Connection and Isolation Panel [NHANES]    Frequency of Communication with Friends and Family: More than three times a week    Frequency of Social Gatherings with Friends and Family: More than three times a week    Attends Religious Services: Never    Database administrator or Organizations: No    Attends Banker Meetings: Never    Marital Status: Widowed    Tobacco Counseling Counseling given: Not Answered   Clinical Intake:  Pre-visit preparation completed: Yes  Pain : No/denies pain     BMI - recorded: 28.34 Nutritional Status: BMI 25 -29 Overweight Nutritional Risks: None Diabetes: No  How often do you need to have someone help you when you read instructions, pamphlets, or other written materials from your doctor or pharmacy?: 1 - Never  Interpreter Needed?: No  Information entered by :: Lanier Ensign, LPN   Activities of Daily Living    02/09/2023   11:36 AM  In your present state of health, do you have any difficulty performing the following activities:  Hearing? 0  Vision? 0  Difficulty concentrating or making decisions? 0  Walking or climbing stairs? 1  Dressing or bathing? 1  Comment daughter assist  Doing errands, shopping? 0   Preparing Food and eating ? Y  Comment daughter  Using the Toilet? N  In  the past six months, have you accidently leaked urine? N  Do you have problems with loss of bowel control? N  Managing your Medications? N  Managing your Finances? N  Housekeeping or managing your Housekeeping? N    Patient Care Team: Willow Ora, MD as PCP - General (Family Medicine) Croitoru, Rachelle Hora, MD as PCP - Cardiology (Cardiology) Marinus Maw, MD as PCP - Electrophysiology (Cardiology) Croitoru, Rachelle Hora, MD as Consulting Physician (Cardiology) Drema Dallas, DO as Consulting Physician (Neurology) Loreta Ave, MD (Inactive) as Consulting Physician (Orthopedic Surgery) Ollen Gross, MD as Consulting Physician (Orthopedic Surgery) Campbell Stall, MD as Consulting Physician (Dermatology) [place] (Optometry) Gevena Cotton (Dentistry) Janalyn Harder, MD (Inactive) as Consulting Physician (Dermatology) Esaw Dace, MD as Attending Physician (Urology) Alejandro Mulling, RN as [facility] Care Management  Indicate any recent Medical Services you may have received from other than [facility] providers in the past year (date may be approximate).     Assessment:   This is a routine wellness examination for Lenzi.  Hearing/Vision screen Hearing Screening - Comments:: Pt denies any hearing issues  Vision Screening - Comments:: Pt follows up with Dr Hyacinth Meeker for annual eye exams    Goals Addressed             This Visit's Progress    Patient Stated       Maintain health and activity        Depression Screen    02/09/2023   11:38 AM 06/19/2022   11:28 AM 04/09/2022    8:31 AM 12/18/2021    9:54 AM 10/04/2020   11:48 AM 02/27/2020    2:46 PM 01/24/2019    2:45 PM  PHQ 2/9 Scores  PHQ - 2 Score 0 2 0 0 0 0 0  PHQ- 9 Score  6         Fall Risk    02/09/2023   11:43 AM 06/19/2022   11:28 AM 04/09/2022    8:31 AM 12/18/2021    9:54 AM 10/04/2020   11:49 AM  Fall Risk   Falls in the  past year? 0 1 0 0 1  Number falls in past yr: 0 0 0 0 0  Injury with Fall? 0 0 0 0 1  Risk for fall due to : Impaired mobility No Fall Risks History of fall(s);Impaired balance/gait No Fall Risks No Fall Risks  Follow up Falls prevention discussed Falls evaluation completed Falls evaluation completed Falls evaluation completed     MEDICARE RISK AT HOME: Medicare Risk at Home Any stairs in or around the home?: No If so, are there any without handrails?: No Home free of loose throw rugs in walkways, pet beds, electrical cords, etc?: Yes Adequate lighting in your home to reduce risk of falls?: Yes Life alert?: Yes Use of a cane, walker or w/c?: Yes Grab bars in the bathroom?: Yes Shower chair or bench in shower?: Yes Elevated toilet seat or a handicapped toilet?: Yes  TIMED UP AND GO:  Was the test performed? No    Cognitive Function:    02/09/2023    1:19 PM 01/24/2019    2:46 PM 09/07/2017    3:16 PM 01/14/2017    8:22 AM 01/07/2016   10:25 AM  MMSE - Mini Mental State Exam  Not completed: Unable to complete      Orientation to time  5 5 4 4   Orientation to Place  5 4 5 5   Registration  3  3 3 3   Attention/ Calculation  4 5 5 5   Recall  1 3 2 3   Language- name 2 objects  2 2 2 2   Language- repeat  1 1 1 1   Language- follow 3 step command  3 3 3 3   Language- read & follow direction  1 1 1 1   Write a sentence  1 1 1 1   Copy design  1 1 1 1   Total score  27 29 28 29         02/27/2020    2:53 PM  6CIT Screen  What Year? 0 points  What month? 3 points  Count back from 20 0 points  Months in reverse 4 points  Repeat phrase 4 points    Immunizations Immunization History  Administered Date(s) Administered   Fluad Quad(high Dose 65+) 12/17/2018, 03/19/2021, 12/18/2021   Influenza Split 02/20/2014   Influenza, High Dose Seasonal PF 12/28/2014, 01/14/2017, 12/08/2017   Influenza,inj,Quad PF,6+ Mos 02/20/2014   Influenza,inj,Quad PF,6-35 Mos 11/19/2012    Influenza-Unspecified 11/19/2012, 12/26/2015, 12/17/2018, 02/01/2020   PFIZER(Purple Top)SARS-COV-2 Vaccination 05/01/2019, 05/24/2019, 02/01/2020   Pneumococcal Conjugate-13 12/28/2014   Pneumococcal Polysaccharide-23 05/20/2006   Tdap 09/08/2011    TDAP status: Due, Education has been provided regarding the importance of this vaccine. Advised may receive this vaccine at local pharmacy or Health Dept. Aware to provide a copy of the vaccination record if obtained from local pharmacy or Health Dept. Verbalized acceptance and understanding.  Flu Vaccine status: Due, Education has been provided regarding the importance of this vaccine. Advised may receive this vaccine at local pharmacy or Health Dept. Aware to provide a copy of the vaccination record if obtained from local pharmacy or Health Dept. Verbalized acceptance and understanding.  Pneumococcal vaccine status: Up to date  Covid-19 vaccine status: Information provided on how to obtain vaccines.   Qualifies for Shingles Vaccine? Yes   Zostavax completed No   Shingrix Completed?: No.    Education has been provided regarding the importance of this vaccine. Patient has been advised to call insurance company to determine out of pocket expense if they have not yet received this vaccine. Advised may also receive vaccine at local pharmacy or Health Dept. Verbalized acceptance and understanding.  Screening Tests Health Maintenance  Topic Date Due   DTaP/Tdap/Td (2 - Td or Tdap) 09/07/2021   INFLUENZA VACCINE  06/15/2023 (Originally 10/16/2022)   Medicare Annual Wellness (AWV)  02/09/2024   Pneumonia Vaccine 56+ Years old  Completed   HPV VACCINES  Aged Out   DEXA SCAN  Discontinued   COVID-19 Vaccine  Discontinued   Zoster Vaccines- Shingrix  Discontinued    Health Maintenance  Health Maintenance Due  Topic Date Due   DTaP/Tdap/Td (2 - Td or Tdap) 09/07/2021    Colorectal cancer screening: No longer required.   Mammogram status: No  longer required due to age .    Additional Screening:  Vision Screening: Recommended annual ophthalmology exams for early detection of glaucoma and other disorders of the eye. Is the patient up to date with their annual eye exam?  No  Who is the provider or what is the name of the office in which the patient attends annual eye exams? Dr Hyacinth Meeker  If pt is not established with a provider, would they like to be referred to a provider to establish care? No .   Dental Screening: Recommended annual dental exams for proper oral hygiene  Community Resource Referral / Chronic Care Management: CRR required this  visit?  No   CCM required this visit?  No     Plan:     I have personally reviewed and noted the following in the patient's chart:   Medical and social history Use of alcohol, tobacco or illicit drugs  Current medications and supplements including opioid prescriptions. Patient is not currently taking opioid prescriptions. Functional ability and status Nutritional status Physical activity Advanced directives List of other physicians Hospitalizations, surgeries, and ER visits in previous 12 months Vitals Screenings to include cognitive, depression, and falls Referrals and appointments  In addition, I have reviewed and discussed with patient certain preventive protocols, quality metrics, and best practice recommendations. A written personalized care plan for preventive services as well as general preventive health recommendations were provided to patient.     Marzella Schlein, LPN   09/81/1914   After Visit Summary: (MyChart) Due to this being a telephonic visit, the after visit summary with patients personalized plan was offered to patient via MyChart   Nurse Notes: none

## 2023-02-26 NOTE — Progress Notes (Signed)
Remote pacemaker transmission.   

## 2023-02-26 NOTE — Addendum Note (Signed)
Addended by: Geralyn Flash D on: 02/26/2023 11:44 AM   Modules accepted: Orders

## 2023-04-22 ENCOUNTER — Other Ambulatory Visit: Payer: Self-pay | Admitting: Family Medicine

## 2023-05-04 ENCOUNTER — Ambulatory Visit (INDEPENDENT_AMBULATORY_CARE_PROVIDER_SITE_OTHER): Payer: Medicare HMO

## 2023-05-04 DIAGNOSIS — I442 Atrioventricular block, complete: Secondary | ICD-10-CM | POA: Diagnosis not present

## 2023-05-05 LAB — CUP PACEART REMOTE DEVICE CHECK
Battery Remaining Longevity: 18 mo
Battery Remaining Percentage: 21 %
Battery Voltage: 2.92 V
Date Time Interrogation Session: 20250217031250
Implantable Lead Connection Status: 753985
Implantable Lead Connection Status: 753985
Implantable Lead Connection Status: 753985
Implantable Lead Implant Date: 20190219
Implantable Lead Implant Date: 20190219
Implantable Lead Implant Date: 20190219
Implantable Lead Location: 753858
Implantable Lead Location: 753859
Implantable Lead Location: 753860
Implantable Pulse Generator Implant Date: 20190219
Lead Channel Impedance Value: 590 Ohm
Lead Channel Impedance Value: 600 Ohm
Lead Channel Pacing Threshold Amplitude: 0.75 V
Lead Channel Pacing Threshold Amplitude: 1.75 V
Lead Channel Pacing Threshold Pulse Width: 0.5 ms
Lead Channel Pacing Threshold Pulse Width: 1 ms
Lead Channel Sensing Intrinsic Amplitude: 12 mV
Lead Channel Setting Pacing Amplitude: 2.25 V
Lead Channel Setting Pacing Amplitude: 2.5 V
Lead Channel Setting Pacing Pulse Width: 0.5 ms
Lead Channel Setting Pacing Pulse Width: 1 ms
Lead Channel Setting Sensing Sensitivity: 4 mV
Pulse Gen Model: 3262
Pulse Gen Serial Number: 8995296

## 2023-05-06 ENCOUNTER — Encounter: Payer: Self-pay | Admitting: Internal Medicine

## 2023-06-07 ENCOUNTER — Emergency Department (HOSPITAL_BASED_OUTPATIENT_CLINIC_OR_DEPARTMENT_OTHER)

## 2023-06-07 ENCOUNTER — Other Ambulatory Visit: Payer: Self-pay

## 2023-06-07 ENCOUNTER — Encounter (HOSPITAL_COMMUNITY): Payer: Self-pay | Admitting: Internal Medicine

## 2023-06-07 ENCOUNTER — Inpatient Hospital Stay (HOSPITAL_BASED_OUTPATIENT_CLINIC_OR_DEPARTMENT_OTHER)
Admission: EM | Admit: 2023-06-07 | Discharge: 2023-06-11 | DRG: 064 | Disposition: A | Discharge status: Skilled Nursing Facility | Attending: Family Medicine | Admitting: Family Medicine

## 2023-06-07 DIAGNOSIS — K5909 Other constipation: Secondary | ICD-10-CM | POA: Diagnosis present

## 2023-06-07 DIAGNOSIS — I4819 Other persistent atrial fibrillation: Secondary | ICD-10-CM | POA: Diagnosis not present

## 2023-06-07 DIAGNOSIS — Z96651 Presence of right artificial knee joint: Secondary | ICD-10-CM | POA: Diagnosis not present

## 2023-06-07 DIAGNOSIS — Z888 Allergy status to other drugs, medicaments and biological substances status: Secondary | ICD-10-CM

## 2023-06-07 DIAGNOSIS — R2689 Other abnormalities of gait and mobility: Secondary | ICD-10-CM | POA: Diagnosis not present

## 2023-06-07 DIAGNOSIS — I618 Other nontraumatic intracerebral hemorrhage: Secondary | ICD-10-CM | POA: Diagnosis not present

## 2023-06-07 DIAGNOSIS — G8194 Hemiplegia, unspecified affecting left nondominant side: Secondary | ICD-10-CM | POA: Diagnosis present

## 2023-06-07 DIAGNOSIS — R278 Other lack of coordination: Secondary | ICD-10-CM | POA: Diagnosis not present

## 2023-06-07 DIAGNOSIS — F0150 Vascular dementia without behavioral disturbance: Secondary | ICD-10-CM | POA: Diagnosis not present

## 2023-06-07 DIAGNOSIS — I63412 Cerebral infarction due to embolism of left middle cerebral artery: Secondary | ICD-10-CM | POA: Diagnosis not present

## 2023-06-07 DIAGNOSIS — R1310 Dysphagia, unspecified: Secondary | ICD-10-CM | POA: Diagnosis present

## 2023-06-07 DIAGNOSIS — I63511 Cerebral infarction due to unspecified occlusion or stenosis of right middle cerebral artery: Secondary | ICD-10-CM | POA: Diagnosis not present

## 2023-06-07 DIAGNOSIS — R4182 Altered mental status, unspecified: Secondary | ICD-10-CM | POA: Diagnosis not present

## 2023-06-07 DIAGNOSIS — Z79899 Other long term (current) drug therapy: Secondary | ICD-10-CM

## 2023-06-07 DIAGNOSIS — I442 Atrioventricular block, complete: Secondary | ICD-10-CM | POA: Diagnosis not present

## 2023-06-07 DIAGNOSIS — Z515 Encounter for palliative care: Secondary | ICD-10-CM

## 2023-06-07 DIAGNOSIS — I447 Left bundle-branch block, unspecified: Secondary | ICD-10-CM | POA: Diagnosis not present

## 2023-06-07 DIAGNOSIS — Z950 Presence of cardiac pacemaker: Secondary | ICD-10-CM | POA: Diagnosis not present

## 2023-06-07 DIAGNOSIS — Z043 Encounter for examination and observation following other accident: Secondary | ICD-10-CM | POA: Diagnosis not present

## 2023-06-07 DIAGNOSIS — I69391 Dysphagia following cerebral infarction: Secondary | ICD-10-CM

## 2023-06-07 DIAGNOSIS — Z87891 Personal history of nicotine dependence: Secondary | ICD-10-CM | POA: Diagnosis not present

## 2023-06-07 DIAGNOSIS — Z90710 Acquired absence of both cervix and uterus: Secondary | ICD-10-CM

## 2023-06-07 DIAGNOSIS — W06XXXA Fall from bed, initial encounter: Secondary | ICD-10-CM | POA: Diagnosis not present

## 2023-06-07 DIAGNOSIS — Z91048 Other nonmedicinal substance allergy status: Secondary | ICD-10-CM

## 2023-06-07 DIAGNOSIS — I639 Cerebral infarction, unspecified: Principal | ICD-10-CM | POA: Diagnosis present

## 2023-06-07 DIAGNOSIS — Z882 Allergy status to sulfonamides status: Secondary | ICD-10-CM

## 2023-06-07 DIAGNOSIS — N3281 Overactive bladder: Secondary | ICD-10-CM | POA: Diagnosis not present

## 2023-06-07 DIAGNOSIS — Z809 Family history of malignant neoplasm, unspecified: Secondary | ICD-10-CM

## 2023-06-07 DIAGNOSIS — K219 Gastro-esophageal reflux disease without esophagitis: Secondary | ICD-10-CM | POA: Diagnosis not present

## 2023-06-07 DIAGNOSIS — Z7982 Long term (current) use of aspirin: Secondary | ICD-10-CM

## 2023-06-07 DIAGNOSIS — F0390 Unspecified dementia without behavioral disturbance: Secondary | ICD-10-CM | POA: Diagnosis not present

## 2023-06-07 DIAGNOSIS — R140 Abdominal distension (gaseous): Secondary | ICD-10-CM | POA: Diagnosis not present

## 2023-06-07 DIAGNOSIS — I4891 Unspecified atrial fibrillation: Secondary | ICD-10-CM | POA: Diagnosis present

## 2023-06-07 DIAGNOSIS — I10 Essential (primary) hypertension: Secondary | ICD-10-CM | POA: Diagnosis not present

## 2023-06-07 DIAGNOSIS — Z91040 Latex allergy status: Secondary | ICD-10-CM

## 2023-06-07 DIAGNOSIS — R29707 NIHSS score 7: Secondary | ICD-10-CM | POA: Diagnosis present

## 2023-06-07 DIAGNOSIS — Z66 Do not resuscitate: Secondary | ICD-10-CM | POA: Diagnosis present

## 2023-06-07 DIAGNOSIS — M6281 Muscle weakness (generalized): Secondary | ICD-10-CM | POA: Diagnosis not present

## 2023-06-07 DIAGNOSIS — Z880 Allergy status to penicillin: Secondary | ICD-10-CM

## 2023-06-07 DIAGNOSIS — Z833 Family history of diabetes mellitus: Secondary | ICD-10-CM

## 2023-06-07 DIAGNOSIS — Z86718 Personal history of other venous thrombosis and embolism: Secondary | ICD-10-CM | POA: Diagnosis not present

## 2023-06-07 DIAGNOSIS — I428 Other cardiomyopathies: Secondary | ICD-10-CM | POA: Diagnosis present

## 2023-06-07 DIAGNOSIS — R29810 Facial weakness: Secondary | ICD-10-CM | POA: Diagnosis present

## 2023-06-07 DIAGNOSIS — E785 Hyperlipidemia, unspecified: Secondary | ICD-10-CM | POA: Diagnosis not present

## 2023-06-07 DIAGNOSIS — R233 Spontaneous ecchymoses: Secondary | ICD-10-CM | POA: Diagnosis not present

## 2023-06-07 DIAGNOSIS — G936 Cerebral edema: Secondary | ICD-10-CM | POA: Diagnosis not present

## 2023-06-07 DIAGNOSIS — I63411 Cerebral infarction due to embolism of right middle cerebral artery: Secondary | ICD-10-CM | POA: Diagnosis not present

## 2023-06-07 DIAGNOSIS — R9431 Abnormal electrocardiogram [ECG] [EKG]: Secondary | ICD-10-CM | POA: Diagnosis not present

## 2023-06-07 DIAGNOSIS — Z83438 Family history of other disorder of lipoprotein metabolism and other lipidemia: Secondary | ICD-10-CM

## 2023-06-07 DIAGNOSIS — Z7901 Long term (current) use of anticoagulants: Secondary | ICD-10-CM | POA: Diagnosis not present

## 2023-06-07 DIAGNOSIS — Z8249 Family history of ischemic heart disease and other diseases of the circulatory system: Secondary | ICD-10-CM

## 2023-06-07 DIAGNOSIS — I6389 Other cerebral infarction: Secondary | ICD-10-CM | POA: Diagnosis not present

## 2023-06-07 DIAGNOSIS — I6310 Cerebral infarction due to embolism of unspecified precerebral artery: Secondary | ICD-10-CM | POA: Diagnosis not present

## 2023-06-07 DIAGNOSIS — Z7409 Other reduced mobility: Secondary | ICD-10-CM | POA: Diagnosis present

## 2023-06-07 LAB — PROTIME-INR
INR: 1.3 — ABNORMAL HIGH (ref 0.8–1.2)
Prothrombin Time: 16.5 s — ABNORMAL HIGH (ref 11.4–15.2)

## 2023-06-07 LAB — APTT: aPTT: 28 s (ref 24–36)

## 2023-06-07 LAB — COMPREHENSIVE METABOLIC PANEL
ALT: 11 U/L (ref 0–44)
AST: 20 U/L (ref 15–41)
Albumin: 3.9 g/dL (ref 3.5–5.0)
Alkaline Phosphatase: 41 U/L (ref 38–126)
Anion gap: 12 (ref 5–15)
BUN: 12 mg/dL (ref 8–23)
CO2: 24 mmol/L (ref 22–32)
Calcium: 9.2 mg/dL (ref 8.9–10.3)
Chloride: 105 mmol/L (ref 98–111)
Creatinine, Ser: 0.74 mg/dL (ref 0.44–1.00)
GFR, Estimated: >60 mL/min (ref >60)
Glucose, Bld: 153 mg/dL — ABNORMAL HIGH (ref 70–99)
Potassium: 3.7 mmol/L (ref 3.5–5.1)
Sodium: 141 mmol/L (ref 135–145)
Total Bilirubin: 0.4 mg/dL (ref 0.0–1.2)
Total Protein: 6.7 g/dL (ref 6.5–8.1)

## 2023-06-07 LAB — DIFFERENTIAL
Abs Immature Granulocytes: 0.05 10*3/uL (ref 0.00–0.07)
Basophils Absolute: 0 10*3/uL (ref 0.0–0.1)
Basophils Relative: 0 %
Eosinophils Absolute: 0 10*3/uL (ref 0.0–0.5)
Eosinophils Relative: 0 %
Immature Granulocytes: 0 %
Lymphocytes Relative: 8 %
Lymphs Abs: 1.1 10*3/uL (ref 0.7–4.0)
Monocytes Absolute: 0.6 10*3/uL (ref 0.1–1.0)
Monocytes Relative: 4 %
Neutro Abs: 12.6 10*3/uL — ABNORMAL HIGH (ref 1.7–7.7)
Neutrophils Relative %: 88 %

## 2023-06-07 LAB — CBC
HCT: 38.4 % (ref 36.0–46.0)
Hemoglobin: 12.8 g/dL (ref 12.0–15.0)
MCH: 29.4 pg (ref 26.0–34.0)
MCHC: 33.3 g/dL (ref 30.0–36.0)
MCV: 88.1 fL (ref 80.0–100.0)
Platelets: 331 10*3/uL (ref 150–400)
RBC: 4.36 MIL/uL (ref 3.87–5.11)
RDW: 14.4 % (ref 11.5–15.5)
WBC: 14.4 10*3/uL — ABNORMAL HIGH (ref 4.0–10.5)
nRBC: 0 % (ref 0.0–0.2)

## 2023-06-07 LAB — ETHANOL: Alcohol, Ethyl (B): <10 mg/dL (ref <10)

## 2023-06-07 LAB — CBG MONITORING, ED: Glucose-Capillary: 151 mg/dL — ABNORMAL HIGH (ref 70–99)

## 2023-06-07 MED ORDER — SODIUM CHLORIDE 0.9% FLUSH: 3.0000 mL | Freq: Once | INTRAVENOUS | Status: DC

## 2023-06-07 MED ORDER — DONEPEZIL HCL 10 MG PO TABS
10.0000 mg | ORAL_TABLET | Freq: Every day | ORAL | Status: DC
  Administered 2023-06-08 – 2023-06-10 (×4): 10 mg via ORAL
  Filled 2023-06-07 (×4): qty 1

## 2023-06-07 MED ORDER — IOHEXOL 350 MG/ML SOLN
100.0000 mL | Freq: Once | INTRAVENOUS | Status: AC | PRN
  Administered 2023-06-07: 100 mL via INTRAVENOUS

## 2023-06-07 MED ORDER — STROKE: EARLY STAGES OF RECOVERY BOOK
Freq: Once | Status: AC
  Filled 2023-06-07: qty 1

## 2023-06-07 MED ORDER — ACETAMINOPHEN 500 MG PO TABS
1000.0000 mg | ORAL_TABLET | Freq: Once | ORAL | Status: AC
  Administered 2023-06-07: 1000 mg via ORAL
  Filled 2023-06-07: qty 2

## 2023-06-07 MED ORDER — ASPIRIN 81 MG PO CHEW
81.0000 mg | CHEWABLE_TABLET | Freq: Every day | ORAL | Status: DC
  Administered 2023-06-08 – 2023-06-09 (×2): 81 mg via ORAL
  Filled 2023-06-07 (×2): qty 1

## 2023-06-07 NOTE — ED Provider Notes (Signed)
 [place] [facility] AT [facility] Provider Note   CSN: [id number] Arrival date & time: 06/07/23  1146  An emergency department physician performed an initial assessment on this suspected stroke patient at 1152.  History  Chief Complaint  Patient presents with   Code Stroke    Madison [name] is a 89 y.o. female.  HPI   89 year old female with medical history significant for hypertension, hyperlipidemia, GERD, biventricular pacemaker in place, nonischemic cardiomyopathy, DVT on Eliquis presenting to the ER with a facial droop after a fall. The patient got up this morning and and sustained a fall getting out of bed. She fell after getting out of bed, unclear head trauma or LOC. The patient cannot remember the fall. Her last dose of Eliquis was this morning. Last known normal when she went to bed last night. On arrival, CGB was 151. The patient reportedly had L facial droop and L hemibody weakness after the fall which appears to be resolving, however, given the time frame, a code stroke was called on patient arrival.  Home Medications Prior to Admission medications   Medication Sig Start Date End Date Taking? Authorizing Provider  calcium-vitamin D (OSCAL WITH D) 500-200 MG-UNIT tablet Take 1 tablet by mouth.    [provider]  Cholecalciferol 25 MCG (1000 UT) tablet Take 1,000 Units by mouth daily.    [provider]  cyanocobalamin 100 MCG tablet Take by mouth daily.    [provider]  donepezil (ARICEPT) 10 MG tablet TAKE 1 TABLET BY MOUTH EVERYDAY AT BEDTIME 04/22/23   Willow Ora, MD  ELDERBERRY PO Take 1 tablet by mouth daily in the afternoon. Chew Gummie    [provider]  ELIQUIS 5 MG TABS tablet TAKE 1 TABLET BY MOUTH TWICE A DAY 04/22/23   Willow Ora, MD  Iron-Vitamin C 100-250 MG TABS Take by mouth.    [provider]  metoprolol tartrate (LOPRESSOR) 25 MG tablet TAKE 1 TABLET BY MOUTH TWICE A  DAY Patient taking differently: Take 12.5 mg by mouth 2 (two) times daily. 12/07/22   Willow Ora, MD  MYRBETRIQ 25 MG TB24 tablet TAKE 1 TABLET (25 MG TOTAL) BY MOUTH DAILY. 08/04/22   Willow Ora, MD  omeprazole (PRILOSEC) 20 MG capsule TAKE 1 CAPSULE (20 MG TOTAL) BY MOUTH DAILY. PLEASE SCHEDULE FOLLOW UP APPT WITH DR. Mardelle Matte FOR FURTHER REFILLS 05/01/20   Willow Ora, MD  PEDIATRIC MULTIVITAMINS-IRON PO Take 1 tablet by mouth daily in the afternoon. Chew gummie    [provider]      Allergies    Penicillins, Nickel, Avapro [irbesartan], Lisinopril, Simvastatin, Latex, Sulfa antibiotics, and Sulfamethoxazole    Review of Systems   Review of Systems  All other systems reviewed and are negative.   Physical Exam Updated Vital Signs BP (!) 168/70   Pulse 70   Temp 98.9 F (37.2 C) (Oral)   Resp 19   SpO2 97%  Physical Exam Vitals and nursing note reviewed.  Constitutional:      General: She is not in acute distress.    Appearance: She is well-developed.     Comments: GCS 14, ABC intact  HENT:     Head: Normocephalic and atraumatic.  Eyes:     Extraocular Movements: Extraocular movements intact.     Conjunctiva/sclera: Conjunctivae normal.     Pupils: Pupils are equal, round, and reactive to light.  Neck:     Comments: No midline tenderness  to palpation of the cervical spine.  Range of motion intact Cardiovascular:     Rate and Rhythm: Normal rate and regular rhythm.     Heart sounds: No murmur heard. Pulmonary:     Effort: Pulmonary effort is normal. No respiratory distress.     Breath sounds: Normal breath sounds.  Chest:     Comments: Clavicles stable nontender to AP compression.  Chest wall stable and nontender to AP and lateral compression. Abdominal:     General: There is no distension.     Palpations: Abdomen is soft.     Tenderness: There is no abdominal tenderness. There is no guarding.     Comments: Pelvis stable to lateral compression   Musculoskeletal:        General: No deformity or signs of injury.     Cervical back: Neck supple.     Comments: No midline tenderness to palpation of the thoracic or lumbar spine.  Extremities atraumatic with intact range of motion  Skin:    General: Skin is warm and dry.     Findings: No lesion or rash.  Neurological:     General: No focal deficit present.     Mental Status: She is alert. Mental status is at baseline.     Comments: GCS 14, mildly confused  CRANIAL NERVES:    CN 2 (Optic): Visual fields intact to confrontation.  CN 3,4,6 (EOM): Pupils equal and reactive to light. Full extraocular eye movement without nystagmus.  CN 5 (Trigeminal): Facial sensation is normal, no weakness of masticatory muscles.  CN 7 (Facial): LFD CN 8 (Auditory): Auditory acuity grossly normal.  CN 9,10 (Glossophar): The uvula is midline, the palate elevates symmetrically.  CN 11 (spinal access): Normal sternocleidomastoid and trapezius strength.  CN 12 (Hypoglossal): The tongue is midline. No atrophy or fasciculations.[name]   MOTOR:  Muscle Strength: 5/5RUE, 4/5LUE, 5/5RLE, 5/5LLE.   COORDINATION:   No tremor.   SENSATION:   Intact to light touch all four extremities.        ED Results / Procedures / Treatments   Labs (all labs ordered are listed, but only abnormal results are displayed) Labs Reviewed  PROTIME-INR - Abnormal; Notable for the following components:      Result Value   Prothrombin Time 16.5 (*)    INR 1.3 (*)    All other components within normal limits  CBC - Abnormal; Notable for the following components:   WBC 14.4 (*)    All other components within normal limits  DIFFERENTIAL - Abnormal; Notable for the following components:   Neutro Abs 12.6 (*)    All other components within normal limits  COMPREHENSIVE METABOLIC PANEL - Abnormal; Notable for the following components:   Glucose, Bld 153 (*)    All other components within normal limits  CBG MONITORING, ED -  Abnormal; Notable for the following components:   Glucose-Capillary 151 (*)    All other components within normal limits  APTT  ETHANOL  CBG MONITORING, ED    EKG None  Radiology DG Chest Portable 1 View Result Date: 06/07/2023 CLINICAL DATA:  89 year old female with fall, left side weakness. EXAM: PORTABLE CHEST 1 VIEW COMPARISON:  Chest radiographs 03/11/2021 and earlier. FINDINGS: Portable AP view at 1229 hours. Chronic left chest cardiac pacemaker. Mediastinal contours are stable and within normal limits. Lung volumes are stable and within normal limits. Allowing for portable technique the lungs are clear. Visualized tracheal air column is within normal limits. Chronic ORIF proximal right  humerus. No acute osseous abnormality identified. Paucity of bowel gas in the visible abdomen. IMPRESSION: No acute cardiopulmonary abnormality or acute traumatic injury identified. Electronically Signed   By: Odessa Fleming M.D.   On: 06/07/2023 12:57   CT ANGIO HEAD NECK W WO CM W PERF (CODE STROKE) Result Date: 06/07/2023 CLINICAL DATA:  Code stroke.  Left-sided weakness. EXAM: CT ANGIOGRAPHY HEAD AND NECK TECHNIQUE: Multidetector CT imaging of the head and neck was performed using the standard protocol during bolus administration of intravenous contrast. Multiplanar CT image reconstructions and MIPs were obtained to evaluate the vascular anatomy. Carotid stenosis measurements (when applicable) are obtained utilizing NASCET criteria, using the distal internal carotid diameter as the denominator. RADIATION DOSE REDUCTION: This exam was performed according to the departmental dose-optimization program which includes automated exposure control, adjustment of the mA and/or kV according to patient size and/or use of iterative reconstruction technique. CONTRAST:  OMNIPAQUE IOHEXOL 350 MG/ML SOLN COMPARISON:  CT head without contrast 06/07/2023. CT head without contrast 11/21/2017. FINDINGS: CTA NECK FINDINGS Aortic  arch: Atherosclerotic calcifications are present at the aortic arch and great vessel origins. No significant stenosis is present at the origins the great vessels. No aneurysm or dissection is present. Right carotid system: Right common carotid artery is within normal limits. Calcifications are present at the right carotid bifurcation without significant stenosis. The cervical right ICA is otherwise normal. Left carotid system: The left common carotid artery is within normal limits. Atherosclerotic calcifications are present at the bifurcation. Mild tortuosity is present in the mid cervical left ICA. No focal stenosis is present. Vertebral arteries: The right vertebral artery is dominant. Both vertebral arteries originate from the subclavian arteries without significant stenoses. No significant stenosis is present in either vertebral artery in the neck. Skeleton: Mild degenerative changes are present cervical spine, most evident at C3-4 and C5-6. No focal osseous lesions are present. Other neck: The soft tissues of the neck are otherwise unremarkable. Salivary glands are within normal limits. Thyroid is normal. No significant adenopathy is present. No focal mucosal or submucosal lesions are present. Upper chest: The lung apices are clear. The thoracic inlet is within normal limits. Review of the MIP images confirms the above findings CTA HEAD FINDINGS Anterior circulation: Atherosclerotic calcifications are present within the cavernous internal carotid arteries bilaterally without significant stenosis through the ICA termini. The A1 and M1 segments are normal. The anterior communicating artery is patent. The MCA bifurcations are within normal limits bilaterally. Moderate stenosis is present in the proximal inferior right M2 segment. Asymmetric attenuation of posterior right MCA branches is noted. The more distal anterior superior right M3 stenosis is present. No branch vessel occlusion is present. The left MCA branch  vessels and bilateral ACA branch vessels are normal. Posterior circulation: PICA origins are visualized and normal bilaterally. The vertebrobasilar junction is normal. Moderate proximal basilar artery stenosis is present. Basilar artery is small, centrally terminating at the superior cerebellar arteries. Fetal type posterior cerebral arteries are present bilaterally. Moderate stenoses are present in the left P1 and left P2 segment. More mild right P2 segment narrowing is present. No vessel occlusion is present. No aneurysm is present. Venous sinuses: The dural sinuses are patent. The straight sinus and deep cerebral veins are intact. Cortical veins are within normal limits. No significant vascular malformation is evident. Anatomic variants: Fetal type posterior cerebral arteries bilaterally. Review of the MIP images confirms the above findings IMPRESSION: 1. No large vessel occlusion. 2. Moderate stenosis of the proximal  inferior right M2 segment. 3. More distal anterior superior right M3 stenosis. 4. Moderate proximal basilar artery stenosis. 5. Moderate stenoses in the left P1 and left P2 segment. 6. More mild right P2 segment narrowing. 7. Atherosclerotic changes at the carotid bifurcations and cavernous internal carotid arteries bilaterally without significant stenosis. 8. Mild degenerative changes of the cervical spine, most evident at C3-4 and C5-6. 9.  Aortic Atherosclerosis (ICD10-I70.0). The above was relayed via text pager to Dr. Ritta Slot on 06/07/2023 at 12:17 . Electronically Signed   By: Marin Roberts M.D.   On: 06/07/2023 12:26   CT HEAD CODE STROKE WO CONTRAST Result Date: 06/07/2023 CLINICAL DATA:  Code stroke. Neuro deficit, acute, stroke suspected. Last known normal less than 1 hour ago. Left-sided weakness. EXAM: CT HEAD WITHOUT CONTRAST TECHNIQUE: Contiguous axial images were obtained from the base of the skull through the vertex without intravenous contrast. RADIATION DOSE  REDUCTION: This exam was performed according to the departmental dose-optimization program which includes automated exposure control, adjustment of the mA and/or kV according to patient size and/or use of iterative reconstruction technique. COMPARISON:  MR head without contrast 12/02/2014 FINDINGS: Brain: Acute/subacute infarct is present in the right parietal lobe. Loss of gray-white differentiation present at both the ganglionic and super ganglionic levels of the right parietal lobe. Insular ribbon is intact. Basal ganglia are within normal limits. Moderate generalized atrophy and white matter disease is present otherwise. The ventricles are proportionate to the degree of atrophy. No significant extraaxial fluid collection is present. Vascular: Atherosclerotic calcifications are present at the cavernous internal carotid arteries bilaterally. No hyperdense vessel is present. Skull: Calvarium is intact. No focal lytic or blastic lesions are present. No significant extracranial soft tissue lesion is present. Sinuses/Orbits: The paranasal sinuses and mastoid air cells are clear. Bilateral lens replacements are noted. Globes and orbits are otherwise unremarkable. ASPECTS [facility] Stroke Program Early CT Score) - Ganglionic level infarction (caudate, lentiform nuclei, internal capsule, insula, M1-M3 cortex): 6/7 - Supraganglionic infarction (M4-M6 cortex): 2/3 Total score (0-10 with 10 being normal): 8/10 IMPRESSION: 1. Acute/subacute infarct in the right parietal lobe with loss of gray-white differentiation at both the ganglionic and super ganglionic levels of the right parietal lobe. 2. Aspects is 8/10. 3. Moderate generalized atrophy and white matter disease likely reflects the sequela of chronic microvascular ischemia. The above was relayed via text pager to Dr. Amada Jupiter on 06/07/2023 at 12:07 . Electronically Signed   By: Marin Roberts M.D.   On: 06/07/2023 12:11    Procedures Procedures     Medications Ordered in ED Medications  sodium chloride flush (NS) 0.9 % injection 3 mL (has no administration in time range)  iohexol (OMNIPAQUE) 350 MG/ML injection 100 mL (100 mLs Intravenous Contrast Given 06/07/23 1201)    ED Course/ Medical Decision Making/ A&P                                 Medical Decision Making Amount and/or Complexity of Data Reviewed Labs: ordered. Radiology: ordered.  Risk Prescription drug management. Decision regarding hospitalization.    89 year old female with medical history significant for hypertension, hyperlipidemia, GERD, biventricular pacemaker in place, nonischemic cardiomyopathy, DVT on Eliquis presenting to the ER with a facial droop after a fall. The patient got up this morning and and sustained a fall getting out of bed. She fell after getting out of bed, unclear head trauma or LOC. The patient cannot  remember the fall. Her last dose of Eliquis was this morning. Last known normal when she went to bed last night. On arrival, CGB was 151. The patient reportedly had L facial droop and L hemibody weakness after the fall which appears to be resolving, however, given the time frame, a code stroke was called on patient arrival.  On arrival, initial CBG was 151, blood pressure 167/81.  The patient's airway was cleared and she was taken to the CT scanner for code stroke imaging. No evidence of trauma noted on primary or secondary survey.  CT Head Code Stroke: IMPRESSION:  1. Acute/subacute infarct in the right parietal lobe with loss of  gray-white differentiation at both the ganglionic and super  ganglionic levels of the right parietal lobe.  2. Aspects is 8/10.  3. Moderate generalized atrophy and white matter disease likely  reflects the sequela of chronic microvascular ischemia.    The above was relayed via text pager to Dr. Amada Jupiter on 06/07/2023  at 12:07 .   CTA Head and Neck: IMPRESSION:  1. No large vessel occlusion.  2.  Moderate stenosis of the proximal inferior right M2 segment.  3. More distal anterior superior right M3 stenosis.  4. Moderate proximal basilar artery stenosis.  5. Moderate stenoses in the left P1 and left P2 segment.  6. More mild right P2 segment narrowing.  7. Atherosclerotic changes at the carotid bifurcations and cavernous  internal carotid arteries bilaterally without significant stenosis.  8. Mild degenerative changes of the cervical spine, most evident at  C3-4 and C5-6.  9.  Aortic Atherosclerosis (ICD10-I70.0).    The above was relayed via text pager to Dr. Ritta Slot on  06/07/2023 at 12:17 .   CXR: IMPRESSION:  No acute cardiopulmonary abnormality or acute traumatic injury  identified.    Per Dr. Amada Jupiter of neurology, hold Eliquis tonight, admit for subacute CVA. Hospitalist medicine consulted for admission, Dr. Allena Katz accepting.   Final Clinical Impression(s) / ED Diagnoses Final diagnoses:  Cerebrovascular accident (CVA), unspecified mechanism (HCC)    Rx / DC Orders ED Discharge Orders     None         Ernie Avena, MD 06/07/23 1315

## 2023-06-07 NOTE — Progress Notes (Signed)
 Admission Request : Subacute cva per neurology LKN : 7:30 PM last night. Left sided hemiplegia. When pt woke up she fell on left side.  AMS.   Vitals:   06/07/23 1155  BP: (!) 167/81  Pulse: 70  Temp: 98.9 F (37.2 C)  Resp: 20  SpO2: 96%  TempSrc: Oral   Results for orders placed or performed during the hospital encounter of 06/07/23 (from the past 48 hours)  POC CBG, ED     Status: Abnormal   Collection Time: 06/07/23 11:56 AM  Result Value Ref Range   Glucose-Capillary 151 (H) 70 - 99 mg/dL    Comment: Glucose reference range applies only to samples taken after fasting for at least 8 hours.  Protime-INR     Status: Abnormal   Collection Time: 06/07/23 12:02 PM  Result Value Ref Range   Prothrombin Time 16.5 (H) 11.4 - 15.2 seconds   INR 1.3 (H) 0.8 - 1.2    Comment: (NOTE) INR goal varies based on device and disease states. Performed at [facility], [address], [place], [place] [address]   APTT     Status: None   Collection Time: 06/07/23 12:02 PM  Result Value Ref Range   aPTT 28 24 - 36 seconds    Comment: Performed at [facility], [address] [place], [place] [address]  CBC     Status: Abnormal   Collection Time: 06/07/23 12:02 PM  Result Value Ref Range   WBC 14.4 (H) 4.0 - 10.5 K/uL   RBC 4.36 3.87 - 5.11 MIL/uL   Hemoglobin 12.8 12.0 - 15.0 g/dL   HCT 09.8 11.9 - 14.7 %   MCV 88.1 80.0 - 100.0 fL   MCH 29.4 26.0 - 34.0 pg   MCHC 33.3 30.0 - 36.0 g/dL   RDW 82.9 56.2 - 13.0 %   Platelets 331 150 - 400 K/uL   nRBC 0.0 0.0 - 0.2 %    Comment: Performed at [facility], [address], [place], [place] [address]  Differential     Status: Abnormal   Collection Time: 06/07/23 12:02 PM  Result Value Ref Range   Neutrophils Relative % 88 %   Neutro Abs 12.6 (H) 1.7 - 7.7 K/uL   Lymphocytes Relative 8 %   Lymphs Abs 1.1 0.7 - 4.0 K/uL   Monocytes Relative 4 %   Monocytes Absolute 0.6 0.1 -  1.0 K/uL   Eosinophils Relative 0 %   Eosinophils Absolute 0.0 0.0 - 0.5 K/uL   Basophils Relative 0 %   Basophils Absolute 0.0 0.0 - 0.1 K/uL   Immature Granulocytes 0 %   Abs Immature Granulocytes 0.05 0.00 - 0.07 K/uL    Comment: Performed at [facility], [address] [place], [place] [address]  Comprehensive metabolic panel     Status: Abnormal   Collection Time: 06/07/23 12:02 PM  Result Value Ref Range   Sodium 141 135 - 145 mmol/L   Potassium 3.7 3.5 - 5.1 mmol/L   Chloride 105 98 - 111 mmol/L   CO2 24 22 - 32 mmol/L   Glucose, Bld 153 (H) 70 - 99 mg/dL    Comment: Glucose reference range applies only to samples taken after fasting for at least 8 hours.   BUN 12 8 - 23 mg/dL   Creatinine, Ser 9.52 0.44 - 1.00 mg/dL   Calcium 9.2 8.9 - 84.1 mg/dL   Total Protein 6.7 6.5 - 8.1 g/dL  Albumin 3.9 3.5 - 5.0 g/dL   AST 20 15 - 41 U/L   ALT 11 0 - 44 U/L   Alkaline Phosphatase 41 38 - 126 U/L   Total Bilirubin 0.4 0.0 - 1.2 mg/dL   GFR, Estimated >52 >84 mL/min    Comment: (NOTE) Calculated using the CKD-EPI Creatinine Equation (2021)    Anion gap 12 5 - 15    Comment: Performed at [facility], [address], [place], [place] [address]  Ethanol     Status: None   Collection Time: 06/07/23 12:02 PM  Result Value Ref Range   Alcohol, Ethyl (B) <10 <10 mg/dL    Comment: REPEATED TO VERIFY (NOTE) Lowest detectable limit for serum alcohol is 10 mg/dL.  For medical purposes only. Performed at [facility], [address] [place], [place] [address]     Pt accepted to med tele bed.  PER NEURO HOLD ELIQUIS.  Please see consult note.

## 2023-06-07 NOTE — ED Notes (Signed)
Madison [name] with cl called for transport

## 2023-06-07 NOTE — Progress Notes (Signed)
 1146 arrival POV 1151 elert and edp assigned, [facility] neuro paged 1152 Dr. Amada Jupiter on screen 1153 down to ct 1209 back from ct  1221 Dr. Amada Jupiter aware of no lvo and needs nothing further from telestroke. Off cart.  LKW night before, mrs 4, on eliquis (taken this morning)  Left sided weakness. NCCT showing acute to subacute right parietal per rad read.

## 2023-06-07 NOTE — Progress Notes (Signed)
 Paged admitting for patient arrival to unit

## 2023-06-07 NOTE — ED Notes (Signed)
 As I write this, she is in CT with my colleague, Fredricka Bonine.

## 2023-06-07 NOTE — H&P (Signed)
 History and Physical    Patient: Madison [name] ZOX:[id number] DOB: 1928/11/18 DOA: 06/07/2023 DOS: the patient was seen and examined on 06/07/2023 PCP: Willow Ora, MD  Patient coming from: Home  Chief Complaint:  Chief Complaint  Patient presents with   Code Stroke   HPI: Madison [name] is a 89 y.o. female with medical history significant history of DVT on Eliquis, nonischemic cardiomyopathy, pacemaker, hyperlipidemia and hypertension.  The patient had a witnessed fall getting out of bed this morning which is unusual for her.  She seemed just fine after the fall and was able to walk to the bathroom with help and had some breakfast and sit in the chair.  The patient lives with her daughter who check on her frequently through the morning.  She seemed to be at her baseline other than the fact that she slept a lot this morning.  At about 1030 The patient got herself up to go to the bathroom she was walking with her walker and the daughter came in and saw at that moment the patient seemed to have a facial droop.  She also was not holding her walker properly instead of having her hand around the walker she just had her fist sitting on top of the walker.  The patient was also confused and talking about a chair that was not there.  All of these things created panic and the patient's daughter who took her mother to the car immediately and brought her to the ER.  In the emergency department a code stroke was called.  The there was concern that the patient's last known well was last night so she did not meet criteria for intervention.  Per the neurology report her scan showed a moderate-sized right CVA.  The patient will be admitted to the hospitalist service.  Review of Systems: unable to review all systems due to the inability of the patient to answer questions. 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   History of blood transfusion    History of rheumatic fever    HTN (hypertension)    hx of refusing treatment--[name] coat HTN and/or situational HTN (?)    Hyperlipidemia    hx of refusing treatment   Insulin resistance    A1c's excellent per old records (6.1 in 2008 and 2009)   Left bundle branch block 12/2011   Dr. Royann Shivers at [facility] H[facility]; ECHO and myocardial perfusion scan showed  septal and apical wall motion abnormality but she had no significant valvular disease and no ischemia.  She did have mildly decreased EF (39% on lexiscan and 50% on echo) and abnl LV relaxation.  Mild amount of PVCs.  Pt at higher risk for other conduction abnormalities, good chance of eventually requiring a pacemaker.    LVH (left ventricular hypertrophy)    Nonischemic cardiomyopathy (HCC) 2016   LV dysfunction due to LBBB/septal dyssynchrony   Osteoarthritis    bilat knees--needs bilat TKA.  Ortho is trying steroid injections as of 10/23/15.   Presence of biventricular cardiac pacemaker 04/05/2019   Presence of permanent cardiac pacemaker 05/05/2017  Seasonal allergies    Solitary pulmonary nodule 05/2016   8 mm pleural based nodule in RLL.  Radiology recommended f/u noncontrast CT in 6-12 mo.   Past Surgical History:  Procedure Laterality Date   ABDOMINAL HYSTERECTOMY  1963   At the time of her last C/S; says she had partial bladder resection at that time as well   BI-VENTRICULAR PACEMAKER INSERTION (CRT-P)  05/05/2017   BIV PACEMAKER INSERTION CRT-P N/A 05/05/2017   Procedure: BIV PACEMAKER INSERTION CRT-P;  Surgeon: Marinus Maw, MD;  Location: [facility] INVASIVE CV LAB;  Service: Cardiovascular;  Laterality: N/A;    CARDIOVASCULAR STRESS TEST  08/2005 & 12/2011   Low risk scans (on the 12/2011 scan she did have EF 39% with moderately severe LV dysfunction with septal dyssynergy probably contributed by LBBB   CARDIOVERSION N/A 08/09/2019   Procedure: CARDIOVERSION;  Surgeon: Chrystie Nose, MD;  Location: [facility] ENDOSCOPY;  Service: Cardiovascular;  Laterality: N/A;   Carotid dopplers  11/22/14; 11/30/16   NORMAL 2016 and 2018   CATARACT EXTRACTION  08/14/11   both   CESAREAN SECTION  X 5   One of her neonates died soon after birth   CHOLECYSTECTOMY     1980's   COLONOSCOPY WITH PROPOFOL N/A 09/02/2017   Procedure: COLONOSCOPY WITH PROPOFOL;  Surgeon: Meryl Dare, MD;  Location: [facility] ENDOSCOPY;  Service: Endoscopy;  Laterality: N/A;   HOT HEMOSTASIS N/A 09/02/2017   Procedure: HOT HEMOSTASIS (ARGON PLASMA COAGULATION/BICAP);  Surgeon: Meryl Dare, MD;  Location: [facility] ENDOSCOPY;  Service: Endoscopy;  Laterality: N/A;   INSERT / REPLACE / REMOVE PACEMAKER     JOINT REPLACEMENT Bilateral    knee   right knee surgery Right 05/2016   ? R TKA: no records.   STRABISMUS SURGERY  1939   TONSILLECTOMY  1938   TRANSTHORACIC ECHOCARDIOGRAM  01/13/12   Septal dyssynergy, EF 50%, LV relaxation impaired.  No significant valvular abnormalities.   Social History:  reports that she quit smoking about 65 years ago. Her smoking use included cigarettes. She has never used smokeless tobacco. She reports that she does not currently use alcohol after a past usage of about 1.0 standard drink of alcohol per week. She reports that she does not use drugs.  Allergies  Allergen Reactions   Penicillins Hives and Swelling    Has patient had a PCN reaction causing immediate rash, facial/tongue/throat swelling, SOB or lightheadedness with hypotension: Yes Has patient had a PCN reaction causing severe rash involving mucus membranes or skin necrosis: No Has patient had a PCN reaction that required hospitalization: No Has patient had a  PCN reaction occurring within the last 10 years: No If all of the above answers are "NO", then may proceed with Cephalosporin use.    Nickel Swelling and Rash   Avapro [Irbesartan] Other (See Comments)    Unknown rxn   Lisinopril Swelling    Swelling around eyes; also says it caused increased sugar and BP   Simvastatin Other (See Comments)    unknown   Latex Rash   Sulfa Antibiotics Rash   Sulfamethoxazole Rash    Family History  Problem Relation Age of Onset   Hypertension Mother    Heart disease Mother    Hyperlipidemia Mother    Diabetes Mother    Heart disease Father    Cancer Father     Prior to Admission medications   Medication Sig Start Date End Date Taking? Authorizing Provider  calcium-vitamin D Ruthell Rummage WITH  D) 500-200 MG-UNIT tablet Take 1 tablet by mouth.    [provider]  Cholecalciferol 25 MCG (1000 UT) tablet Take 1,000 Units by mouth daily.    [provider]  cyanocobalamin 100 MCG tablet Take by mouth daily.    [provider]  donepezil (ARICEPT) 10 MG tablet TAKE 1 TABLET BY MOUTH EVERYDAY AT BEDTIME 04/22/23   Willow Ora, MD  ELDERBERRY PO Take 1 tablet by mouth daily in the afternoon. Chew Gummie    [provider]  ELIQUIS 5 MG TABS tablet TAKE 1 TABLET BY MOUTH TWICE A DAY 04/22/23   Willow Ora, MD  Iron-Vitamin C 100-250 MG TABS Take by mouth.    [provider]  metoprolol tartrate (LOPRESSOR) 25 MG tablet TAKE 1 TABLET BY MOUTH TWICE A DAY Patient taking differently: Take 12.5 mg by mouth 2 (two) times daily. 12/07/22   Willow Ora, MD  MYRBETRIQ 25 MG TB24 tablet TAKE 1 TABLET (25 MG TOTAL) BY MOUTH DAILY. 08/04/22   Willow Ora, MD  omeprazole (PRILOSEC) 20 MG capsule TAKE 1 CAPSULE (20 MG TOTAL) BY MOUTH DAILY. PLEASE SCHEDULE FOLLOW UP APPT WITH DR. Mardelle Matte FOR FURTHER REFILLS 05/01/20   Willow Ora, MD  PEDIATRIC MULTIVITAMINS-IRON PO Take 1 tablet by mouth daily in the afternoon. Chew  gummie    [provider]    Physical Exam: Vitals:   06/07/23 1538 06/07/23 1700 06/07/23 1917 06/07/23 2008  BP:  (!) 164/73 (!) 147/66 (!) 140/61  Pulse:  72 74 70  Resp:  20 16 18   Temp: 98.9 F (37.2 C)  100.2 F (37.9 C) 98.7 F (37.1 C)  TempSrc: Oral  Oral Oral  SpO2:  100% 100% 99%   Physical Exam:  General: No acute distress, well developed, well nourished HEENT: Normocephalic, atraumatic, PERRL Cardiovascular: Normal rate and rhythm. Distal pulses intact. Pulmonary: Normal pulmonary effort, normal breath sounds Gastrointestinal: Nondistended abdomen, soft, non-tender, normoactive bowel sounds, no organomegaly Musculoskeletal:Normal ROM, no lower ext edema Lymphadenopathy: No cervical LAD. Skin: Skin is warm and dry. Neuro:  AAOx1, left arm weakness and uncoordination. when she squeezes my hand she has a hard time letting go.  She has past pointing trying to find her nose. Facial droop seems to be resolved. PSYCH: Follows simple commands after being reminded a few times.  Data Reviewed:  Results for orders placed or performed during the hospital encounter of 06/07/23 (from the past 24 hours)  POC CBG, ED     Status: Abnormal   Collection Time: 06/07/23 11:56 AM  Result Value Ref Range   Glucose-Capillary 151 (H) 70 - 99 mg/dL  Protime-INR     Status: Abnormal   Collection Time: 06/07/23 12:02 PM  Result Value Ref Range   Prothrombin Time 16.5 (H) 11.4 - 15.2 seconds   INR 1.3 (H) 0.8 - 1.2  APTT     Status: None   Collection Time: 06/07/23 12:02 PM  Result Value Ref Range   aPTT 28 24 - 36 seconds  CBC     Status: Abnormal   Collection Time: 06/07/23 12:02 PM  Result Value Ref Range   WBC 14.4 (H) 4.0 - 10.5 K/uL   RBC 4.36 3.87 - 5.11 MIL/uL   Hemoglobin 12.8 12.0 - 15.0 g/dL   HCT 16.1 09.6 - 04.5 %   MCV 88.1 80.0 - 100.0 fL   MCH 29.4 26.0 - 34.0 pg   MCHC 33.3 30.0 - 36.0 g/dL  RDW 14.4 11.5 - 15.5 %   Platelets 331 150 - 400 K/uL    nRBC 0.0 0.0 - 0.2 %  Differential     Status: Abnormal   Collection Time: 06/07/23 12:02 PM  Result Value Ref Range   Neutrophils Relative % 88 %   Neutro Abs 12.6 (H) 1.7 - 7.7 K/uL   Lymphocytes Relative 8 %   Lymphs Abs 1.1 0.7 - 4.0 K/uL   Monocytes Relative 4 %   Monocytes Absolute 0.6 0.1 - 1.0 K/uL   Eosinophils Relative 0 %   Eosinophils Absolute 0.0 0.0 - 0.5 K/uL   Basophils Relative 0 %   Basophils Absolute 0.0 0.0 - 0.1 K/uL   Immature Granulocytes 0 %   Abs Immature Granulocytes 0.05 0.00 - 0.07 K/uL  Comprehensive metabolic panel     Status: Abnormal   Collection Time: 06/07/23 12:02 PM  Result Value Ref Range   Sodium 141 135 - 145 mmol/L   Potassium 3.7 3.5 - 5.1 mmol/L   Chloride 105 98 - 111 mmol/L   CO2 24 22 - 32 mmol/L   Glucose, Bld 153 (H) 70 - 99 mg/dL   BUN 12 8 - 23 mg/dL   Creatinine, Ser 1.61 0.44 - 1.00 mg/dL   Calcium 9.2 8.9 - 09.6 mg/dL   Total Protein 6.7 6.5 - 8.1 g/dL   Albumin 3.9 3.5 - 5.0 g/dL   AST 20 15 - 41 U/L   ALT 11 0 - 44 U/L   Alkaline Phosphatase 41 38 - 126 U/L   Total Bilirubin 0.4 0.0 - 1.2 mg/dL   GFR, Estimated >04 >54 mL/min   Anion gap 12 5 - 15  Ethanol     Status: None   Collection Time: 06/07/23 12:02 PM  Result Value Ref Range   Alcohol, Ethyl (B) <10 <10 mg/dL     Assessment and Plan: Acute moderate Rt CVA -  Appreciate neuro recommendations as follows: Aspirin 81 mg daily Echo, telemetry PT, OT, ST Permissive hypertension to 220/120 Hold Eliquis  2. Dementia - Patient is pleasant but alert and oriented to name only She says it is 31 and she can says she is in [place] but is unable to name which city. Even after a full discussion about why she is in the hospital and about the stroke she was not able to say why she was in the hospital.  She was not able to say which hospital she was in.      Advance Care Planning:   Code Status: Prior the patient has 5 children.  There is 1 daughter who is  her healthcare power of attorney.  She reports that the patient is DNR.  Consults: Neurology  Family Communication: Spoke to 2 daughters and 1 son at bedside  Severity of Illness: The appropriate patient status for this patient is INPATIENT. Inpatient status is judged to be reasonable and necessary in order to provide the required intensity of service to ensure the patient's safety. The patient's presenting symptoms, physical exam findings, and initial radiographic and laboratory data in the context of their chronic comorbidities is felt to place them at high risk for further clinical deterioration. Furthermore, it is not anticipated that the patient will be medically stable for discharge from the hospital within 2 midnights of admission.   * I certify that at the point of admission it is my clinical judgment that the patient will require inpatient hospital care spanning beyond 2 midnights from  the point of admission due to high intensity of service, high risk for further deterioration and high frequency of surveillance required.*  Author: Buena Irish, MD 06/07/2023 11:32 PM  For on call review [email or web address].

## 2023-06-07 NOTE — Consult Note (Signed)
 [facility] Telemedicine Consult   Requesting Provider: Phillips Grout Consult Participants: Bedside nursing, telestroke nursing Location of the provider: [facility] Location of the patient: [facility]  This consult was provided via telemedicine with 2-way video and audio communication. The patient/family was informed that care would be provided in this way and agreed to receive care in this manner.    Chief Complaint: Left-sided weakness  HPI: With history of atrial fibrillation who takes Eliquis, last dose this morning who presents with left-sided weakness.  She fell as she was getting out of bed for the first time this morning, and was uncertain of what caused her to fall.  She was then in bed and seemed relatively normal but then went to sleep about an hour ago and when she awoke it was noted that she was having difficulty with her left side and that is the reason for her visit to the emergency department this morning.    LKW: Likely 3/22 prior to bed tnk given?: No, Eliquis IR Thrombectomy? No, no LVO Modified Rankin Scale: 4-Needs assistance to walk and tend to bodily needs Time of teleneurologist evaluation: 11:52  Exam: There were no vitals filed for this visit.   General: in bed, nad  1A: Level of Consciousness - 0 1B: Ask Month and Age - 2 1C: 'Blink Eyes' & 'Squeeze Hands' - 0 2: Test Horizontal Extraocular Movements - 0 3: Test Visual Fields - 1(she appears to have a partial left field cut) 4: Test Facial Palsy - 1 5A: Test Left Arm Motor Drift - 1 5B: Test Right Arm Motor Drift - 0 6A: Test Left Leg Motor Drift - 1 6B: Test Right Leg Motor Drift - 0 7: Test Limb Ataxia - 0 8: Test Sensation - 0 9: Test Language/Aphasia-  10: Test Dysarthria - 0 11: Test Extinction/Inattention - 1 NIHSS score: 7   Imaging Reviewed: CT head-right parietal stroke  Labs reviewed in epic and pertinent values follow: CBG 151   Assessment: 89 year old female  with moderate-sized right MCA stroke.  She will need to be admitted for therapy evaluation.  Given the size of her stroke, I would favor holding Eliquis for the time being and substituting aspirin monotherapy.  Recommendations:  Aspirin 81 mg daily Echo, telemetry PT, OT, ST Permissive hypertension to 220/120 Stroke team will follow at [facility], [place] [facility] [phone]  If 7pm- 7am, please page neurology on call as listed in AMION.

## 2023-06-08 ENCOUNTER — Inpatient Hospital Stay (HOSPITAL_COMMUNITY)

## 2023-06-08 DIAGNOSIS — I63412 Cerebral infarction due to embolism of left middle cerebral artery: Secondary | ICD-10-CM

## 2023-06-08 DIAGNOSIS — I6310 Cerebral infarction due to embolism of unspecified precerebral artery: Secondary | ICD-10-CM | POA: Diagnosis not present

## 2023-06-08 DIAGNOSIS — I6389 Other cerebral infarction: Secondary | ICD-10-CM

## 2023-06-08 LAB — LIPID PANEL
Cholesterol: 160 mg/dL (ref 0–200)
HDL: 61 mg/dL (ref >40)
LDL Cholesterol: 82 mg/dL (ref 0–99)
Total CHOL/HDL Ratio: 2.6 ratio
Triglycerides: 85 mg/dL (ref <150)
VLDL: 17 mg/dL (ref 0–40)

## 2023-06-08 LAB — ECHOCARDIOGRAM COMPLETE
AR max vel: 1.56 cm2
AV Area VTI: 1.38 cm2
AV Area mean vel: 1.62 cm2
AV Mean grad: 6 mmHg
AV Peak grad: 10.5 mmHg
Ao pk vel: 1.62 m/s
Area-P 1/2: 3.65 cm2
S' Lateral: 3.2 cm

## 2023-06-08 LAB — HEMOGLOBIN A1C
Hgb A1c MFr Bld: 5.5 % (ref 4.8–5.6)
Mean Plasma Glucose: 111.15 mg/dL

## 2023-06-08 NOTE — Progress Notes (Signed)
  Echocardiogram 2D Echocardiogram has been performed.  Leda Roys RDCS 06/08/2023, 3:09 PM

## 2023-06-08 NOTE — Progress Notes (Signed)
 PT Cancellation Note  Patient Details Name: Madison White MRN: [id number] DOB: 1928/03/18   Cancelled Treatment:    Reason Eval/Treat Not Completed: Fatigue/lethargy limiting ability to participate; RN reports pt just now asleep and requesting PT return later to give her a chance to rest.  Will continue attempts.   Sheran Lawless, PT Acute Rehabilitation Services Office:[phone] 06/08/2023    [name] 06/08/2023, 10:02 AM

## 2023-06-08 NOTE — Evaluation (Signed)
 Occupational Therapy Evaluation Patient Details Name: Madison White MRN: [id number] DOB: 22-Oct-1928 Today's Date: 06/08/2023   History of Present Illness   Patient is a 89 y/o female admitted 06/07/23 with fall at home and noted L side weakness and facial droop.  CTH showed moderate size R CVA.  PMH positive for h/o DVT on Eliquis, NICM, PPM, HLD, HTN.     Clinical Impressions Prior to admission patient was receiving supervision due to cognition and min assist with her bathing and dressing.  Currently patient is min to mod assist with adls, experiencing decreased gross and fine motor coordination in the LUE and L inattention and decreased balance.   Noted a delay with identifying object when testing L upper and lower fields.  Patient will benefit from further visual testing.  Patient will benefit from intensive inpatient follow-up therapy, >3 hours/day and continued acute OT to facilitate d/c.     If plan is discharge home, recommend the following:   A little help with walking and/or transfers;A little help with bathing/dressing/bathroom     Functional Status Assessment   Patient has had a recent decline in their functional status and demonstrates the ability to make significant improvements in function in a reasonable and predictable amount of time.     Equipment Recommendations   None recommended by OT     Recommendations for Other Services   Rehab consult     Precautions/Restrictions   Precautions Precautions: Fall Precaution/Restrictions Comments: L inattention Restrictions Weight Bearing Restrictions Per Provider Order: No     Mobility Bed Mobility                    Transfers Overall transfer level: Needs assistance Equipment used: Rolling walker (2 wheels), 1 person hand held assist Transfers: Sit to/from Stand Sit to Stand: Min assist                  Balance Overall balance assessment: Needs assistance Sitting-balance support: Feet  supported, No upper extremity supported Sitting balance-Leahy Scale: Good                                     ADL either performed or assessed with clinical judgement   ADL Overall ADL's : Needs assistance/impaired Eating/Feeding: Set up   Grooming: Minimal assistance   Upper Body Bathing: Moderate assistance   Lower Body Bathing: Maximal assistance   Upper Body Dressing : Moderate assistance   Lower Body Dressing: Maximal assistance   Toilet Transfer: Moderate assistance           Functional mobility during ADLs: Minimal assistance;Rolling walker (2 wheels);Cueing for safety       Vision Baseline Vision/History: 0 No visual deficits;1 Wears glasses Ability to See in Adequate Light: 0 Adequate Patient Visual Report: No change from baseline Vision Assessment?: Yes Eye Alignment: Within Functional Limits Ocular Range of Motion: Within Functional Limits Tracking/Visual Pursuits: Decreased smoothness of horizontal tracking Visual Fields: Impaired-to be further tested in functional context (Delayed in her L upper and lower fields - needs further testing) Additional Comments: Able to read paragraph from Stroke book without difficulty     Perception         Praxis         Pertinent Vitals/Pain Pain Assessment Pain Assessment: No/denies pain     Extremity/Trunk Assessment Upper Extremity Assessment Upper Extremity Assessment: Right hand dominant;Overall [facility] for tasks assessed LUE  Deficits / Details: AROM to ~ 170 flexion, can reach behind her head and back.  Tending to fist her L hand during activity.  Decreased FMC with activity.  Patient is not always aware of where the UE is during activity LUE Coordination: decreased gross motor;decreased fine motor   Lower Extremity Assessment Lower Extremity Assessment: Defer to PT evaluation LLE Deficits / Details: mildly decreased compared to R esp hip flexion 3+/5 and ankle DF WFL though only with cueing    Cervical / Trunk Assessment Cervical / Trunk Assessment: Kyphotic   Communication Communication Communication: No apparent difficulties   Cognition Arousal: Alert Behavior During Therapy: WFL for tasks assessed/performed Cognition: History of cognitive impairments                               Following commands: Intact       Cueing  General Comments   Cueing Techniques: Verbal cues  Son and daughter present for evaluation   Exercises     Shoulder Instructions      Home Living Family/patient expects to be discharged to:: Private residence Living Arrangements: Children Available Help at Discharge: Family Type of Home: House Home Access: Stairs to enter Secretary/administrator of Steps: 1   Home Layout: Two level;Able to live on main level with bedroom/bathroom     Bathroom Shower/Tub: Producer, television/film/video: Standard     Home Equipment: Agricultural consultant (2 wheels);Shower seat;Hand held shower head;Grab bars - tub/shower   Additional Comments: stool with rails at the bed for bed entry      Prior Functioning/Environment Prior Level of Function : Needs assist  Cognitive Assist : ADLs (cognitive)     Physical Assist : ADLs (physical)       ADLs Comments: Daughter stated she has been assisting patient with adls - step by step for dressing and bathing    OT Problem List: Decreased strength;Decreased activity tolerance;Impaired balance (sitting and/or standing);Impaired vision/perception;Decreased cognition;Decreased safety awareness;Impaired UE functional use   OT Treatment/Interventions: Self-care/ADL training;Therapeutic exercise;Neuromuscular education;Therapeutic activities;Patient/family education;Balance training      OT Goals(Current goals can be found in the care plan section)   Acute Rehab OT Goals OT Goal Formulation: With patient/family Time For Goal Achievement: 06/22/23 Potential to Achieve Goals: Good   OT Frequency:   Min 2X/week    Co-evaluation              AM-PAC OT "6 Clicks" Daily Activity     Outcome Measure Help from another person eating meals?: A Little Help from another person taking care of personal grooming?: A Little Help from another person toileting, which includes using toliet, bedpan, or urinal?: A Little Help from another person bathing (including washing, rinsing, drying)?: A Little Help from another person to put on and taking off regular upper body clothing?: A Little Help from another person to put on and taking off regular lower body clothing?: A Little 6 Click Score: 18   End of Session Nurse Communication: Mobility status  Activity Tolerance: Patient tolerated treatment well Patient left: in chair;with call bell/phone within reach;with family/visitor present  OT Visit Diagnosis: Unsteadiness on feet (R26.81);Muscle weakness (generalized) (M62.81);Other symptoms and signs involving cognitive function;Hemiplegia and hemiparesis Hemiplegia - Right/Left: Left Hemiplegia - dominant/non-dominant: Non-Dominant Hemiplegia - caused by: Cerebral infarction                Time: 1610-9604 OT Time Calculation (min): 29 min Charges:  OT General Charges $OT Visit: 1 Visit OT Evaluation $OT Eval Low Complexity: 1 Low OT Treatments $Self Care/Home Management : 8-22 mins Hal Neer OTR/L   Malachi Bonds 06/08/2023, 2:39 PM

## 2023-06-08 NOTE — Evaluation (Signed)
 Speech Language Pathology Evaluation Patient Details Name: Madison White MRN: [id number] DOB: 1928-10-18 Today's Date: 06/08/2023 Time: 7829-5621 SLP Time Calculation (min) (ACUTE ONLY): 27 min  Problem List:  Patient Active Problem List   Diagnosis Date Noted   AMS (altered mental status) 06/07/2023   Stroke (HCC) 06/07/2023   Seborrheic keratoses 12/03/2022   Telangiectasia 12/03/2022   Long-term current use of proton pump inhibitor therapy 04/09/2022   Current use of long term anticoagulation 04/09/2022   Complete heart block (HCC) 12/18/2021   Vascular dementia (HCC) 06/18/2021   Overactive bladder 03/19/2021   History of DVT of lower extremity left 12/27/2019   Persistent atrial fibrillation (HCC) 08/01/2019   Presence of biventricular cardiac pacemaker 04/05/2019   Recurrent nephrolithiasis 06/17/2017   History of complete heart block 05/05/2017   Renal cyst 03/01/2017   Hydronephrosis with urinary obstruction due to renal calculus 03/01/2017   Total knee replacement status, bilateral 11/06/2016   Nonischemic cardiomyopathy (HCC) 11/17/2014   Upper airway cough syndrome 02/26/2014   Constipation, chronic 02/26/2014   GERD (gastroesophageal reflux disease) 11/19/2012   Dyslipidemia 09/02/2012   Left bundle branch block 12/18/2011   Essential hypertension 05/31/2011   Past Medical History:  Past Medical History:  Diagnosis Date   Acute lower GI bleeding    Diastolic dysfunction, left ventricle    GERD (gastroesophageal reflux disease)    Headache disorder 2016   R frontoparietal pain, episodic (paroxysmal hemicrania vs trig neuralgia of ophth br of CN V)--MRI brain 11/2014 showed age related changes but no explanation for her HA's.  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   History of blood transfusion    History of rheumatic fever    HTN (hypertension)    hx of refusing treatment--White coat HTN and/or situational HTN (?)     Hyperlipidemia    hx of refusing treatment   Insulin resistance    A1c's excellent per old records (6.1 in 2008 and 2009)   Left bundle branch block 12/2011   Dr. Royann Shivers at [facility]; ECHO and myocardial perfusion scan showed  septal and apical wall motion abnormality but she had no significant valvular disease and no ischemia.  She did have mildly decreased EF (39% on lexiscan and 50% on echo) and abnl LV relaxation.  Mild amount of PVCs.  Pt at higher risk for other conduction abnormalities, good chance of eventually requiring a pacemaker.    LVH (left ventricular hypertrophy)    Nonischemic cardiomyopathy (HCC) 2016   LV dysfunction due to LBBB/septal dyssynchrony   Osteoarthritis    bilat knees--needs bilat TKA.  Ortho is trying steroid injections as of 10/23/15.   Presence of biventricular cardiac pacemaker 04/05/2019   Presence of permanent cardiac pacemaker 05/05/2017   Seasonal allergies    Solitary pulmonary nodule 05/2016   8 mm pleural based nodule in RLL.  Radiology recommended f/u noncontrast CT in 6-12 mo.   Past Surgical History:  Past Surgical History:  Procedure Laterality Date   ABDOMINAL HYSTERECTOMY  1963   At the time of her last C/S; says she had partial bladder resection at that time as well   BI-VENTRICULAR PACEMAKER INSERTION (CRT-P)  05/05/2017   BIV PACEMAKER INSERTION CRT-P N/A 05/05/2017   Procedure: BIV PACEMAKER INSERTION CRT-P;  Surgeon: Marinus Maw, MD;  Location: [facility] INVASIVE CV LAB;  Service: Cardiovascular;  Laterality: N/A;   CARDIOVASCULAR STRESS TEST  08/2005 & 12/2011   Low risk scans (on the  12/2011 scan she did have EF 39% with moderately severe LV dysfunction with septal dyssynergy probably contributed by LBBB   CARDIOVERSION N/A 08/09/2019   Procedure: CARDIOVERSION;  Surgeon: Chrystie Nose, MD;  Location: [facility] ENDOSCOPY;  Service: Cardiovascular;  Laterality: N/A;   Carotid dopplers  11/22/14; 11/30/16   NORMAL 2016 and 2018   CATARACT EXTRACTION   08/14/11   both   CESAREAN SECTION  X 5   One of her neonates died soon after birth   CHOLECYSTECTOMY     1980's   COLONOSCOPY WITH PROPOFOL N/A 09/02/2017   Procedure: COLONOSCOPY WITH PROPOFOL;  Surgeon: Meryl Dare, MD;  Location[facility] ENDOSCOPY;  Service: Endoscopy;  Laterality: N/A;   HOT HEMOSTASIS N/A 09/02/2017   Procedure: HOT HEMOSTASIS (ARGON PLASMA COAGULATION/BICAP);  Surgeon: Meryl Dare, MD;  Location[facility] ENDOSCOPY;  Service: Endoscopy;  Laterality: N/A;   INSERT / REPLACE / REMOVE PACEMAKER     JOINT REPLACEMENT Bilateral    knee   right knee surgery Right 05/2016   ? R TKA: no records.   STRABISMUS SURGERY  1939   TONSILLECTOMY  1938   TRANSTHORACIC ECHOCARDIOGRAM  01/13/12   Septal dyssynergy, EF 50%, LV relaxation impaired.  No significant valvular abnormalities.   HPI:  Patient is a 89 y/o female admitted 06/07/23 with fall at home and noted L side weakness and facial droop.  CTH showed moderate size R CVA.  PMH positive for h/o DVT on Eliquis, NICM, PPM, HLD, HTN.   Assessment / Plan / Recommendation Clinical Impression  Patient presents with impaired cognitive-linguistic function compared to baseline as evidenced by score of 10/30 on SLUMS examination and deficits noted during patient/family interview. Daughter reports that while patient has experienced a gradual cognitive decline over the past few years, performance on SLUMS is acutely different compared to cognitive test performed at recent MD appointment PTA. Daughter notes changes to orientation, problem solving, and most notablity memory. Patient exhibits intact immediate registration of recall items, however after 5 minute distracted delay recalled 0/5. Patient would benefit from continued ST services to targeted all aforementioned deficits as well as ongoing ST at next venue of care.    SLP Assessment  SLP Recommendation/Assessment: Patient needs continued Speech Lanaguage Pathology Services SLP Visit  Diagnosis: Cognitive communication deficit (R41.841)    Recommendations for follow up therapy are one component of a multi-disciplinary discharge planning process, led by the attending physician.  Recommendations may be updated based on patient status, additional functional criteria and insurance authorization.    Follow Up Recommendations  Acute inpatient rehab (3hours/day)    Assistance Recommended at Discharge  Frequent or constant Supervision/Assistance  Functional Status Assessment Patient has had a recent decline in their functional status and demonstrates the ability to make significant improvements in function in a reasonable and predictable amount of time.  Frequency and Duration min 2x/week  2 weeks      SLP Evaluation Cognition  Overall Cognitive Status: Impaired/Different from baseline Arousal/Alertness: Awake/alert Orientation Level: Oriented to person;Disoriented to time;Disoriented to situation Year: Other (Comment) ("the nineties") Month:  (incorrect) Day of Week: Incorrect Attention: Sustained Sustained Attention: Impaired Sustained Attention Impairment: Verbal basic;Functional basic Memory: Impaired Memory Impairment: Storage deficit;Retrieval deficit;Decreased recall of new information Awareness: Impaired Awareness Impairment: Intellectual impairment;Emergent impairment;Anticipatory impairment Problem Solving: Impaired Problem Solving Impairment: Verbal basic;Functional basic Executive Function: Organizing;Self Monitoring;Self Correcting Organizing: Impaired Organizing Impairment: Verbal basic;Functional basic Self Monitoring: Impaired Self Monitoring Impairment: Verbal basic;Functional basic Self Correcting: Impaired Self Correcting  Impairment: Verbal basic;Functional basic Safety/Judgment: Impaired       Comprehension  Auditory Comprehension Overall Auditory Comprehension: Appears within functional limits for tasks assessed Yes/No Questions: Within  Functional Limits Visual Recognition/Discrimination Discrimination: Not tested Reading Comprehension Reading Status: Within funtional limits (daughter reports patient was unable to read during admission but this has improved)    Expression Expression Primary Mode of Expression: Verbal Verbal Expression Overall Verbal Expression: Appears within functional limits for tasks assessed Initiation: No impairment Written Expression Dominant Hand: Right Written Expression: Not tested   Oral / Motor  Oral Motor/Sensory Function Overall Oral Motor/Sensory Function: Within functional limits Motor Speech Overall Motor Speech: Appears within functional limits for tasks assessed Intelligibility: Intelligible           Jeannie Done, M.A., CCC-SLP  Yetta Barre 06/08/2023, 3:34 PM

## 2023-06-08 NOTE — Plan of Care (Signed)
  Problem: Health Behavior/Discharge Planning: Goal: Ability to manage health-related needs will improve Outcome: Progressing   Problem: Clinical Measurements: Goal: Will remain free from infection Outcome: Progressing Goal: Diagnostic test results will improve Outcome: Progressing   Problem: Nutrition: Goal: Adequate nutrition will be maintained Outcome: Progressing   

## 2023-06-08 NOTE — Progress Notes (Signed)
 Progress Note   Patient: Madison White GEX:[id number] DOB: Feb 09, 1929 DOA: 06/07/2023     1 DOS: the patient was seen and examined on 06/08/2023   Brief hospital course:  KIMMY TOTTEN is a 89 y.o. female with medical history significant history of DVT on Eliquis, nonischemic cardiomyopathy, pacemaker, hyperlipidemia and hypertension.  The patient had a witnessed fall getting out of bed this morning which is unusual for her.  She seemed just fine after the fall and was able to walk to the bathroom with help and had some breakfast and sit in the chair.  The patient lives with her daughter who check on her frequently through the morning.  She seemed to be at her baseline other than the fact that she slept a lot this morning.  At about 1030 The patient got herself up to go to the bathroom she was walking with her walker and the daughter came in and saw at that moment the patient seemed to have a facial droop.  She also was not holding her walker properly instead of having her hand around the walker she just had her fist sitting on top of the walker.  The patient was also confused and talking about a chair that was not there.  All of these things created panic and the patient's daughter who took her mother to the car immediately and brought her to the ER.  In the emergency department a code stroke was called.  The there was concern that the patient's last known well was last night so she did not meet criteria for intervention.  Per the neurology report her scan showed a moderate-sized right CVA.  The patient will be admitted to the hospitalist service.   Assessment and Plan:  Acute/subacute CVA Etiology appears to be cardioembolic and imaging shows an acute/subacute infarct in the right parietal lobe with loss of gray-white differentiation at both the ganglionic and super ganglionic levels of the right parietal lobe. CT angiogram of the head and neck does not show any LVO 2D echocardiogram shows an  LVEF of 60 to 65% with moderate concentric LVH. MRI ordered, pending.  Patient has a pacemaker.  Unsure if compatible with MRI Appreciate neurology input and recommend to hold Eliquis due to increased risk for hemorrhagic conversion Continue aspirin 81 mg Appreciate PT input and they recommend inpatient rehab consult Appreciate speech and OT evaluation Allow for permissive hypertension   History of DVT Hold Eliquis for now   Dementia  Continue Aricept     Subjective: Patient is seen and examined at the bedside.  Being fed by her daughter  Physical Exam: Vitals:   06/07/23 1917 06/07/23 2008 06/07/23 2355 06/08/23 0432  BP: (!) 147/66 (!) 140/61 132/63 (!) 152/89  Pulse: 74 70 72 70  Resp: 16 18    Temp: 100.2 F (37.9 C) 98.7 F (37.1 C) 98.4 F (36.9 C) 98.1 F (36.7 C)  TempSrc: Oral Oral Axillary Axillary  SpO2: 100% 99% 96% 100%   General: No acute distress, well developed, well nourished HEENT: Normocephalic, atraumatic, PERRL Cardiovascular: Normal rate and rhythm. Distal pulses intact. Pulmonary: Normal pulmonary effort, normal breath sounds Gastrointestinal: Nondistended abdomen, soft, non-tender, normoactive bowel sounds, no organomegaly Musculoskeletal:Normal ROM, no lower ext edema Lymphadenopathy: No cervical LAD. Skin: Skin is warm and dry. Neuro:  AAOx1, left arm weakness and uncoordination. when she squeezes my hand she has a hard time letting go.  She has past pointing trying to find her nose. Facial droop  seems to be resolved. PSYCH: Follows simple commands after being reminded a few times.    Data Reviewed:  Labs reviewed  Family Communication: Plan of care discussed with patient and her daughter at the bedside.  All questions and concerns have been addressed.  She verbalizes understanding and agrees with the plan.  Disposition: Status is: Inpatient Remains inpatient appropriate because: Stroke workup  Planned Discharge Destination:   TBD    Time spent: 36 minutes  Author: Lucile Shutters, MD 06/08/2023 4:22 PM  For on call review [email or web address].

## 2023-06-08 NOTE — Progress Notes (Signed)
 Due to this pt having a pacemaker the pt will need a nurse to monitor them during MRI the nurse is aware pt has an appointment @1100  on 3/25.

## 2023-06-08 NOTE — Plan of Care (Signed)
  Problem: Education: Goal: Knowledge of General Education information will improve Description: Including pain rating scale, medication(s)/side effects and non-pharmacologic comfort measures Outcome: Progressing   Problem: Health Behavior/Discharge Planning: Goal: Ability to manage health-related needs will improve Outcome: Progressing   Problem: Clinical Measurements: Goal: Ability to maintain clinical measurements within normal limits will improve Outcome: Progressing Goal: Will remain free from infection Outcome: Progressing Goal: Diagnostic test results will improve Outcome: Progressing Goal: Respiratory complications will improve Outcome: Progressing Goal: Cardiovascular complication will be avoided Outcome: Progressing   Problem: Activity: Goal: Risk for activity intolerance will decrease Outcome: Progressing   Problem: Nutrition: Goal: Adequate nutrition will be maintained Outcome: Progressing   Problem: Coping: Goal: Level of anxiety will decrease Outcome: Progressing   Problem: Pain Managment: Goal: General experience of comfort will improve and/or be controlled Outcome: Progressing   Problem: Elimination: Goal: Will not experience complications related to bowel motility Outcome: Progressing Goal: Will not experience complications related to urinary retention Outcome: Progressing   Problem: Safety: Goal: Ability to remain free from injury will improve Outcome: Progressing   Problem: Skin Integrity: Goal: Risk for impaired skin integrity will decrease Outcome: Progressing   Problem: Education: Goal: Knowledge of disease or condition will improve Outcome: Progressing Goal: Knowledge of secondary prevention will improve (MUST DOCUMENT ALL) Outcome: Progressing Goal: Knowledge of patient specific risk factors will improve (DELETE if not current risk factor) Outcome: Progressing   Problem: Ischemic Stroke/TIA Tissue Perfusion: Goal: Complications of  ischemic stroke/TIA will be minimized Outcome: Progressing   Problem: Coping: Goal: Will verbalize positive feelings about self Outcome: Progressing Goal: Will identify appropriate support needs Outcome: Progressing   Problem: Health Behavior/Discharge Planning: Goal: Ability to manage health-related needs will improve Outcome: Progressing Goal: Goals will be collaboratively established with patient/family Outcome: Progressing   Problem: Self-Care: Goal: Ability to participate in self-care as condition permits will improve Outcome: Progressing Goal: Verbalization of feelings and concerns over difficulty with self-care will improve Outcome: Progressing Goal: Ability to communicate needs accurately will improve Outcome: Progressing   Problem: Nutrition: Goal: Risk of aspiration will decrease Outcome: Progressing Goal: Dietary intake will improve Outcome: Progressing

## 2023-06-08 NOTE — Evaluation (Signed)
 Physical Therapy Evaluation Patient Details Name: Madison White MRN: [id number] DOB: 1928/11/12 Today's Date: 06/08/2023  History of Present Illness  Patient is a 89 y/o female admitted 06/07/23 with fall at home and noted L side weakness and facial droop.  CTH showed moderate size R CVA.  PMH positive for h/o DVT on Eliquis, NICM, PPM, HLD, HTN.  Clinical Impression  Patient presents with decreased mobility due to decreased cognition, decreased balance, decreased safety awareness with L side weakness and inattention.  She was previously able to ambulate with RW in the home unaided.  She completed toileting tasks and made her bed independently as well.  Currently mod A for toileting and min A for ambulation with RW.  She will benefit from skilled PT in the acute setting and family is interested in inpatient rehab (>3 hours/day) prior to d/c home.         If plan is discharge home, recommend the following: A little help with walking and/or transfers;A little help with bathing/dressing/bathroom;Assistance with cooking/housework;Supervision due to cognitive status;Assist for transportation;Help with stairs or ramp for entrance   Can travel by private vehicle        Equipment Recommendations None recommended by PT  Recommendations for Other Services  Rehab consult    Functional Status Assessment Patient has had a recent decline in their functional status and demonstrates the ability to make significant improvements in function in a reasonable and predictable amount of time.     Precautions / Restrictions Precautions Precautions: Fall Precaution/Restrictions Comments: L inattention      Mobility  Bed Mobility Overal bed mobility: Needs Assistance Bed Mobility: Supine to Sit     Supine to sit: Min assist, HOB elevated     General bed mobility comments: sat up on her own, assist for scooting hips    Transfers Overall transfer level: Needs assistance Equipment used: Rolling  walker (2 wheels), 1 person hand held assist Transfers: Sit to/from Stand Sit to Stand: Min assist           General transfer comment: assist for rising from EOB and from toilet in bathroom cues for hand placement on rail in bathroom    Ambulation/Gait Ambulation/Gait assistance: Min assist Gait Distance (Feet): 150 Feet (& 10' in room with HHA for balance to bathroom and to sink to wash hands) Assistive device: Rolling walker (2 wheels), 1 person hand held assist Gait Pattern/deviations: Step-through pattern, Decreased stride length, Trunk flexed, Drifts right/left       General Gait Details: assist for maneuvering obstacles on L with cues versus assist for walker management  Stairs            Wheelchair Mobility     Tilt Bed    Modified Rankin (Stroke Patients Only) Modified Rankin (Stroke Patients Only) Pre-Morbid Rankin Score: Moderate disability Modified Rankin: Moderately severe disability     Balance Overall balance assessment: Needs assistance   Sitting balance-Leahy Scale: Good       Standing balance-Leahy Scale: Poor Standing balance comment: washing hands at sink CGA                             Pertinent Vitals/Pain Pain Assessment Pain Assessment: No/denies pain    Home Living Family/patient expects to be discharged to:: Private residence Living Arrangements: Children Available Help at Discharge: Family Type of Home: House Home Access: Stairs to enter   Secretary/administrator of Steps: 1   Home Layout:  Two level;Able to live on main level with bedroom/bathroom Home Equipment: Rolling Walker (2 wheels);Shower seat;Hand held shower head;Grab bars - tub/shower Additional Comments: stool with rails at the bed for bed entry    Prior Function Prior Level of Function : Needs assist               ADLs Comments: help to shower, help for LB dressing; help for all IADL's     Extremity/Trunk Assessment   Upper Extremity  Assessment Upper Extremity Assessment: Right hand dominant;LUE deficits/detail LUE Deficits / Details: decreased FM coordination, decreased awareness with A to get hand on rail and on RW; reaching to help pull up pants though not close enough to get edge of clothing LUE Coordination: decreased fine motor    Lower Extremity Assessment Lower Extremity Assessment: LLE deficits/detail LLE Deficits / Details: mildly decreased compared to R esp hip flexion 3+/5 and ankle DF WFL though only with cueing    Cervical / Trunk Assessment Cervical / Trunk Assessment: Kyphotic  Communication   Communication Communication: No apparent difficulties    Cognition Arousal: Alert Behavior During Therapy: WFL for tasks assessed/performed   PT - Cognitive impairments: Orientation, Attention, Memory   Orientation impairments: Place, Time, Situation                   PT - Cognition Comments: stated in hospital, no idea which one, stated [place], did not try to state year and no awareness of situation Following commands: Intact       Cueing Cueing Techniques: Verbal cues     General Comments General comments (skin integrity, edema, etc.): son and daughter present and supportive    Exercises     Assessment/Plan    PT Assessment Patient needs continued PT services  PT Problem List Decreased strength;Decreased mobility;Decreased balance;Decreased knowledge of use of DME;Decreased activity tolerance;Decreased safety awareness;Decreased coordination       PT Treatment Interventions DME instruction;Therapeutic exercise;Gait training;Balance training;Functional mobility training;Therapeutic activities;Patient/family education;Cognitive remediation    PT Goals (Current goals can be found in the Care Plan section)  Acute Rehab PT Goals Patient Stated Goal: for rehab PT Goal Formulation: With patient/family Time For Goal Achievement: 06/22/23 Potential to Achieve Goals: Good     Frequency Min 3X/week     Co-evaluation               AM-PAC PT "6 Clicks" Mobility  Outcome Measure Help needed turning from your back to your side while in a flat bed without using bedrails?: A Little Help needed moving from lying on your back to sitting on the side of a flat bed without using bedrails?: A Little Help needed moving to and from a bed to a chair (including a wheelchair)?: A Little Help needed standing up from a chair using your arms (e.g., wheelchair or bedside chair)?: A Little Help needed to walk in hospital room?: A Little Help needed climbing 3-5 steps with a railing? : Total 6 Click Score: 16    End of Session   Activity Tolerance: Patient tolerated treatment well Patient left: in chair;with call bell/phone within reach;with family/visitor present   PT Visit Diagnosis: Other abnormalities of gait and mobility (R26.89);Other symptoms and signs involving the nervous system (R29.898);Hemiplegia and hemiparesis Hemiplegia - Right/Left: Left Hemiplegia - dominant/non-dominant: Non-dominant Hemiplegia - caused by: Cerebral infarction    Time: 8295-6213 PT Time Calculation (min) (ACUTE ONLY): 27 min   Charges:   PT Evaluation $PT Eval Moderate Complexity: 1 Mod PT  Treatments $Gait Training: 8-22 mins PT General Charges $$ ACUTE PT VISIT: 1 Visit         Sheran Lawless, PT Acute Rehabilitation Services [phone] 06/08/2023   [name] 06/08/2023, 11:58 AM

## 2023-06-08 NOTE — Progress Notes (Addendum)
 STROKE TEAM PROGRESS NOTE    INTERIM HISTORY/SUBJECTIVE  Her daughter and son are at the bedside.  Patient is sitting in the chair no apparent distress.  Per daughter she has been having some  memory decline over the last couple of months Neurological exam shows only mild left face, hand and leg weakness. CT head with acute/subacute right parietal lobe infarct  OBJECTIVE  CBC    Component Value Date/Time   WBC 14.4 (H) 06/07/2023 1202   RBC 4.36 06/07/2023 1202   HGB 12.8 06/07/2023 1202   HCT 38.4 06/07/2023 1202   PLT 331 06/07/2023 1202   MCV 88.1 06/07/2023 1202   MCH 29.4 06/07/2023 1202   MCHC 33.3 06/07/2023 1202   RDW 14.4 06/07/2023 1202   LYMPHSABS 1.1 06/07/2023 1202   MONOABS 0.6 06/07/2023 1202   EOSABS 0.0 06/07/2023 1202   BASOSABS 0.0 06/07/2023 1202    BMET    Component Value Date/Time   NA 141 06/07/2023 1202   NA 139 10/31/2019 0000   K 3.7 06/07/2023 1202   CL 105 06/07/2023 1202   CO2 24 06/07/2023 1202   GLUCOSE 153 (H) 06/07/2023 1202   BUN 12 06/07/2023 1202   BUN 12 10/31/2019 0000   CREATININE 0.74 06/07/2023 1202   CREATININE 0.99 (H) 10/25/2019 0938   CALCIUM 9.2 06/07/2023 1202   GFRNONAA >60 06/07/2023 1202   GFRNONAA 83.37 09/30/2016 0000    IMAGING past 24 hours DG Chest Portable 1 View Result Date: 06/07/2023 CLINICAL DATA:  89 year old female with fall, left side weakness. EXAM: PORTABLE CHEST 1 VIEW COMPARISON:  Chest radiographs 03/11/2021 and earlier. FINDINGS: Portable AP view at 1229 hours. Chronic left chest cardiac pacemaker. Mediastinal contours are stable and within normal limits. Lung volumes are stable and within normal limits. Allowing for portable technique the lungs are clear. Visualized tracheal air column is within normal limits. Chronic ORIF proximal right humerus. No acute osseous abnormality identified. Paucity of bowel gas in the visible abdomen. IMPRESSION: No acute cardiopulmonary abnormality or acute traumatic  injury identified. Electronically Signed   By: Odessa Fleming M.D.   On: 06/07/2023 12:57   CT ANGIO HEAD NECK W WO CM W PERF (CODE STROKE) Result Date: 06/07/2023 CLINICAL DATA:  Code stroke.  Left-sided weakness. EXAM: CT ANGIOGRAPHY HEAD AND NECK TECHNIQUE: Multidetector CT imaging of the head and neck was performed using the standard protocol during bolus administration of intravenous contrast. Multiplanar CT image reconstructions and MIPs were obtained to evaluate the vascular anatomy. Carotid stenosis measurements (when applicable) are obtained utilizing NASCET criteria, using the distal internal carotid diameter as the denominator. RADIATION DOSE REDUCTION: This exam was performed according to the departmental dose-optimization program which includes automated exposure control, adjustment of the mA and/or kV according to patient size and/or use of iterative reconstruction technique. CONTRAST:  OMNIPAQUE IOHEXOL 350 MG/ML SOLN COMPARISON:  CT head without contrast 06/07/2023. CT head without contrast 11/21/2017. FINDINGS: CTA NECK FINDINGS Aortic arch: Atherosclerotic calcifications are present at the aortic arch and great vessel origins. No significant stenosis is present at the origins the great vessels. No aneurysm or dissection is present. Right carotid system: Right common carotid artery is within normal limits. Calcifications are present at the right carotid bifurcation without significant stenosis. The cervical right ICA is otherwise normal. Left carotid system: The left common carotid artery is within normal limits. Atherosclerotic calcifications are present at the bifurcation. Mild tortuosity is present in the mid cervical left ICA. No focal stenosis  is present. Vertebral arteries: The right vertebral artery is dominant. Both vertebral arteries originate from the subclavian arteries without significant stenoses. No significant stenosis is present in either vertebral artery in the neck. Skeleton:  Mild degenerative changes are present cervical spine, most evident at C3-4 and C5-6. No focal osseous lesions are present. Other neck: The soft tissues of the neck are otherwise unremarkable. Salivary glands are within normal limits. Thyroid is normal. No significant adenopathy is present. No focal mucosal or submucosal lesions are present. Upper chest: The lung apices are clear. The thoracic inlet is within normal limits. Review of the MIP images confirms the above findings CTA HEAD FINDINGS Anterior circulation: Atherosclerotic calcifications are present within the cavernous internal carotid arteries bilaterally without significant stenosis through the ICA termini. The A1 and M1 segments are normal. The anterior communicating artery is patent. The MCA bifurcations are within normal limits bilaterally. Moderate stenosis is present in the proximal inferior right M2 segment. Asymmetric attenuation of posterior right MCA branches is noted. The more distal anterior superior right M3 stenosis is present. No branch vessel occlusion is present. The left MCA branch vessels and bilateral ACA branch vessels are normal. Posterior circulation: PICA origins are visualized and normal bilaterally. The vertebrobasilar junction is normal. Moderate proximal basilar artery stenosis is present. Basilar artery is small, centrally terminating at the superior cerebellar arteries. Fetal type posterior cerebral arteries are present bilaterally. Moderate stenoses are present in the left P1 and left P2 segment. More mild right P2 segment narrowing is present. No vessel occlusion is present. No aneurysm is present. Venous sinuses: The dural sinuses are patent. The straight sinus and deep cerebral veins are intact. Cortical veins are within normal limits. No significant vascular malformation is evident. Anatomic variants: Fetal type posterior cerebral arteries bilaterally. Review of the MIP images confirms the above findings IMPRESSION: 1. No  large vessel occlusion. 2. Moderate stenosis of the proximal inferior right M2 segment. 3. More distal anterior superior right M3 stenosis. 4. Moderate proximal basilar artery stenosis. 5. Moderate stenoses in the left P1 and left P2 segment. 6. More mild right P2 segment narrowing. 7. Atherosclerotic changes at the carotid bifurcations and cavernous internal carotid arteries bilaterally without significant stenosis. 8. Mild degenerative changes of the cervical spine, most evident at C3-4 and C5-6. 9.  Aortic Atherosclerosis (ICD10-I70.0). The above was relayed via text pager to Dr. Ritta Slot on 06/07/2023 at 12:17 . Electronically Signed   By: Marin Roberts M.D.   On: 06/07/2023 12:26   CT HEAD CODE STROKE WO CONTRAST Result Date: 06/07/2023 CLINICAL DATA:  Code stroke. Neuro deficit, acute, stroke suspected. Last known normal less than 1 hour ago. Left-sided weakness. EXAM: CT HEAD WITHOUT CONTRAST TECHNIQUE: Contiguous axial images were obtained from the base of the skull through the vertex without intravenous contrast. RADIATION DOSE REDUCTION: This exam was performed according to the departmental dose-optimization program which includes automated exposure control, adjustment of the mA and/or kV according to patient size and/or use of iterative reconstruction technique. COMPARISON:  MR head without contrast 12/02/2014 FINDINGS: Brain: Acute/subacute infarct is present in the right parietal lobe. Loss of gray-white differentiation present at both the ganglionic and super ganglionic levels of the right parietal lobe. Insular ribbon is intact. Basal ganglia are within normal limits. Moderate generalized atrophy and white matter disease is present otherwise. The ventricles are proportionate to the degree of atrophy. No significant extraaxial fluid collection is present. Vascular: Atherosclerotic calcifications are present at the cavernous internal carotid  arteries bilaterally. No hyperdense vessel  is present. Skull: Calvarium is intact. No focal lytic or blastic lesions are present. No significant extracranial soft tissue lesion is present. Sinuses/Orbits: The paranasal sinuses and mastoid air cells are clear. Bilateral lens replacements are noted. Globes and orbits are otherwise unremarkable. ASPECTS [facility] Stroke Program Early CT Score) - Ganglionic level infarction (caudate, lentiform nuclei, internal capsule, insula, M1-M3 cortex): 6/7 - Supraganglionic infarction (M4-M6 cortex): 2/3 Total score (0-10 with 10 being normal): 8/10 IMPRESSION: 1. Acute/subacute infarct in the right parietal lobe with loss of gray-white differentiation at both the ganglionic and super ganglionic levels of the right parietal lobe. 2. Aspects is 8/10. 3. Moderate generalized atrophy and white matter disease likely reflects the sequela of chronic microvascular ischemia. The above was relayed via text pager to Dr. Amada Jupiter on 06/07/2023 at 12:07 . Electronically Signed   By: Marin Roberts M.D.   On: 06/07/2023 12:11    Vitals:   06/07/23 1917 06/07/23 2008 06/07/23 2355 06/08/23 0432  BP: (!) 147/66 (!) 140/61 132/63 (!) 152/89  Pulse: 74 70 72 70  Resp: 16 18    Temp: 100.2 F (37.9 C) 98.7 F (37.1 C) 98.4 F (36.9 C) 98.1 F (36.7 C)  TempSrc: Oral Oral Axillary Axillary  SpO2: 100% 99% 96% 100%     PHYSICAL EXAM General:  Alert, well-nourished, well-developed patient in no acute distress Psych:  Mood and affect appropriate for situation CV: Regular rate and rhythm on monitor Respiratory:  Regular, unlabored respirations on room air GI: Abdomen soft and nontender   NEURO:  Mental Status: AA&O self unable to state current month or age Speech/Language: speech is without dysarthria or aphasia.  Naming, repetition, fluency, and comprehension intact.  Cranial Nerves:  II: PERRL. Visual fields questionable left field cut III, IV, VI: EOMI. Eyelids elevate symmetrically.  V: Sensation is  intact to light touch and symmetrical to face.  VII: Slight left facial droop VIII: hearing intact to voice. IX, X: Palate elevates symmetrically. Phonation is normal.  ZO:[id number] shrug 5/5. XII: tongue is midline without fasciculations. Motor: 5/5 strength in right upper.  4/5 in left upper, bilateral lowers equal 4/5 Tone: is normal and bulk is normal Sensation- Intact to light touch bilaterally. Extinction absent to light touch to DSS.   Coordination: FTN intact bilaterally, HKS: no ataxia in BLE.No drift.  Has right over the left orbiting Gait- deferred  Most Recent NIH 4   ASSESSMENT/PLAN  [name] is a 89 y.o. female with history of DVT on Eliquis, nonischemic cardiomyopathy, pacemaker, hyperlipidemia and hypertension, GERD, headaches, diastolic dysfunction, history of rheumatic fever.   NIH on Admission 7  Acute Ischemic Infarct:  right parietal lobe  Etiology: Cardioembolic Code Stroke CT head Acute/subacute infarct in the right parietal  ASPECTS 8   CTA head & neck no LVO MRI ordered has pacemaker unsure if compatible with MRI 2D Echo EF 60 to 65%.  Moderate concentric LVH.  Left atrium severely dilated, right atrium mildly dilated LDL 88 HgbA1c 6.0 VTE prophylaxis -SCDs Eliquis (apixaban) daily prior to admission, now on aspirin 81 mg daily restart Eliquis in 2 to 3 days Therapy recommendations:  Pending Disposition: Pending  History of DVT Eliquis Eliquis on hold due to acute stroke  Hypertension Home meds: Metoprolol 25 mg Stable Blood Pressure Goal: SBP less than 160 or BP less than 180/105   Hyperlipidemia Home meds: None,  LDL 88, goal < 70 Continue statin at discharge  Dysphagia  Patient has post-stroke dysphagia, SLP consulted    Diet   Diet regular Room service appropriate? Yes; Fluid consistency: Thin   Advance diet as tolerated  Other Stroke Risk Factors    Other Active Problems Cognitive issues  Hospital day # 1  Gevena Mart DNP, ACNPC-AG  [facility]  I have personally obtained history,examined this patient, reviewed notes, independently viewed imaging studies, participated in medical decision making and plan of care.ROS completed by me personally and pertinent positives fully documented  I have made any additions or clarifications directly to the above note. Agree with note above.  Patient with A-fib on Eliquis with good compliance presents with embolic right parietal infarct.  Continue ongoing stroke workup.  Physical occupational and speech therapy consults.  Mobilize out of bed.  Long discussion with the patient and son and daughter about risk-benefit of switching Eliquis to Xarelto or Pradaxa with absence of definitive data suggesting that this is superior.  He will ask pharmacist to check into patient's co-pay for Pradaxa and if it does not significantly go up may consider switching otherwise continuing Eliquis.  Patient is not a candidate for Watchman device due to her advanced age and comorbidities.  Long discussion with patient and son and daughter and answered questions.  Greater than 50% time during this 50-minute visit was spent in counseling and coordination of care about embolic stroke and discussion about risk-benefit of continued Eliquis versus switching to alternative agents and answering questions.  Delia Heady, MD Medical Director [facility] Stroke Center Pager: [phone] 06/08/2023 4:27 PM  To contact Stroke Continuity provider, please refer to [email or web address]. After hours, contact General Neurology

## 2023-06-08 NOTE — Progress Notes (Signed)

## 2023-06-08 NOTE — TOC Initial Note (Signed)
 Transition of Care [facility]) - Initial/Assessment Note    Patient Details  Name: Madison White MRN: [id number] Date of Birth: 03-29-1928  [facility] of [facility] CM/SW Contact:    Kermit Balo, RN Phone Number: 06/08/2023, 1:45 PM  Clinical Narrative:                  Pt is from home with her daughter. Daughter provides assistance with most ADL's.  Daughter manages her medications and provides needed transportation. Current recommendations are for CIR. Awaiting work up. TOC following.  Expected Discharge Plan: IP Rehab Facility Barriers to Discharge: Continued Medical Work up   Patient Goals and CMS Choice   CMS Medicare.gov Compare Post Acute Care list provided to:: Patient Represenative (must comment) Choice offered to / list presented to : Adult Children      Expected Discharge Plan and Services   Discharge Planning Services: CM Consult Post Acute Care Choice: IP Rehab Living arrangements for the past 2 months: Single Family Home                                      Prior Living Arrangements/Services Living arrangements for the past 2 months: Single Family Home Lives with:: Adult Children Patient language and need for interpreter reviewed:: Yes Do you feel safe going back to the place where you live?: Yes        Care giver support system in place?: Yes (comment) Current home services: DME (walker/ rollator/ shower seat/ bars) Criminal Activity/Legal Involvement Pertinent to Current Situation/Hospitalization: No - Comment as needed  Activities of Daily Living   ADL Screening (condition at time of admission) Independently performs ADLs?: No Does the patient have a NEW difficulty with bathing/dressing/toileting/self-feeding that is expected to last >3 days?: No Does the patient have a NEW difficulty with getting in/out of bed, walking, or climbing stairs that is expected to last >3 days?: No Does the patient have a NEW difficulty with communication that  is expected to last >3 days?: No Is the patient deaf or have difficulty hearing?: Yes Does the patient have difficulty seeing, even when wearing glasses/contacts?: Yes Does the patient have difficulty concentrating, remembering, or making decisions?: Yes  Permission Sought/Granted                  Emotional Assessment Appearance:: Appears stated age     Orientation: : Oriented to Self, Oriented to Place, Oriented to Situation   Psych Involvement: No (comment)  Admission diagnosis:  Cerebrovascular accident (CVA), unspecified mechanism (HCC) [I63.9] AMS (altered mental status) [R41.82] Patient Active Problem List   Diagnosis Date Noted   AMS (altered mental status) 06/07/2023   Stroke (HCC) 06/07/2023   Seborrheic keratoses 12/03/2022   Telangiectasia 12/03/2022   Long-term current use of proton pump inhibitor therapy 04/09/2022   Current use of long term anticoagulation 04/09/2022   Complete heart block (HCC) 12/18/2021   Vascular dementia (HCC) 06/18/2021   Overactive bladder 03/19/2021   History of DVT of lower extremity left 12/27/2019   Persistent atrial fibrillation (HCC) 08/01/2019   Presence of biventricular cardiac pacemaker 04/05/2019   Recurrent nephrolithiasis 06/17/2017   History of complete heart block 05/05/2017   Renal cyst 03/01/2017   Hydronephrosis with urinary obstruction due to renal calculus 03/01/2017   Total knee replacement status, bilateral 11/06/2016   Nonischemic cardiomyopathy (HCC) 11/17/2014   Upper airway cough syndrome 02/26/2014  Constipation, chronic 02/26/2014   GERD (gastroesophageal reflux disease) 11/19/2012   Dyslipidemia 09/02/2012   Left bundle branch block 12/18/2011   Essential hypertension 05/31/2011   PCP:  Willow Ora, MD Pharmacy:   [facility] [phone] [address] [address], [place] - [address] [place] [address] Phone: [phone] Fax: [phone]     Social Drivers of Health  (SDOH) Social History: SDOH Screenings   Food Insecurity: No Food Insecurity (06/07/2023)  Housing: Low Risk  (06/07/2023)  Transportation Needs: No Transportation Needs (06/07/2023)  Utilities: Not At Risk (06/07/2023)  Depression (PHQ2-9): Low Risk  (02/09/2023)  Financial Resource Strain: Low Risk  (02/09/2023)  Physical Activity: Inactive (02/09/2023)  Social Connections: Socially Isolated (06/07/2023)  Stress: No Stress Concern Present (02/09/2023)  Tobacco Use: Medium Risk (06/07/2023)  Health Literacy: Adequate Health Literacy (02/09/2023)   SDOH Interventions:     Readmission Risk Interventions     No data to display

## 2023-06-09 ENCOUNTER — Other Ambulatory Visit (HOSPITAL_COMMUNITY): Payer: Self-pay

## 2023-06-09 ENCOUNTER — Inpatient Hospital Stay (HOSPITAL_COMMUNITY)

## 2023-06-09 ENCOUNTER — Telehealth (HOSPITAL_COMMUNITY): Payer: Self-pay | Admitting: Pharmacy Technician

## 2023-06-09 DIAGNOSIS — I63411 Cerebral infarction due to embolism of right middle cerebral artery: Secondary | ICD-10-CM

## 2023-06-09 MED ORDER — DABIGATRAN ETEXILATE MESYLATE 150 MG PO CAPS
150.0000 mg | ORAL_CAPSULE | Freq: Two times a day (BID) | ORAL | Status: DC
  Administered 2023-06-09 – 2023-06-11 (×4): 150 mg via ORAL
  Filled 2023-06-09 (×5): qty 1

## 2023-06-09 MED ORDER — METOPROLOL TARTRATE 12.5 MG HALF TABLET
12.5000 mg | ORAL_TABLET | Freq: Two times a day (BID) | ORAL | Status: DC
Start: 2023-06-09 — End: 2023-06-11
  Administered 2023-06-09 – 2023-06-11 (×4): 12.5 mg via ORAL
  Filled 2023-06-09 (×4): qty 1

## 2023-06-09 NOTE — Telephone Encounter (Signed)
 Patient Product/process development scientist completed.    The patient is insured through [employer]. Patient has Medicare and is not eligible for a copay card, but may be able to apply for patient assistance or Medicare RX Payment Plan (Patient Must reach out to their plan, if eligible for payment plan), if available.    Ran test claim for Eliquis 5 mg and the current 30 day co-pay is $152.71.  Ran test claim for dabigatran (Pradaxa) 150 mg and the current 30 day co-pay is $53.03.  This test claim was processed through [facility]- copay amounts may vary at other pharmacies due to pharmacy/plan contracts, or as the patient moves through the different stages of their insurance plan.     Roland Earl, CPHT Pharmacy Technician III Certified Patient Advocate [facility] Patient Advocate Team Direct Number: [phone]  Fax: [phone]

## 2023-06-09 NOTE — Progress Notes (Addendum)
 STROKE TEAM PROGRESS NOTE    INTERIM HISTORY/SUBJECTIVE  Her daughter and son are at the bedside.  Patient is sitting in the chair no apparent distress.  Per daughter she has been having some  memory decline over the last couple of months Neurological exam shows only mild left face, hand and leg weakness. MRI scan of the brain confirms patchy acute/subacute right parietal lobe infarct  OBJECTIVE  CBC    Component Value Date/Time   WBC 14.4 (H) 06/07/2023 1202   RBC 4.36 06/07/2023 1202   HGB 12.8 06/07/2023 1202   HCT 38.4 06/07/2023 1202   PLT 331 06/07/2023 1202   MCV 88.1 06/07/2023 1202   MCH 29.4 06/07/2023 1202   MCHC 33.3 06/07/2023 1202   RDW 14.4 06/07/2023 1202   LYMPHSABS 1.1 06/07/2023 1202   MONOABS 0.6 06/07/2023 1202   EOSABS 0.0 06/07/2023 1202   BASOSABS 0.0 06/07/2023 1202    BMET    Component Value Date/Time   NA 141 06/07/2023 1202   NA 139 10/31/2019 0000   K 3.7 06/07/2023 1202   CL 105 06/07/2023 1202   CO2 24 06/07/2023 1202   GLUCOSE 153 (H) 06/07/2023 1202   BUN 12 06/07/2023 1202   BUN 12 10/31/2019 0000   CREATININE 0.74 06/07/2023 1202   CREATININE 0.99 (H) 10/25/2019 0938   CALCIUM 9.2 06/07/2023 1202   GFRNONAA >60 06/07/2023 1202   GFRNONAA 83.37 09/30/2016 0000    IMAGING past 24 hours ECHOCARDIOGRAM COMPLETE Result Date: 06/08/2023    ECHOCARDIOGRAM REPORT   Patient Name:   Madison White Date of Exam: 06/08/2023 Medical Rec #:  [id number]       Height:       61.0 in Accession #:    [id number]      Weight:       150.0 lb Date of Birth:  1928/06/11       BSA:          1.671 m Patient Age:    89 years        BP:           152/89 mmHg Patient Gender: F               HR:           70 bpm. Exam Location:  Inpatient Procedure: 2D Echo, Cardiac Doppler and Color Doppler (Both Spectral and Color            Flow Doppler were utilized during procedure). Indications:    Stroke I63.9  History:        Patient has prior history of Echocardiogram  examinations, most                 recent 12/05/2021. Non-ishcemic CMO; Risk Factors:Hypertension                 and Dyslipidemia.  Sonographer:    Harriette Bouillon RDCS Referring Phys: [id number] CLAUDIA CLAIBORNE IMPRESSIONS  1. Left ventricular ejection fraction, by estimation, is 60 to 65%. The left ventricle has normal function. The left ventricle has no regional wall motion abnormalities. There is moderate concentric left ventricular hypertrophy. Left ventricular diastolic parameters are indeterminate.  2. Right ventricular systolic function is normal. The right ventricular size is normal. Tricuspid regurgitation signal is inadequate for assessing PA pressure.  3. Left atrial size was severely dilated.  4. Right atrial size was mildly dilated.  5. The mitral valve is degenerative. Mild mitral valve regurgitation. No evidence  of mitral stenosis. Moderate mitral annular calcification.  6. The aortic valve was not well visualized. There is mild calcification of the aortic valve. Aortic valve regurgitation is not visualized. Mild aortic valve stenosis. Aortic valve area, by VTI measures 1.38 cm. Aortic valve mean gradient measures 6.0 mmHg. Aortic valve Vmax measures 1.62 m/s.  7. The inferior vena cava is dilated in size with <50% respiratory variability, suggesting right atrial pressure of 15 mmHg. FINDINGS  Left Ventricle: Left ventricular ejection fraction, by estimation, is 60 to 65%. The left ventricle has normal function. The left ventricle has no regional wall motion abnormalities. The left ventricular internal cavity size was normal in size. There is  moderate concentric left ventricular hypertrophy. Left ventricular diastolic parameters are indeterminate. Right Ventricle: The right ventricular size is normal. No increase in right ventricular wall thickness. Right ventricular systolic function is normal. Tricuspid regurgitation signal is inadequate for assessing PA pressure. Left Atrium: Left atrial size was  severely dilated. Right Atrium: Right atrial size was mildly dilated. Pericardium: There is no evidence of pericardial effusion. Mitral Valve: The mitral valve is degenerative in appearance. Moderate mitral annular calcification. Mild mitral valve regurgitation. No evidence of mitral valve stenosis. Tricuspid Valve: The tricuspid valve is normal in structure. Tricuspid valve regurgitation is trivial. No evidence of tricuspid stenosis. Aortic Valve: The aortic valve was not well visualized. There is mild calcification of the aortic valve. Aortic valve regurgitation is not visualized. Mild aortic stenosis is present. Aortic valve mean gradient measures 6.0 mmHg. Aortic valve peak gradient measures 10.5 mmHg. Aortic valve area, by VTI measures 1.38 cm. Pulmonic Valve: The pulmonic valve was not well visualized. Pulmonic valve regurgitation is not visualized. No evidence of pulmonic stenosis. Aorta: The aortic root is normal in size and structure. Venous: The inferior vena cava is dilated in size with less than 50% respiratory variability, suggesting right atrial pressure of 15 mmHg. IAS/Shunts: No atrial level shunt detected by color flow Doppler. Additional Comments: A device lead is visualized.  LEFT VENTRICLE PLAX 2D LVIDd:         4.10 cm   Diastology LVIDs:         3.20 cm   LV e' medial:    8.38 cm/s LV PW:         1.30 cm   LV E/e' medial:  12.2 LV IVS:        1.30 cm   LV e' lateral:   6.42 cm/s LVOT diam:     2.00 cm   LV E/e' lateral: 15.9 LV SV:         43 LV SV Index:   26 LVOT Area:     3.14 cm  RIGHT VENTRICLE             IVC RV S prime:     11.10 cm/s  IVC diam: 2.30 cm TAPSE (M-mode): 1.4 cm LEFT ATRIUM             Index        RIGHT ATRIUM           Index LA diam:        4.30 cm 2.57 cm/m   RA Area:     11.50 cm LA Vol (A2C):   74.4 ml 44.51 ml/m  RA Volume:   24.00 ml  14.36 ml/m LA Vol (A4C):   53.1 ml 31.77 ml/m LA Biplane Vol: 64.9 ml 38.83 ml/m  AORTIC VALVE AV Area (Vmax):    1.56  cm AV  Area (Vmean):   1.62 cm AV Area (VTI):     1.38 cm AV Vmax:           162.00 cm/s AV Vmean:          109.000 cm/s AV VTI:            0.314 m AV Peak Grad:      10.5 mmHg AV Mean Grad:      6.0 mmHg LVOT Vmax:         80.70 cm/s LVOT Vmean:        56.100 cm/s LVOT VTI:          0.138 m LVOT/AV VTI ratio: 0.44  AORTA Ao Root diam: 2.60 cm Ao Asc diam:  3.10 cm MITRAL VALVE MV Area (PHT): 3.65 cm     SHUNTS MV Decel Time: 208 msec     Systemic VTI:  0.14 m MV E velocity: 102.00 cm/s  Systemic Diam: 2.00 cm MV A velocity: 32.30 cm/s MV E/A ratio:  3.16 Arvilla Meres MD Electronically signed by Arvilla Meres MD Signature Date/Time: 06/08/2023/3:16:23 PM    Final     Vitals:   06/08/23 2026 06/08/23 2356 06/09/23 0448 06/09/23 0857  BP: (!) 148/70 (!) 138/57 (!) 161/70 (!) 138/55  Pulse: 71 74 70 69  Resp: 17 17 17 16   Temp: 98.6 F (37 C) 98 F (36.7 C) 98 F (36.7 C) 98.4 F (36.9 C)  TempSrc: Oral   Oral  SpO2: 94% 94% 94% 96%     PHYSICAL EXAM General:  Alert, well-nourished, well-developed patient in no acute distress Psych:  Mood and affect appropriate for situation CV: Regular rate and rhythm on monitor Respiratory:  Regular, unlabored respirations on room air GI: Abdomen soft and nontender   NEURO:  Mental Status: AA&O self unable to state current month or age Speech/Language: speech is without dysarthria or aphasia.  Naming, repetition, fluency, and comprehension intact.  Cranial Nerves:  II: PERRL. Visual fields questionable left field cut III, IV, VI: EOMI. Eyelids elevate symmetrically.  V: Sensation is intact to light touch and symmetrical to face.  VII: Slight left facial droop VIII: hearing intact to voice. IX, X: Palate elevates symmetrically. Phonation is normal.  VQ:QVZDGLOV shrug 5/5. XII: tongue is midline without fasciculations. Motor: 5/5 strength in right upper.  4/5 in left upper, bilateral lowers equal 4/5 Tone: is normal and bulk is  normal Sensation- Intact to light touch bilaterally. Extinction absent to light touch to DSS.   Coordination: FTN intact bilaterally, HKS: no ataxia in BLE.No drift.  Has right over the left orbiting Gait- deferred  Most Recent NIH 4   ASSESSMENT/PLAN  Madison White is a 89 y.o. female with history of DVT on Eliquis, nonischemic cardiomyopathy, pacemaker, hyperlipidemia and hypertension, GERD, headaches, diastolic dysfunction, history of rheumatic fever.   NIH on Admission 7  Acute Ischemic Infarct:  right parietal lobe  Etiology: Cardioembolic Code Stroke CT head Acute/subacute infarct in the right parietal  ASPECTS 8   CTA head & neck no LVO MRI ordered has pacemaker unsure if compatible with MRI 2D Echo EF 60 to 65%.  Moderate concentric LVH.  Left atrium severely dilated, right atrium mildly dilated LDL 88 HgbA1c 6.0 VTE prophylaxis -SCDs Eliquis (apixaban) daily prior to admission, now on aspirin 81 mg daily restart Eliquis in 2 to 3 days Therapy recommendations:  Pending Disposition: Pending  History of DVT Eliquis Eliquis on hold due to acute  stroke  Hypertension Home meds: Metoprolol 25 mg Stable Blood Pressure Goal: SBP less than 160 or BP less than 180/105   Hyperlipidemia Home meds: None,  LDL 88, goal < 70 Continue statin at discharge  Dysphagia Patient has post-stroke dysphagia, SLP consulted    Diet   Diet regular Room service appropriate? Yes; Fluid consistency: Thin   Advance diet as tolerated  Other Stroke Risk Factors    Other Active Problems Cognitive issues  Hospital day # 2  Patient with A-fib on Eliquis with good compliance presents with embolic right parietal MCA branch infarct.  Continue  Physical occupational and speech therapy consults.  Mobilize out of bed.  She will likely need inpatient rehab.  Long discussion with the patient and son and daughter about risk-benefit of switching Eliquis to Xarelto or Pradaxa with absence of  definitive data suggesting that this is superior.  we will ask pharmacist to check into patient's co-pay for Pradaxa and if it does not significantly go up may consider switching otherwise continuing Eliquis.  Patient is not a candidate for Watchman device due to her advanced age and comorbidities.  Long discussion with patient and son and daughter and answered questions.  Greater than 50% time during this 35-minute visit was spent in counseling and coordination of care about embolic stroke and discussion about risk-benefit of continued Eliquis versus switching to alternative agents and answering questions. Stroke team will sign off.  Follow-up with an outpatient stroke clinic in 2 months.  Kindly call for questions if any  Delia Heady, MD Medical Director [facility] Pager: [phone] 06/09/2023 2:18 PM  To contact Stroke Continuity provider, please refer to [email or web address]. After hours, contact General Neurology

## 2023-06-09 NOTE — Plan of Care (Signed)
 Problem: Education: Goal: Knowledge of General Education information will improve Description: Including pain rating scale, medication(s)/side effects and non-pharmacologic comfort measures 06/09/2023 1706 by Juluis Mire, RN Outcome: Progressing 06/09/2023 1629 by Juluis Mire, RN Outcome: Progressing   Problem: Health Behavior/Discharge Planning: Goal: Ability to manage health-related needs will improve 06/09/2023 1706 by Juluis Mire, RN Outcome: Progressing 06/09/2023 1629 by Juluis Mire, RN Outcome: Progressing   Problem: Clinical Measurements: Goal: Ability to maintain clinical measurements within normal limits will improve 06/09/2023 1706 by Juluis Mire, RN Outcome: Progressing 06/09/2023 1629 by Juluis Mire, RN Outcome: Progressing Goal: Will remain free from infection 06/09/2023 1706 by Juluis Mire, RN Outcome: Progressing 06/09/2023 1629 by Juluis Mire, RN Outcome: Progressing Goal: Diagnostic test results will improve 06/09/2023 1706 by Juluis Mire, RN Outcome: Progressing 06/09/2023 1629 by Juluis Mire, RN Outcome: Progressing Goal: Respiratory complications will improve 06/09/2023 1706 by Juluis Mire, RN Outcome: Progressing 06/09/2023 1629 by Juluis Mire, RN Outcome: Progressing Goal: Cardiovascular complication will be avoided 06/09/2023 1706 by Juluis Mire, RN Outcome: Progressing 06/09/2023 1629 by Juluis Mire, RN Outcome: Progressing   Problem: Activity: Goal: Risk for activity intolerance will decrease 06/09/2023 1706 by Juluis Mire, RN Outcome: Progressing 06/09/2023 1629 by Juluis Mire, RN Outcome: Progressing   Problem: Nutrition: Goal: Adequate nutrition will be maintained 06/09/2023 1706 by Juluis Mire, RN Outcome: Progressing 06/09/2023 1629 by Juluis Mire, RN Outcome: Progressing   Problem: Coping: Goal: Level  of anxiety will decrease 06/09/2023 1706 by Juluis Mire, RN Outcome: Progressing 06/09/2023 1629 by Juluis Mire, RN Outcome: Progressing   Problem: Elimination: Goal: Will not experience complications related to bowel motility 06/09/2023 1706 by Juluis Mire, RN Outcome: Progressing 06/09/2023 1629 by Juluis Mire, RN Outcome: Progressing Goal: Will not experience complications related to urinary retention 06/09/2023 1706 by Juluis Mire, RN Outcome: Progressing 06/09/2023 1629 by Juluis Mire, RN Outcome: Progressing   Problem: Pain Managment: Goal: General experience of comfort will improve and/or be controlled 06/09/2023 1706 by Juluis Mire, RN Outcome: Progressing 06/09/2023 1629 by Juluis Mire, RN Outcome: Progressing   Problem: Safety: Goal: Ability to remain free from injury will improve 06/09/2023 1706 by Juluis Mire, RN Outcome: Progressing 06/09/2023 1629 by Juluis Mire, RN Outcome: Progressing   Problem: Skin Integrity: Goal: Risk for impaired skin integrity will decrease 06/09/2023 1706 by Juluis Mire, RN Outcome: Progressing 06/09/2023 1629 by Juluis Mire, RN Outcome: Progressing   Problem: Education: Goal: Knowledge of disease or condition will improve 06/09/2023 1706 by Juluis Mire, RN Outcome: Progressing 06/09/2023 1629 by Juluis Mire, RN Outcome: Progressing Goal: Knowledge of secondary prevention will improve (MUST DOCUMENT ALL) 06/09/2023 1706 by Juluis Mire, RN Outcome: Progressing 06/09/2023 1629 by Juluis Mire, RN Outcome: Progressing Goal: Knowledge of patient specific risk factors will improve (DELETE if not current risk factor) 06/09/2023 1706 by Juluis Mire, RN Outcome: Progressing 06/09/2023 1629 by Juluis Mire, RN Outcome: Progressing   Problem: Ischemic Stroke/TIA Tissue Perfusion: Goal: Complications of  ischemic stroke/TIA will be minimized 06/09/2023 1706 by Juluis Mire, RN Outcome: Progressing 06/09/2023 1629 by Juluis Mire, RN Outcome: Progressing   Problem: Coping: Goal: Will verbalize positive feelings about self 06/09/2023 1706 by Juluis Mire, RN Outcome: Progressing 06/09/2023 1629 by Juluis Mire, RN Outcome: Progressing Goal: Will identify appropriate support needs 06/09/2023  1706 by Juluis Mire, RN Outcome: Progressing 06/09/2023 1629 by Juluis Mire, RN Outcome: Progressing   Problem: Health Behavior/Discharge Planning: Goal: Ability to manage health-related needs will improve 06/09/2023 1706 by Juluis Mire, RN Outcome: Progressing 06/09/2023 1629 by Juluis Mire, RN Outcome: Progressing Goal: Goals will be collaboratively established with patient/family 06/09/2023 1706 by Juluis Mire, RN Outcome: Progressing 06/09/2023 1629 by Juluis Mire, RN Outcome: Progressing   Problem: Self-Care: Goal: Ability to participate in self-care as condition permits will improve 06/09/2023 1706 by Juluis Mire, RN Outcome: Progressing 06/09/2023 1629 by Juluis Mire, RN Outcome: Progressing Goal: Verbalization of feelings and concerns over difficulty with self-care will improve 06/09/2023 1706 by Juluis Mire, RN Outcome: Progressing 06/09/2023 1629 by Juluis Mire, RN Outcome: Progressing Goal: Ability to communicate needs accurately will improve 06/09/2023 1706 by Juluis Mire, RN Outcome: Progressing 06/09/2023 1629 by Juluis Mire, RN Outcome: Progressing   Problem: Nutrition: Goal: Risk of aspiration will decrease 06/09/2023 1706 by Juluis Mire, RN Outcome: Progressing 06/09/2023 1629 by Juluis Mire, RN Outcome: Progressing Goal: Dietary intake will improve 06/09/2023 1706 by Juluis Mire, RN Outcome: Progressing 06/09/2023 1629 by Juluis Mire, RN Outcome: Progressing

## 2023-06-09 NOTE — Progress Notes (Addendum)
 Progress Note   Patient: Madison White LOV:[id number] DOB: 01-Oct-1928 DOA: 06/07/2023     2 DOS: the patient was seen and examined on 06/09/2023   Brief hospital course:  Madison White is a 89 y.o. female with medical history significant history of DVT on Eliquis, nonischemic cardiomyopathy, pacemaker, hyperlipidemia and hypertension.  The patient had a witnessed fall getting out of bed this morning which is unusual for her.  She seemed just fine after the fall and was able to walk to the bathroom with help and had some breakfast and sit in the chair.  The patient lives with her daughter who check on her frequently through the morning.  She seemed to be at her baseline other than the fact that she slept a lot this morning.  At about 1030 The patient got herself up to go to the bathroom she was walking with her walker and the daughter came in and saw at that moment the patient seemed to have a facial droop.  She also was not holding her walker properly instead of having her hand around the walker she just had her fist sitting on top of the walker.  The patient was also confused and talking about a chair that was not there.  All of these things created panic and the patient's daughter who took her mother to the car immediately and brought her to the ER.  In the emergency department a code stroke was called.  The there was concern that the patient's last known well was last night so she did not meet criteria for intervention.  Per the neurology report her scan showed a moderate-sized right CVA.  The patient will be admitted to the hospitalist service.   Assessment and Plan:  Acute/subacute CVA Etiology appears to be cardioembolic and imaging shows an acute/subacute infarct in the right parietal lobe with loss of gray-white differentiation at both the ganglionic and super ganglionic levels of the right parietal lobe. CT angiogram of the head and neck does not show any LVO 2D echocardiogram shows an  LVEF of 60 to 65% with moderate concentric LVH. MRI showed right MCA middle and posterior division infarcts with confluent petechial hemorrhage in the right parietal area of involvement which was most apparent by CT. No intracranial mass effect. Underlying chronic small and medium-sized vessel ischemia, scattered chronic microhemorrhages not rising to the level of amyloid angiopathy. Discussed with neurologist who recommends to resume anticoagulation.   Recommends Pradaxa .Continue aspirin 81 mg Appreciate PT input, acute rehab was initially recommended but has been downgraded to SNF Appreciate speech and OT evaluation Resume metoprolol 12.5 twice daily     History of persistent A-fib Patient was on Eliquis  Continue metoprolol for rate control      Dementia  Continue Aricept        Subjective: Patient is seen and examined at the bedside.  Sitting up in a recliner.  Physical Exam: Vitals:   06/08/23 2026 06/08/23 2356 06/09/23 0448 06/09/23 0857  BP: (!) 148/70 (!) 138/57 (!) 161/70 (!) 138/55  Pulse: 71 74 70 69  Resp: 17 17 17 16   Temp: 98.6 F (37 C) 98 F (36.7 C) 98 F (36.7 C) 98.4 F (36.9 C)  TempSrc: Oral   Oral  SpO2: 94% 94% 94% 96%   General: No acute distress, well developed, well nourished HEENT: Normocephalic, atraumatic, PERRL Cardiovascular: Normal rate and rhythm. Distal pulses intact. Pulmonary: Normal pulmonary effort, normal breath sounds Gastrointestinal: Nondistended abdomen, soft, non-tender,  normoactive bowel sounds, no organomegaly Musculoskeletal:Normal ROM, no lower ext edema Lymphadenopathy: No cervical LAD. Skin: Skin is warm and dry. Neuro:  AAOx1, left arm weakness and uncoordination. when she squeezes my hand she has a hard time letting go.  She has past pointing trying to find her nose. Facial droop seems to be resolved. PSYCH: Follows simple commands after being reminded a few times.    Data Reviewed:  There are no new results  to review at this time.  Family Communication: Plan of care discussed with son and daughter at the bedside.  They verbalized understanding and agree with the plan  Disposition: Status is: Inpatient Remains inpatient appropriate because: Discharge planning  Planned Discharge Destination:  TBD    Time spent: 33 minutes  Author: Lucile Shutters, MD 06/09/2023 3:24 PM  For on call review [email or web address].

## 2023-06-09 NOTE — NC FL2 (Signed)
 [place] MEDICAID FL2 LEVEL OF CARE FORM     IDENTIFICATION  Patient Name: Madison White Birthdate: 1928-10-11 Sex: female Admission Date (Current Location): 06/07/2023  [facility] and [place] Number:  Producer, television/film/video and Address:  [facility], 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   Long-term current use of proton pump inhibitor therapy 04/09/2022   Current use of long term anticoagulation 04/09/2022   Complete heart block (HCC) 12/18/2021   Vascular dementia (HCC) 06/18/2021   Overactive bladder 03/19/2021   History of DVT of lower extremity left 12/27/2019   Persistent atrial fibrillation (HCC) 08/01/2019   Presence of biventricular cardiac pacemaker 04/05/2019   Recurrent nephrolithiasis 06/17/2017   History of complete heart block 05/05/2017   Renal cyst 03/01/2017   Hydronephrosis with urinary obstruction due to renal calculus 03/01/2017   Total knee replacement status, bilateral 11/06/2016   Nonischemic cardiomyopathy (HCC) 11/17/2014   Upper airway cough syndrome 02/26/2014   Constipation, chronic 02/26/2014   GERD (gastroesophageal reflux disease) 11/19/2012   Dyslipidemia 09/02/2012   Left bundle branch block 12/18/2011   Essential hypertension 05/31/2011    Orientation RESPIRATION BLADDER Height & Weight     Self, Place (sometimes time and situation intermittently)  Normal Continent Weight:   Height:      BEHAVIORAL SYMPTOMS/MOOD NEUROLOGICAL BOWEL NUTRITION STATUS      Continent Diet (Regular)  AMBULATORY STATUS COMMUNICATION OF NEEDS Skin   Limited Assist Verbally Normal                       Personal Care Assistance Level of Assistance  Bathing, Feeding, Dressing Bathing Assistance: Limited assistance Feeding assistance: Limited assistance Dressing Assistance: Limited assistance     Functional Limitations Info  Sight Sight Info: Impaired (impaired left eye vision)        SPECIAL CARE FACTORS FREQUENCY  PT (By licensed PT), OT (By licensed OT)     PT Frequency: 5x/wk OT Frequency: 5x/wk            Contractures Contractures Info: Not present    Additional Factors Info  Code Status, Allergies, Psychotropic Code Status Info: Full Allergies Info: Penicillins, Nickel, Avapro (Irbesartan), Lisinopril, Simvastatin, Latex, Sulfa Antibiotics, Sulfamethoxazole Psychotropic Info: Aricept 10mg  daily at bed         Current Medications (06/09/2023):  This is the current hospital active medication list Current Facility-Administered Medications  Medication Dose Route Frequency Provider Last Rate Last Admin   aspirin chewable tablet 81 mg  81 mg Oral Daily Buena Irish, MD   81 mg at 06/09/23 5621   donepezil (ARICEPT) tablet 10 mg  10 mg Oral QHS Buena Irish, MD   10 mg at 06/08/23 2103   metoprolol tartrate (LOPRESSOR) tablet 12.5 mg  12.5 mg Oral BID Agbata, Tochukwu, MD       sodium chloride flush (NS) 0.9 % injection 3 mL  3 mL Intravenous  Once Ernie Avena, MD         Discharge Medications: Please see discharge summary for a list of discharge medications.  Relevant Imaging Results:  Relevant Lab Results:   Additional Information SS#: [id number]  Baldemar Lenis, LCSW

## 2023-06-09 NOTE — Progress Notes (Addendum)
 PHARMACY - ANTICOAGULATION CONSULT NOTE  Pharmacy Consult for Pradaxa Indication: atrial fibrillation  Allergies  Allergen Reactions   Penicillins Hives and Swelling   Nickel Swelling and Rash   Avapro [Irbesartan] Other (See Comments)    Unknown rxn   Lisinopril Swelling    Swelling around eyes; also says it caused increased sugar and BP   Simvastatin Other (See Comments)    unknown   Latex Rash   Sulfa Antibiotics Rash   Sulfamethoxazole Rash    Patient Measurements:     Vital Signs: Temp: 98.4 F (36.9 C) (03/25 0857) Temp Source: Oral (03/25 0857) BP: 138/55 (03/25 0857) Pulse Rate: 69 (03/25 0857)  Labs: Recent Labs    06/07/23 1202  HGB 12.8  HCT 38.4  PLT 331  APTT 28  LABPROT 16.5*  INR 1.3*  CREATININE 0.74    CrCl cannot be calculated (Unknown ideal weight.).   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   History of blood transfusion    History of rheumatic fever    HTN (hypertension)    hx of refusing treatment--White coat HTN and/or situational HTN (?)    Hyperlipidemia    hx of refusing treatment   Insulin resistance    A1c's excellent per old records (6.1 in 2008 and 2009)   Left bundle branch block 12/2011   Dr. Royann Shivers at [facility]; ECHO and myocardial perfusion scan showed  septal and apical wall motion abnormality but she had no significant valvular disease and no ischemia.  She did have mildly decreased EF (39% on lexiscan and 50% on echo) and abnl LV relaxation.  Mild amount of PVCs.  Pt at higher risk for other conduction abnormalities, good chance  of eventually requiring a pacemaker.    LVH (left ventricular hypertrophy)    Nonischemic cardiomyopathy (HCC) 2016   LV dysfunction due to LBBB/septal dyssynchrony   Osteoarthritis    bilat knees--needs bilat TKA.  Ortho is trying steroid injections as of 10/23/15.   Presence of biventricular cardiac pacemaker 04/05/2019   Presence of permanent cardiac pacemaker 05/05/2017   Seasonal allergies    Solitary pulmonary nodule 05/2016   8 mm pleural based nodule in RLL.  Radiology recommended f/u noncontrast CT in 6-12 mo.    Medications:  Medications Prior to Admission  Medication Sig Dispense Refill Last Dose/Taking   calcium-vitamin D (OSCAL WITH D) 500-200 MG-UNIT tablet Take 1 tablet by mouth.   06/07/2023   Cholecalciferol 25 MCG (1000 UT) tablet Take 1,000 Units by mouth at bedtime.   06/06/2023   cyanocobalamin 100 MCG tablet Take 100 mcg by mouth daily.   06/07/2023   ELDERBERRY PO Take 1 tablet by mouth daily in the afternoon. Chew Gummie   06/07/2023   ELIQUIS 5 MG TABS tablet TAKE 1 TABLET BY MOUTH TWICE A DAY 60 tablet 2 06/07/2023 at 10:00 AM   Iron-Vitamin C 100-250 MG TABS Take 1 tablet by mouth at bedtime.   06/06/2023   metoprolol tartrate (LOPRESSOR) 25 MG tablet TAKE 1 TABLET BY MOUTH TWICE A DAY (Patient taking differently: Take 12.5  mg by mouth 2 (two) times daily.) 180 tablet 3 06/07/2023   MYRBETRIQ 25 MG TB24 tablet TAKE 1 TABLET (25 MG TOTAL) BY MOUTH DAILY. (Patient taking differently: Take 25 mg by mouth at bedtime.) 30 tablet 5 06/06/2023   omeprazole (PRILOSEC) 10 MG capsule Take 10 mg by mouth daily.   06/07/2023   PEDIATRIC MULTIVITAMINS-IRON PO Take 2 tablets by mouth daily in the afternoon. Chew gummie   06/07/2023   donepezil (ARICEPT) 10 MG tablet TAKE 1 TABLET BY MOUTH EVERYDAY AT BEDTIME (Patient not taking: Reported on 06/08/2023) 90 tablet 3 Not Taking    Assessment: 89 yo F on apixaban PTA for afib presented with acute CVA.  Patient compliant with PTA apixaban,  considered apixaban failure.  Pharmacy has been consulted to switch the patient to Pradaxa.  Using PTA weight of 68kg, calculated CrCl 52 ml./min.  Goal of Therapy:  Therapeutic anticoagulation Monitor platelets by anticoagulation protocol: Yes   Plan:  Start Pradaxa 150mg  PO BID D/C ASA per Dr. Pearlean Brownie.  [employer], Pharm.D., BCPS Clinical Pharmacist  **Pharmacist phone directory can be found on [email or web address] listed under [facility].  06/09/2023 4:18 PM

## 2023-06-09 NOTE — TOC Progression Note (Signed)
 Transition of Care [facility]) - Progression Note    Patient Details  Name: Madison White MRN: [id number] Date of Birth: 07/30/1928  [facility] CM/SW Contact  Baldemar Lenis, [place] Phone Number: 06/09/2023, 4:00 PM  Clinical Narrative:   CSW updated by Rehab Admissions that patient was recommended for SNF by CIR MD. CSW completed workup for bed offers, will follow up with family for disposition.    Expected Discharge Plan: Skilled Nursing Facility Barriers to Discharge: Continued Medical Work up, English as a second language teacher  Expected Discharge Plan and Services   Discharge Planning Services: CM Consult Post Acute Care Choice: IP Rehab Living arrangements for the past 2 months: Single Family Home                                       Social Determinants of Health (SDOH) Interventions SDOH Screenings   Food Insecurity: No Food Insecurity (06/07/2023)  Housing: Low Risk  (06/07/2023)  Transportation Needs: No Transportation Needs (06/07/2023)  Utilities: Not At Risk (06/07/2023)  Depression (PHQ2-9): Low Risk  (02/09/2023)  Financial Resource Strain: Low Risk  (02/09/2023)  Physical Activity: Inactive (02/09/2023)  Social Connections: Socially Isolated (06/07/2023)  Stress: No Stress Concern Present (02/09/2023)  Tobacco Use: Medium Risk (06/07/2023)  Health Literacy: Adequate Health Literacy (02/09/2023)    Readmission Risk Interventions     No data to display

## 2023-06-09 NOTE — Progress Notes (Addendum)
 Inpatient Rehab Coordinator Note:  I spoke with pt's daughter, Olegario Messier, over the phone to discuss CIR recommendations and goals/expectations of CIR stay.  We reviewed 3 hrs/day of therapy, physician follow up, and average length of stay 2 weeks (dependent upon progress) with goals of supervision 24/7.  Per Olegario Messier, family does want rehab and are hopeful pt will return to baseline of assist for bathing/dressing, but mod I for household mobility and toileting.  Pt's other daughter, Marisue Humble, lives with patient and works from home but is returning to the office soon.  I'm not confident we could reach that level of independence with a short rehab stay, and deferred questions regarding cognitive recovery to her providers.  Dr. Riley Kill feels more appropriate for SNF.   I will f/u tomorrow, but agree she will most likely need SNF.    Estill Dooms, PT, DPT Admissions Coordinator [phone] 06/09/23  3:46 PM

## 2023-06-09 NOTE — Consult Note (Signed)
 Physical Medicine and Rehabilitation Consult Reason for Consult:impaired functional mobility Referring Physician: Agbata   HPI: Madison White is a 89 y.o. female with a history of DVT on Eliquis, nonischemic cardiomyopathy, pacemaker, and hypertension who apparently fell on 06/08/2023 while getting out of bed.  She was able to recover but then daughter noticed that she had some left facial droop and the patient was brought to the emergency room.  A code stroke was called and imaging revealed an acute/subacute infarct in the right parietal lobe with loss of gray-white differentiation at both the ganglionic and super ganglionic levels.  Neurology was consulted and felt that the events was cardioembolic.  CT angiogram revealed no large vessel occlusion.  Eliquis currently on hold due to risk of hemorrhagic conversion.  Patient was up with physical therapy yesterday and was min assist for sit to stand transfers and 450 feet of gait using a rolling walker.  She was mod to max assist with basic ADLs.  Speech-language pathology assessing and treating patient for cognition.  Patient lives at home with daughter in a two-level house with one-step to enter.  Patient is able to live on the main level which has a bedroom and bathroom.  Patient uses a rolling walker prior to admission and was fairly independent without but needs some assistance with lower body dressing and IADLs.   Review of Systems  Unable to perform ROS: Mental acuity   Past Medical History:  Diagnosis Date   Acute lower GI bleeding    Diastolic dysfunction, left ventricle    GERD (gastroesophageal reflux disease)    Headache disorder 2016   R frontoparietal pain, episodic (paroxysmal hemicrania vs trig neuralgia of ophth br of CN V)--MRI brain 11/2014 showed age related changes but no explanation for her HA's.  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   History of blood transfusion    History of  rheumatic fever    HTN (hypertension)    hx of refusing treatment--White coat HTN and/or situational HTN (?)    Hyperlipidemia    hx of refusing treatment   Insulin resistance    A1c's excellent per old records (6.1 in 2008 and 2009)   Left bundle branch block 12/2011   Dr. Royann Shivers at [facility]; ECHO and myocardial perfusion scan showed  septal and apical wall motion abnormality but she had no significant valvular disease and no ischemia.  She did have mildly decreased EF (39% on lexiscan and 50% on echo) and abnl LV relaxation.  Mild amount of PVCs.  Pt at higher risk for other conduction abnormalities, good chance of eventually requiring a pacemaker.    LVH (left ventricular hypertrophy)    Nonischemic cardiomyopathy (HCC) 2016   LV dysfunction due to LBBB/septal dyssynchrony   Osteoarthritis    bilat knees--needs bilat TKA.  Ortho is trying steroid injections as of 10/23/15.   Presence of biventricular cardiac pacemaker 04/05/2019   Presence of permanent cardiac pacemaker 05/05/2017   Seasonal allergies    Solitary pulmonary nodule 05/2016   8 mm pleural based nodule in RLL.  Radiology recommended f/u noncontrast CT in 6-12 mo.   Past Surgical History:  Procedure Laterality Date   ABDOMINAL HYSTERECTOMY  1963   At the time of her last C/S; says she had partial bladder resection at that time as well   BI-VENTRICULAR PACEMAKER INSERTION (CRT-P)  05/05/2017   BIV PACEMAKER INSERTION CRT-P N/A 05/05/2017   Procedure:  BIV PACEMAKER INSERTION CRT-P;  Surgeon: Marinus Maw, MD;  Location: [facility] INVASIVE CV LAB;  Service: Cardiovascular;  Laterality: N/A;   CARDIOVASCULAR STRESS TEST  08/2005 & 12/2011   Low risk scans (on the 12/2011 scan she did have EF 39% with moderately severe LV dysfunction with septal dyssynergy probably contributed by LBBB   CARDIOVERSION N/A 08/09/2019   Procedure: CARDIOVERSION;  Surgeon: Chrystie Nose, MD;  Location[facility] ENDOSCOPY;  Service: Cardiovascular;  Laterality:  N/A;   Carotid dopplers  11/22/14; 11/30/16   NORMAL 2016 and 2018   CATARACT EXTRACTION  08/14/11   both   CESAREAN SECTION  X 5   One of her neonates died soon after birth   CHOLECYSTECTOMY     1980's   COLONOSCOPY WITH PROPOFOL N/A 09/02/2017   Procedure: COLONOSCOPY WITH PROPOFOL;  Surgeon: Meryl Dare, MD;  Location: [facility] ENDOSCOPY;  Service: Endoscopy;  Laterality: N/A;   HOT HEMOSTASIS N/A 09/02/2017   Procedure: HOT HEMOSTASIS (ARGON PLASMA COAGULATION/BICAP);  Surgeon: Meryl Dare, MD;  Location: [facility] ENDOSCOPY;  Service: Endoscopy;  Laterality: N/A;   INSERT / REPLACE / REMOVE PACEMAKER     JOINT REPLACEMENT Bilateral    knee   right knee surgery Right 05/2016   ? R TKA: no records.   STRABISMUS SURGERY  1939   TONSILLECTOMY  1938   TRANSTHORACIC ECHOCARDIOGRAM  01/13/12   Septal dyssynergy, EF 50%, LV relaxation impaired.  No significant valvular abnormalities.   Family History  Problem Relation Age of Onset   Hypertension Mother    Heart disease Mother    Hyperlipidemia Mother    Diabetes Mother    Heart disease Father    Cancer Father    Social History:  reports that she quit smoking about 65 years ago. Her smoking use included cigarettes. She has never used smokeless tobacco. She reports that she does not currently use alcohol after a past usage of about 1.0 standard drink of alcohol per week. She reports that she does not use drugs. Allergies:  Allergies  Allergen Reactions   Penicillins Hives and Swelling   Nickel Swelling and Rash   Avapro [Irbesartan] Other (See Comments)    Unknown rxn   Lisinopril Swelling    Swelling around eyes; also says it caused increased sugar and BP   Simvastatin Other (See Comments)    unknown   Latex Rash   Sulfa Antibiotics Rash   Sulfamethoxazole Rash   Medications Prior to Admission  Medication Sig Dispense Refill   calcium-vitamin D (OSCAL WITH D) 500-200 MG-UNIT tablet Take 1 tablet by mouth.     Cholecalciferol 25  MCG (1000 UT) tablet Take 1,000 Units by mouth at bedtime.     cyanocobalamin 100 MCG tablet Take 100 mcg by mouth daily.     ELDERBERRY PO Take 1 tablet by mouth daily in the afternoon. Chew Gummie     ELIQUIS 5 MG TABS tablet TAKE 1 TABLET BY MOUTH TWICE A DAY 60 tablet 2   Iron-Vitamin C 100-250 MG TABS Take 1 tablet by mouth at bedtime.     metoprolol tartrate (LOPRESSOR) 25 MG tablet TAKE 1 TABLET BY MOUTH TWICE A DAY (Patient taking differently: Take 12.5 mg by mouth 2 (two) times daily.) 180 tablet 3   MYRBETRIQ 25 MG TB24 tablet TAKE 1 TABLET (25 MG TOTAL) BY MOUTH DAILY. (Patient taking differently: Take 25 mg by mouth at bedtime.) 30 tablet 5   omeprazole (PRILOSEC) 10 MG capsule Take  10 mg by mouth daily.     PEDIATRIC MULTIVITAMINS-IRON PO Take 2 tablets by mouth daily in the afternoon. Chew gummie     donepezil (ARICEPT) 10 MG tablet TAKE 1 TABLET BY MOUTH EVERYDAY AT BEDTIME (Patient not taking: Reported on 06/08/2023) 90 tablet 3    Home: Home Living Family/patient expects to be discharged to:: Private residence Living Arrangements: Children Available Help at Discharge: Family Type of Home: House Home Access: Stairs to enter Secretary/administrator of Steps: 1 Home Layout: Two level, Able to live on main level with bedroom/bathroom Bathroom Shower/Tub: Health visitor: Standard Home Equipment: Agricultural consultant (2 wheels), Shower seat, Hand held shower head, Grab bars - tub/shower Additional Comments: stool with rails at the bed for bed entry  Lives With: Daughter  Functional History: Prior Function Prior Level of Function : Needs assist  Cognitive Assist : ADLs (cognitive) Physical Assist : ADLs (physical) ADLs Comments: Daughter stated she has been assisting patient with adls - step by step for dressing and bathing Functional Status:  Mobility: Bed Mobility Overal bed mobility: Needs Assistance Bed Mobility: Supine to Sit Supine to sit: Min assist, HOB  elevated General bed mobility comments: sat up on her own, assist for scooting hips Transfers Overall transfer level: Needs assistance Equipment used: Rolling walker (2 wheels), 1 person hand held assist Transfers: Sit to/from Stand Sit to Stand: Min assist General transfer comment: assist for rising from EOB and from toilet in bathroom cues for hand placement on rail in bathroom Ambulation/Gait Ambulation/Gait assistance: Min assist Gait Distance (Feet): 150 Feet (& 10' in room with HHA for balance to bathroom and to sink to wash hands) Assistive device: Rolling walker (2 wheels), 1 person hand held assist Gait Pattern/deviations: Step-through pattern, Decreased stride length, Trunk flexed, Drifts right/left General Gait Details: assist for maneuvering obstacles on L with cues versus assist for walker management    ADL: ADL Overall ADL's : Needs assistance/impaired Eating/Feeding: Set up Grooming: Minimal assistance Upper Body Bathing: Moderate assistance Lower Body Bathing: Maximal assistance Upper Body Dressing : Moderate assistance Lower Body Dressing: Maximal assistance Toilet Transfer: Moderate assistance Functional mobility during ADLs: Minimal assistance, Rolling walker (2 wheels), Cueing for safety  Cognition: Cognition Overall Cognitive Status: Impaired/Different from baseline Arousal/Alertness: Awake/alert Orientation Level: Oriented X4 Year: Other (Comment) ("the nineties") Month:  (incorrect) Day of Week: Incorrect Attention: Sustained Sustained Attention: Impaired Sustained Attention Impairment: Verbal basic, Functional basic Memory: Impaired Memory Impairment: Storage deficit, Retrieval deficit, Decreased recall of new information Awareness: Impaired Awareness Impairment: Intellectual impairment, Emergent impairment, Anticipatory impairment Problem Solving: Impaired Problem Solving Impairment: Verbal basic, Functional basic Executive Function: Organizing,  Self Monitoring, Self Correcting Organizing: Impaired Organizing Impairment: Verbal basic, Functional basic Self Monitoring: Impaired Self Monitoring Impairment: Verbal basic, Functional basic Self Correcting: Impaired Self Correcting Impairment: Verbal basic, Functional basic Safety/Judgment: Impaired Cognition Arousal: Alert Behavior During Therapy: WFL for tasks assessed/performed Overall Cognitive Status: Impaired/Different from baseline  Blood pressure (!) 138/55, pulse 69, temperature 98.4 F (36.9 C), temperature source Oral, resp. rate 16, SpO2 96%. Physical Exam Constitutional:      General: She is not in acute distress. HENT:     Head: Normocephalic.     Right Ear: External ear normal.     Nose: Nose normal.     Mouth/Throat:     Mouth: Mucous membranes are moist.  Eyes:     Extraocular Movements: Extraocular movements intact.     Pupils: Pupils are equal, round, and reactive to  light.  Cardiovascular:     Rate and Rhythm: Normal rate.  Pulmonary:     Effort: Pulmonary effort is normal.  Abdominal:     Palpations: Abdomen is soft.  Musculoskeletal:        General: No swelling or tenderness.     Cervical back: Normal range of motion.  Skin:    General: Skin is warm.  Neurological:     Mental Status: She is alert.     Comments: Pt is alert, oriented to self. Didn't know why she was here nor date or where she was. Followed basic commands. Normal language. CN exam was non-focal except for mild left facial droop. MMT: RUE 4/5. LUE 3- to 3/5. RLE 4/5. LLE 3/5 but inconsistent effort. Senses pain and light touch in all 4's. No abnl muscle tone. DTR's absent. .      No results found for this or any previous visit (from the past 24 hours). ECHOCARDIOGRAM COMPLETE Result Date: 06/08/2023    ECHOCARDIOGRAM REPORT   Patient Name:   Madison White Date of Exam: 06/08/2023 Medical Rec #:  [id number]       Height:       61.0 in Accession #:    [id number]      Weight:        150.0 lb Date of Birth:  06/27/1928       BSA:          1.671 m Patient Age:    89 years        BP:           152/89 mmHg Patient Gender: F               HR:           70 bpm. Exam Location:  Inpatient Procedure: 2D Echo, Cardiac Doppler and Color Doppler (Both Spectral and Color            Flow Doppler were utilized during procedure). Indications:    Stroke I63.9  History:        Patient has prior history of Echocardiogram examinations, most                 recent 12/05/2021. Non-ishcemic CMO; Risk Factors:Hypertension                 and Dyslipidemia.  Sonographer:    Harriette Bouillon RDCS Referring Phys: [phone] CLAUDIA CLAIBORNE IMPRESSIONS  1. Left ventricular ejection fraction, by estimation, is 60 to 65%. The left ventricle has normal function. The left ventricle has no regional wall motion abnormalities. There is moderate concentric left ventricular hypertrophy. Left ventricular diastolic parameters are indeterminate.  2. Right ventricular systolic function is normal. The right ventricular size is normal. Tricuspid regurgitation signal is inadequate for assessing PA pressure.  3. Left atrial size was severely dilated.  4. Right atrial size was mildly dilated.  5. The mitral valve is degenerative. Mild mitral valve regurgitation. No evidence of mitral stenosis. Moderate mitral annular calcification.  6. The aortic valve was not well visualized. There is mild calcification of the aortic valve. Aortic valve regurgitation is not visualized. Mild aortic valve stenosis. Aortic valve area, by VTI measures 1.38 cm. Aortic valve mean gradient measures 6.0 mmHg. Aortic valve Vmax measures 1.62 m/s.  7. The inferior vena cava is dilated in size with <50% respiratory variability, suggesting right atrial pressure of 15 mmHg. FINDINGS  Left Ventricle: Left ventricular ejection fraction, by estimation, is 60 to 65%. The  left ventricle has normal function. The left ventricle has no regional wall motion abnormalities. The left  ventricular internal cavity size was normal in size. There is  moderate concentric left ventricular hypertrophy. Left ventricular diastolic parameters are indeterminate. Right Ventricle: The right ventricular size is normal. No increase in right ventricular wall thickness. Right ventricular systolic function is normal. Tricuspid regurgitation signal is inadequate for assessing PA pressure. Left Atrium: Left atrial size was severely dilated. Right Atrium: Right atrial size was mildly dilated. Pericardium: There is no evidence of pericardial effusion. Mitral Valve: The mitral valve is degenerative in appearance. Moderate mitral annular calcification. Mild mitral valve regurgitation. No evidence of mitral valve stenosis. Tricuspid Valve: The tricuspid valve is normal in structure. Tricuspid valve regurgitation is trivial. No evidence of tricuspid stenosis. Aortic Valve: The aortic valve was not well visualized. There is mild calcification of the aortic valve. Aortic valve regurgitation is not visualized. Mild aortic stenosis is present. Aortic valve mean gradient measures 6.0 mmHg. Aortic valve peak gradient measures 10.5 mmHg. Aortic valve area, by VTI measures 1.38 cm. Pulmonic Valve: The pulmonic valve was not well visualized. Pulmonic valve regurgitation is not visualized. No evidence of pulmonic stenosis. Aorta: The aortic root is normal in size and structure. Venous: The inferior vena cava is dilated in size with less than 50% respiratory variability, suggesting right atrial pressure of 15 mmHg. IAS/Shunts: No atrial level shunt detected by color flow Doppler. Additional Comments: A device lead is visualized.  LEFT VENTRICLE PLAX 2D LVIDd:         4.10 cm   Diastology LVIDs:         3.20 cm   LV e' medial:    8.38 cm/s LV PW:         1.30 cm   LV E/e' medial:  12.2 LV IVS:        1.30 cm   LV e' lateral:   6.42 cm/s LVOT diam:     2.00 cm   LV E/e' lateral: 15.9 LV SV:         43 LV SV Index:   26 LVOT Area:      3.14 cm  RIGHT VENTRICLE             IVC RV S prime:     11.10 cm/s  IVC diam: 2.30 cm TAPSE (M-mode): 1.4 cm LEFT ATRIUM             Index        RIGHT ATRIUM           Index LA diam:        4.30 cm 2.57 cm/m   RA Area:     11.50 cm LA Vol (A2C):   74.4 ml 44.51 ml/m  RA Volume:   24.00 ml  14.36 ml/m LA Vol (A4C):   53.1 ml 31.77 ml/m LA Biplane Vol: 64.9 ml 38.83 ml/m  AORTIC VALVE AV Area (Vmax):    1.56 cm AV Area (Vmean):   1.62 cm AV Area (VTI):     1.38 cm AV Vmax:           162.00 cm/s AV Vmean:          109.000 cm/s AV VTI:            0.314 m AV Peak Grad:      10.5 mmHg AV Mean Grad:      6.0 mmHg LVOT Vmax:         80.70  cm/s LVOT Vmean:        56.100 cm/s LVOT VTI:          0.138 m LVOT/AV VTI ratio: 0.44  AORTA Ao Root diam: 2.60 cm Ao Asc diam:  3.10 cm MITRAL VALVE MV Area (PHT): 3.65 cm     SHUNTS MV Decel Time: 208 msec     Systemic VTI:  0.14 m MV E velocity: 102.00 cm/s  Systemic Diam: 2.00 cm MV A velocity: 32.30 cm/s MV E/A ratio:  3.16 Arvilla Meres MD Electronically signed by Arvilla Meres MD Signature Date/Time: 06/08/2023/3:16:23 PM    Final    DG Chest Portable 1 View Result Date: 06/07/2023 CLINICAL DATA:  89 year old female with fall, left side weakness. EXAM: PORTABLE CHEST 1 VIEW COMPARISON:  Chest radiographs 03/11/2021 and earlier. FINDINGS: Portable AP view at 1229 hours. Chronic left chest cardiac pacemaker. Mediastinal contours are stable and within normal limits. Lung volumes are stable and within normal limits. Allowing for portable technique the lungs are clear. Visualized tracheal air column is within normal limits. Chronic ORIF proximal right humerus. No acute osseous abnormality identified. Paucity of bowel gas in the visible abdomen. IMPRESSION: No acute cardiopulmonary abnormality or acute traumatic injury identified. Electronically Signed   By: Odessa Fleming M.D.   On: 06/07/2023 12:57   CT ANGIO HEAD NECK W WO CM W PERF (CODE STROKE) Result Date:  06/07/2023 CLINICAL DATA:  Code stroke.  Left-sided weakness. EXAM: CT ANGIOGRAPHY HEAD AND NECK TECHNIQUE: Multidetector CT imaging of the head and neck was performed using the standard protocol during bolus administration of intravenous contrast. Multiplanar CT image reconstructions and MIPs were obtained to evaluate the vascular anatomy. Carotid stenosis measurements (when applicable) are obtained utilizing NASCET criteria, using the distal internal carotid diameter as the denominator. RADIATION DOSE REDUCTION: This exam was performed according to the departmental dose-optimization program which includes automated exposure control, adjustment of the mA and/or kV according to patient size and/or use of iterative reconstruction technique. CONTRAST:  OMNIPAQUE IOHEXOL 350 MG/ML SOLN COMPARISON:  CT head without contrast 06/07/2023. CT head without contrast 11/21/2017. FINDINGS: CTA NECK FINDINGS Aortic arch: Atherosclerotic calcifications are present at the aortic arch and great vessel origins. No significant stenosis is present at the origins the great vessels. No aneurysm or dissection is present. Right carotid system: Right common carotid artery is within normal limits. Calcifications are present at the right carotid bifurcation without significant stenosis. The cervical right ICA is otherwise normal. Left carotid system: The left common carotid artery is within normal limits. Atherosclerotic calcifications are present at the bifurcation. Mild tortuosity is present in the mid cervical left ICA. No focal stenosis is present. Vertebral arteries: The right vertebral artery is dominant. Both vertebral arteries originate from the subclavian arteries without significant stenoses. No significant stenosis is present in either vertebral artery in the neck. Skeleton: Mild degenerative changes are present cervical spine, most evident at C3-4 and C5-6. No focal osseous lesions are present. Other neck: The soft tissues  of the neck are otherwise unremarkable. Salivary glands are within normal limits. Thyroid is normal. No significant adenopathy is present. No focal mucosal or submucosal lesions are present. Upper chest: The lung apices are clear. The thoracic inlet is within normal limits. Review of the MIP images confirms the above findings CTA HEAD FINDINGS Anterior circulation: Atherosclerotic calcifications are present within the cavernous internal carotid arteries bilaterally without significant stenosis through the ICA termini. The A1 and M1 segments are normal. The anterior  communicating artery is patent. The MCA bifurcations are within normal limits bilaterally. Moderate stenosis is present in the proximal inferior right M2 segment. Asymmetric attenuation of posterior right MCA branches is noted. The more distal anterior superior right M3 stenosis is present. No branch vessel occlusion is present. The left MCA branch vessels and bilateral ACA branch vessels are normal. Posterior circulation: PICA origins are visualized and normal bilaterally. The vertebrobasilar junction is normal. Moderate proximal basilar artery stenosis is present. Basilar artery is small, centrally terminating at the superior cerebellar arteries. Fetal type posterior cerebral arteries are present bilaterally. Moderate stenoses are present in the left P1 and left P2 segment. More mild right P2 segment narrowing is present. No vessel occlusion is present. No aneurysm is present. Venous sinuses: The dural sinuses are patent. The straight sinus and deep cerebral veins are intact. Cortical veins are within normal limits. No significant vascular malformation is evident. Anatomic variants: Fetal type posterior cerebral arteries bilaterally. Review of the MIP images confirms the above findings IMPRESSION: 1. No large vessel occlusion. 2. Moderate stenosis of the proximal inferior right M2 segment. 3. More distal anterior superior right M3 stenosis. 4. Moderate  proximal basilar artery stenosis. 5. Moderate stenoses in the left P1 and left P2 segment. 6. More mild right P2 segment narrowing. 7. Atherosclerotic changes at the carotid bifurcations and cavernous internal carotid arteries bilaterally without significant stenosis. 8. Mild degenerative changes of the cervical spine, most evident at C3-4 and C5-6. 9.  Aortic Atherosclerosis (ICD10-I70.0). The above was relayed via text pager to Dr. Ritta Slot on 06/07/2023 at 12:17 . Electronically Signed   By: Marin Roberts M.D.   On: 06/07/2023 12:26   CT HEAD CODE STROKE WO CONTRAST Result Date: 06/07/2023 CLINICAL DATA:  Code stroke. Neuro deficit, acute, stroke suspected. Last known normal less than 1 hour ago. Left-sided weakness. EXAM: CT HEAD WITHOUT CONTRAST TECHNIQUE: Contiguous axial images were obtained from the base of the skull through the vertex without intravenous contrast. RADIATION DOSE REDUCTION: This exam was performed according to the departmental dose-optimization program which includes automated exposure control, adjustment of the mA and/or kV according to patient size and/or use of iterative reconstruction technique. COMPARISON:  MR head without contrast 12/02/2014 FINDINGS: Brain: Acute/subacute infarct is present in the right parietal lobe. Loss of gray-white differentiation present at both the ganglionic and super ganglionic levels of the right parietal lobe. Insular ribbon is intact. Basal ganglia are within normal limits. Moderate generalized atrophy and white matter disease is present otherwise. The ventricles are proportionate to the degree of atrophy. No significant extraaxial fluid collection is present. Vascular: Atherosclerotic calcifications are present at the cavernous internal carotid arteries bilaterally. No hyperdense vessel is present. Skull: Calvarium is intact. No focal lytic or blastic lesions are present. No significant extracranial soft tissue lesion is present.  Sinuses/Orbits: The paranasal sinuses and mastoid air cells are clear. Bilateral lens replacements are noted. Globes and orbits are otherwise unremarkable. ASPECTS [facility] Stroke Program Early CT Score) - Ganglionic level infarction (caudate, lentiform nuclei, internal capsule, insula, M1-M3 cortex): 6/7 - Supraganglionic infarction (M4-M6 cortex): 2/3 Total score (0-10 with 10 being normal): 8/10 IMPRESSION: 1. Acute/subacute infarct in the right parietal lobe with loss of gray-white differentiation at both the ganglionic and super ganglionic levels of the right parietal lobe. 2. Aspects is 8/10. 3. Moderate generalized atrophy and white matter disease likely reflects the sequela of chronic microvascular ischemia. The above was relayed via text pager to Dr. Amada Jupiter on 06/07/2023  at 12:07 . Electronically Signed   By: Marin Roberts M.D.   On: 06/07/2023 12:11    Assessment/Plan: Diagnosis: 89 year old female with cardioembolic right parietal infarct Does the need for close, 24 hr/day medical supervision in concert with the patient's rehab needs make it unreasonable for this patient to be served in a less intensive setting? No Co-Morbidities requiring supervision/potential complications:  -Mild dementia -History of DVT -Hypertension -Post stroke dysphagia Due to bladder management, bowel management, safety, skin/wound care, disease management, medication administration, pain management, and patient education, does the patient require 24 hr/day rehab nursing? N/A Does the patient require coordinated care of a physician, rehab nurse, therapy disciplines of PT, OT, SLP to address physical and functional deficits in the context of the above medical diagnosis(es)? No Addressing deficits in the following areas:  n/a Can the patient actively participate in an intensive therapy program of at least 3 hrs of therapy per day at least 5 days per week? No and Potentially The potential for patient to make  measurable gains while on inpatient rehab is poor Anticipated functional outcomes upon discharge from inpatient rehab are n/a  with PT, n/a with OT, n/a with SLP. Estimated rehab length of stay to reach the above functional goals is: n/a Anticipated discharge destination: Other Overall Rehab/Functional Prognosis: good and fair  POST ACUTE RECOMMENDATIONS: This patient's condition is appropriate for continued rehabilitative care in the following setting: SNF Patient has agreed to participate in recommended program. N/A Note that insurance prior authorization may be required for reimbursement for recommended care.  Comment: Spoke with daughter at bedside. Pt with fairly significant dementia prior to stroke. She required 24 hour assistance due to poor memory and safety awareness. Daughter sounds interested in respite care at home vs other options.      I have personally performed a face to face diagnostic evaluation of this patient. Additionally, I have examined the patient's medical record including any pertinent labs and radiographic images.    Thanks,  Ranelle Oyster, MD 06/09/2023

## 2023-06-09 NOTE — Progress Notes (Signed)
 Physical Therapy Treatment Patient Details Name: Madison White MRN: [id number] DOB: [birth year] Today's Date: 06/09/2023   History of Present Illness Patient is a 89 y/o female admitted 06/07/23 with fall at home and noted L side weakness and facial droop.  CTH showed moderate size R CVA.  PMH positive for h/o DVT on Eliquis, NICM, PPM, HLD, HTN.    PT Comments  Some improvement with L hand use though still needing help for attention with doff/donning pants for toileting, getting hand all the way on the walker and for safety with toileting.  She did a little better as well negotiating obstacles on the L though needing increased time some cues and occasional assistance.  She will continue to benefit from skilled PT in the acute setting and from inpatient rehab at d/c.     If plan is discharge home, recommend the following: A little help with walking and/or transfers;A little help with bathing/dressing/bathroom;Assistance with cooking/housework;Supervision due to cognitive status;Assist for transportation;Help with stairs or ramp for entrance   Can travel by private vehicle        Equipment Recommendations  None recommended by PT    Recommendations for Other Services       Precautions / Restrictions Precautions Precautions: Fall Precaution/Restrictions Comments: L inattention     Mobility  Bed Mobility Overal bed mobility: Needs Assistance Bed Mobility: Supine to Sit     Supine to sit: Used rails, Min assist     General bed mobility comments: reaching for hand hold on R to sit up    Transfers Overall transfer level: Needs assistance Equipment used: Rolling walker (2 wheels) Transfers: Sit to/from Stand Sit to Stand: Contact guard assist           General transfer comment: cue for balance from EOB and from toilet in bathroom with cues for rail    Ambulation/Gait Ambulation/Gait assistance: Contact guard assist, Min assist Gait Distance (Feet): 150 Feet (x  2) Assistive device: Rolling walker (2 wheels) Gait Pattern/deviations: Step-through pattern, Decreased stride length, Wide base of support       General Gait Details: cues and assist for obstacles on L side with RW and for placing L hand securetly on RW handle   Stairs             Wheelchair Mobility     Tilt Bed    Modified Rankin (Stroke Patients Only) Modified Rankin (Stroke Patients Only) Pre-Morbid Rankin Score: Moderate disability Modified Rankin: Moderately severe disability     Balance Overall balance assessment: Needs assistance Sitting-balance support: Feet supported, No upper extremity supported Sitting balance-Leahy Scale: Good       Standing balance-Leahy Scale: Poor Standing balance comment: washing hands at sink CGA                            Communication Communication Communication: No apparent difficulties  Cognition Arousal: Alert Behavior During Therapy: WFL for tasks assessed/performed   PT - Cognitive impairments: Orientation, Attention, Memory   Orientation impairments: Place, Time, Situation                   PT - Cognition Comments: reported back in her room, but no idea where she had been (to MRI)        Cueing    Exercises      General Comments General comments (skin integrity, edema, etc.): daughter Madison White present and son; patient toileted with A for clothing management  on L side and for balance      Pertinent Vitals/Pain Pain Assessment Pain Assessment: No/denies pain    Home Living                          Prior Function            PT Goals (current goals can now be found in the care plan section) Progress towards PT goals: Progressing toward goals    Frequency    Min 3X/week      PT Plan      Co-evaluation              AM-PAC PT "6 Clicks" Mobility   Outcome Measure  Help needed turning from your back to your side while in a flat bed without using bedrails?:  A Little Help needed moving from lying on your back to sitting on the side of a flat bed without using bedrails?: A Little Help needed moving to and from a bed to a chair (including a wheelchair)?: A Little Help needed standing up from a chair using your arms (e.g., wheelchair or bedside chair)?: A Little Help needed to walk in hospital room?: A Little Help needed climbing 3-5 steps with a railing? : Total 6 Click Score: 16    End of Session Equipment Utilized During Treatment: Gait belt Activity Tolerance: Patient tolerated treatment well Patient left: in chair;with call bell/phone within reach;with family/visitor present   PT Visit Diagnosis: Other abnormalities of gait and mobility (R26.89);Other symptoms and signs involving the nervous system (R29.898);Hemiplegia and hemiparesis Hemiplegia - Right/Left: Left Hemiplegia - dominant/non-dominant: Non-dominant Hemiplegia - caused by: Cerebral infarction     Time: 1210-1239 PT Time Calculation (min) (ACUTE ONLY): 29 min  Charges:    $Gait Training: 8-22 mins $Therapeutic Activity: 8-22 mins PT General Charges $$ ACUTE PT VISIT: 1 Visit                     Sheran Lawless, PT Acute Rehabilitation Services [phone] 06/09/2023    Madison White 06/09/2023, 5:06 PM

## 2023-06-09 NOTE — Plan of Care (Signed)

## 2023-06-09 NOTE — Discharge Instructions (Signed)
Information on my medicine - Pradaxa® (dabigatran) ° °This medication education was reviewed with me or my healthcare representative as part of my discharge preparation.  ° °Why was Pradaxa® prescribed for you? °Pradaxa® was prescribed for you to reduce the risk of forming blood clots that cause a stroke if you have a medical condition called atrial fibrillation (a type of irregular heartbeat).   ° °What do you Need to know about PradAXa®? °Take your Pradaxa® TWICE DAILY - one capsule in the morning and one tablet in the evening with or without food.  It would be best to take the doses about the same time each day. ° °The capsules should not be broken, chewed or opened - they must be swallowed whole. ° °Do not store Pradaxa in other medication containers - once the bottle is opened the Pradaxa should be used within FOUR months; throw away any capsules that haven’t been by that time. ° °Take Pradaxa® exactly as prescribed by your doctor.  DO NOT stop taking Pradaxa® without talking to the doctor who prescribed the medication.  Stopping without other stroke prevention medication to take the place of Pradaxa may increase your risk of developing a clot that causes a stroke.  Refill your prescription before you run out. ° °After discharge, you should have regular check-up appointments with your healthcare provider that is prescribing your Pradaxa®.  In the future your dose may need to be changed if your kidney function or weight changes by a significant amount. ° °What do you do if you miss a dose? °If you miss a dose, take it as soon as you remember on the same day.  If your next dose is less than 6 hours away, skip the missed dose.  Do not take two doses of PRADAXA at the same time. ° °Important Safety Information °A possible side effect of Pradaxa® is bleeding. You should call your healthcare provider right away if you experience any of the following: °? Bleeding from an injury or your nose that does not  stop. °? Unusual colored urine (red or dark brown) or unusual colored stools (red or black). °? Unusual bruising for unknown reasons. °? A serious fall or if you hit your head (even if there is no bleeding). ° °Some medicines may interact with Pradaxa® and might increase your risk of bleeding or clotting while on Pradaxa®. To help avoid this, consult your healthcare provider or pharmacist prior to using any new prescription or non-prescription medications, including herbals, vitamins, non-steroidal anti-inflammatory drugs (NSAIDs) and supplements. ° °This website has more information on Pradaxa® (dabigatran): [email or web address]://www.pradaxa.com ° ° ° °

## 2023-06-10 DIAGNOSIS — I63411 Cerebral infarction due to embolism of right middle cerebral artery: Secondary | ICD-10-CM | POA: Diagnosis not present

## 2023-06-10 LAB — CBC WITH DIFFERENTIAL/PLATELET
Abs Immature Granulocytes: 0.03 10*3/uL (ref 0.00–0.07)
Basophils Absolute: 0 10*3/uL (ref 0.0–0.1)
Basophils Relative: 1 %
Eosinophils Absolute: 0.2 10*3/uL (ref 0.0–0.5)
Eosinophils Relative: 3 %
HCT: 38.1 % (ref 36.0–46.0)
Hemoglobin: 12.7 g/dL (ref 12.0–15.0)
Immature Granulocytes: 0 %
Lymphocytes Relative: 17 %
Lymphs Abs: 1.2 10*3/uL (ref 0.7–4.0)
MCH: 30 pg (ref 26.0–34.0)
MCHC: 33.3 g/dL (ref 30.0–36.0)
MCV: 90.1 fL (ref 80.0–100.0)
Monocytes Absolute: 0.6 10*3/uL (ref 0.1–1.0)
Monocytes Relative: 8 %
Neutro Abs: 5.1 10*3/uL (ref 1.7–7.7)
Neutrophils Relative %: 71 %
Platelets: 260 10*3/uL (ref 150–400)
RBC: 4.23 MIL/uL (ref 3.87–5.11)
RDW: 14.4 % (ref 11.5–15.5)
WBC: 7.1 10*3/uL (ref 4.0–10.5)
nRBC: 0 % (ref 0.0–0.2)

## 2023-06-10 LAB — BASIC METABOLIC PANEL
Anion gap: 9 (ref 5–15)
BUN: 9 mg/dL (ref 8–23)
CO2: 27 mmol/L (ref 22–32)
Calcium: 8.9 mg/dL (ref 8.9–10.3)
Chloride: 103 mmol/L (ref 98–111)
Creatinine, Ser: 0.75 mg/dL (ref 0.44–1.00)
GFR, Estimated: >60 mL/min (ref >60)
Glucose, Bld: 141 mg/dL — ABNORMAL HIGH (ref 70–99)
Potassium: 3.6 mmol/L (ref 3.5–5.1)
Sodium: 139 mmol/L (ref 135–145)

## 2023-06-10 MED ORDER — HYDRALAZINE HCL 20 MG/ML IJ SOLN: 10.0000 mg | Freq: Four times a day (QID) | INTRAMUSCULAR | Status: DC | PRN

## 2023-06-10 MED ORDER — AMLODIPINE BESYLATE 5 MG PO TABS
5.0000 mg | ORAL_TABLET | Freq: Every day | ORAL | Status: DC
  Administered 2023-06-10 – 2023-06-11 (×2): 5 mg via ORAL
  Filled 2023-06-10 (×2): qty 1

## 2023-06-10 NOTE — Plan of Care (Signed)

## 2023-06-10 NOTE — Care Management Important Message (Signed)
 Important Message  Patient Details  Name: Madison White MRN: [id number] Date of Birth: 11-Aug-1928   Important Message Given:  Yes - Medicare IM     Dorena Bodo 06/10/2023, 2:46 PM

## 2023-06-10 NOTE — TOC Progression Note (Addendum)
 Transition of Care [facility]) - Progression Note    Patient Details  Name: Madison White MRN: [id number] Date of Birth: [birth year]  [facility] CM/SW Contact  Baldemar Lenis, [place] Phone Number: 06/10/2023, 11:45 AM  Clinical Narrative:   CSW met with patient's daughters, Olegario Messier and Marisue Humble, and spoke with daughter in law Sherrie via phone to discuss SNF placement. Lengthy discussion with family to discuss options and answer questions. CSW provided SNF choices and discussed how to research options, as well as what to think about in terms of what patient may need after rehab and how to prepare. CSW discussed insurance coverage for SNF placement and prior approval process. Family asked about palliative care involvement, and CSW explained palliative care and discussed options. CSW sent update to MD about request for palliative care. Family to review SNF options and will update CSW with choice.  UPDATE: CSW contacted [facility] per family request, but they are full. CSW updated Olegario Messier. Family has chosen [facility]. CSW confirmed bed availability with [facility], and requested CMA to initiate insurance authorization. CSW to follow.    Expected Discharge Plan: Skilled Nursing Facility Barriers to Discharge: Continued Medical Work up, English as a second language teacher  Expected Discharge Plan and Services   Discharge Planning Services: CM Consult Post Acute Care Choice: IP Rehab Living arrangements for the past 2 months: Single Family Home                                       Social Determinants of Health (SDOH) Interventions SDOH Screenings   Food Insecurity: No Food Insecurity (06/07/2023)  Housing: Low Risk  (06/07/2023)  Transportation Needs: No Transportation Needs (06/07/2023)  Utilities: Not At Risk (06/07/2023)  Depression (PHQ2-9): Low Risk  (02/09/2023)  Financial Resource Strain: Low Risk  (02/09/2023)  Physical Activity: Inactive (02/09/2023)  Social Connections:  Socially Isolated (06/07/2023)  Stress: No Stress Concern Present (02/09/2023)  Tobacco Use: Medium Risk (06/07/2023)  Health Literacy: Adequate Health Literacy (02/09/2023)    Readmission Risk Interventions     No data to display

## 2023-06-10 NOTE — Progress Notes (Signed)
 PROGRESS NOTE    Madison White  VHQ:[id number] DOB: 1928/05/13 DOA: 06/07/2023 PCP: Willow Ora, MD   Brief Narrative:  Madison White is a 89 y.o. female with medical history significant history of DVT on Eliquis, nonischemic cardiomyopathy, pacemaker, hyperlipidemia and hypertension.  The patient had a witnessed fall getting out of bed this morning which is unusual for her.  She seemed just fine after the fall and was able to walk to the bathroom with help and had some breakfast and sit in the chair.  The patient lives with her daughter who check on her frequently through the morning.  She seemed to be at her baseline other than the fact that she slept a lot this morning.  At about 1030 The patient got herself up to go to the bathroom she was walking with her walker and the daughter came in and saw at that moment the patient seemed to have a facial droop.  She also was not holding her walker properly instead of having her hand around the walker she just had her fist sitting on top of the walker.  The patient was also confused and talking about a chair that was not there.  All of these things created panic and the patient's daughter who took her mother to the car immediately and brought her to the ER.  In the emergency department a code stroke was called.  The there was concern that the patient's last known well was last night so she did not meet criteria for intervention.  Per the neurology report her scan showed a moderate-sized right CVA.  The patient will be admitted to the hospitalist service.  Assessment & Plan:   Principal Problem:   Stroke [facility]) Active Problems:   Left bundle branch block   Constipation, chronic   AMS (altered mental status)  Acute/subacute CVA Etiology appears to be cardioembolic and imaging shows an acute/subacute infarct in the right parietal lobe with loss of gray-white differentiation at both the ganglionic and super ganglionic levels of the right parietal  lobe. CT angiogram of the head and neck does not show any LVO 2D echocardiogram shows an LVEF of 60 to 65% with moderate concentric LVH. MRI showed right MCA middle and posterior division infarcts with confluent petechial hemorrhage in the right parietal area of involvement which was most apparent by CT. No intracranial mass effect. Underlying chronic small and medium-sized vessel ischemia, scattered chronic microhemorrhages not rising to the level of amyloid angiopathy. Discussed with neurologist who recommends to resume anticoagulation.   Recommends Pradaxa .Continue aspirin 81 mg.  PT OT recommends CIR, however CIR MD did not think she will meet the criteria, they recommended pursuing SNF, TOC is on board for that.  Family on board as well.  Metoprolol resumed at 12.5 mg p.o. twice daily.  Essential hypertension: Patient's blood pressure has remained elevated, recent reading of 179/70.  Will start on amlodipine 5 mg p.o. daily.   History of persistent A-fib Patient was on Eliquis and now on Pradaxa Continue metoprolol for rate control   Dementia  Continue Aricept  DVT prophylaxis: Place and maintain sequential compression device Start: 06/08/23 1846   Code Status: Prior  Family Communication: 2 daughters present at bedside.  Plan of care discussed with patient in length and he/she verbalized understanding and agreed with it.  Status is: Inpatient Remains inpatient appropriate because: Medically stable, pending placement to SNF.   Estimated body mass index is 28.34 kg/m as calculated from the  following:   Height as of this encounter: 5\' 1"  (1.549 m).   Weight as of this encounter: 68 kg.    Nutritional Assessment: Body mass index is 28.34 kg/m.[name] Seen by dietician.  I agree with the assessment and plan as outlined below: Nutrition Status:        . Skin Assessment: I have examined the patient's skin and I agree with the wound assessment as performed by the wound care RN as  outlined below:    Consultants:  Neurology  Procedures:  As above  Antimicrobials:  Anti-infectives (From admission, onward)    None         Subjective: Patient seen and examined.  She is alert and oriented to self and place.  She has no complaints.  Appears very comfortable.  2 daughters at the bedside.  Objective: Vitals:   06/09/23 2337 06/10/23 0306 06/10/23 0306 06/10/23 0744  BP: (!) 170/73  (!) 155/65 (!) 179/70  Pulse: 72 75 75 72  Resp: 17 17 17 17   Temp: 98.4 F (36.9 C) 98 F (36.7 C) 98 F (36.7 C) 97.7 F (36.5 C)  TempSrc: Oral Oral  Oral  SpO2: 96%  92% 94%  Weight:      Height:        Intake/Output Summary (Last 24 hours) at 06/10/2023 1151 Last data filed at 06/09/2023 1600 Gross per 24 hour  Intake 480 ml  Output --  Net 480 ml   Filed Weights   06/09/23 1700  Weight: 68 kg    Examination:  General exam: Appears calm and comfortable  Respiratory system: Clear to auscultation. Respiratory effort normal. Cardiovascular system: S1 & S2 heard, RRR. No JVD, murmurs, rubs, gallops or clicks. No pedal edema. Gastrointestinal system: Abdomen is nondistended, soft and nontender. No organomegaly or masses felt. Normal bowel sounds heard. Central nervous system: Alert and oriented x 2.  Extremities: Symmetric 5 x 5 power. Skin: No rashes, lesions or ulcers   Data Reviewed: I have personally reviewed following labs and imaging studies  CBC: Recent Labs  Lab 06/07/23 1202  WBC 14.4*  NEUTROABS 12.6*  HGB 12.8  HCT 38.4  MCV 88.1  PLT 331   Basic Metabolic Panel: Recent Labs  Lab 06/07/23 1202  NA 141  K 3.7  CL 105  CO2 24  GLUCOSE 153*  BUN 12  CREATININE 0.74  CALCIUM 9.2   GFR: Estimated Creatinine Clearance: 39.6 mL/min (by C-G formula based on SCr of 0.74 mg/dL). Liver Function Tests: Recent Labs  Lab 06/07/23 1202  AST 20  ALT 11  ALKPHOS 41  BILITOT 0.4  PROT 6.7  ALBUMIN 3.9   No results for input(s):  "LIPASE", "AMYLASE" in the last 168 hours. No results for input(s): "AMMONIA" in the last 168 hours. Coagulation Profile: Recent Labs  Lab 06/07/23 1202  INR 1.3*   Cardiac Enzymes: No results for input(s): "CKTOTAL", "CKMB", "CKMBINDEX", "TROPONINI" in the last 168 hours. BNP (last 3 results) No results for input(s): "PROBNP" in the last 8760 hours. HbA1C: Recent Labs    06/08/23 0757  HGBA1C 5.5   CBG: Recent Labs  Lab 06/07/23 1156  GLUCAP 151*   Lipid Profile: Recent Labs    06/08/23 0757  CHOL 160  HDL 61  LDLCALC 82  TRIG 85  CHOLHDL 2.6   Thyroid Function Tests: No results for input(s): "TSH", "T4TOTAL", "FREET4", "T3FREE", "THYROIDAB" in the last 72 hours. Anemia Panel: No results for input(s): "VITAMINB12", "FOLATE", "FERRITIN", "TIBC", "IRON", "  RETICCTPCT" in the last 72 hours. Sepsis Labs: No results for input(s): "PROCALCITON", "LATICACIDVEN" in the last 168 hours.  No results found for this or any previous visit (from the past 240 hours).   Radiology Studies: MR BRAIN WO CONTRAST Result Date: 06/09/2023 CLINICAL DATA:  89 year old female code stroke presentation on 06/07/2023 with right hemisphere cytotoxic edema on CT at that time. No large vessel occlusion by CTA. On home Eliquis. No intravenous thrombolysis administered. EXAM: MRI HEAD WITHOUT CONTRAST TECHNIQUE: Multiplanar, multiecho pulse sequences of the brain and surrounding structures were obtained without intravenous contrast. COMPARISON:  Head CT 06/07/2023.  Brain MRI 12/02/2014. FINDINGS: Brain: Right parietal hypodense area by CT demonstrates heterogeneous diffusion which is cortically restricted, confluent associated T2 and FLAIR hyperintensity, and underlying confluent petechial hemorrhage on SWI (series 7, image 68). No significant regional mass effect. Additional mostly cortically based restricted diffusion tracking from the posterior insula through the posterior operculum without blood  products on SWI (series 3, image 19). No contralateral left hemisphere or posterior fossa restricted diffusion. Scattered chronic microhemorrhages in both cerebral hemispheres, right thalamus, left cerebellum. This is not rise to the level of amyloid angiopathy. Scattered smaller areas of chronic cortical encephalomalacia in the posterior left MCA territory. Superimposed relatively mild for age patchy periventricular and deep white matter capsule T2 and FLAIR hyperintensity. Chronic lacunar infarcts in the deep gray nuclei more so the right. Vascular: Major intracranial vascular flow voids are preserved. Skull and upper cervical spine: Visualized bone marrow signal is within normal limits. Normal for age visible cervical spine. Sinuses/Orbits: Leftward gaze.  Otherwise negative. Other: Paranasal sinuses and mastoids are clear. IMPRESSION: 1. Right MCA middle and posterior division infarcts with confluent petechial hemorrhage in the right parietal area of involvement which was most apparent by CT. No intracranial mass effect. 2. Underlying chronic small and medium-sized vessel ischemia, scattered chronic microhemorrhages not rising to the level of amyloid angiopathy. Electronically Signed   By: Odessa Fleming M.D.   On: 06/09/2023 15:23   ECHOCARDIOGRAM COMPLETE Result Date: 06/08/2023    ECHOCARDIOGRAM REPORT   Patient Name:   Madison White Date of Exam: 06/08/2023 Medical Rec #:  [id number]       Height:       61.0 in Accession #:    [id number]      Weight:       150.0 lb Date of Birth:  31-May-1928       BSA:          1.671 m Patient Age:    89 years        BP:           152/89 mmHg Patient Gender: F               HR:           70 bpm. Exam Location:  Inpatient Procedure: 2D Echo, Cardiac Doppler and Color Doppler (Both Spectral and Color            Flow Doppler were utilized during procedure). Indications:    Stroke I63.9  History:        Patient has prior history of Echocardiogram examinations, most                  recent 12/05/2021. Non-ishcemic CMO; Risk Factors:Hypertension                 and Dyslipidemia.  Sonographer:    Harriette Bouillon RDCS Referring Phys: [phone] CLAUDIA CLAIBORNE IMPRESSIONS  1. Left ventricular ejection fraction, by estimation, is 60 to 65%. The left ventricle has normal function. The left ventricle has no regional wall motion abnormalities. There is moderate concentric left ventricular hypertrophy. Left ventricular diastolic parameters are indeterminate.  2. Right ventricular systolic function is normal. The right ventricular size is normal. Tricuspid regurgitation signal is inadequate for assessing PA pressure.  3. Left atrial size was severely dilated.  4. Right atrial size was mildly dilated.  5. The mitral valve is degenerative. Mild mitral valve regurgitation. No evidence of mitral stenosis. Moderate mitral annular calcification.  6. The aortic valve was not well visualized. There is mild calcification of the aortic valve. Aortic valve regurgitation is not visualized. Mild aortic valve stenosis. Aortic valve area, by VTI measures 1.38 cm. Aortic valve mean gradient measures 6.0 mmHg. Aortic valve Vmax measures 1.62 m/s.  7. The inferior vena cava is dilated in size with <50% respiratory variability, suggesting right atrial pressure of 15 mmHg. FINDINGS  Left Ventricle: Left ventricular ejection fraction, by estimation, is 60 to 65%. The left ventricle has normal function. The left ventricle has no regional wall motion abnormalities. The left ventricular internal cavity size was normal in size. There is  moderate concentric left ventricular hypertrophy. Left ventricular diastolic parameters are indeterminate. Right Ventricle: The right ventricular size is normal. No increase in right ventricular wall thickness. Right ventricular systolic function is normal. Tricuspid regurgitation signal is inadequate for assessing PA pressure. Left Atrium: Left atrial size was severely dilated. Right Atrium: Right  atrial size was mildly dilated. Pericardium: There is no evidence of pericardial effusion. Mitral Valve: The mitral valve is degenerative in appearance. Moderate mitral annular calcification. Mild mitral valve regurgitation. No evidence of mitral valve stenosis. Tricuspid Valve: The tricuspid valve is normal in structure. Tricuspid valve regurgitation is trivial. No evidence of tricuspid stenosis. Aortic Valve: The aortic valve was not well visualized. There is mild calcification of the aortic valve. Aortic valve regurgitation is not visualized. Mild aortic stenosis is present. Aortic valve mean gradient measures 6.0 mmHg. Aortic valve peak gradient measures 10.5 mmHg. Aortic valve area, by VTI measures 1.38 cm. Pulmonic Valve: The pulmonic valve was not well visualized. Pulmonic valve regurgitation is not visualized. No evidence of pulmonic stenosis. Aorta: The aortic root is normal in size and structure. Venous: The inferior vena cava is dilated in size with less than 50% respiratory variability, suggesting right atrial pressure of 15 mmHg. IAS/Shunts: No atrial level shunt detected by color flow Doppler. Additional Comments: A device lead is visualized.  LEFT VENTRICLE PLAX 2D LVIDd:         4.10 cm   Diastology LVIDs:         3.20 cm   LV e' medial:    8.38 cm/s LV PW:         1.30 cm   LV E/e' medial:  12.2 LV IVS:        1.30 cm   LV e' lateral:   6.42 cm/s LVOT diam:     2.00 cm   LV E/e' lateral: 15.9 LV SV:         43 LV SV Index:   26 LVOT Area:     3.14 cm  RIGHT VENTRICLE             IVC RV S prime:     11.10 cm/s  IVC diam: 2.30 cm TAPSE (M-mode): 1.4 cm LEFT ATRIUM  Index        RIGHT ATRIUM           Index LA diam:        4.30 cm 2.57 cm/m   RA Area:     11.50 cm LA Vol (A2C):   74.4 ml 44.51 ml/m  RA Volume:   24.00 ml  14.36 ml/m LA Vol (A4C):   53.1 ml 31.77 ml/m LA Biplane Vol: 64.9 ml 38.83 ml/m  AORTIC VALVE AV Area (Vmax):    1.56 cm AV Area (Vmean):   1.62 cm AV Area  (VTI):     1.38 cm AV Vmax:           162.00 cm/s AV Vmean:          109.000 cm/s AV VTI:            0.314 m AV Peak Grad:      10.5 mmHg AV Mean Grad:      6.0 mmHg LVOT Vmax:         80.70 cm/s LVOT Vmean:        56.100 cm/s LVOT VTI:          0.138 m LVOT/AV VTI ratio: 0.44  AORTA Ao Root diam: 2.60 cm Ao Asc diam:  3.10 cm MITRAL VALVE MV Area (PHT): 3.65 cm     SHUNTS MV Decel Time: 208 msec     Systemic VTI:  0.14 m MV E velocity: 102.00 cm/s  Systemic Diam: 2.00 cm MV A velocity: 32.30 cm/s MV E/A ratio:  3.16 Arvilla Meres MD Electronically signed by Arvilla Meres MD Signature Date/Time: 06/08/2023/3:16:23 PM    Final     Scheduled Meds:  amLODipine  5 mg Oral Daily   dabigatran  150 mg Oral Q12H   donepezil  10 mg Oral QHS   metoprolol tartrate  12.5 mg Oral BID   sodium chloride flush  3 mL Intravenous Once   Continuous Infusions:   LOS: 3 days   Hughie Closs, MD [facility]  06/10/2023, 11:51 AM   *Please note that this is a verbal dictation therefore any spelling or grammatical errors are due to the "Dragon Medical One" system interpretation.  Please page via Amion and do not message via secure chat for urgent patient care matters. Secure chat can be used for non urgent patient care matters.  How to contact the [facility] Attending or Consulting provider 7A - 7P or covering provider during after hours 7P -7A, for this patient?  Check the care team in [facility] and look for a) attending/consulting TRH provider listed and b) the [facility] team listed. Page or secure chat 7A-7P. Log into [email or web address] and use [place]'s universal password to access. If you do not have the password, please contact the hospital operator. Locate the [facility] provider you are looking for under [facility] and page to a number that you can be directly reached. If you still have difficulty reaching the provider, please page the [facility] (Director on Call) for the Hospitalists listed on amion for  assistance.

## 2023-06-10 NOTE — Progress Notes (Signed)
 Physical Therapy Treatment Patient Details Name: Madison White MRN: [id number] DOB: 12/02/1928 Today's Date: 06/10/2023   History of Present Illness Patient is a 89 y/o female admitted 06/07/23 with fall at home and noted L side weakness and facial droop.  CTH showed moderate size R CVA.  PMH positive for h/o DVT on Eliquis, NICM, PPM, HLD, HTN.    PT Comments  Pt seen for PT tx with daughter present in room. Pt pleasant throughout session, follows command well. Pt requires cuing re: hand placement during STS as pt initially attempts to transfer with BUE on RW. Pt ambulates around unit with RW & CGA, cuing x 1 to ambulate within base of AD vs slightly pushing it out in front of her. Pt performs 10x STS without BUE support with supervision with focus on BLE strengthening & endurance training. Pt is progressing well with mobility.    If plan is discharge home, recommend the following: A little help with walking and/or transfers;A little help with bathing/dressing/bathroom;Assistance with cooking/housework;Supervision due to cognitive status;Assist for transportation;Help with stairs or ramp for entrance   Can travel by private vehicle        Equipment Recommendations  None recommended by PT    Recommendations for Other Services Rehab consult     Precautions / Restrictions Precautions Precautions: Fall Precaution/Restrictions Comments: L inattention Restrictions Weight Bearing Restrictions Per Provider Order: No     Mobility  Bed Mobility               General bed mobility comments: not tested, pt received & left sitting in recliner    Transfers Overall transfer level: Needs assistance Equipment used: Rolling walker (2 wheels), None Transfers: Sit to/from Stand Sit to Stand: Supervision           General transfer comment: STS without assistance but cuing re: hand placement to push to standing vs BUE on RW    Ambulation/Gait Ambulation/Gait assistance: Contact  guard assist Gait Distance (Feet):  (>200 ft) Assistive device: Rolling walker (2 wheels) Gait Pattern/deviations: Step-through pattern Gait velocity: slightly decreased     General Gait Details: Cuing x 1 to ambulate within base of AD vs pushing it slightly out in front of her.   Stairs             Wheelchair Mobility     Tilt Bed    Modified Rankin (Stroke Patients Only)       Balance Overall balance assessment: Needs assistance Sitting-balance support: Feet supported, No upper extremity supported Sitting balance-Leahy Scale: Good     Standing balance support: During functional activity, Bilateral upper extremity supported, Reliant on assistive device for balance Standing balance-Leahy Scale: Fair                              Hotel manager: No apparent difficulties  Cognition Arousal: Alert Behavior During Therapy: WFL for tasks assessed/performed   PT - Cognitive impairments: Orientation, Attention, Memory, Safety/Judgement, Problem solving                       PT - Cognition Comments: Pt oriented to name, does not recall her current age (states 89 or 89 y/o), oriented to [facility] & "hospital" but unable to state which one, not oriented to month or year & unable to recall even after PT educates her. Following commands: Intact      Cueing Cueing Techniques: Verbal cues  Exercises Other Exercises Other Exercises: Pt performed 10x STS from recliner without BUE support & supervision with focus on BLE strengthening & endurance training.    General Comments        Pertinent Vitals/Pain Pain Assessment Pain Assessment: Faces Faces Pain Scale: Hurts a little bit Pain Location: back Pain Descriptors / Indicators: Discomfort Pain Intervention(s): Monitored during session    Home Living                          Prior Function            PT Goals (current goals can now be found in the care  plan section) Acute Rehab PT Goals Patient Stated Goal: for rehab PT Goal Formulation: With patient/family Time For Goal Achievement: 06/22/23 Potential to Achieve Goals: Good Progress towards PT goals: Progressing toward goals    Frequency    Min 2X/week      PT Plan      Co-evaluation              AM-PAC PT "6 Clicks" Mobility   Outcome Measure  Help needed turning from your back to your side while in a flat bed without using bedrails?: None Help needed moving from lying on your back to sitting on the side of a flat bed without using bedrails?: A Little Help needed moving to and from a bed to a chair (including a wheelchair)?: A Little Help needed standing up from a chair using your arms (e.g., wheelchair or bedside chair)?: A Little Help needed to walk in hospital room?: A Little Help needed climbing 3-5 steps with a railing? : A Little 6 Click Score: 19    End of Session   Activity Tolerance: Patient tolerated treatment well Patient left: in chair;with call bell/phone within reach;with family/visitor present Nurse Communication: Mobility status PT Visit Diagnosis: Other abnormalities of gait and mobility (R26.89);Other symptoms and signs involving the nervous system (R29.898);Hemiplegia and hemiparesis Hemiplegia - Right/Left: Left Hemiplegia - dominant/non-dominant: Non-dominant Hemiplegia - caused by: Cerebral infarction     Time: 8295-6213 PT Time Calculation (min) (ACUTE ONLY): 11 min  Charges:    $Therapeutic Activity: 8-22 mins PT General Charges $$ ACUTE PT VISIT: 1 Visit                     Aleda Grana, PT, DPT 06/10/23, 1:41 PM   Sandi Mariscal 06/10/2023, 1:40 PM

## 2023-06-10 NOTE — Addendum Note (Signed)
 Addended by: Geralyn Flash D on: 06/10/2023 04:14 PM   Modules accepted: Orders

## 2023-06-10 NOTE — Progress Notes (Signed)
 Remote pacemaker transmission.

## 2023-06-10 NOTE — Progress Notes (Signed)
 Inpatient Rehab Admissions Coordinator:   Stopped by to see pt's family to answer questions regarding rehab.  They were meeting with TOC and updated that they are clear on path forward for SNF and are in agreement.  I will sign off at this time.   Estill Dooms, PT, DPT Admissions Coordinator [phone] 06/10/23  11:58 AM

## 2023-06-11 DIAGNOSIS — R2689 Other abnormalities of gait and mobility: Secondary | ICD-10-CM | POA: Diagnosis not present

## 2023-06-11 DIAGNOSIS — R278 Other lack of coordination: Secondary | ICD-10-CM | POA: Diagnosis not present

## 2023-06-11 DIAGNOSIS — F0390 Unspecified dementia without behavioral disturbance: Secondary | ICD-10-CM | POA: Diagnosis not present

## 2023-06-11 DIAGNOSIS — I4819 Other persistent atrial fibrillation: Secondary | ICD-10-CM | POA: Diagnosis not present

## 2023-06-11 DIAGNOSIS — I10 Essential (primary) hypertension: Secondary | ICD-10-CM | POA: Diagnosis not present

## 2023-06-11 DIAGNOSIS — E785 Hyperlipidemia, unspecified: Secondary | ICD-10-CM | POA: Diagnosis not present

## 2023-06-11 DIAGNOSIS — I639 Cerebral infarction, unspecified: Secondary | ICD-10-CM | POA: Diagnosis not present

## 2023-06-11 DIAGNOSIS — I63411 Cerebral infarction due to embolism of right middle cerebral artery: Secondary | ICD-10-CM | POA: Diagnosis not present

## 2023-06-11 DIAGNOSIS — M6281 Muscle weakness (generalized): Secondary | ICD-10-CM | POA: Diagnosis not present

## 2023-06-11 DIAGNOSIS — R1310 Dysphagia, unspecified: Secondary | ICD-10-CM | POA: Diagnosis not present

## 2023-06-11 DIAGNOSIS — K5909 Other constipation: Secondary | ICD-10-CM | POA: Diagnosis not present

## 2023-06-11 DIAGNOSIS — I480 Paroxysmal atrial fibrillation: Secondary | ICD-10-CM | POA: Diagnosis not present

## 2023-06-11 DIAGNOSIS — R1312 Dysphagia, oropharyngeal phase: Secondary | ICD-10-CM | POA: Diagnosis not present

## 2023-06-11 DIAGNOSIS — R4182 Altered mental status, unspecified: Secondary | ICD-10-CM | POA: Diagnosis not present

## 2023-06-11 DIAGNOSIS — N3281 Overactive bladder: Secondary | ICD-10-CM | POA: Diagnosis not present

## 2023-06-11 DIAGNOSIS — Z7901 Long term (current) use of anticoagulants: Secondary | ICD-10-CM | POA: Diagnosis not present

## 2023-06-11 DIAGNOSIS — F0150 Vascular dementia without behavioral disturbance: Secondary | ICD-10-CM | POA: Diagnosis not present

## 2023-06-11 DIAGNOSIS — K219 Gastro-esophageal reflux disease without esophagitis: Secondary | ICD-10-CM | POA: Diagnosis not present

## 2023-06-11 DIAGNOSIS — I442 Atrioventricular block, complete: Secondary | ICD-10-CM | POA: Diagnosis not present

## 2023-06-11 DIAGNOSIS — Z86718 Personal history of other venous thrombosis and embolism: Secondary | ICD-10-CM | POA: Diagnosis not present

## 2023-06-11 MED ORDER — AMLODIPINE BESYLATE 5 MG PO TABS
5.0000 mg | ORAL_TABLET | Freq: Every day | ORAL | 0 refills | Status: DC
Start: 2023-06-12 — End: 2023-07-23

## 2023-06-11 MED ORDER — DABIGATRAN ETEXILATE MESYLATE 150 MG PO CAPS: 150.0000 mg | ORAL_CAPSULE | Freq: Two times a day (BID) | ORAL | 0 refills | Status: DC

## 2023-06-11 NOTE — TOC Transition Note (Signed)
 Transition of Care [facility]) - Discharge Note   Patient Details  Name: Madison White MRN: [id number] Date of Birth: Aug 07, 1928  [facility]) CM/SW Contact:  Dodie Parisi Felipa Emory, Student-Social Work Phone Number: 06/11/2023, 11:06 AM   Clinical Narrative:   MSW Student received insurance approval for patient to admit to [facility]. MSW Student confirmed with MD that patient stable for discharge. MSW Student notified daughter Olegario Messier and they are in agreement with discharge. MSW Student confirmed bed is available at SNF. Transport arranged with daughter Olegario Messier, she will be taking patient to SNF.   Number to call report: [phone] RM: [phone]    Final next level of care: Skilled Nursing Facility Barriers to Discharge: No Barriers Identified   Patient Goals and CMS Choice Patient states their goals for this hospitalization and ongoing recovery are:: Unable to assess not fully oriented CMS Medicare.gov Compare Post Acute Care list provided to:: Patient Represenative (must comment) Choice offered to / list presented to : Adult Children [place] ownership interest in [facility]provided to:: Adult Children    Discharge Placement              Patient chooses bed at: [facility] Patient to be transferred to facility by: Daughter Olegario Messier Name of family member notified: Olegario Messier Patient and family notified of of transfer: 06/11/23  Discharge Plan and Services Additional resources added to the After Visit Summary for     Discharge Planning Services: CM Consult Post Acute Care Choice: IP Rehab                               Social Drivers of Health (SDOH) Interventions SDOH Screenings   Food Insecurity: No Food Insecurity (06/07/2023)  Housing: Low Risk  (06/07/2023)  Transportation Needs: No Transportation Needs (06/07/2023)  Utilities: Not At Risk (06/07/2023)  Depression (PHQ2-9): Low Risk  (02/09/2023)  Financial Resource Strain: Low Risk  (02/09/2023)   Physical Activity: Inactive (02/09/2023)  Social Connections: Socially Isolated (06/07/2023)  Stress: No Stress Concern Present (02/09/2023)  Tobacco Use: Medium Risk (06/07/2023)  Health Literacy: Adequate Health Literacy (02/09/2023)     Readmission Risk Interventions     No data to display

## 2023-06-11 NOTE — Progress Notes (Signed)
 Speech Language Pathology Treatment: Cognitive-Linquistic  Patient Details Name: Madison White MRN: [id number] DOB: 1928-07-09 Today's Date: 06/11/2023 Time: 6578-4696 SLP Time Calculation (min) (ACUTE ONLY): 25 min  Assessment / Plan / Recommendation Clinical Impression  Pt seen for cognitive tx f/u focusing on memory recall, orientation and sustained attention with functional directives.  Daughter in attendance during session and provided pmhx re: cognition.  Pt with baseline dementia (mod per daughter's report) with exacerbation suspected post CVA.  Pt with decreased orientation, but improved per daughter's report as she was able to indicate place and self, but not time nor situation (baseline function per report).  Pt recalled familiar names/interests and living situation prior, but new information unable to be recalled during functional tasks.  Decreased processing/recall observed throughout session with previously known information (ie: children's names), but with min A, pt did eventually recall information within session, intermittently seeking A from daughter for clarification.  Repetition/sentence completion A with recall of familiar information.  Decreased awareness and problem solving noted, although pt was able to indicate how to call for A during the session and this was new per daughter's report.  Recommend ST f/u at next venue of care for establishing baseline cognition as able.    HPI HPI: Patient is a 89 y/o female admitted 06/07/23 with fall at home and noted L side weakness and facial droop. CTH showed moderate size R CVA. PMH positive for h/o DVT on Eliquis, NICM, PPM, HLD, HTN. ST f/u for cognitive reorganization/tx.      SLP Plan  Discharge SLP treatment due to (transfer to rehab)      Recommendations for follow up therapy are one component of a multi-disciplinary discharge planning process, led by the attending physician.  Recommendations may be updated based on patient  status, additional functional criteria and insurance authorization.    Recommendations   TBD                      Frequent or constant Supervision/Assistance Cognitive communication deficit (E95.284)     Discharge SLP treatment due to (comment)     Madison White,M.S., CCC-SLP  06/11/2023, 11:16 AM

## 2023-06-11 NOTE — Discharge Summary (Signed)
 Physician Discharge Summary  Madison White JXB:[id number] DOB: May 31, 1928 DOA: 06/07/2023  PCP: Willow Ora, MD  Admit date: 06/07/2023 Discharge date: 06/11/2023 30 Day Unplanned Readmission Risk Score    Flowsheet Row ED to Hosp-Admission (Current) from 06/07/2023 in [facility]  30 Day Unplanned Readmission Risk Score (%) 9.2 Filed at 06/11/2023 0801       This score is the patient's risk of an unplanned readmission within 30 days of being discharged (0 -100%). The score is based on dignosis, age, lab data, medications, orders, and past utilization.   Low:  0-14.9   Medium: 15-21.9   High: 22-29.9   Extreme: 30 and above          Admitted From: Home Disposition: SNF  Recommendations for Outpatient Follow-up:  Follow up with PCP in 1-2 weeks Please obtain BMP/CBC in one week Follow up with neurology in 4 weeks Please follow up with your PCP on the following pending results: Unresulted Labs (From admission, onward)    None         Home Health: None Equipment/Devices: None  Discharge Condition: Stable CODE STATUS: Full code Diet recommendation: Cardiac  Subjective: Seen and examined.  Sleepy this morning.  Per daughter who was at the bedside, patient had slightly rough night so she is sleeping longer today.  There were no concerns raised by the family.  Brief/Interim Summary: Madison White is a 89 y.o. female with medical history significant history of DVT on Eliquis, nonischemic cardiomyopathy, pacemaker, hyperlipidemia and hypertension was brought in the hospital due to fall, followed by weakness and facial droop.  She also was not holding her walker properly instead of having her hand around the walker she just had her fist sitting on top of the walker.  The patient was also confused and talking about a chair that was not there.   In the emergency department a code stroke was called.  Patient was out of the window for tPA.  She was admitted  under hospital service.  Details below.   Acute/subacute CVA Etiology appears to be cardioembolic and imaging shows an acute/subacute infarct in the right parietal lobe with loss of gray-white differentiation at both the ganglionic and super ganglionic levels of the right parietal lobe. CT angiogram of the head and neck does not show any LVO 2D echocardiogram shows an LVEF of 60 to 65% with moderate concentric LVH. MRI showed right MCA middle and posterior division infarcts with confluent petechial hemorrhage in the right parietal area of involvement which was most apparent by CT. No intracranial mass effect. Underlying chronic small and medium-sized vessel ischemia, scattered chronic microhemorrhages not rising to the level of amyloid angiopathy. Discussed with neurologist who recommends to resume anticoagulation.   Recommends Pradaxa .Continue aspirin 81 mg.  PT OT recommends CIR, however CIR MD did not think she will meet the criteria, they recommended pursuing SNF which has been arranged for her and patient is going to be discharged to SNF in stable condition today.  Please note that patient's Eliquis is being transitioned to Pradaxa and I personally spoke to neurologist Dr. Pearlean Brownie and he did not recommend any aspirin or Plavix along with Pradaxa.   Essential hypertension: Patient's blood pressure has remained elevated, we started her on amlodipine 5 mg yesterday, blood pressure improving slowly.   History of persistent A-fib Patient was on Eliquis and now on Pradaxa Continue metoprolol for rate control   Dementia  Continue Aricept  Discharge plan  was discussed with patient and/or family member and they verbalized understanding and agreed with it.  Discharge Diagnoses:  Principal Problem:   Stroke [facility]) Active Problems:   Left bundle branch block   Constipation, chronic   AMS (altered mental status)    Discharge Instructions   Allergies as of 06/11/2023       Reactions    Penicillins Hives, Swelling   Nickel Swelling, Rash   Avapro [irbesartan] Other (See Comments)   Unknown rxn   Lisinopril Swelling   Swelling around eyes; also says it caused increased sugar and BP   Simvastatin Other (See Comments)   unknown   Latex Rash   Sulfa Antibiotics Rash   Sulfamethoxazole Rash        Medication List     STOP taking these medications    Eliquis 5 MG Tabs tablet Generic drug: apixaban       TAKE these medications    amLODipine 5 MG tablet Commonly known as: NORVASC Take 1 tablet (5 mg total) by mouth daily. Start taking on: June 12, 2023   calcium-vitamin D 500-200 MG-UNIT tablet Commonly known as: OSCAL WITH D Take 1 tablet by mouth.   Cholecalciferol 25 MCG (1000 UT) tablet Take 1,000 Units by mouth at bedtime.   cyanocobalamin 100 MCG tablet Take 100 mcg by mouth daily.   dabigatran 150 MG Caps capsule Commonly known as: PRADAXA Take 1 capsule (150 mg total) by mouth every 12 (twelve) hours.   donepezil 10 MG tablet Commonly known as: ARICEPT TAKE 1 TABLET BY MOUTH EVERYDAY AT BEDTIME   ELDERBERRY PO Take 1 tablet by mouth daily in the afternoon. Chew Gummie   Iron-Vitamin C 100-250 MG Tabs Take 1 tablet by mouth at bedtime.   metoprolol tartrate 25 MG tablet Commonly known as: LOPRESSOR TAKE 1 TABLET BY MOUTH TWICE A DAY What changed: how much to take   Myrbetriq 25 MG Tb24 tablet Generic drug: mirabegron ER TAKE 1 TABLET (25 MG TOTAL) BY MOUTH DAILY. What changed: when to take this   omeprazole 10 MG capsule Commonly known as: PRILOSEC Take 10 mg by mouth daily.   PEDIATRIC MULTIVITAMINS-IRON PO Take 2 tablets by mouth daily in the afternoon. Chew gummie        Follow-up Information     Willow Ora, MD Follow up in 1 week(s).   Specialty: Family Medicine Contact information: [address] [place] [address] [phone]                Allergies  Allergen Reactions    Penicillins Hives and Swelling   Nickel Swelling and Rash   Avapro [Irbesartan] Other (See Comments)    Unknown rxn   Lisinopril Swelling    Swelling around eyes; also says it caused increased sugar and BP   Simvastatin Other (See Comments)    unknown   Latex Rash   Sulfa Antibiotics Rash   Sulfamethoxazole Rash    Consultations: Neurology   Procedures/Studies: MR BRAIN WO CONTRAST Result Date: 06/09/2023 CLINICAL DATA:  89 year old female code stroke presentation on 06/07/2023 with right hemisphere cytotoxic edema on CT at that time. No large vessel occlusion by CTA. On home Eliquis. No intravenous thrombolysis administered. EXAM: MRI HEAD WITHOUT CONTRAST TECHNIQUE: Multiplanar, multiecho pulse sequences of the brain and surrounding structures were obtained without intravenous contrast. COMPARISON:  Head CT 06/07/2023.  Brain MRI 12/02/2014. FINDINGS: Brain: Right parietal hypodense area by CT demonstrates heterogeneous diffusion which is cortically restricted, confluent associated  T2 and FLAIR hyperintensity, and underlying confluent petechial hemorrhage on SWI (series 7, image 68). No significant regional mass effect. Additional mostly cortically based restricted diffusion tracking from the posterior insula through the posterior operculum without blood products on SWI (series 3, image 19). No contralateral left hemisphere or posterior fossa restricted diffusion. Scattered chronic microhemorrhages in both cerebral hemispheres, right thalamus, left cerebellum. This is not rise to the level of amyloid angiopathy. Scattered smaller areas of chronic cortical encephalomalacia in the posterior left MCA territory. Superimposed relatively mild for age patchy periventricular and deep white matter capsule T2 and FLAIR hyperintensity. Chronic lacunar infarcts in the deep gray nuclei more so the right. Vascular: Major intracranial vascular flow voids are preserved. Skull and upper cervical spine: Visualized  bone marrow signal is within normal limits. Normal for age visible cervical spine. Sinuses/Orbits: Leftward gaze.  Otherwise negative. Other: Paranasal sinuses and mastoids are clear. IMPRESSION: 1. Right MCA middle and posterior division infarcts with confluent petechial hemorrhage in the right parietal area of involvement which was most apparent by CT. No intracranial mass effect. 2. Underlying chronic small and medium-sized vessel ischemia, scattered chronic microhemorrhages not rising to the level of amyloid angiopathy. Electronically Signed   By: Odessa Fleming M.D.   On: 06/09/2023 15:23   ECHOCARDIOGRAM COMPLETE Result Date: 06/08/2023    ECHOCARDIOGRAM REPORT   Patient Name:   RAYLENE CARMICKLE Date of Exam: 06/08/2023 Medical Rec #:  [id number]       Height:       61.0 in Accession #:    [id number]      Weight:       150.0 lb Date of Birth:  1929/03/06       BSA:          1.671 m Patient Age:    89 years        BP:           152/89 mmHg Patient Gender: F               HR:           70 bpm. Exam Location:  Inpatient Procedure: 2D Echo, Cardiac Doppler and Color Doppler (Both Spectral and Color            Flow Doppler were utilized during procedure). Indications:    Stroke I63.9  History:        Patient has prior history of Echocardiogram examinations, most                 recent 12/05/2021. Non-ishcemic CMO; Risk Factors:Hypertension                 and Dyslipidemia.  Sonographer:    Harriette Bouillon RDCS Referring Phys: [phone] CLAUDIA CLAIBORNE IMPRESSIONS  1. Left ventricular ejection fraction, by estimation, is 60 to 65%. The left ventricle has normal function. The left ventricle has no regional wall motion abnormalities. There is moderate concentric left ventricular hypertrophy. Left ventricular diastolic parameters are indeterminate.  2. Right ventricular systolic function is normal. The right ventricular size is normal. Tricuspid regurgitation signal is inadequate for assessing PA pressure.  3. Left atrial size was  severely dilated.  4. Right atrial size was mildly dilated.  5. The mitral valve is degenerative. Mild mitral valve regurgitation. No evidence of mitral stenosis. Moderate mitral annular calcification.  6. The aortic valve was not well visualized. There is mild calcification of the aortic valve. Aortic valve regurgitation is not visualized. Mild aortic valve  stenosis. Aortic valve area, by VTI measures 1.38 cm. Aortic valve mean gradient measures 6.0 mmHg. Aortic valve Vmax measures 1.62 m/s.  7. The inferior vena cava is dilated in size with <50% respiratory variability, suggesting right atrial pressure of 15 mmHg. FINDINGS  Left Ventricle: Left ventricular ejection fraction, by estimation, is 60 to 65%. The left ventricle has normal function. The left ventricle has no regional wall motion abnormalities. The left ventricular internal cavity size was normal in size. There is  moderate concentric left ventricular hypertrophy. Left ventricular diastolic parameters are indeterminate. Right Ventricle: The right ventricular size is normal. No increase in right ventricular wall thickness. Right ventricular systolic function is normal. Tricuspid regurgitation signal is inadequate for assessing PA pressure. Left Atrium: Left atrial size was severely dilated. Right Atrium: Right atrial size was mildly dilated. Pericardium: There is no evidence of pericardial effusion. Mitral Valve: The mitral valve is degenerative in appearance. Moderate mitral annular calcification. Mild mitral valve regurgitation. No evidence of mitral valve stenosis. Tricuspid Valve: The tricuspid valve is normal in structure. Tricuspid valve regurgitation is trivial. No evidence of tricuspid stenosis. Aortic Valve: The aortic valve was not well visualized. There is mild calcification of the aortic valve. Aortic valve regurgitation is not visualized. Mild aortic stenosis is present. Aortic valve mean gradient measures 6.0 mmHg. Aortic valve peak gradient  measures 10.5 mmHg. Aortic valve area, by VTI measures 1.38 cm. Pulmonic Valve: The pulmonic valve was not well visualized. Pulmonic valve regurgitation is not visualized. No evidence of pulmonic stenosis. Aorta: The aortic root is normal in size and structure. Venous: The inferior vena cava is dilated in size with less than 50% respiratory variability, suggesting right atrial pressure of 15 mmHg. IAS/Shunts: No atrial level shunt detected by color flow Doppler. Additional Comments: A device lead is visualized.  LEFT VENTRICLE PLAX 2D LVIDd:         4.10 cm   Diastology LVIDs:         3.20 cm   LV e' medial:    8.38 cm/s LV PW:         1.30 cm   LV E/e' medial:  12.2 LV IVS:        1.30 cm   LV e' lateral:   6.42 cm/s LVOT diam:     2.00 cm   LV E/e' lateral: 15.9 LV SV:         43 LV SV Index:   26 LVOT Area:     3.14 cm  RIGHT VENTRICLE             IVC RV S prime:     11.10 cm/s  IVC diam: 2.30 cm TAPSE (M-mode): 1.4 cm LEFT ATRIUM             Index        RIGHT ATRIUM           Index LA diam:        4.30 cm 2.57 cm/m   RA Area:     11.50 cm LA Vol (A2C):   74.4 ml 44.51 ml/m  RA Volume:   24.00 ml  14.36 ml/m LA Vol (A4C):   53.1 ml 31.77 ml/m LA Biplane Vol: 64.9 ml 38.83 ml/m  AORTIC VALVE AV Area (Vmax):    1.56 cm AV Area (Vmean):   1.62 cm AV Area (VTI):     1.38 cm AV Vmax:           162.00 cm/s AV Vmean:  109.000 cm/s AV VTI:            0.314 m AV Peak Grad:      10.5 mmHg AV Mean Grad:      6.0 mmHg LVOT Vmax:         80.70 cm/s LVOT Vmean:        56.100 cm/s LVOT VTI:          0.138 m LVOT/AV VTI ratio: 0.44  AORTA Ao Root diam: 2.60 cm Ao Asc diam:  3.10 cm MITRAL VALVE MV Area (PHT): 3.65 cm     SHUNTS MV Decel Time: 208 msec     Systemic VTI:  0.14 m MV E velocity: 102.00 cm/s  Systemic Diam: 2.00 cm MV A velocity: 32.30 cm/s MV E/A ratio:  3.16 Arvilla Meres MD Electronically signed by Arvilla Meres MD Signature Date/Time: 06/08/2023/3:16:23 PM    Final    DG Chest Portable  1 View Result Date: 06/07/2023 CLINICAL DATA:  89 year old female with fall, left side weakness. EXAM: PORTABLE CHEST 1 VIEW COMPARISON:  Chest radiographs 03/11/2021 and earlier. FINDINGS: Portable AP view at 1229 hours. Chronic left chest cardiac pacemaker. Mediastinal contours are stable and within normal limits. Lung volumes are stable and within normal limits. Allowing for portable technique the lungs are clear. Visualized tracheal air column is within normal limits. Chronic ORIF proximal right humerus. No acute osseous abnormality identified. Paucity of bowel gas in the visible abdomen. IMPRESSION: No acute cardiopulmonary abnormality or acute traumatic injury identified. Electronically Signed   By: Odessa Fleming M.D.   On: 06/07/2023 12:57   CT ANGIO HEAD NECK W WO CM W PERF (CODE STROKE) Result Date: 06/07/2023 CLINICAL DATA:  Code stroke.  Left-sided weakness. EXAM: CT ANGIOGRAPHY HEAD AND NECK TECHNIQUE: Multidetector CT imaging of the head and neck was performed using the standard protocol during bolus administration of intravenous contrast. Multiplanar CT image reconstructions and MIPs were obtained to evaluate the vascular anatomy. Carotid stenosis measurements (when applicable) are obtained utilizing NASCET criteria, using the distal internal carotid diameter as the denominator. RADIATION DOSE REDUCTION: This exam was performed according to the departmental dose-optimization program which includes automated exposure control, adjustment of the mA and/or kV according to patient size and/or use of iterative reconstruction technique. CONTRAST:  OMNIPAQUE IOHEXOL 350 MG/ML SOLN COMPARISON:  CT head without contrast 06/07/2023. CT head without contrast 11/21/2017. FINDINGS: CTA NECK FINDINGS Aortic arch: Atherosclerotic calcifications are present at the aortic arch and great vessel origins. No significant stenosis is present at the origins the great vessels. No aneurysm or dissection is present. Right  carotid system: Right common carotid artery is within normal limits. Calcifications are present at the right carotid bifurcation without significant stenosis. The cervical right ICA is otherwise normal. Left carotid system: The left common carotid artery is within normal limits. Atherosclerotic calcifications are present at the bifurcation. Mild tortuosity is present in the mid cervical left ICA. No focal stenosis is present. Vertebral arteries: The right vertebral artery is dominant. Both vertebral arteries originate from the subclavian arteries without significant stenoses. No significant stenosis is present in either vertebral artery in the neck. Skeleton: Mild degenerative changes are present cervical spine, most evident at C3-4 and C5-6. No focal osseous lesions are present. Other neck: The soft tissues of the neck are otherwise unremarkable. Salivary glands are within normal limits. Thyroid is normal. No significant adenopathy is present. No focal mucosal or submucosal lesions are present. Upper chest: The lung apices are clear.  The thoracic inlet is within normal limits. Review of the MIP images confirms the above findings CTA HEAD FINDINGS Anterior circulation: Atherosclerotic calcifications are present within the cavernous internal carotid arteries bilaterally without significant stenosis through the ICA termini. The A1 and M1 segments are normal. The anterior communicating artery is patent. The MCA bifurcations are within normal limits bilaterally. Moderate stenosis is present in the proximal inferior right M2 segment. Asymmetric attenuation of posterior right MCA branches is noted. The more distal anterior superior right M3 stenosis is present. No branch vessel occlusion is present. The left MCA branch vessels and bilateral ACA branch vessels are normal. Posterior circulation: PICA origins are visualized and normal bilaterally. The vertebrobasilar junction is normal. Moderate proximal basilar artery  stenosis is present. Basilar artery is small, centrally terminating at the superior cerebellar arteries. Fetal type posterior cerebral arteries are present bilaterally. Moderate stenoses are present in the left P1 and left P2 segment. More mild right P2 segment narrowing is present. No vessel occlusion is present. No aneurysm is present. Venous sinuses: The dural sinuses are patent. The straight sinus and deep cerebral veins are intact. Cortical veins are within normal limits. No significant vascular malformation is evident. Anatomic variants: Fetal type posterior cerebral arteries bilaterally. Review of the MIP images confirms the above findings IMPRESSION: 1. No large vessel occlusion. 2. Moderate stenosis of the proximal inferior right M2 segment. 3. More distal anterior superior right M3 stenosis. 4. Moderate proximal basilar artery stenosis. 5. Moderate stenoses in the left P1 and left P2 segment. 6. More mild right P2 segment narrowing. 7. Atherosclerotic changes at the carotid bifurcations and cavernous internal carotid arteries bilaterally without significant stenosis. 8. Mild degenerative changes of the cervical spine, most evident at C3-4 and C5-6. 9.  Aortic Atherosclerosis (ICD10-I70.0). The above was relayed via text pager to Dr. Ritta Slot on 06/07/2023 at 12:17 . Electronically Signed   By: Marin Roberts M.D.   On: 06/07/2023 12:26   CT HEAD CODE STROKE WO CONTRAST Result Date: 06/07/2023 CLINICAL DATA:  Code stroke. Neuro deficit, acute, stroke suspected. Last known normal less than 1 hour ago. Left-sided weakness. EXAM: CT HEAD WITHOUT CONTRAST TECHNIQUE: Contiguous axial images were obtained from the base of the skull through the vertex without intravenous contrast. RADIATION DOSE REDUCTION: This exam was performed according to the departmental dose-optimization program which includes automated exposure control, adjustment of the mA and/or kV according to patient size and/or use  of iterative reconstruction technique. COMPARISON:  MR head without contrast 12/02/2014 FINDINGS: Brain: Acute/subacute infarct is present in the right parietal lobe. Loss of gray-white differentiation present at both the ganglionic and super ganglionic levels of the right parietal lobe. Insular ribbon is intact. Basal ganglia are within normal limits. Moderate generalized atrophy and white matter disease is present otherwise. The ventricles are proportionate to the degree of atrophy. No significant extraaxial fluid collection is present. Vascular: Atherosclerotic calcifications are present at the cavernous internal carotid arteries bilaterally. No hyperdense vessel is present. Skull: Calvarium is intact. No focal lytic or blastic lesions are present. No significant extracranial soft tissue lesion is present. Sinuses/Orbits: The paranasal sinuses and mastoid air cells are clear. Bilateral lens replacements are noted. Globes and orbits are otherwise unremarkable. ASPECTS [facility] Stroke Program Early CT Score) - Ganglionic level infarction (caudate, lentiform nuclei, internal capsule, insula, M1-M3 cortex): 6/7 - Supraganglionic infarction (M4-M6 cortex): 2/3 Total score (0-10 with 10 being normal): 8/10 IMPRESSION: 1. Acute/subacute infarct in the right parietal lobe with loss  of gray-white differentiation at both the ganglionic and super ganglionic levels of the right parietal lobe. 2. Aspects is 8/10. 3. Moderate generalized atrophy and white matter disease likely reflects the sequela of chronic microvascular ischemia. The above was relayed via text pager to Dr. Amada Jupiter on 06/07/2023 at 12:07 . Electronically Signed   By: Marin Roberts M.D.   On: 06/07/2023 12:11     Discharge Exam: Vitals:   06/11/23 0426 06/11/23 0733  BP: (!) 155/80 (!) 176/69  Pulse: 70 70  Resp: 16 17  Temp: 98 F (36.7 C) 98.6 F (37 C)  SpO2: 97% 96%   Vitals:   06/10/23 2102 06/11/23 0017 06/11/23 0426 06/11/23  0733  BP: (!) 161/65 (!) 161/71 (!) 155/80 (!) 176/69  Pulse: 69 70 70 70  Resp:  14 16 17   Temp:  98.7 F (37.1 C) 98 F (36.7 C) 98.6 F (37 C)  TempSrc:  Axillary Oral Oral  SpO2:  94% 97% 96%  Weight:      Height:        General: Pt is alert, awake, not in acute distress Cardiovascular: RRR, S1/S2 +, no rubs, no gallops Respiratory: CTA bilaterally, no wheezing, no rhonchi Abdominal: Soft, NT, ND, bowel sounds + Extremities: no edema, no cyanosis    The results of significant diagnostics from this hospitalization (including imaging, microbiology, ancillary and laboratory) are listed below for reference.     Microbiology: No results found for this or any previous visit (from the past 240 hours).   Labs: BNP (last 3 results) No results for input(s): "BNP" in the last 8760 hours. Basic Metabolic Panel: Recent Labs  Lab 06/07/23 1202 06/10/23 1511  NA 141 139  K 3.7 3.6  CL 105 103  CO2 24 27  GLUCOSE 153* 141*  BUN 12 9  CREATININE 0.74 0.75  CALCIUM 9.2 8.9   Liver Function Tests: Recent Labs  Lab 06/07/23 1202  AST 20  ALT 11  ALKPHOS 41  BILITOT 0.4  PROT 6.7  ALBUMIN 3.9   No results for input(s): "LIPASE", "AMYLASE" in the last 168 hours. No results for input(s): "AMMONIA" in the last 168 hours. CBC: Recent Labs  Lab 06/07/23 1202 06/10/23 1511  WBC 14.4* 7.1  NEUTROABS 12.6* 5.1  HGB 12.8 12.7  HCT 38.4 38.1  MCV 88.1 90.1  PLT 331 260   Cardiac Enzymes: No results for input(s): "CKTOTAL", "CKMB", "CKMBINDEX", "TROPONINI" in the last 168 hours. BNP: Invalid input(s): "POCBNP" CBG: Recent Labs  Lab 06/07/23 1156  GLUCAP 151*   D-Dimer No results for input(s): "DDIMER" in the last 72 hours. Hgb A1c No results for input(s): "HGBA1C" in the last 72 hours. Lipid Profile No results for input(s): "CHOL", "HDL", "LDLCALC", "TRIG", "CHOLHDL", "LDLDIRECT" in the last 72 hours. Thyroid function studies No results for input(s): "TSH",  "T4TOTAL", "T3FREE", "THYROIDAB" in the last 72 hours.  Invalid input(s): "FREET3" Anemia work up No results for input(s): "VITAMINB12", "FOLATE", "FERRITIN", "TIBC", "IRON", "RETICCTPCT" in the last 72 hours. Urinalysis    Component Value Date/Time   COLORURINE YELLOW 11/04/2022 1809   APPEARANCEUR CLEAR 11/04/2022 1809   LABSPEC 1.024 11/04/2022 1809   PHURINE 5.0 11/04/2022 1809   GLUCOSEU NEGATIVE 11/04/2022 1809   GLUCOSEU NEGATIVE 06/19/2022 1200   HGBUR NEGATIVE 11/04/2022 1809   BILIRUBINUR NEGATIVE 11/04/2022 1809   BILIRUBINUR neg 10/04/2020 1158   KETONESUR NEGATIVE 11/04/2022 1809   PROTEINUR TRACE (A) 11/04/2022 1809   UROBILINOGEN 0.2 06/19/2022 1200  NITRITE NEGATIVE 11/04/2022 1809   LEUKOCYTESUR NEGATIVE 11/04/2022 1809   Sepsis Labs Recent Labs  Lab 06/07/23 1202 06/10/23 1511  WBC 14.4* 7.1   Microbiology No results found for this or any previous visit (from the past 240 hours).  FURTHER DISCHARGE INSTRUCTIONS:   Get Medicines reviewed and adjusted: Please take all your medications with you for your next visit with your Primary MD   Laboratory/radiological data: Please request your Primary MD to go over all hospital tests and procedure/radiological results at the follow up, please ask your Primary MD to get all Hospital records sent to his/her office.   In some cases, they will be blood work, cultures and biopsy results pending at the time of your discharge. Please request that your primary care M.D. goes through all the records of your hospital data and follows up on these results.   Also Note the following: If you experience worsening of your admission symptoms, develop shortness of breath, life threatening emergency, suicidal or homicidal thoughts you must seek medical attention immediately by calling 911 or calling your MD immediately  if symptoms less severe.   You must read complete instructions/literature along with all the possible adverse  reactions/side effects for all the Medicines you take and that have been prescribed to you. Take any new Medicines after you have completely understood and accpet all the possible adverse reactions/side effects.    Do not drive when taking Pain medications or sleeping medications (Benzodaizepines)   Do not take more than prescribed Pain, Sleep and Anxiety Medications. It is not advisable to combine anxiety,sleep and pain medications without talking with your primary care practitioner   Special Instructions: If you have smoked or chewed Tobacco  in the last 2 yrs please stop smoking, stop any regular Alcohol  and or any Recreational drug use.   Wear Seat belts while driving.   Please note: You were cared for by a hospitalist during your hospital stay. Once you are discharged, your primary care physician will handle any further medical issues. Please note that NO REFILLS for any discharge medications will be authorized once you are discharged, as it is imperative that you return to your primary care physician (or establish a relationship with a primary care physician if you do not have one) for your post hospital discharge needs so that they can reassess your need for medications and monitor your lab values  Time coordinating discharge: Over 30 minutes  SIGNED:   Hughie Closs, MD  [facility] 06/11/2023, 10:27 AM *Please note that this is a verbal dictation therefore any spelling or grammatical errors are due to the "Dragon Medical One" system interpretation. If 7PM-7AM, please contact night-coverage [email or web address]

## 2023-06-15 ENCOUNTER — Other Ambulatory Visit: Payer: Self-pay | Admitting: *Deleted

## 2023-06-15 NOTE — Patient Outreach (Signed)
 Madison White resides in [facility]. Screening for potential complex care management services as benefit of health plan and primary care provider.  Collaboration with Deseree, SNF Child psychotherapist. Madison White's family is interested in long term placement. Deseree reports [facility] to assist family with Medicaid pending placement.   Will continue to follow.   Raiford Noble, MSN, RN, BSN [place]  [facility], [facility] Commun[facility] RN Post- Acute Care Manager Direct Dial: [phone]

## 2023-06-16 DIAGNOSIS — I480 Paroxysmal atrial fibrillation: Secondary | ICD-10-CM | POA: Diagnosis not present

## 2023-06-16 DIAGNOSIS — R1312 Dysphagia, oropharyngeal phase: Secondary | ICD-10-CM | POA: Diagnosis not present

## 2023-06-16 DIAGNOSIS — E785 Hyperlipidemia, unspecified: Secondary | ICD-10-CM | POA: Diagnosis not present

## 2023-06-16 DIAGNOSIS — F0150 Vascular dementia without behavioral disturbance: Secondary | ICD-10-CM | POA: Diagnosis not present

## 2023-06-18 DIAGNOSIS — I10 Essential (primary) hypertension: Secondary | ICD-10-CM | POA: Diagnosis not present

## 2023-06-18 DIAGNOSIS — F0390 Unspecified dementia without behavioral disturbance: Secondary | ICD-10-CM | POA: Diagnosis not present

## 2023-06-18 DIAGNOSIS — F0150 Vascular dementia without behavioral disturbance: Secondary | ICD-10-CM | POA: Diagnosis not present

## 2023-06-18 DIAGNOSIS — I639 Cerebral infarction, unspecified: Secondary | ICD-10-CM | POA: Diagnosis not present

## 2023-06-18 DIAGNOSIS — I442 Atrioventricular block, complete: Secondary | ICD-10-CM | POA: Diagnosis not present

## 2023-06-18 DIAGNOSIS — K5909 Other constipation: Secondary | ICD-10-CM | POA: Diagnosis not present

## 2023-06-18 DIAGNOSIS — I4819 Other persistent atrial fibrillation: Secondary | ICD-10-CM | POA: Diagnosis not present

## 2023-06-26 ENCOUNTER — Telehealth: Payer: Self-pay

## 2023-06-26 DIAGNOSIS — I10 Essential (primary) hypertension: Secondary | ICD-10-CM

## 2023-06-26 NOTE — Patient Outreach (Signed)
 Submitted Emmi stroke referral.  Vanice Sarah [facility] Health Care Management Assistant  Direct Dial: [phone]  Fax: [phone] Website: [email or web address]

## 2023-06-29 ENCOUNTER — Telehealth: Payer: Self-pay

## 2023-06-29 NOTE — Transitions of Care (Post Inpatient/ED Visit) (Signed)
  [facility]) Care Management Emmi Follow up Red Flag  Note   06/29/2023 Name:  Madison White MRN:  [id number] DOB:  05-08-1928  Summary:  Patient discharged to SNF.   Emmi Red flags Not applicable.   Orpha Blade, RN, BSN, CEN [employer]- Transition of Care Team.  [facility] [phone]

## 2023-07-14 ENCOUNTER — Other Ambulatory Visit: Payer: Self-pay | Admitting: *Deleted

## 2023-07-14 NOTE — Patient Outreach (Signed)
 Post- Acute Care Manager follow up. Mrs. Depaepe resides in [facility].  Screening for potential complex care management services as benefit of health plan and primary care provider.  Collaboration with Deseree, Geophysicist/field seismologist. Mrs. Moylan's transition plan is for [facility].   No identifiable complex care management needs since plan is for ALF.   Nolberto Batty, MSN, RN, BSN [place]  [facility], Healthy Communities RN Post- Acute Care Manager Direct Dial: [phone]

## 2023-07-20 ENCOUNTER — Telehealth: Payer: Self-pay

## 2023-07-20 NOTE — Transitions of Care (Post Inpatient/ED Visit) (Signed)
   07/20/2023  Name: Madison White MRN: [id number] DOB: [birth year]  Today's TOC FU Call Status: Today's TOC FU Call Status:: Unsuccessful Call (1st Attempt) Unsuccessful Call (1st Attempt) Date: 07/20/23  Attempted to reach the patient regarding the most recent Inpatient/ED visit.  Follow Up Plan: Additional outreach attempts will be made to reach the patient to complete the Transitions of Care (Post Inpatient/ED visit) call.   Signature Darrall Ellison, LPN [facility] Nurse Health Advisor Direct Dial [phone]

## 2023-07-20 NOTE — Transitions of Care (Post Inpatient/ED Visit) (Signed)
 07/20/2023  Name: Madison White MRN: [id number] DOB: 1929-01-13  Today's TOC FU Call Status: Today's TOC FU Call Status:: Successful TOC FU Call Completed Unsuccessful Call (1st Attempt) Date: 07/20/23 [facility] FU Call Complete Date: 07/20/23 Patient's Name and Date of Birth confirmed.  Transition Care Management Follow-up Telephone Call Date of Discharge: 07/17/23 Discharge Facility: Other [facility]) Name of Other ([facility]) Discharge Facility: [facility] Type of Discharge: Inpatient Admission Primary Inpatient Discharge Diagnosis:: vascular dementia How have you been since you were released from the hospital?: Better Any questions or concerns?: No  Items Reviewed: Did you receive and understand the discharge instructions provided?: Yes Medications obtained,verified, and reconciled?: Yes (Medications Reviewed) Any new allergies since your discharge?: No Dietary orders reviewed?: Yes Do you have support at home?: Yes People in Home [RPT]: facility resident  Medications Reviewed Today: Medications Reviewed Today     Reviewed by Darrall Ellison, LPN (Licensed Practical Nurse) on 07/20/23 at 1233  Med List Status: <None>   Medication Order Taking? Sig Documenting Provider Last Dose Status Informant  amLODipine  (NORVASC ) 5 MG tablet [id number]  Take 1 tablet (5 mg total) by mouth daily. Modena Andes, MD  Expired 07/12/23 2359   calcium-vitamin D (OSCAL WITH D) 500-200 MG-UNIT tablet [id number] No Take 1 tablet by mouth. [provider] 06/07/2023 Active Child, Pharmacy Records  Cholecalciferol 25 MCG (1000 UT) tablet [id number] No Take 1,000 Units by mouth at bedtime. [provider] 06/06/2023 Active Child, Pharmacy Records  cyanocobalamin  100 MCG tablet [id number] No Take 100 mcg by mouth daily. [provider] 06/07/2023 Active Child, Pharmacy Records  dabigatran  (PRADAXA ) 150 MG CAPS capsule [id number]  Take 1 capsule (150 mg total) by mouth every 12  (twelve) hours. Modena Andes, MD  Expired 07/11/23 2359   donepezil  (ARICEPT ) 10 MG tablet [id number] No TAKE 1 TABLET BY MOUTH EVERYDAY AT BEDTIME  Patient not taking: Reported on 06/08/2023   Luevenia Saha, MD Not Taking Active Child, Pharmacy Records  ELDERBERRY PO [id number] No Take 1 tablet by mouth daily in the afternoon. Fleet Huh [provider] 06/07/2023 Active Child, Pharmacy Records  Iron-Vitamin C 100-250 MG TABS [id number] No Take 1 tablet by mouth at bedtime. [provider] 06/06/2023 Active Child, Pharmacy Records  metoprolol  tartrate (LOPRESSOR ) 25 MG tablet [id number] No TAKE 1 TABLET BY MOUTH TWICE A DAY  Patient taking differently: Take 12.5 mg by mouth 2 (two) times daily.   Luevenia Saha, MD 06/07/2023 Active Child, Pharmacy Records  MYRBETRIQ  25 MG TB24 tablet [id number] No TAKE 1 TABLET (25 MG TOTAL) BY MOUTH DAILY.  Patient taking differently: Take 25 mg by mouth at bedtime.   Luevenia Saha, MD 06/06/2023 Active Child, Pharmacy Records           Med Note (CRUTHIS, Lenor Raddle Jun 08, 2023  7:54 AM) Daughter is adamant the pt is still taking this medication nightly. Dispense report does not support this claim.  omeprazole  (PRILOSEC) 10 MG capsule [id number] No Take 10 mg by mouth daily. [provider] 06/07/2023 Active Child, Pharmacy Records  PEDIATRIC MULTIVITAMINS-IRON PO [id number] No Take 2 tablets by mouth daily in the afternoon. Chew gummie [provider] 06/07/2023 Active Child, Pharmacy Records            Home Care and Equipment/Supplies: Were Home Health Services Ordered?: NA Any new equipment or medical supplies ordered?: NA  Functional Questionnaire: Do you need assistance with bathing/showering or dressing?: Yes Do you need  assistance with meal preparation?: Yes Do you need assistance with eating?: No Do you have difficulty maintaining continence: No Do you need assistance with getting out of bed/getting out of a  chair/moving?: No Do you have difficulty managing or taking your medications?: Yes  Follow up appointments reviewed: PCP Follow-up appointment confirmed?: Yes Date of PCP follow-up appointment?: 07/23/23 Follow-up Provider: [facility] Follow-up appointment confirmed?: NA Do you need transportation to your follow-up appointment?: No Do you understand care options if your condition(s) worsen?: Yes-patient verbalized understanding    SIGNATURE Darrall Ellison, LPN [facility] Nurse Health Advisor Direct Dial [phone]

## 2023-07-20 NOTE — Telephone Encounter (Signed)
 Copied from CRM [id number]. Topic: Clinical - Home Health Verbal Orders >> Jul 20, 2023  2:23 PM [place] E wrote: Caller/Agency: Rice Chamorro with [facility] Callback Number: [phone] (okay to leave a VM) Service Requested: Physical Therapy Frequency: 1x / week for 4 weeks, and then every other week for 4 weeks. Any new concerns about the patient? No  LVM for Rice Chamorro to move forward with verbal orders for pt.

## 2023-07-21 ENCOUNTER — Other Ambulatory Visit: Payer: Self-pay | Admitting: Internal Medicine

## 2023-07-22 ENCOUNTER — Ambulatory Visit: Admitting: Family Medicine

## 2023-07-23 ENCOUNTER — Ambulatory Visit: Admitting: Family Medicine

## 2023-07-23 ENCOUNTER — Encounter: Payer: Self-pay | Admitting: Family Medicine

## 2023-07-23 VITALS — BP 127/71 | HR 68 | Temp 97.7°F | Ht 61.0 in | Wt 144.6 lb

## 2023-07-23 DIAGNOSIS — I10 Essential (primary) hypertension: Secondary | ICD-10-CM

## 2023-07-23 DIAGNOSIS — I69311 Memory deficit following cerebral infarction: Secondary | ICD-10-CM | POA: Diagnosis not present

## 2023-07-23 DIAGNOSIS — I63419 Cerebral infarction due to embolism of unspecified middle cerebral artery: Secondary | ICD-10-CM

## 2023-07-23 DIAGNOSIS — I69398 Other sequelae of cerebral infarction: Secondary | ICD-10-CM | POA: Diagnosis not present

## 2023-07-23 DIAGNOSIS — F01B18 Vascular dementia, moderate, with other behavioral disturbance: Secondary | ICD-10-CM

## 2023-07-23 DIAGNOSIS — I428 Other cardiomyopathies: Secondary | ICD-10-CM

## 2023-07-23 DIAGNOSIS — Z7901 Long term (current) use of anticoagulants: Secondary | ICD-10-CM

## 2023-07-23 DIAGNOSIS — I4819 Other persistent atrial fibrillation: Secondary | ICD-10-CM | POA: Diagnosis not present

## 2023-07-23 DIAGNOSIS — Z950 Presence of cardiac pacemaker: Secondary | ICD-10-CM | POA: Diagnosis not present

## 2023-07-23 DIAGNOSIS — Z95811 Presence of heart assist device: Secondary | ICD-10-CM | POA: Diagnosis not present

## 2023-07-23 LAB — COMPREHENSIVE METABOLIC PANEL WITH GFR
ALT: 9 U/L (ref 0–35)
AST: 18 U/L (ref 0–37)
Albumin: 3.8 g/dL (ref 3.5–5.2)
Alkaline Phosphatase: 40 U/L (ref 39–117)
BUN: 11 mg/dL (ref 6–23)
CO2: 30 meq/L (ref 19–32)
Calcium: 9.3 mg/dL (ref 8.4–10.5)
Chloride: 103 meq/L (ref 96–112)
Creatinine, Ser: 0.91 mg/dL (ref 0.40–1.20)
GFR: 54.73 mL/min — ABNORMAL LOW (ref >60.00)
Glucose, Bld: 85 mg/dL (ref 70–99)
Potassium: 4.2 meq/L (ref 3.5–5.1)
Sodium: 140 meq/L (ref 135–145)
Total Bilirubin: 0.4 mg/dL (ref 0.2–1.2)
Total Protein: 6.9 g/dL (ref 6.0–8.3)

## 2023-07-23 LAB — CBC WITH DIFFERENTIAL/PLATELET
Basophils Absolute: 0.1 10*3/uL (ref 0.0–0.1)
Basophils Relative: 0.8 % (ref 0.0–3.0)
Eosinophils Absolute: 0.2 10*3/uL (ref 0.0–0.7)
Eosinophils Relative: 2.1 % (ref 0.0–5.0)
HCT: 41.1 % (ref 36.0–46.0)
Hemoglobin: 13.6 g/dL (ref 12.0–15.0)
Lymphocytes Relative: 18.9 % (ref 12.0–46.0)
Lymphs Abs: 1.5 10*3/uL (ref 0.7–4.0)
MCHC: 33 g/dL (ref 30.0–36.0)
MCV: 89.9 fl (ref 78.0–100.0)
Monocytes Absolute: 0.7 10*3/uL (ref 0.1–1.0)
Monocytes Relative: 8.4 % (ref 3.0–12.0)
Neutro Abs: 5.6 10*3/uL (ref 1.4–7.7)
Neutrophils Relative %: 69.8 % (ref 43.0–77.0)
Platelets: 321 10*3/uL (ref 150.0–400.0)
RBC: 4.57 Mil/uL (ref 3.87–5.11)
RDW: 14 % (ref 11.5–15.5)
WBC: 8.1 10*3/uL (ref 4.0–10.5)

## 2023-07-23 MED ORDER — METOPROLOL TARTRATE 25 MG PO TABS: 12.5000 mg | ORAL_TABLET | Freq: Two times a day (BID) | ORAL | 3 refills | Status: AC

## 2023-07-23 MED ORDER — AMLODIPINE BESYLATE 5 MG PO TABS: 5.0000 mg | ORAL_TABLET | Freq: Every day | ORAL | 3 refills | Status: AC

## 2023-07-23 MED ORDER — MIRABEGRON ER 25 MG PO TB24: 25.0000 mg | ORAL_TABLET | Freq: Every day | ORAL | 3 refills | Status: AC

## 2023-07-23 MED ORDER — DABIGATRAN ETEXILATE MESYLATE 150 MG PO CAPS: 150.0000 mg | ORAL_CAPSULE | Freq: Two times a day (BID) | ORAL | 3 refills | Status: AC

## 2023-07-23 NOTE — Patient Instructions (Addendum)
 Please return in 6 months for follow up on chronic medical problems  If you have any questions or concerns, please don't hesitate to send me a message via MyChart or call the office at [phone]. Thank you for visiting with us  today! It's our pleasure caring for you.    VISIT SUMMARY: Today, we discussed the ongoing issues you are experiencing following your stroke, including difficulties with your left hand, memory, and vision. We also reviewed your current medications and addressed concerns about the number of medications you are taking.  YOUR PLAN: -STROKE WITH RESIDUAL EFFECTS: A stroke occurs when blood flow to a part of your brain is interrupted, causing brain cells to die. The residual effects you are experiencing include difficulties with your left hand, vision loss on one side, and memory problems. We will refer you to a neurologist to review your medications and assess your stroke prevention plan. You should continue with occupational therapy to help improve the function of your left hand. We will also review and manage your medications to ensure they are effective for stroke prevention.  INSTRUCTIONS: Please follow up with the neurologist for a medication review and stroke prevention assessment. Continue attending your occupational therapy sessions to improve your hand function.                      Contains text generated by Abridge.                                 Contains text generated by Abridge.

## 2023-07-23 NOTE — Progress Notes (Signed)
 Subjective  CC:  Chief Complaint  Patient presents with   Hypertension   Hospitalization Follow-up    Embolic stroke    HPI: ADAMARYS ANDRIES is a 89 y.o. female who presents to the office today to address the problems listed above in the chief complaint. Discussed the use of AI scribe software for clinical note transcription with the patient, who gave verbal consent to proceed.  History of Present Illness KUUIPO PETITFRERE is a 89 year old female who presents with post-stroke symptoms and medication management concerns. Reviewed discharge summary 2/205: embolic CVA w/ left sided sxs. Changed from eloquis to pradaxa . Has not had f/u with neurology. Now in assisted living.   She is experiencing ongoing issues following a stroke, particularly with her left hand, which is problematic when trying to find her sleeve as it 'goes all over the place.' She is currently receiving occupational therapy to address this issue. Additionally, her memory has been affected since the stroke, and she has noted vision loss on one side, which may be contributing to her hand coordination difficulties. No leg symptoms post-stroke.  She resides in an assisted living facility, the [id number] on [facility], where she is receiving support for her condition.  There was a change in her medication from Eliquis  to Pradaxa  after the stroke occurred while she was on a blood thinner. Her daughter is concerned about the number of medications she is on, as exceeding five medications increases costs significantly. She wants me to adjust medications so no more than 5 need to be administered.   Chronic problem f/u: last here over a year ago. Reviewed all labs. See below.  Sees cards. Pacer in place. On cp or sob. On aricept  for dementia. Did not tolerate namenda .    Assessment  1. 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 Persistent atrial fibrillation (HCC) Chronic  4. Presence of heart assist device (HCC) Chronic  5. Nonischemic cardiomyopathy (HCC) Chronic  6. Essential hypertension   7. Current use of long term anticoagulation   8. Presence of biventricular cardiac pacemaker      Plan  Assessment and Plan Assessment & Plan Stroke with residual effects Residual effects include left hand motor dysfunction, unilateral vision impairment, and memory deficits due to stroke impact on motor and vision brain regions. She switched from Eliquis  to Prudoxin for stroke prevention. - Refer to neurologist for medication review and stroke prevention assessment. - Continue occupational therapy for hand function improvement. - Review and manage medications for effective stroke prevention.  Afib on pradaxa  now. Rate controlled.no cp  Dementia: on aricept . Now slighlty worse due to cva/ mutlifactorial.   Pacer for h/o complete heart block. Recent transmission to cards stable.   Labs ok. Will recheck cmp and cbc post hospitalization to ensure stability.     Follow up: 27mo for recheck Orders Placed This Encounter  Procedures   Ambulatory referral to Neurology   Meds ordered this encounter  Medications   amLODipine  (NORVASC ) 5 MG tablet    Sig: Take 1 tablet (5 mg total) by mouth daily.    Dispense:  90 tablet    Refill:  3   dabigatran  (PRADAXA ) 150 MG CAPS capsule    Sig: Take 1 capsule (150 mg total) by mouth every 12 (twelve) hours.    Dispense:  180 capsule    Refill:  3  metoprolol  tartrate (LOPRESSOR ) 25 MG tablet    Sig: Take 0.5 tablets (12.5 mg total) by mouth 2 (two) times daily.    Dispense:  45 tablet    Refill:  3   mirabegron  ER (MYRBETRIQ ) 25 MG TB24 tablet    Sig: Take 1 tablet (25 mg total) by mouth daily.    Dispense:  90 tablet    Refill:  3     I reviewed the patients updated PMH, FH, and SocHx.  Patient Active Problem List   Diagnosis Date Noted    Long-term current use of proton pump inhibitor therapy 04/09/2022    Priority: High   Current use of long term anticoagulation 04/09/2022    Priority: High   Moderate vascular dementia with other behavioral disturbance (HCC) 06/18/2021    Priority: High   History of DVT of lower extremity left 12/27/2019    Priority: High   Persistent atrial fibrillation (HCC) 08/01/2019    Priority: High   Presence of biventricular cardiac pacemaker 04/05/2019    Priority: High   History of complete heart block 05/05/2017    Priority: High   Nonischemic cardiomyopathy (HCC) 11/17/2014    Priority: High   Dyslipidemia 09/02/2012    Priority: High   Essential hypertension 05/31/2011    Priority: High   Overactive bladder 03/19/2021    Priority: Medium    Recurrent nephrolithiasis 06/17/2017    Priority: Medium    Hydronephrosis with urinary obstruction due to renal calculus 03/01/2017    Priority: Medium    Upper airway cough syndrome 02/26/2014    Priority: Medium    Constipation, chronic 02/26/2014    Priority: Medium    GERD (gastroesophageal reflux disease) 11/19/2012    Priority: Medium    Left bundle branch block 12/18/2011    Priority: Medium    Renal cyst 03/01/2017    Priority: Low   Total knee replacement status, bilateral 11/06/2016    Priority: Low   Presence of heart assist device (HCC) 07/23/2023   AMS (altered mental status) 06/07/2023   Stroke (HCC) 06/07/2023   Seborrheic keratoses 12/03/2022   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   [DISCONTINUED] Cholecalciferol 25 MCG (1000 UT) tablet Take 1,000 Units by mouth at bedtime.   [DISCONTINUED] cyanocobalamin  100 MCG tablet Take 100 mcg by mouth daily.   [DISCONTINUED] ELDERBERRY PO Take  1 tablet by mouth daily in the afternoon. Chew Gummie   [DISCONTINUED] Iron-Vitamin C 100-250 MG TABS Take 1 tablet by mouth at bedtime.   [DISCONTINUED] metoprolol  tartrate (LOPRESSOR ) 25 MG tablet TAKE 1 TABLET BY MOUTH TWICE A DAY (Patient taking differently: Take 12.5 mg by mouth 2 (two) times daily.)   [DISCONTINUED] MYRBETRIQ  25 MG TB24 tablet TAKE 1 TABLET (25 MG TOTAL) BY MOUTH DAILY. (Patient taking differently: Take 25 mg by mouth at bedtime.)   [DISCONTINUED] omeprazole  (PRILOSEC) 10 MG capsule Take 10 mg by mouth daily.   [DISCONTINUED] PEDIATRIC MULTIVITAMINS-IRON PO Take 2 tablets by mouth daily in the afternoon. Chew gummie   Allergies: Patient is allergic to penicillins, nickel, avapro [irbesartan], lisinopril, simvastatin, latex, sulfa antibiotics, and sulfamethoxazole. Family History: Patient family history includes Cancer in her father; Diabetes in her mother; Heart disease in her father and mother; Hyperlipidemia in her  mother; Hypertension in her mother. Social History:  Patient  reports that she quit smoking about 65 years ago. Her smoking use included cigarettes. She has never used smokeless tobacco. She reports that she does not currently use alcohol after a past usage of about 1.0 standard drink of alcohol per week. She reports that she does not use drugs.  Review of Systems: Constitutional: Negative for fever malaise or anorexia Cardiovascular: negative for chest pain Respiratory: negative for SOB or persistent cough Gastrointestinal: negative for abdominal pain  Objective  Vitals: BP 127/71   Pulse 68   Temp 97.7 F (36.5 C)   Ht 5\' 1"  (1.549 m)   Wt 144 lb 9.6 oz (65.6 kg)   SpO2 95%   BMI 27.32 kg/m  General: no acute distress , pleasantly demented HEENT: PEERL, conjunctiva normal, neck is supple Cardiovascular:  RRR without murmur or gallop.  Respiratory:  Good breath sounds bilaterally, CTAB with normal respiratory effort Skin:  Warm, no  rashes Commons side effects, risks, benefits, and alternatives for medications and treatment plan prescribed today were discussed, and the patient expressed understanding of the given instructions. Patient is instructed to call or message via MyChart if he/she has any questions or concerns regarding our treatment plan. No barriers to understanding were identified. We discussed Red Flag symptoms and signs in detail. Patient expressed understanding regarding what to do in case of urgent or emergency type symptoms.  Medication list was reconciled, printed and provided to the patient in AVS. Patient instructions and summary information was reviewed with the patient as documented in the AVS. This note was prepared with assistance of Dragon voice recognition software. Occasional wrong-word or sound-a-like substitutions may have occurred due to the inherent limitations of voice recognition software

## 2023-07-25 ENCOUNTER — Encounter: Payer: Self-pay | Admitting: Family Medicine

## 2023-07-25 NOTE — Progress Notes (Signed)
 See mychart note Dear [name] and family, Your lab results are stable.  It was good seeing you. Sincerely, Dr. Jonelle Neri

## 2023-07-27 ENCOUNTER — Telehealth: Payer: Self-pay

## 2023-07-27 DIAGNOSIS — Z0279 Encounter for issue of other medical certificate: Secondary | ICD-10-CM

## 2023-07-27 NOTE — Telephone Encounter (Signed)
 Copied from CRM [id number]. Topic: General - Other >> Jul 27, 2023  3:08 PM Oddis Bench wrote: Reason for CRM: Patient daughter is calling to ck on form that she dropped of for a grant for the doctor to fill out so that her mother can get a grant from the [place] for housing. The daughter would also like to pick up a copy of the form that is sent.

## 2023-07-29 ENCOUNTER — Ambulatory Visit: Admitting: Neurology

## 2023-07-29 ENCOUNTER — Encounter: Payer: Self-pay | Admitting: Neurology

## 2023-07-29 VITALS — BP 131/71 | HR 70 | Ht 60.0 in | Wt 146.6 lb

## 2023-07-29 DIAGNOSIS — Z7901 Long term (current) use of anticoagulants: Secondary | ICD-10-CM

## 2023-07-29 DIAGNOSIS — I482 Chronic atrial fibrillation, unspecified: Secondary | ICD-10-CM | POA: Diagnosis not present

## 2023-07-29 DIAGNOSIS — F0150 Vascular dementia without behavioral disturbance: Secondary | ICD-10-CM

## 2023-07-29 DIAGNOSIS — I6340 Cerebral infarction due to embolism of unspecified cerebral artery: Secondary | ICD-10-CM | POA: Diagnosis not present

## 2023-07-29 NOTE — Progress Notes (Signed)
 [facility] [address] [place]. [place] [address] [phone]       OFFICE FOLLOW-UP NOTE  [name] Date of Birth:  18-Oct-1928 Medical Record Number:  [id number]   HPI: Madison White is a pleasant 89 year old Caucasian lady seen today for an office follow-up visit following hospital consultation for stroke.  History is obtained from the patient and her 2 daughters who are accompanying her as well as review of electronic medical records and I personally reviewed pertinent available imaging films in PACS.  She has past medical history of hypertension, hyperlipidemia, diabetes, cardiomyopathy, gastroesophageal reflux disease and lower GI bleeding.  She presented on 06/07/2023 with sudden onset left-sided weakness which she noticed when she was getting out of bed first time in the morning and fell out.  She was on Eliquis  for atrial fibrillation hence was not a candidate for thrombolysis.  NIH stroke scale on admission was 7 with left-sided weakness and some partial peripheral vision loss.  CT head on admission showed acute to subacute right parietal infarct and CT angiogram of the head and neck showed no large vessel stenosis or occlusion.  MRI scan confirmed patchy right MCA branch infarct.  2D echo showed ejection fraction of 60 to 65% with moderate concentric left ventricular hypertrophy and severe dilatation of the left atrium.  LDL cholesterol is 88 mg percent.  Hemoglobin A1c was 6.0.  Patient was on Eliquis  prior to admission and this was switched to Pradaxa  due to Eliquis  failure at discharge.  Patient was transferred to rehab in had subsequently moved into an assisted living facility.  Her left-sided strength is improving but she still needs some help with leg bathing and dressing herself.  She needs help to get up.  She can feed herself and go to the restroom by herself.  She is getting physical and occupational therapy 2 days a week.  She continues to have mild left  hand weakness and partial peripheral vision loss which is unchanged.  Family has also noticed some cognitive difficulties which are worse than her baseline.  She was already on Aricept  prior to her stroke for cognitive impairment.  ROS:   14 system review of systems is positive for bruising, memory loss, imbalance, urinary urgency, gait difficulty all other systems negative  PMH:  Past Medical History:  Diagnosis Date   Acute lower GI bleeding    CVA (cerebral vascular accident) (HCC)    Diastolic dysfunction, left ventricle    GERD (gastroesophageal reflux disease)    Headache disorder 2016   R frontoparietal pain, episodic (paroxysmal hemicrania vs trig neuralgia of ophth br of CN V)--MRI brain 11/2014 showed age related changes but no explanation for her HA's.  Neuro dx'd pt with primary stabbing HA's and she improved on neurontin .   History of blood transfusion    History of rheumatic fever    HTN (hypertension)    hx of refusing treatment--White coat HTN and/or situational HTN (?)    Hyperlipidemia    hx of refusing treatment   Insulin resistance    A1c's excellent per old records (6.1 in 2008 and 2009)   Left bundle branch block 12/2011   Dr. Alvis Ba at [facility]; ECHO and myocardial perfusion scan showed  septal and apical wall motion abnormality but she had no significant valvular disease and no ischemia.  She did have mildly decreased EF (39% on lexiscan and 50% on echo) and abnl LV relaxation.  Mild amount of PVCs.  Pt at  higher risk for other conduction abnormalities, good chance of eventually requiring a pacemaker.    LVH (left ventricular hypertrophy)    Nonischemic cardiomyopathy (HCC) 2016   LV dysfunction due to LBBB/septal dyssynchrony   Osteoarthritis    bilat knees--needs bilat TKA.  Ortho is trying steroid injections as of 10/23/15.   Presence of biventricular cardiac pacemaker 04/05/2019   Presence of permanent cardiac pacemaker 05/05/2017   Seasonal allergies     Solitary pulmonary nodule 05/2016   8 mm pleural based nodule in RLL.  Radiology recommended f/u noncontrast CT in 6-12 mo.    Social History:  Social History   Socioeconomic History   Marital status: Widowed    Spouse name: Not on file   Number of children: Not on file   Years of education: Not on file   Highest education level: Not on file  Occupational History   Occupation: retired   Tobacco Use   Smoking status: Former    Current packs/day: 0.00    Types: Cigarettes    Quit date: 05/31/1958    Years since quitting: 65.2   Smokeless tobacco: Never  Vaping Use   Vaping status: Never Used  Substance and Sexual Activity   Alcohol use: Not Currently    Alcohol/week: 1.0 standard drink of alcohol    Types: 1 Glasses of wine per week   Drug use: No   Sexual activity: Not on file  Other Topics Concern   Not on file  Social History Narrative   Widower, husband died around 39 (wrongful death per pt's report).   Originally from [place] , has been in [place] since about 1997.   Lives with daughter  Former Airline pilot and lay pastor-retired.  Enjoys Engineer, materials.   Distant hx of tobacco abuse.  No alcohol or drugs.   Social Drivers of Corporate investment banker Strain: Low Risk  (02/09/2023)   Overall Financial Resource Strain (CARDIA)    Difficulty of Paying Living Expenses: Not hard at all  Food Insecurity: No Food Insecurity (06/07/2023)   Hunger Vital Sign    Worried About Running Out of Food in the Last Year: Never true    Ran Out of Food in the Last Year: Never true  Transportation Needs: No Transportation Needs (06/07/2023)   PRAPARE - Administrator, Civil Service (Medical): No    Lack of Transportation (Non-Medical): No  Physical Activity: Inactive (02/09/2023)   Exercise Vital Sign    Days of Exercise per Week: 0 days    Minutes of Exercise per Session: 0 min  Stress: No Stress Concern Present (02/09/2023)   [employer] of Occupational  Health - Occupational Stress Questionnaire    Feeling of Stress : Not at all  Social Connections: Socially Isolated (06/07/2023)   Social Connection and Isolation Panel [NHANES]    Frequency of Communication with Friends and Family: More than three times a week    Frequency of Social Gatherings with Friends and Family: More than three times a week    Attends Religious Services: Never    Database administrator or Organizations: No    Attends Banker Meetings: Never    Marital Status: Widowed  Intimate Partner Violence: Not At Risk (06/07/2023)   Humiliation, Afraid, Rape, and Kick questionnaire    Fear of Current or Ex-Partner: No    Emotionally Abused: No    Physically Abused: No    Sexually Abused: No    Medications:  Current Outpatient Medications on File Prior to Visit  Medication Sig Dispense Refill   amLODipine  (NORVASC ) 5 MG tablet Take 1 tablet (5 mg total) by mouth daily. 90 tablet 3   dabigatran  (PRADAXA ) 150 MG CAPS capsule Take 1 capsule (150 mg total) by mouth every 12 (twelve) hours. 180 capsule 3   donepezil  (ARICEPT ) 10 MG tablet TAKE 1 TABLET BY MOUTH EVERYDAY AT BEDTIME 90 tablet 3   Elderberry-Vitamin C-Zinc (ELDERBERRY IMMUNE HEALTH GUMMY PO) Take by mouth.     metoprolol  tartrate (LOPRESSOR ) 25 MG tablet Take 0.5 tablets (12.5 mg total) by mouth 2 (two) times daily. 45 tablet 3   mirabegron  ER (MYRBETRIQ ) 25 MG TB24 tablet Take 1 tablet (25 mg total) by mouth daily. 90 tablet 3   Multiple Vitamins-Minerals (MULTI-VITAMIN GUMMIES PO) Take by mouth. With Iron     vitamin B-12 (CYANOCOBALAMIN ) 100 MCG tablet Take 100 mcg by mouth daily.     No current facility-administered medications on file prior to visit.    Allergies:   Allergies  Allergen Reactions   Penicillins Hives and Swelling   Nickel Swelling and Rash   Avapro [Irbesartan] Other (See Comments)    Unknown rxn   Lisinopril Swelling    Swelling around eyes; also says it caused increased  sugar and BP   Simvastatin Other (See Comments)    unknown   Latex Rash   Sulfa Antibiotics Rash   Sulfamethoxazole Rash    Physical Exam General: well developed, well nourished, seated, in no evident distress Head: head normocephalic and atraumatic.  Neck: supple with no carotid or supraclavicular bruits Cardiovascular: regular rate and rhythm, no murmurs Musculoskeletal: no deformity Skin:  no rash/petichiae Vascular:  Normal pulses all extremities Vitals:   07/29/23 1455  BP: 131/71  Pulse: 70   Neurologic Exam Mental Status: Awake and fully alert. Oriented to place and time. Recent and remote memory intact. Attention span, concentration and fund of knowledge appropriate. Mood and affect appropriate.  Diminished recall 0/3.  Able to name 13 animals which can walk on 4 legs.  Clock drawing 3/4. Cranial Nerves: Fundoscopic exam reveals sharp disc margins. Pupils equal, briskly reactive to light. Extraocular movements full without nystagmus. Visual fields show partial left hemianopsia to confrontation. Hearing diminished bilaterally. Facial sensation intact.  Mild left lower facial asymmetry., tongue, palate moves normally and symmetrically.  Motor: Normal bulk and tone. Normal strength in all tested extremity muscles except mild weakness of left grip and intrinsics.  Orbits right over left upper extremity.  Mild weakness of left ankle dorsiflexors.. Sensory.: intact to touch ,pinprick .position and vibratory sensation.  Coordination: Rapid alternating movements normal in all extremities. Finger-to-nose and heel-to-shin performed accurately bilaterally. Gait and Station: Arises from chair with  difficulty. Stance is stooped . Gait cautious and slightly broad-based with dragging of the left leg.  Unable to walk tandem Reflexes: 1+ and symmetric. Toes downgoing.   NIHSS  3 Modified Rankin  3   ASSESSMENT: 89 year old Caucasian lady with embolic right MCA branch infarct in March 2025  with residual mild left hemiparesis and underlying moderate vascular dementia.     PLAN: I had a long d/w patient and her 2 daughters about her recent embolic stroke, atrial fibrillation and vascular dementia risk for recurrent stroke/TIAs, personally independently reviewed imaging studies and stroke evaluation results and answered questions.Continue Pradaxa  (dabigatran ) twice a day  for secondary stroke prevention and maintain strict control of hypertension with blood pressure goal below 130/90, diabetes with hemoglobin  A1c goal below 6.5% and lipids with LDL cholesterol goal below 70 mg/dL. I also advised the patient to eat a healthy diet with plenty of whole grains, cereals, fruits and vegetables, exercise regularly and maintain ideal body weight .continue Aricept  for her dementia.  Continue ongoing physical and Occupational Therapy.  She was advised to use a walker at all times.  We also discussed fall prevention precautions may consider adding Namenda  future visits if she has further cognitive worsening.  Followup in the future with me in 6 months or call earlier if necessary. Greater than 50% of time during this 40 minute visit was spent on counseling,explanation of diagnosis, planning of further management, discussion with patient and family and coordination of care Madison Beaver, MD Note: This document was prepared with digital dictation and possible smart phrase technology. Any transcriptional errors that result from this process are unintentional

## 2023-07-29 NOTE — Patient Instructions (Signed)
 I had a long d/w patient and her 2 daughters about her recent embolic stroke, atrial fibrillation and vascular dementia risk for recurrent stroke/TIAs, personally independently reviewed imaging studies and stroke evaluation results and answered questions.Continue Pradaxa  (dabigatran ) twice a day  for secondary stroke prevention and maintain strict control of hypertension with blood pressure goal below 130/90, diabetes with hemoglobin A1c goal below 6.5% and lipids with LDL cholesterol goal below 70 mg/dL. I also advised the patient to eat a healthy diet with plenty of whole grains, cereals, fruits and vegetables, exercise regularly and maintain ideal body weight .continue Aricept  for her dementia.  Continue ongoing physical and Occupational Therapy.  She was advised to use a walker at all times.  We also discussed fall prevention precautions may consider adding Namenda  future visits if she has further cognitive worsening.  Followup in the future with me in 6 months or call earlier if necessary.  Fall Prevention in the Home, Adult Falls can cause injuries and affect people of all ages. There are many simple things that you can do to make your home safe and to help prevent falls. If you need it, ask for help making these changes. What actions can I take to prevent falls? General information Use good lighting in all rooms. Make sure to: Replace any light bulbs that burn out. Turn on lights if it is dark and use night-lights. Keep items that you use often in easy-to-reach places. Lower the shelves around your home if needed. Move furniture so that there are clear paths around it. Do not keep throw rugs or other things on the floor that can make you trip. If any of your floors are uneven, fix them. Add color or contrast paint or tape to clearly mark and help you see: Grab bars or handrails. First and last steps of staircases. Where the edge of each step is. If you use a ladder or stepladder: Make sure  that it is fully opened. Do not climb a closed ladder. Make sure the sides of the ladder are locked in place. Have someone hold the ladder while you use it. Know where your pets are as you move through your home. What can I do in the bathroom?     Keep the floor dry. Clean up any water that is on the floor right away. Remove soap buildup in the bathtub or shower. Buildup makes bathtubs and showers slippery. Use non-skid mats or decals on the floor of the bathtub or shower. Attach bath mats securely with double-sided, non-slip rug tape. If you need to sit down while you are in the shower, use a non-slip stool. Install grab bars by the toilet and in the bathtub and shower. Do not use towel bars as grab bars. What can I do in the bedroom? Make sure that you have a light by your bed that is easy to reach. Do not use any sheets or blankets on your bed that hang to the floor. Have a firm bench or chair with side arms that you can use for support when you get dressed. What can I do in the kitchen? Clean up any spills right away. If you need to reach something above you, use a sturdy step stool that has a grab bar. Keep electrical cables out of the way. Do not use floor polish or wax that makes floors slippery. What can I do with my stairs? Do not leave anything on the stairs. Make sure that you have a light switch at  the top and the bottom of the stairs. Have them installed if you do not have them. Make sure that there are handrails on both sides of the stairs. Fix handrails that are broken or loose. Make sure that handrails are as long as the staircases. Install non-slip stair treads on all stairs in your home if they do not have carpet. Avoid having throw rugs at the top or bottom of stairs, or secure the rugs with carpet tape to prevent them from moving. Choose a carpet design that does not hide the edge of steps on the stairs. Make sure that carpet is firmly attached to the stairs. Fix any  carpet that is loose or worn. What can I do on the outside of my home? Use bright outdoor lighting. Repair the edges of walkways and driveways and fix any cracks. Clear paths of anything that can make you trip, such as tools or rocks. Add color or contrast paint or tape to clearly mark and help you see high doorway thresholds. Trim any bushes or trees on the main path into your home. Check that handrails are securely fastened and in good repair. Both sides of all steps should have handrails. Install guardrails along the edges of any raised decks or porches. Have leaves, snow, and ice cleared regularly. Use sand, salt, or ice melt on walkways during winter months if you live where there is ice and snow. In the garage, clean up any spills right away, including grease or oil spills. What other actions can I take? Review your medicines with your health care provider. Some medicines can make you confused or feel dizzy. This can increase your chance of falling. Wear closed-toe shoes that fit well and support your feet. Wear shoes that have rubber soles and low heels. Use a cane, walker, scooter, or crutches that help you move around if needed. Talk with your provider about other ways that you can decrease your risk of falls. This may include seeing a physical therapist to learn to do exercises to improve movement and strength. Where to find more information Centers for Disease Control and Prevention, STEADI: TonerPromos.no [employer] on Aging: BaseRingTones.pl National Institute on Aging: BaseRingTones.pl Contact a health care provider if: You are afraid of falling at home. You feel weak, drowsy, or dizzy at home. You fall at home. Get help right away if you: Lose consciousness or have trouble moving after a fall. Have a fall that causes a head injury. These symptoms may be an emergency. Get help right away. Call 911. Do not wait to see if the symptoms will go away. Do not drive yourself to the  hospital. This information is not intended to replace advice given to you by your health care provider. Make sure you discuss any questions you have with your health care provider. Document Revised: 11/04/2021 Document Reviewed: 11/04/2021 Elsevier Patient Education  2024 [employer].

## 2023-07-30 DIAGNOSIS — I69391 Dysphagia following cerebral infarction: Secondary | ICD-10-CM | POA: Diagnosis not present

## 2023-07-30 DIAGNOSIS — I69318 Other symptoms and signs involving cognitive functions following cerebral infarction: Secondary | ICD-10-CM | POA: Diagnosis not present

## 2023-07-30 DIAGNOSIS — F0150 Vascular dementia without behavioral disturbance: Secondary | ICD-10-CM | POA: Diagnosis not present

## 2023-08-03 ENCOUNTER — Ambulatory Visit (INDEPENDENT_AMBULATORY_CARE_PROVIDER_SITE_OTHER): Payer: Medicare HMO

## 2023-08-03 DIAGNOSIS — I442 Atrioventricular block, complete: Secondary | ICD-10-CM

## 2023-08-04 ENCOUNTER — Ambulatory Visit: Payer: Self-pay | Admitting: Internal Medicine

## 2023-08-04 LAB — CUP PACEART REMOTE DEVICE CHECK
Battery Remaining Longevity: 17 mo
Battery Remaining Percentage: 18 %
Battery Voltage: 2.9 V
Date Time Interrogation Session: 20250519020012
Implantable Lead Connection Status: 753985
Implantable Lead Connection Status: 753985
Implantable Lead Connection Status: 753985
Implantable Lead Implant Date: 20190219
Implantable Lead Implant Date: 20190219
Implantable Lead Implant Date: 20190219
Implantable Lead Location: 753858
Implantable Lead Location: 753859
Implantable Lead Location: 753860
Implantable Pulse Generator Implant Date: 20190219
Lead Channel Impedance Value: 580 Ohm
Lead Channel Impedance Value: 640 Ohm
Lead Channel Pacing Threshold Amplitude: 0.625 V
Lead Channel Pacing Threshold Amplitude: 1.375 V
Lead Channel Pacing Threshold Pulse Width: 0.5 ms
Lead Channel Pacing Threshold Pulse Width: 1 ms
Lead Channel Sensing Intrinsic Amplitude: 12 mV
Lead Channel Setting Pacing Amplitude: 2 V
Lead Channel Setting Pacing Amplitude: 2 V
Lead Channel Setting Pacing Pulse Width: 0.5 ms
Lead Channel Setting Pacing Pulse Width: 1 ms
Lead Channel Setting Sensing Sensitivity: 4 mV
Pulse Gen Model: 3262
Pulse Gen Serial Number: 8995296

## 2023-08-07 DIAGNOSIS — I69318 Other symptoms and signs involving cognitive functions following cerebral infarction: Secondary | ICD-10-CM | POA: Diagnosis not present

## 2023-08-07 DIAGNOSIS — F0150 Vascular dementia without behavioral disturbance: Secondary | ICD-10-CM | POA: Diagnosis not present

## 2023-08-07 DIAGNOSIS — F0280 Dementia in other diseases classified elsewhere without behavioral disturbance: Secondary | ICD-10-CM | POA: Diagnosis not present

## 2023-08-07 DIAGNOSIS — K219 Gastro-esophageal reflux disease without esophagitis: Secondary | ICD-10-CM | POA: Diagnosis not present

## 2023-08-07 DIAGNOSIS — N3281 Overactive bladder: Secondary | ICD-10-CM | POA: Diagnosis not present

## 2023-08-07 DIAGNOSIS — I10 Essential (primary) hypertension: Secondary | ICD-10-CM | POA: Diagnosis not present

## 2023-08-19 DIAGNOSIS — Z1329 Encounter for screening for other suspected endocrine disorder: Secondary | ICD-10-CM | POA: Diagnosis not present

## 2023-08-19 DIAGNOSIS — E785 Hyperlipidemia, unspecified: Secondary | ICD-10-CM | POA: Diagnosis not present

## 2023-08-19 DIAGNOSIS — I10 Essential (primary) hypertension: Secondary | ICD-10-CM | POA: Diagnosis not present

## 2023-08-21 DIAGNOSIS — N3281 Overactive bladder: Secondary | ICD-10-CM | POA: Diagnosis not present

## 2023-09-03 DIAGNOSIS — M1990 Unspecified osteoarthritis, unspecified site: Secondary | ICD-10-CM | POA: Diagnosis not present

## 2023-09-03 DIAGNOSIS — I10 Essential (primary) hypertension: Secondary | ICD-10-CM | POA: Diagnosis not present

## 2023-09-04 DIAGNOSIS — I10 Essential (primary) hypertension: Secondary | ICD-10-CM | POA: Diagnosis not present

## 2023-09-04 DIAGNOSIS — K219 Gastro-esophageal reflux disease without esophagitis: Secondary | ICD-10-CM | POA: Diagnosis not present

## 2023-09-04 DIAGNOSIS — R197 Diarrhea, unspecified: Secondary | ICD-10-CM | POA: Diagnosis not present

## 2023-09-22 NOTE — Progress Notes (Signed)
 Remote pacemaker transmission.

## 2023-09-22 NOTE — Addendum Note (Signed)
 Addended by: TAWNI DRILLING D on: 09/22/2023 11:32 AM   Modules accepted: Orders

## 2023-10-09 DIAGNOSIS — I10 Essential (primary) hypertension: Secondary | ICD-10-CM | POA: Diagnosis not present

## 2023-10-09 DIAGNOSIS — R42 Dizziness and giddiness: Secondary | ICD-10-CM | POA: Diagnosis not present

## 2023-10-09 DIAGNOSIS — K219 Gastro-esophageal reflux disease without esophagitis: Secondary | ICD-10-CM | POA: Diagnosis not present

## 2023-10-13 DIAGNOSIS — I10 Essential (primary) hypertension: Secondary | ICD-10-CM | POA: Diagnosis not present

## 2023-10-13 DIAGNOSIS — M1990 Unspecified osteoarthritis, unspecified site: Secondary | ICD-10-CM | POA: Diagnosis not present

## 2023-10-16 DIAGNOSIS — I10 Essential (primary) hypertension: Secondary | ICD-10-CM | POA: Diagnosis not present

## 2023-10-16 DIAGNOSIS — K219 Gastro-esophageal reflux disease without esophagitis: Secondary | ICD-10-CM | POA: Diagnosis not present

## 2023-10-16 DIAGNOSIS — R42 Dizziness and giddiness: Secondary | ICD-10-CM | POA: Diagnosis not present

## 2023-10-22 DIAGNOSIS — K219 Gastro-esophageal reflux disease without esophagitis: Secondary | ICD-10-CM | POA: Diagnosis not present

## 2023-10-22 DIAGNOSIS — I10 Essential (primary) hypertension: Secondary | ICD-10-CM | POA: Diagnosis not present

## 2023-10-22 DIAGNOSIS — R42 Dizziness and giddiness: Secondary | ICD-10-CM | POA: Diagnosis not present

## 2023-11-02 ENCOUNTER — Ambulatory Visit: Payer: Medicare HMO | Attending: Internal Medicine

## 2023-11-02 DIAGNOSIS — I442 Atrioventricular block, complete: Secondary | ICD-10-CM

## 2023-11-04 LAB — CUP PACEART REMOTE DEVICE CHECK
Battery Remaining Longevity: 13 mo
Battery Remaining Percentage: 15 %
Battery Voltage: 2.89 V
Date Time Interrogation Session: 20250818020016
Implantable Lead Connection Status: 753985
Implantable Lead Connection Status: 753985
Implantable Lead Connection Status: 753985
Implantable Lead Implant Date: 20190219
Implantable Lead Implant Date: 20190219
Implantable Lead Implant Date: 20190219
Implantable Lead Location: 753858
Implantable Lead Location: 753859
Implantable Lead Location: 753860
Implantable Pulse Generator Implant Date: 20190219
Lead Channel Impedance Value: 590 Ohm
Lead Channel Impedance Value: 690 Ohm
Lead Channel Pacing Threshold Amplitude: 0.625 V
Lead Channel Pacing Threshold Amplitude: 1.875 V
Lead Channel Pacing Threshold Pulse Width: 0.5 ms
Lead Channel Pacing Threshold Pulse Width: 1 ms
Lead Channel Sensing Intrinsic Amplitude: 12 mV
Lead Channel Setting Pacing Amplitude: 2 V
Lead Channel Setting Pacing Amplitude: 2.375
Lead Channel Setting Pacing Pulse Width: 0.5 ms
Lead Channel Setting Pacing Pulse Width: 1 ms
Lead Channel Setting Sensing Sensitivity: 4 mV
Pulse Gen Model: 3262
Pulse Gen Serial Number: 8995296

## 2023-11-07 DIAGNOSIS — N3281 Overactive bladder: Secondary | ICD-10-CM | POA: Diagnosis not present

## 2023-11-08 ENCOUNTER — Ambulatory Visit: Payer: Self-pay | Admitting: Internal Medicine

## 2023-11-12 DIAGNOSIS — M1990 Unspecified osteoarthritis, unspecified site: Secondary | ICD-10-CM | POA: Diagnosis not present

## 2023-11-12 DIAGNOSIS — I10 Essential (primary) hypertension: Secondary | ICD-10-CM | POA: Diagnosis not present

## 2023-11-13 DIAGNOSIS — F0150 Vascular dementia without behavioral disturbance: Secondary | ICD-10-CM | POA: Diagnosis not present

## 2023-11-13 DIAGNOSIS — N390 Urinary tract infection, site not specified: Secondary | ICD-10-CM | POA: Diagnosis not present

## 2023-11-13 DIAGNOSIS — N3281 Overactive bladder: Secondary | ICD-10-CM | POA: Diagnosis not present

## 2023-12-04 DIAGNOSIS — F0150 Vascular dementia without behavioral disturbance: Secondary | ICD-10-CM | POA: Diagnosis not present

## 2023-12-04 DIAGNOSIS — R519 Headache, unspecified: Secondary | ICD-10-CM | POA: Diagnosis not present

## 2023-12-04 DIAGNOSIS — N3281 Overactive bladder: Secondary | ICD-10-CM | POA: Diagnosis not present

## 2023-12-04 DIAGNOSIS — I69318 Other symptoms and signs involving cognitive functions following cerebral infarction: Secondary | ICD-10-CM | POA: Diagnosis not present

## 2023-12-04 DIAGNOSIS — F0280 Dementia in other diseases classified elsewhere without behavioral disturbance: Secondary | ICD-10-CM | POA: Diagnosis not present

## 2023-12-05 DIAGNOSIS — M1990 Unspecified osteoarthritis, unspecified site: Secondary | ICD-10-CM | POA: Diagnosis not present

## 2023-12-05 DIAGNOSIS — I10 Essential (primary) hypertension: Secondary | ICD-10-CM | POA: Diagnosis not present

## 2023-12-07 DIAGNOSIS — N3281 Overactive bladder: Secondary | ICD-10-CM | POA: Diagnosis not present

## 2023-12-07 DIAGNOSIS — E782 Mixed hyperlipidemia: Secondary | ICD-10-CM | POA: Diagnosis not present

## 2023-12-07 DIAGNOSIS — E8881 Metabolic syndrome: Secondary | ICD-10-CM | POA: Diagnosis not present

## 2023-12-07 DIAGNOSIS — Z8673 Personal history of transient ischemic attack (TIA), and cerebral infarction without residual deficits: Secondary | ICD-10-CM | POA: Diagnosis not present

## 2023-12-07 DIAGNOSIS — I10 Essential (primary) hypertension: Secondary | ICD-10-CM | POA: Diagnosis not present

## 2023-12-07 DIAGNOSIS — F01A0 Vascular dementia, mild, without behavioral disturbance, psychotic disturbance, mood disturbance, and anxiety: Secondary | ICD-10-CM | POA: Diagnosis not present

## 2023-12-07 DIAGNOSIS — D508 Other iron deficiency anemias: Secondary | ICD-10-CM | POA: Diagnosis not present

## 2023-12-09 NOTE — Progress Notes (Signed)
 Remote PPM Transmission

## 2024-01-08 DIAGNOSIS — G4709 Other insomnia: Secondary | ICD-10-CM | POA: Diagnosis not present

## 2024-01-08 DIAGNOSIS — N3281 Overactive bladder: Secondary | ICD-10-CM | POA: Diagnosis not present

## 2024-01-08 DIAGNOSIS — Z8673 Personal history of transient ischemic attack (TIA), and cerebral infarction without residual deficits: Secondary | ICD-10-CM | POA: Diagnosis not present

## 2024-01-08 DIAGNOSIS — F01A0 Vascular dementia, mild, without behavioral disturbance, psychotic disturbance, mood disturbance, and anxiety: Secondary | ICD-10-CM | POA: Diagnosis not present

## 2024-01-18 DIAGNOSIS — F0150 Vascular dementia without behavioral disturbance: Secondary | ICD-10-CM | POA: Diagnosis not present

## 2024-01-18 DIAGNOSIS — R278 Other lack of coordination: Secondary | ICD-10-CM | POA: Diagnosis not present

## 2024-01-18 DIAGNOSIS — R531 Weakness: Secondary | ICD-10-CM | POA: Diagnosis not present

## 2024-01-20 DIAGNOSIS — R531 Weakness: Secondary | ICD-10-CM | POA: Diagnosis not present

## 2024-01-20 DIAGNOSIS — R278 Other lack of coordination: Secondary | ICD-10-CM | POA: Diagnosis not present

## 2024-01-20 DIAGNOSIS — I442 Atrioventricular block, complete: Secondary | ICD-10-CM | POA: Diagnosis not present

## 2024-01-20 DIAGNOSIS — F0150 Vascular dementia without behavioral disturbance: Secondary | ICD-10-CM | POA: Diagnosis not present

## 2024-01-20 DIAGNOSIS — I10 Essential (primary) hypertension: Secondary | ICD-10-CM | POA: Diagnosis not present

## 2024-01-22 DIAGNOSIS — N3281 Overactive bladder: Secondary | ICD-10-CM | POA: Diagnosis not present

## 2024-01-22 DIAGNOSIS — R051 Acute cough: Secondary | ICD-10-CM | POA: Diagnosis not present

## 2024-01-22 DIAGNOSIS — F01A0 Vascular dementia, mild, without behavioral disturbance, psychotic disturbance, mood disturbance, and anxiety: Secondary | ICD-10-CM | POA: Diagnosis not present

## 2024-01-26 ENCOUNTER — Telehealth: Payer: Self-pay | Admitting: Neurology

## 2024-01-26 ENCOUNTER — Ambulatory Visit: Admitting: Family Medicine

## 2024-01-26 DIAGNOSIS — I10 Essential (primary) hypertension: Secondary | ICD-10-CM | POA: Diagnosis not present

## 2024-01-26 DIAGNOSIS — R278 Other lack of coordination: Secondary | ICD-10-CM | POA: Diagnosis not present

## 2024-01-26 DIAGNOSIS — F0150 Vascular dementia without behavioral disturbance: Secondary | ICD-10-CM | POA: Diagnosis not present

## 2024-01-26 DIAGNOSIS — I442 Atrioventricular block, complete: Secondary | ICD-10-CM | POA: Diagnosis not present

## 2024-01-26 DIAGNOSIS — R531 Weakness: Secondary | ICD-10-CM | POA: Diagnosis not present

## 2024-01-26 NOTE — Telephone Encounter (Signed)
 Pt daughter called to Cancel appt Pt is in facility and not sure when she will get out   Appt Canceled

## 2024-01-27 ENCOUNTER — Ambulatory Visit: Admitting: Neurology

## 2024-01-28 DIAGNOSIS — R278 Other lack of coordination: Secondary | ICD-10-CM | POA: Diagnosis not present

## 2024-01-28 DIAGNOSIS — F0150 Vascular dementia without behavioral disturbance: Secondary | ICD-10-CM | POA: Diagnosis not present

## 2024-01-28 DIAGNOSIS — R531 Weakness: Secondary | ICD-10-CM | POA: Diagnosis not present

## 2024-02-01 ENCOUNTER — Ambulatory Visit: Payer: Medicare HMO

## 2024-02-01 DIAGNOSIS — I428 Other cardiomyopathies: Secondary | ICD-10-CM

## 2024-02-02 DIAGNOSIS — R531 Weakness: Secondary | ICD-10-CM | POA: Diagnosis not present

## 2024-02-02 DIAGNOSIS — F0150 Vascular dementia without behavioral disturbance: Secondary | ICD-10-CM | POA: Diagnosis not present

## 2024-02-02 DIAGNOSIS — I10 Essential (primary) hypertension: Secondary | ICD-10-CM | POA: Diagnosis not present

## 2024-02-02 DIAGNOSIS — I442 Atrioventricular block, complete: Secondary | ICD-10-CM | POA: Diagnosis not present

## 2024-02-02 DIAGNOSIS — R278 Other lack of coordination: Secondary | ICD-10-CM | POA: Diagnosis not present

## 2024-02-02 LAB — CUP PACEART REMOTE DEVICE CHECK
Battery Remaining Longevity: 11 mo
Battery Remaining Percentage: 12 %
Battery Voltage: 2.86 V
Date Time Interrogation Session: 20251117020014
Implantable Lead Connection Status: 753985
Implantable Lead Connection Status: 753985
Implantable Lead Connection Status: 753985
Implantable Lead Implant Date: 20190219
Implantable Lead Implant Date: 20190219
Implantable Lead Implant Date: 20190219
Implantable Lead Location: 753858
Implantable Lead Location: 753859
Implantable Lead Location: 753860
Implantable Pulse Generator Implant Date: 20190219
Lead Channel Impedance Value: 610 Ohm
Lead Channel Impedance Value: 660 Ohm
Lead Channel Pacing Threshold Amplitude: 0.625 V
Lead Channel Pacing Threshold Amplitude: 2.125 V
Lead Channel Pacing Threshold Pulse Width: 0.5 ms
Lead Channel Pacing Threshold Pulse Width: 1 ms
Lead Channel Sensing Intrinsic Amplitude: 12 mV
Lead Channel Setting Pacing Amplitude: 2 V
Lead Channel Setting Pacing Amplitude: 2.625
Lead Channel Setting Pacing Pulse Width: 0.5 ms
Lead Channel Setting Pacing Pulse Width: 1 ms
Lead Channel Setting Sensing Sensitivity: 4 mV
Pulse Gen Model: 3262
Pulse Gen Serial Number: 8995296

## 2024-02-03 ENCOUNTER — Ambulatory Visit: Payer: Self-pay | Admitting: Internal Medicine

## 2024-02-03 NOTE — Progress Notes (Signed)
 Remote PPM Transmission

## 2024-02-05 DIAGNOSIS — I10 Essential (primary) hypertension: Secondary | ICD-10-CM | POA: Diagnosis not present

## 2024-02-05 DIAGNOSIS — E782 Mixed hyperlipidemia: Secondary | ICD-10-CM | POA: Diagnosis not present

## 2024-02-05 DIAGNOSIS — R531 Weakness: Secondary | ICD-10-CM | POA: Diagnosis not present

## 2024-02-05 DIAGNOSIS — R278 Other lack of coordination: Secondary | ICD-10-CM | POA: Diagnosis not present

## 2024-02-05 DIAGNOSIS — F0150 Vascular dementia without behavioral disturbance: Secondary | ICD-10-CM | POA: Diagnosis not present

## 2024-02-09 DIAGNOSIS — I442 Atrioventricular block, complete: Secondary | ICD-10-CM | POA: Diagnosis not present

## 2024-02-09 DIAGNOSIS — R278 Other lack of coordination: Secondary | ICD-10-CM | POA: Diagnosis not present

## 2024-02-09 DIAGNOSIS — F0150 Vascular dementia without behavioral disturbance: Secondary | ICD-10-CM | POA: Diagnosis not present

## 2024-02-09 DIAGNOSIS — I10 Essential (primary) hypertension: Secondary | ICD-10-CM | POA: Diagnosis not present

## 2024-02-10 DIAGNOSIS — F0150 Vascular dementia without behavioral disturbance: Secondary | ICD-10-CM | POA: Diagnosis not present

## 2024-02-10 DIAGNOSIS — R531 Weakness: Secondary | ICD-10-CM | POA: Diagnosis not present

## 2024-02-10 DIAGNOSIS — R278 Other lack of coordination: Secondary | ICD-10-CM | POA: Diagnosis not present

## 2024-02-16 DIAGNOSIS — F0150 Vascular dementia without behavioral disturbance: Secondary | ICD-10-CM | POA: Diagnosis not present

## 2024-02-16 DIAGNOSIS — R278 Other lack of coordination: Secondary | ICD-10-CM | POA: Diagnosis not present

## 2024-02-16 DIAGNOSIS — R531 Weakness: Secondary | ICD-10-CM | POA: Diagnosis not present

## 2024-02-18 DIAGNOSIS — I10 Essential (primary) hypertension: Secondary | ICD-10-CM | POA: Diagnosis not present

## 2024-02-18 DIAGNOSIS — F01A0 Vascular dementia, mild, without behavioral disturbance, psychotic disturbance, mood disturbance, and anxiety: Secondary | ICD-10-CM | POA: Diagnosis not present

## 2024-02-18 DIAGNOSIS — N3281 Overactive bladder: Secondary | ICD-10-CM | POA: Diagnosis not present

## 2024-02-19 DIAGNOSIS — R531 Weakness: Secondary | ICD-10-CM | POA: Diagnosis not present

## 2024-02-19 DIAGNOSIS — R278 Other lack of coordination: Secondary | ICD-10-CM | POA: Diagnosis not present

## 2024-02-19 DIAGNOSIS — I442 Atrioventricular block, complete: Secondary | ICD-10-CM | POA: Diagnosis not present

## 2024-02-19 DIAGNOSIS — F0150 Vascular dementia without behavioral disturbance: Secondary | ICD-10-CM | POA: Diagnosis not present

## 2024-02-19 DIAGNOSIS — I10 Essential (primary) hypertension: Secondary | ICD-10-CM | POA: Diagnosis not present

## 2024-02-23 DIAGNOSIS — F0150 Vascular dementia without behavioral disturbance: Secondary | ICD-10-CM | POA: Diagnosis not present

## 2024-02-23 DIAGNOSIS — I10 Essential (primary) hypertension: Secondary | ICD-10-CM | POA: Diagnosis not present

## 2024-02-23 DIAGNOSIS — R278 Other lack of coordination: Secondary | ICD-10-CM | POA: Diagnosis not present

## 2024-02-23 DIAGNOSIS — I442 Atrioventricular block, complete: Secondary | ICD-10-CM | POA: Diagnosis not present

## 2024-02-23 DIAGNOSIS — R531 Weakness: Secondary | ICD-10-CM | POA: Diagnosis not present

## 2024-02-29 DIAGNOSIS — R531 Weakness: Secondary | ICD-10-CM | POA: Diagnosis not present

## 2024-02-29 DIAGNOSIS — R443 Hallucinations, unspecified: Secondary | ICD-10-CM | POA: Diagnosis not present

## 2024-02-29 DIAGNOSIS — I442 Atrioventricular block, complete: Secondary | ICD-10-CM | POA: Diagnosis not present

## 2024-02-29 DIAGNOSIS — I6930 Unspecified sequelae of cerebral infarction: Secondary | ICD-10-CM | POA: Diagnosis not present

## 2024-02-29 DIAGNOSIS — F01A0 Vascular dementia, mild, without behavioral disturbance, psychotic disturbance, mood disturbance, and anxiety: Secondary | ICD-10-CM | POA: Diagnosis not present

## 2024-02-29 DIAGNOSIS — R278 Other lack of coordination: Secondary | ICD-10-CM | POA: Diagnosis not present

## 2024-02-29 DIAGNOSIS — F0150 Vascular dementia without behavioral disturbance: Secondary | ICD-10-CM | POA: Diagnosis not present

## 2024-02-29 DIAGNOSIS — I10 Essential (primary) hypertension: Secondary | ICD-10-CM | POA: Diagnosis not present

## 2024-02-29 DIAGNOSIS — G4709 Other insomnia: Secondary | ICD-10-CM | POA: Diagnosis not present

## 2024-03-01 DIAGNOSIS — R109 Unspecified abdominal pain: Secondary | ICD-10-CM | POA: Diagnosis not present

## 2024-03-02 DIAGNOSIS — N390 Urinary tract infection, site not specified: Secondary | ICD-10-CM | POA: Diagnosis not present

## 2024-03-07 DIAGNOSIS — R278 Other lack of coordination: Secondary | ICD-10-CM | POA: Diagnosis not present

## 2024-03-07 DIAGNOSIS — R531 Weakness: Secondary | ICD-10-CM | POA: Diagnosis not present

## 2024-03-07 DIAGNOSIS — F0150 Vascular dementia without behavioral disturbance: Secondary | ICD-10-CM | POA: Diagnosis not present

## 2024-03-11 DIAGNOSIS — I442 Atrioventricular block, complete: Secondary | ICD-10-CM | POA: Diagnosis not present

## 2024-03-11 DIAGNOSIS — I10 Essential (primary) hypertension: Secondary | ICD-10-CM | POA: Diagnosis not present

## 2024-03-11 DIAGNOSIS — F0150 Vascular dementia without behavioral disturbance: Secondary | ICD-10-CM | POA: Diagnosis not present

## 2024-03-11 DIAGNOSIS — R278 Other lack of coordination: Secondary | ICD-10-CM | POA: Diagnosis not present

## 2024-03-11 DIAGNOSIS — R531 Weakness: Secondary | ICD-10-CM | POA: Diagnosis not present

## 2024-03-14 DIAGNOSIS — I10 Essential (primary) hypertension: Secondary | ICD-10-CM | POA: Diagnosis not present

## 2024-03-14 DIAGNOSIS — R531 Weakness: Secondary | ICD-10-CM | POA: Diagnosis not present

## 2024-03-14 DIAGNOSIS — R278 Other lack of coordination: Secondary | ICD-10-CM | POA: Diagnosis not present

## 2024-03-14 DIAGNOSIS — F0150 Vascular dementia without behavioral disturbance: Secondary | ICD-10-CM | POA: Diagnosis not present

## 2024-03-14 DIAGNOSIS — I442 Atrioventricular block, complete: Secondary | ICD-10-CM | POA: Diagnosis not present

## 2024-04-08 ENCOUNTER — Telehealth: Payer: Self-pay | Admitting: Family Medicine

## 2024-04-08 NOTE — Telephone Encounter (Signed)
 Left vm for [facility] stating we have not received any paperwork as of yet.   Copied from CRM [id number]. Topic: General - Other >> Apr 08, 2024 10:12 AM Deleta RAMAN wrote: Reason for CRM: Hildegard from [facility] is calling to see if fax was received for the patient. Please contact [phone] option 2
